# Patient Record
Sex: Female | Born: 1935
Health system: Southern US, Community
[De-identification: ages and names within clinical notes are randomized; demographics above are authoritative.]

## PROBLEM LIST (undated history)

## (undated) DIAGNOSIS — Z5189 Encounter for other specified aftercare: Secondary | ICD-10-CM

## (undated) DIAGNOSIS — F0280 Dementia in other diseases classified elsewhere without behavioral disturbance: Secondary | ICD-10-CM

## (undated) DIAGNOSIS — H409 Unspecified glaucoma: Secondary | ICD-10-CM

## (undated) DIAGNOSIS — I739 Peripheral vascular disease, unspecified: Secondary | ICD-10-CM

## (undated) DIAGNOSIS — I82409 Acute embolism and thrombosis of unspecified deep veins of unspecified lower extremity: Secondary | ICD-10-CM

## (undated) DIAGNOSIS — N189 Chronic kidney disease, unspecified: Secondary | ICD-10-CM

## (undated) DIAGNOSIS — K8689 Other specified diseases of pancreas: Secondary | ICD-10-CM

## (undated) DIAGNOSIS — E785 Hyperlipidemia, unspecified: Secondary | ICD-10-CM

## (undated) DIAGNOSIS — K5790 Diverticulosis of intestine, part unspecified, without perforation or abscess without bleeding: Secondary | ICD-10-CM

## (undated) DIAGNOSIS — K219 Gastro-esophageal reflux disease without esophagitis: Secondary | ICD-10-CM

## (undated) DIAGNOSIS — M1990 Unspecified osteoarthritis, unspecified site: Secondary | ICD-10-CM

## (undated) DIAGNOSIS — D126 Benign neoplasm of colon, unspecified: Secondary | ICD-10-CM

## (undated) DIAGNOSIS — K449 Diaphragmatic hernia without obstruction or gangrene: Secondary | ICD-10-CM

## (undated) DIAGNOSIS — J45909 Unspecified asthma, uncomplicated: Secondary | ICD-10-CM

## (undated) DIAGNOSIS — R918 Other nonspecific abnormal finding of lung field: Secondary | ICD-10-CM

## (undated) DIAGNOSIS — K222 Esophageal obstruction: Secondary | ICD-10-CM

## (undated) DIAGNOSIS — G309 Alzheimer's disease, unspecified: Secondary | ICD-10-CM

## (undated) DIAGNOSIS — D649 Anemia, unspecified: Secondary | ICD-10-CM

## (undated) DIAGNOSIS — I10 Essential (primary) hypertension: Secondary | ICD-10-CM

## (undated) HISTORY — DX: Other specified diseases of pancreas: K86.89

## (undated) HISTORY — DX: Anemia, unspecified: D64.9

## (undated) HISTORY — PX: CATARACT EXTRACTION, BILATERAL: SHX1313

## (undated) HISTORY — DX: Encounter for other specified aftercare: Z51.89

## (undated) HISTORY — DX: Alzheimer's disease, unspecified: G30.9

## (undated) HISTORY — PX: EYE SURGERY: SHX253

## (undated) HISTORY — DX: Gastro-esophageal reflux disease without esophagitis: K21.9

## (undated) HISTORY — DX: Esophageal obstruction: K22.2

## (undated) HISTORY — DX: Diverticulosis of intestine, part unspecified, without perforation or abscess without bleeding: K57.90

## (undated) HISTORY — DX: Chronic kidney disease, unspecified: N18.9

## (undated) HISTORY — DX: Unspecified osteoarthritis, unspecified site: M19.90

## (undated) HISTORY — DX: Other nonspecific abnormal finding of lung field: R91.8

## (undated) HISTORY — DX: Acute embolism and thrombosis of unspecified deep veins of unspecified lower extremity: I82.409

## (undated) HISTORY — DX: Dementia in other diseases classified elsewhere, unspecified severity, without behavioral disturbance, psychotic disturbance, mood disturbance, and anxiety: F02.80

## (undated) HISTORY — DX: Peripheral vascular disease, unspecified: I73.9

## (undated) HISTORY — PX: WRIST FRACTURE SURGERY: SHX121

## (undated) HISTORY — DX: Essential (primary) hypertension: I10

## (undated) HISTORY — DX: Benign neoplasm of colon, unspecified: D12.6

## (undated) HISTORY — DX: Diaphragmatic hernia without obstruction or gangrene: K44.9

## (undated) HISTORY — DX: Hyperlipidemia, unspecified: E78.5

---

## 2003-12-18 ENCOUNTER — Inpatient Hospital Stay (HOSPITAL_COMMUNITY): Admission: AD | Admit: 2003-12-18 | Discharge: 2004-01-04 | Payer: Self-pay | Admitting: Sports Medicine

## 2004-01-04 ENCOUNTER — Inpatient Hospital Stay (HOSPITAL_COMMUNITY)
Admission: RE | Admit: 2004-01-04 | Discharge: 2004-01-20 | Payer: Self-pay | Admitting: Physical Medicine & Rehabilitation

## 2004-01-09 ENCOUNTER — Encounter: Admission: RE | Admit: 2004-01-09 | Discharge: 2004-01-09 | Payer: Self-pay | Admitting: Family Medicine

## 2004-01-24 ENCOUNTER — Encounter
Admission: RE | Admit: 2004-01-24 | Discharge: 2004-04-23 | Payer: Self-pay | Admitting: Physical Medicine & Rehabilitation

## 2004-02-03 ENCOUNTER — Encounter: Admission: RE | Admit: 2004-02-03 | Discharge: 2004-02-03 | Payer: Self-pay | Admitting: Sports Medicine

## 2004-02-10 ENCOUNTER — Encounter: Admission: RE | Admit: 2004-02-10 | Discharge: 2004-02-10 | Payer: Self-pay | Admitting: Sports Medicine

## 2004-02-19 ENCOUNTER — Encounter (INDEPENDENT_AMBULATORY_CARE_PROVIDER_SITE_OTHER): Payer: Self-pay | Admitting: *Deleted

## 2004-02-21 ENCOUNTER — Encounter: Admission: RE | Admit: 2004-02-21 | Discharge: 2004-05-21 | Payer: Self-pay | Admitting: Family Medicine

## 2004-02-21 ENCOUNTER — Encounter
Admission: RE | Admit: 2004-02-21 | Discharge: 2004-05-21 | Payer: Self-pay | Admitting: Physical Medicine & Rehabilitation

## 2004-03-12 ENCOUNTER — Encounter: Admission: RE | Admit: 2004-03-12 | Discharge: 2004-03-12 | Payer: Self-pay | Admitting: Family Medicine

## 2004-04-13 ENCOUNTER — Encounter
Admission: RE | Admit: 2004-04-13 | Discharge: 2004-04-13 | Payer: Self-pay | Admitting: Physical Medicine & Rehabilitation

## 2004-04-24 ENCOUNTER — Encounter
Admission: RE | Admit: 2004-04-24 | Discharge: 2004-07-23 | Payer: Self-pay | Admitting: Physical Medicine & Rehabilitation

## 2004-05-07 ENCOUNTER — Encounter (HOSPITAL_BASED_OUTPATIENT_CLINIC_OR_DEPARTMENT_OTHER): Admission: RE | Admit: 2004-05-07 | Discharge: 2004-08-05 | Payer: Self-pay | Admitting: Internal Medicine

## 2004-05-29 ENCOUNTER — Encounter
Admission: RE | Admit: 2004-05-29 | Discharge: 2004-06-29 | Payer: Self-pay | Admitting: Physical Medicine & Rehabilitation

## 2004-06-29 ENCOUNTER — Encounter
Admission: RE | Admit: 2004-06-29 | Discharge: 2004-08-30 | Payer: Self-pay | Admitting: Physical Medicine & Rehabilitation

## 2004-07-03 ENCOUNTER — Ambulatory Visit: Payer: Self-pay | Admitting: Physical Medicine & Rehabilitation

## 2004-07-05 ENCOUNTER — Encounter (HOSPITAL_BASED_OUTPATIENT_CLINIC_OR_DEPARTMENT_OTHER): Admission: RE | Admit: 2004-07-05 | Discharge: 2004-09-28 | Payer: Self-pay | Admitting: Internal Medicine

## 2004-07-30 ENCOUNTER — Ambulatory Visit: Payer: Self-pay | Admitting: Family Medicine

## 2004-08-09 ENCOUNTER — Encounter (HOSPITAL_BASED_OUTPATIENT_CLINIC_OR_DEPARTMENT_OTHER): Admission: RE | Admit: 2004-08-09 | Discharge: 2004-11-07 | Payer: Self-pay | Admitting: Internal Medicine

## 2004-08-29 ENCOUNTER — Ambulatory Visit: Payer: Self-pay | Admitting: Family Medicine

## 2004-09-17 ENCOUNTER — Ambulatory Visit (HOSPITAL_COMMUNITY): Admission: RE | Admit: 2004-09-17 | Discharge: 2004-09-17 | Payer: Self-pay

## 2004-09-20 HISTORY — PX: VASCULAR SURGERY: SHX849

## 2004-10-04 ENCOUNTER — Inpatient Hospital Stay (HOSPITAL_COMMUNITY): Admission: RE | Admit: 2004-10-04 | Discharge: 2004-10-11 | Payer: Self-pay | Admitting: Vascular Surgery

## 2004-11-27 ENCOUNTER — Ambulatory Visit: Payer: Self-pay | Admitting: Sports Medicine

## 2005-03-21 DIAGNOSIS — I82409 Acute embolism and thrombosis of unspecified deep veins of unspecified lower extremity: Secondary | ICD-10-CM

## 2005-03-21 HISTORY — DX: Acute embolism and thrombosis of unspecified deep veins of unspecified lower extremity: I82.409

## 2005-04-16 ENCOUNTER — Ambulatory Visit: Payer: Self-pay | Admitting: Sports Medicine

## 2005-04-17 ENCOUNTER — Inpatient Hospital Stay (HOSPITAL_COMMUNITY): Admission: RE | Admit: 2005-04-17 | Discharge: 2005-04-20 | Payer: Self-pay | Admitting: Vascular Surgery

## 2005-08-26 ENCOUNTER — Ambulatory Visit: Payer: Self-pay | Admitting: Family Medicine

## 2005-10-01 ENCOUNTER — Encounter: Admission: RE | Admit: 2005-10-01 | Discharge: 2005-10-01 | Payer: Self-pay | Admitting: Sports Medicine

## 2006-01-16 ENCOUNTER — Ambulatory Visit: Payer: Self-pay | Admitting: Family Medicine

## 2006-02-06 ENCOUNTER — Ambulatory Visit: Payer: Self-pay | Admitting: Family Medicine

## 2006-04-08 ENCOUNTER — Ambulatory Visit: Payer: Self-pay | Admitting: Sports Medicine

## 2006-04-11 ENCOUNTER — Ambulatory Visit: Payer: Self-pay | Admitting: Family Medicine

## 2006-04-16 ENCOUNTER — Ambulatory Visit: Payer: Self-pay | Admitting: Family Medicine

## 2006-05-14 ENCOUNTER — Ambulatory Visit: Payer: Self-pay | Admitting: Sports Medicine

## 2006-07-07 ENCOUNTER — Encounter: Admission: RE | Admit: 2006-07-07 | Discharge: 2006-07-07 | Payer: Self-pay | Admitting: Family Medicine

## 2006-07-09 ENCOUNTER — Ambulatory Visit: Payer: Self-pay | Admitting: Family Medicine

## 2006-07-23 ENCOUNTER — Ambulatory Visit: Payer: Self-pay | Admitting: Family Medicine

## 2006-09-22 ENCOUNTER — Ambulatory Visit: Payer: Self-pay | Admitting: Sports Medicine

## 2006-11-26 ENCOUNTER — Encounter: Admission: RE | Admit: 2006-11-26 | Discharge: 2006-11-26 | Payer: Self-pay | Admitting: Sports Medicine

## 2006-12-12 ENCOUNTER — Encounter (INDEPENDENT_AMBULATORY_CARE_PROVIDER_SITE_OTHER): Payer: Self-pay | Admitting: Family Medicine

## 2006-12-18 DIAGNOSIS — M479 Spondylosis, unspecified: Secondary | ICD-10-CM | POA: Insufficient documentation

## 2006-12-18 DIAGNOSIS — E1159 Type 2 diabetes mellitus with other circulatory complications: Secondary | ICD-10-CM

## 2006-12-18 DIAGNOSIS — I10 Essential (primary) hypertension: Secondary | ICD-10-CM

## 2006-12-18 DIAGNOSIS — I70219 Atherosclerosis of native arteries of extremities with intermittent claudication, unspecified extremity: Secondary | ICD-10-CM

## 2006-12-18 DIAGNOSIS — E119 Type 2 diabetes mellitus without complications: Secondary | ICD-10-CM

## 2006-12-18 DIAGNOSIS — N19 Unspecified kidney failure: Secondary | ICD-10-CM | POA: Insufficient documentation

## 2006-12-19 ENCOUNTER — Encounter (INDEPENDENT_AMBULATORY_CARE_PROVIDER_SITE_OTHER): Payer: Self-pay | Admitting: *Deleted

## 2007-01-02 ENCOUNTER — Ambulatory Visit (HOSPITAL_BASED_OUTPATIENT_CLINIC_OR_DEPARTMENT_OTHER): Admission: RE | Admit: 2007-01-02 | Discharge: 2007-01-02 | Payer: Self-pay | Admitting: Urology

## 2007-01-29 ENCOUNTER — Ambulatory Visit: Payer: Self-pay | Admitting: Family Medicine

## 2007-03-10 ENCOUNTER — Telehealth (INDEPENDENT_AMBULATORY_CARE_PROVIDER_SITE_OTHER): Payer: Self-pay | Admitting: Family Medicine

## 2007-04-13 ENCOUNTER — Ambulatory Visit: Payer: Self-pay | Admitting: Sports Medicine

## 2007-04-13 DIAGNOSIS — I872 Venous insufficiency (chronic) (peripheral): Secondary | ICD-10-CM | POA: Insufficient documentation

## 2007-04-14 DIAGNOSIS — R062 Wheezing: Secondary | ICD-10-CM

## 2007-04-15 ENCOUNTER — Ambulatory Visit: Payer: Self-pay | Admitting: Vascular Surgery

## 2007-04-30 ENCOUNTER — Encounter (INDEPENDENT_AMBULATORY_CARE_PROVIDER_SITE_OTHER): Payer: Self-pay | Admitting: Family Medicine

## 2007-07-20 ENCOUNTER — Ambulatory Visit: Payer: Self-pay | Admitting: Sports Medicine

## 2007-07-20 DIAGNOSIS — R1310 Dysphagia, unspecified: Secondary | ICD-10-CM | POA: Insufficient documentation

## 2007-07-20 DIAGNOSIS — M25559 Pain in unspecified hip: Secondary | ICD-10-CM

## 2007-07-20 DIAGNOSIS — R05 Cough: Secondary | ICD-10-CM

## 2007-08-25 ENCOUNTER — Ambulatory Visit: Payer: Self-pay | Admitting: Vascular Surgery

## 2007-09-27 ENCOUNTER — Emergency Department (HOSPITAL_COMMUNITY): Admission: EM | Admit: 2007-09-27 | Discharge: 2007-09-27 | Payer: Self-pay | Admitting: Emergency Medicine

## 2007-09-29 ENCOUNTER — Inpatient Hospital Stay (HOSPITAL_COMMUNITY): Admission: RE | Admit: 2007-09-29 | Discharge: 2007-09-30 | Payer: Self-pay | Admitting: Orthopedic Surgery

## 2007-12-08 ENCOUNTER — Ambulatory Visit: Payer: Self-pay | Admitting: Vascular Surgery

## 2008-03-21 ENCOUNTER — Telehealth: Payer: Self-pay | Admitting: *Deleted

## 2008-05-24 ENCOUNTER — Ambulatory Visit: Payer: Self-pay | Admitting: Vascular Surgery

## 2008-05-25 ENCOUNTER — Encounter (INDEPENDENT_AMBULATORY_CARE_PROVIDER_SITE_OTHER): Payer: Self-pay | Admitting: Family Medicine

## 2008-06-23 ENCOUNTER — Telehealth (INDEPENDENT_AMBULATORY_CARE_PROVIDER_SITE_OTHER): Payer: Self-pay | Admitting: Family Medicine

## 2008-08-18 ENCOUNTER — Encounter: Admission: RE | Admit: 2008-08-18 | Discharge: 2008-08-18 | Payer: Self-pay | Admitting: Family Medicine

## 2008-08-31 ENCOUNTER — Encounter: Admission: RE | Admit: 2008-08-31 | Discharge: 2008-08-31 | Payer: Self-pay | Admitting: Family Medicine

## 2008-11-16 ENCOUNTER — Encounter: Admission: RE | Admit: 2008-11-16 | Discharge: 2008-11-16 | Payer: Self-pay | Admitting: Family Medicine

## 2008-11-16 ENCOUNTER — Other Ambulatory Visit: Admission: RE | Admit: 2008-11-16 | Discharge: 2008-11-16 | Payer: Self-pay | Admitting: Interventional Radiology

## 2008-11-16 ENCOUNTER — Encounter (INDEPENDENT_AMBULATORY_CARE_PROVIDER_SITE_OTHER): Payer: Self-pay | Admitting: Interventional Radiology

## 2008-11-29 ENCOUNTER — Ambulatory Visit: Payer: Self-pay | Admitting: Vascular Surgery

## 2009-03-06 ENCOUNTER — Encounter: Admission: RE | Admit: 2009-03-06 | Discharge: 2009-03-06 | Payer: Self-pay | Admitting: Family Medicine

## 2009-03-10 ENCOUNTER — Encounter: Admission: RE | Admit: 2009-03-10 | Discharge: 2009-03-10 | Payer: Self-pay | Admitting: Family Medicine

## 2009-05-30 ENCOUNTER — Ambulatory Visit: Payer: Self-pay | Admitting: Vascular Surgery

## 2009-07-18 ENCOUNTER — Inpatient Hospital Stay (HOSPITAL_COMMUNITY): Admission: EM | Admit: 2009-07-18 | Discharge: 2009-07-20 | Payer: Self-pay | Admitting: Emergency Medicine

## 2010-08-22 ENCOUNTER — Encounter: Payer: Self-pay | Admitting: Emergency Medicine

## 2010-09-27 ENCOUNTER — Ambulatory Visit: Payer: Self-pay | Admitting: Emergency Medicine

## 2010-09-27 DIAGNOSIS — J45909 Unspecified asthma, uncomplicated: Secondary | ICD-10-CM

## 2010-10-30 ENCOUNTER — Ambulatory Visit
Admission: RE | Admit: 2010-10-30 | Discharge: 2010-10-30 | Payer: Self-pay | Source: Home / Self Care | Attending: Emergency Medicine | Admitting: Emergency Medicine

## 2010-10-30 ENCOUNTER — Encounter: Payer: Self-pay | Admitting: Emergency Medicine

## 2010-10-30 DIAGNOSIS — J309 Allergic rhinitis, unspecified: Secondary | ICD-10-CM | POA: Insufficient documentation

## 2010-11-12 ENCOUNTER — Encounter: Payer: Self-pay | Admitting: Family Medicine

## 2010-11-20 NOTE — Assessment & Plan Note (Signed)
Summary: asthma, cough   Visit Type:  Initial Consult Copy to:  Dr. Nadyne Coombes Primary Provider/Referring Provider:  Dr. Nadyne Coombes  CC:  Asthma.  History of Present Illness: 77 yo woman, never smoker, DM, CKD Stage 3, HTN. Dx with childhood asthma, not on scheduled meds at that time. Improved and then returned as a young adult. She has always been on as needed therapies, has not required scheduled meds. Over the last year or so, she has had more congestion, wheeze, cough, no real assoc dyspnea. Phlegm was light colored. Overall energy level a bit down, usually very active. Tearful today when she talks about being limited. Her only inhaler was Primatine mist. Saw Dr Hal Hope and started pulmicort + ProAir. Not on allergy regimen.    Triggers: feathers, pollen, leaves, smoke.   Current Medications (verified): 1)  Aspirin 325 Mg Tabs (Aspirin) .Marland Kitchen.. 1 Once Daily 2)  Norvasc 5 Mg Tabs (Amlodipine Besylate) .... One Daily 3)  Tricor 48 Mg Tabs (Fenofibrate) .... Take 1 Tablet By Mouth Once A Day 4)  Amaryl 1 Mg Tabs (Glimepiride) .Marland Kitchen.. 1 Two Times A Day 5)  Calcium Plus Vitamin D3 600-500 Mg-Unit Caps (Calcium Carb-Cholecalciferol) .Marland Kitchen.. 1 Once Daily 6)  Multivital   Tabs (Multiple Vitamins-Minerals) .Marland Kitchen.. 1 Once Daily 7)  Benazepril Hcl 40 Mg  Tabs (Benazepril Hcl) .... 1/2 Two Times A Day 8)  Pulmicort Flexhaler 180 Mcg/act Aepb (Budesonide) .Marland Kitchen.. 1 Puff At Bedtime 9)  Famotidine 20 Mg Tabs (Famotidine) .Marland Kitchen.. 1 Two Times A Day As Needed 10)  Benzonatate 100 Mg Caps (Benzonatate) .Marland Kitchen.. 1 Three Times A Day As Needed 11)  Metoprolol Succinate 50 Mg Xr24h-Tab (Metoprolol Succinate) .Marland Kitchen.. 1 Once Daily 12)  Insta-Glucose 40 % Gel (Dextrose (Diabetic Use)) .... As Directed As Needed 13)  Vitamin C 1000 Mg Tabs (Ascorbic Acid) .... 2 Once Daily 14)  Albuterol Sulfate (2.5 Mg/52ml) 0.083% Nebu (Albuterol Sulfate) .Marland Kitchen.. 1 Vial in Nebulizer Once Daily As Needed  Allergies (verified): No Known Drug  Allergies  Past History:  Past Medical History: Last updated: 07/20/2007 hydronephrosis, referred to nephrology 11/2006 & urology - Per urology, no stents needed. Pt followed by Dr Kathrene Bongo for likely diabetic nephropathy and possibly renal vascular disease.  Left DM retinopathy/ Bil posterior vitreous detach Neisseria bacteremia  3/05 Pulmonary nodule  4/05 --> FU CT 2007 showed stable appearance of all nodules (likely scar tissues) SCr = 1.84 (04/2006), 1.46 (07/2006), 1.5 (09/2006) venous insufficiency  Family History: Last updated: 04/13/2007 Pt adopted and unsure of fam hx testicular CA- son  Past Surgical History: Reviewed history from 12/18/2006 and no changes required. Head CT - 12/20/2003, MRI of C, T, & L-spine - 12/20/2003, pelvic U/s - 03/12/2004, R Fem-tib bypass (revision 7/06) - 09/20/2004  Family History: Reviewed history from 04/13/2007 and no changes required. Pt adopted and unsure of fam hx testicular CA- son  Social History: Reviewed history from 07/20/2007 and no changes required. Married. 4 children, 11 grandchildren. Husband is owner of [employer].  Very active with family.  Out of town a lot; goes to her different homes in town and in [place]. Never smoker No ETOH  Vital Signs:  Patient profile:   77 year old female Weight:      148 pounds BMI:     24.91 O2 Sat:      94 % on Room air Temp:     97.9 degrees F oral Pulse rate:   62 / minute BP sitting:  124 / 64  (left arm)  Vitals Entered By: Vernie Murders (September 27, 2010 11:34 AM)  O2 Flow:  Room air  Physical Exam  General:  normal appearance, healthy appearing, and thin.   Head:  normocephalic and atraumatic Eyes:  conjunctiva and sclera clear Nose:  no deformity, discharge, inflammation, or lesions Mouth:  crowded post pharynx, some erythema Neck:  no masses, thyromegaly, or abnormal cervical nodes Lungs:  clear bilaterally to auscultation and percussion Heart:  regular rate  and rhythm, S1, S2 without murmurs, rubs, gallops, or clicks Abdomen:  not examined Extremities:  1+ ankle edema Neurologic:  non-focal Psych:  alert and cooperative; normal mood and affect; normal attention span and concentration, a bit forgetful w history giving   Impression & Recommendations:  Problem # 1:  ASTHMA, UNSPECIFIED, UNSPECIFIED STATUS (ICD-493.90) Longstanding mild intermittant asthma, has been more bothersome for last yr esp last few months. Biggest symptom if cough, mucous. ? whether this is her asthma vs UA irritation. She clinically improved when pulmicort added, has not needed ProAir - continue pulmicort for now, may be able to peel off if we address exacerbating factors espec her allergies.  - spirometry on stable regimen next time - as needed ProAir  Problem # 2:  COUGH (ICD-786.2)  Again, ? her asthma or her upper airway. Possible contributors - allergies and PND, benazepril. She didn';t report GERD.  - rx allergies w loratadine and nasonex, follow signs - ? whether we can peel off the pulmicort if we manage the allergies more agressively  Orders: Consultation Level IV (01027)  Medications Added to Medication List This Visit: 1)  Aspirin 325 Mg Tabs (Aspirin) .Marland Kitchen.. 1 once daily 2)  Amaryl 1 Mg Tabs (Glimepiride) .Marland Kitchen.. 1 two times a day 3)  Calcium Plus Vitamin D3 600-500 Mg-unit Caps (Calcium carb-cholecalciferol) .Marland Kitchen.. 1 once daily 4)  Multivital Tabs (Multiple vitamins-minerals) .Marland Kitchen.. 1 once daily 5)  Benazepril Hcl 40 Mg Tabs (Benazepril hcl) .... 1/2 two times a day 6)  Pulmicort Flexhaler 180 Mcg/act Aepb (Budesonide) .Marland Kitchen.. 1 puff at bedtime 7)  Famotidine 20 Mg Tabs (Famotidine) .Marland Kitchen.. 1 two times a day as needed 8)  Benzonatate 100 Mg Caps (Benzonatate) .Marland Kitchen.. 1 three times a day as needed 9)  Metoprolol Succinate 50 Mg Xr24h-tab (Metoprolol succinate) .Marland Kitchen.. 1 once daily 10)  Insta-glucose 40 % Gel (Dextrose (diabetic use)) .... As directed as needed 11)   Vitamin C 1000 Mg Tabs (Ascorbic acid) .... 2 once daily 12)  Albuterol Sulfate (2.5 Mg/59ml) 0.083% Nebu (Albuterol sulfate) .Marland Kitchen.. 1 vial in nebulizer once daily as needed 13)  Loratadine 10 Mg Tabs (Loratadine) .Marland Kitchen.. 1 by mouth once daily  Patient Instructions: 1)  Continue your Pulmicort once daily. we may decide to stop this at some point in the future 2)  Have ProAir available to use as needed  3)  Start loratadine 10mg  by mouth once daily (Claritin) 4)  Start Nasonex 2 sprays each nostril once daily  5)  Follow up with Dr Delton Coombes in 1 month. We will perform in-office spirometry at that time.  Prescriptions: LORATADINE 10 MG TABS (LORATADINE) 1 by mouth once daily  #30 x 11   Entered and Authorized by:   Leslye Peer MD   Signed by:   Leslye Peer MD on 09/27/2010   Method used:   Electronically to        [facility]* (retail)       [address]  [place], [place]  [id number]       Ph: [phone]       Fax: [phone]   RxID:   [id number]

## 2010-11-22 NOTE — Assessment & Plan Note (Signed)
Summary: asthma, allergies   Visit Type:  Follow-up Copy to:  Dr. Nadyne Coombes Primary Provider/Referring Provider:  Dr. Nadyne Coombes  CC:  Asthma.Marland KitchenMarland KitchenCough...patient says her breathing has improved since on Pulmicort.  History of Present Illness: 77 yo woman, never smoker, DM, CKD Stage 3, HTN. Dx with childhood asthma, not on scheduled meds at that time. Improved and then returned as a young adult. She has always been on as needed therapies, has not required scheduled meds. Over the last year or so, she has had more congestion, wheeze, cough, no real assoc dyspnea. Phlegm was light colored. Overall energy level a bit down, usually very active. Tearful today when she talks about being limited. Her only inhaler was Primatine mist. Saw Dr Hal Hope and started pulmicort + ProAir. Not on allergy regimen.  Still on benezapril  Triggers: feathers, pollen, leaves, smoke.   ROV 10/30/10 -- follow up for asthma. She has been started on Pulmicort prior to last time. We started loratadine and nasonex last visit. Her allergy symptoms are better, less drainage and cough. FEV1 1.0 L today  Preventive Screening-Counseling & Management  Alcohol-Tobacco     Smoking Status: never  Current Medications (verified): 1)  Aspirin 325 Mg Tabs (Aspirin) .Marland Kitchen.. 1 Once Daily 2)  Norvasc 5 Mg Tabs (Amlodipine Besylate) .... One Daily 3)  Tricor 48 Mg Tabs (Fenofibrate) .... Take 1 Tablet By Mouth Once A Day 4)  Amaryl 1 Mg Tabs (Glimepiride) .Marland Kitchen.. 1 Two Times A Day 5)  Calcium Plus Vitamin D3 600-500 Mg-Unit Caps (Calcium Carb-Cholecalciferol) .Marland Kitchen.. 1 Once Daily 6)  Multivital   Tabs (Multiple Vitamins-Minerals) .Marland Kitchen.. 1 Once Daily 7)  Benazepril Hcl 40 Mg  Tabs (Benazepril Hcl) .... 1/2 Two Times A Day 8)  Pulmicort Flexhaler 180 Mcg/act Aepb (Budesonide) .Marland Kitchen.. 1 Puff At Bedtime 9)  Famotidine 20 Mg Tabs (Famotidine) .Marland Kitchen.. 1 Two Times A Day As Needed 10)  Benzonatate 100 Mg Caps (Benzonatate) .Marland Kitchen.. 1 Three Times A Day As  Needed 11)  Metoprolol Succinate 50 Mg Xr24h-Tab (Metoprolol Succinate) .Marland Kitchen.. 1 Once Daily 12)  Insta-Glucose 40 % Gel (Dextrose (Diabetic Use)) .... As Directed As Needed 13)  Vitamin C 1000 Mg Tabs (Ascorbic Acid) .... 2 Once Daily 14)  Albuterol Sulfate (2.5 Mg/33ml) 0.083% Nebu (Albuterol Sulfate) .Marland Kitchen.. 1 Vial in Nebulizer Once Daily As Needed 15)  Loratadine 10 Mg Tabs (Loratadine) .Marland Kitchen.. 1 By Mouth Once Daily  Allergies (verified): No Known Drug Allergies  Vital Signs:  Patient profile:   77 year old female Height:      64.75 inches (164.47 cm) Weight:      149 pounds (67.73 kg) BMI:     25.08 O2 Sat:      96 % on Room air Temp:     97.4 degrees F (36.33 degrees C) oral Pulse rate:   74 / minute BP sitting:   120 / 78  (left arm) Cuff size:   regular  Vitals Entered By: Michel Bickers CMA (October 30, 2010 2:42 PM)  O2 Sat at Rest %:  96 O2 Flow:  Room air CC: Asthma.Marland KitchenMarland KitchenCough...patient says her breathing has improved since on Pulmicort Comments Medications reviewed with patient Michel Bickers CMA  October 30, 2010 2:42 PM   Physical Exam  General:  normal appearance, healthy appearing, and thin.   Head:  normocephalic and atraumatic Eyes:  conjunctiva and sclera clear Nose:  no deformity, discharge, inflammation, or lesions Mouth:  crowded post pharynx, some erythema Neck:  no masses, thyromegaly,  or abnormal cervical nodes Lungs:  clear bilaterally to auscultation, she does have very soft wheeze on forced expiration Heart:  regular rate and rhythm, S1, S2 without murmurs, rubs, gallops, or clicks Abdomen:  not examined Extremities:  1+ ankle edema Neurologic:  non-focal Psych:  alert and cooperative; normal mood and affect; normal attention span and concentration, a bit forgetful w history giving   Impression & Recommendations:  Problem # 1:  ASTHMA, UNSPECIFIED, UNSPECIFIED STATUS (ICD-493.90)  Problem # 2:  ALLERGIC RHINITIS (ICD-477.9)  Her updated medication list  for this problem includes:    Loratadine 10 Mg Tabs (Loratadine) .Marland Kitchen... 1 by mouth once daily  Orders: Est. Patient Level IV (47829)  Patient Instructions: 1)  Continue your Pulmicort 1 puff at bedtime  2)  Have your ProAir available to use as needed  3)  Stop your Nasonex 4)  Continue the loratadine for th enext month, then try stopping it. If your allergy symptoms return then restart the medication. You may need both loratadine and Nasonex during the Spring and Fall months.  5)  Follow up with Dr Delton Coombes in 1 yr or as needed

## 2011-01-25 LAB — URINALYSIS, ROUTINE W REFLEX MICROSCOPIC
Glucose, UA: >1000 mg/dL — AB
Glucose, UA: >1000 mg/dL — AB
Hgb urine dipstick: NEGATIVE
Ketones, ur: NEGATIVE mg/dL
Ketones, ur: NEGATIVE mg/dL
Nitrite: POSITIVE — AB
Protein, ur: 30 mg/dL — AB
Protein, ur: NEGATIVE mg/dL
Specific Gravity, Urine: 1.021 (ref 1.005–1.030)
pH: 5.5 (ref 5.0–8.0)

## 2011-01-25 LAB — LACTIC ACID, PLASMA: Lactic Acid, Venous: 1.7 mmol/L (ref 0.5–2.2)

## 2011-01-25 LAB — CBC
HCT: 26.8 % — ABNORMAL LOW (ref 36.0–46.0)
HCT: 33.8 % — ABNORMAL LOW (ref 36.0–46.0)
Hemoglobin: 11.3 g/dL — ABNORMAL LOW (ref 12.0–15.0)
Hemoglobin: 8.9 g/dL — ABNORMAL LOW (ref 12.0–15.0)
MCHC: 33.4 g/dL (ref 30.0–36.0)
MCHC: 33.5 g/dL (ref 30.0–36.0)
Platelets: 342 10*3/uL (ref 150–400)
RBC: 3.49 MIL/uL — ABNORMAL LOW (ref 3.87–5.11)
RDW: 12.9 % (ref 11.5–15.5)

## 2011-01-25 LAB — COMPREHENSIVE METABOLIC PANEL
ALT: 20 U/L (ref 0–35)
BUN: 31 mg/dL — ABNORMAL HIGH (ref 6–23)
Calcium: 9.3 mg/dL (ref 8.4–10.5)
Chloride: 99 mEq/L (ref 96–112)
GFR calc non Af Amer: 20 mL/min — ABNORMAL LOW (ref >60)
Potassium: 4.7 mEq/L (ref 3.5–5.1)
Sodium: 131 mEq/L — ABNORMAL LOW (ref 135–145)
Total Bilirubin: 0.8 mg/dL (ref 0.3–1.2)
Total Protein: 6.7 g/dL (ref 6.0–8.3)

## 2011-01-25 LAB — GLUCOSE, CAPILLARY
Glucose-Capillary: 127 mg/dL — ABNORMAL HIGH (ref 70–99)
Glucose-Capillary: 155 mg/dL — ABNORMAL HIGH (ref 70–99)
Glucose-Capillary: 220 mg/dL — ABNORMAL HIGH (ref 70–99)
Glucose-Capillary: 271 mg/dL — ABNORMAL HIGH (ref 70–99)
Glucose-Capillary: 303 mg/dL — ABNORMAL HIGH (ref 70–99)
Glucose-Capillary: 79 mg/dL (ref 70–99)
Glucose-Capillary: 82 mg/dL (ref 70–99)
Glucose-Capillary: 92 mg/dL (ref 70–99)

## 2011-01-25 LAB — CULTURE, BLOOD (ROUTINE X 2)

## 2011-01-25 LAB — URINE MICROSCOPIC-ADD ON

## 2011-01-25 LAB — LEGIONELLA ANTIGEN, URINE

## 2011-01-25 LAB — BASIC METABOLIC PANEL
CO2: 27 mEq/L (ref 19–32)
Calcium: 8.7 mg/dL (ref 8.4–10.5)
Glucose, Bld: 112 mg/dL — ABNORMAL HIGH (ref 70–99)
Potassium: 4.3 mEq/L (ref 3.5–5.1)
Sodium: 140 mEq/L (ref 135–145)

## 2011-01-25 LAB — CK TOTAL AND CKMB (NOT AT ARMC): CK, MB: 2.3 ng/mL (ref 0.3–4.0)

## 2011-01-25 LAB — RETICULOCYTES: RBC.: 3.19 MIL/uL — ABNORMAL LOW (ref 3.87–5.11)

## 2011-01-25 LAB — URINE CULTURE
Colony Count: 35000
Special Requests: NEGATIVE

## 2011-01-25 LAB — DIFFERENTIAL
Eosinophils Absolute: 0 10*3/uL (ref 0.0–0.7)
Eosinophils Relative: 0 % (ref 0–5)
Lymphocytes Relative: 5 % — ABNORMAL LOW (ref 12–46)

## 2011-01-25 LAB — D-DIMER, QUANTITATIVE: D-Dimer, Quant: 3.28 ug/mL-FEU — ABNORMAL HIGH (ref 0.00–0.48)

## 2011-01-25 LAB — IRON AND TIBC
Iron: 19 ug/dL — ABNORMAL LOW (ref 42–135)
TIBC: 171 ug/dL — ABNORMAL LOW (ref 250–470)

## 2011-01-25 LAB — LIPID PANEL: VLDL: 13 mg/dL (ref 0–40)

## 2011-03-05 NOTE — Procedures (Signed)
BYPASS GRAFT EVALUATION   INDICATION:  Follow-up evaluation of right fem-pop bypass graft.   HISTORY:  Diabetes:  Yes.  Cardiac:  No.  Hypertension:  Yes.  Smoking:  No.  Previous Surgery:  Right fem-pop bypass graft on 04/17/05.   SINGLE LEVEL ARTERIAL EXAM                               RIGHT              LEFT  Brachial:                    124                125  Anterior tibial:             132                68  Posterior tibial:            130                72  Peroneal:  Ankle/brachial index:        1.06               0.58   PREVIOUS ABI:  Date: 05/24/08  RIGHT:  1.07  LEFT:  0.60   LOWER EXTREMITY BYPASS GRAFT DUPLEX EXAM:   DUPLEX:  Patent right fem-pop bypass graft with no evidence of focal  stenosis with biphasic duplex waveforms.   IMPRESSION:  1. Normal right lower extremity ankle brachial index with biphasic      Doppler waveforms.  2. Left lower extremity ankle brachial index suggests moderate      arterial disease with monophasic Doppler waveforms.       ___________________________________________  Di Kindle. Edilia Bo, M.D.   AC/MEDQ  D:  11/29/2008  T:  11/29/2008  Job:  [id number]

## 2011-03-05 NOTE — Procedures (Signed)
BYPASS GRAFT EVALUATION   INDICATION:  Followup evaluation of right fem-pop bypass graft.   HISTORY:  Diabetes:  Yes.  Cardiac:  No.  Hypertension:  Yes.  Smoking:  No.  Previous Surgery:  Right fem to tibial peroneal trunk bypass graft with  a revision of the distal portion by saphenous vein interposition graft  on April 17, 2005.   SINGLE LEVEL ARTERIAL EXAM                               RIGHT              LEFT  Brachial:                    118                119  Anterior tibial:             129                76  Posterior tibial:            142                79  Peroneal:  Ankle/brachial index:        >1.0               0.66   PREVIOUS ABI:  Date: April 15, 2007  RIGHT:  >1.0  LEFT:  0.63   LOWER EXTREMITY BYPASS GRAFT DUPLEX EXAM:   DUPLEX:  Doppler arterial waveforms are biphasic proximal to, within and  distal to the bypass graft.   IMPRESSION:  1. Patent right fem to tibial peroneal trunk bypass graft.  2. ABIs are stable from previous study bilaterally.   ___________________________________________  Di Kindle. Edilia Bo, M.D.   MC/MEDQ  D:  12/08/2007  T:  12/09/2007  Job:  [id number]

## 2011-03-05 NOTE — Procedures (Signed)
BYPASS GRAFT EVALUATION   INDICATION:  Follow up right lower extremity bypass graft, stable  minimal left claudication.   HISTORY:  Diabetes:  Yes.  Cardiac:  No.  Hypertension:  Yes.  Smoking:  No.  Previous Surgery:  Right femoral-tibial peroneal trunk bypass graft with  revision of distal portion by interposition graft on 04/17/05.   SINGLE LEVEL ARTERIAL EXAM                               RIGHT              LEFT  Brachial:                    104                114  Anterior tibial:             138                68  Posterior tibial:            137                69  Peroneal:  Ankle/brachial index:        1.21               0.61   PREVIOUS ABI:  Date: 11/29/08  RIGHT:  1.06  LEFT:  0.58   LOWER EXTREMITY BYPASS GRAFT DUPLEX EXAM:   DUPLEX:  Doppler arterial waveforms appear triphasic/biphasic proximal  to, within, and distal to right lower extremity bypass graft.   IMPRESSION:  1. Patent right femoral-tibial peroneal trunk bypass graft.  2. Stable ankle brachial indices bilaterally.        ___________________________________________  Di Kindle. Edilia Bo, M.D.   AS/MEDQ  D:  05/30/2009  T:  05/30/2009  Job:  [id number]

## 2011-03-05 NOTE — Consult Note (Signed)
[name]                ACCOUNT NO.:  [id number]   MEDICAL RECORD NO.:  [id number]          PATIENT TYPE:  EMS   LOCATION:  MAJO                         FACILITY:  [facility]   PHYSICIAN:  Dionne Ano. Gramig III, M.D.DATE OF BIRTH:  01/24/1931   DATE OF CONSULTATION:  DATE OF DISCHARGE:  09/27/2007                                 CONSULTATION   I had the pleasure to see [name] today in the [facility] emergency  room.  [name] is a 76-year female who sustained an injury last  evening in [place], [place].  [name] underwent initial stabilization  and a splint was applied.  The patient has since that time arrived back  to the [place] area and desired orthopedic treatment as was  recommended by the emergency staff in [place], [place].  [name] denies  history of inflammatory arthritis, gout, pseudogout or other problems.  This was a same-level fall without loss of consciousness.   PAST MEDICAL HISTORY:  1. Diabetes.  2. Peripheral vascular disease.  3. Stage 3 renal disease.   PAST SURGICAL HISTORY:  1. Vascular reconstruction right lower extremity.  2. Left groin surgery.   ALLERGIES:  None.   MEDICATIONS:  TriCor, Toprol-XL, cyclobenzaprine, vitamin C, Norvasc,  calcium.   SOCIAL HISTORY:  [name] does not smoke or drink.  [name] lives with her  husband of many years.   EXAMINATION:  The patient is alert and oriented.  Vital signs are  stable.  The patient has full range of motion about the shoulders.  Her  neck is nontender.  Her right lower extremity and left lower extremity  are atraumatic without abnormality.   The patient's right upper extremity has positive pulse.  No evidence of  dystrophic reaction or compartment syndrome.  [name] has obvious deformity  and a significant amount of soft tissue swelling dorsally.  I have  reviewed this at length.  X-rays from [place] show a displaced comminuted  intra-articular metaphyseal fracture about the distal radius  with  associated ulnar styloid fracture.   IMPRESSION:  Significantly displaced distal radius fracture, right upper  extremity.   PLAN:  I have discussed with her the findings.  At the present time we  have consented her for closed reduction.   [name] was taken to the procedural suite and underwent a hematoma block  with 10 mL of lidocaine, and then underwent a manipulative closed  reduction without difficulty.  The patient tolerated this quite well and  there were no complicating features.  Postreduction x-rays looked  excellent.   I did discuss with her that in my opinion, given the severity of her  initial presentation and comminution, that I would certainly recommend a  decision towards ORIF with bone grafting, EPL decompression, and PIN  neurectomy as necessary.  I have discussed with the patient the risks  and benefits of surgery including risk of infection, bleeding,  anesthesia, damage to normal structures and failure of surgery to  accomplish the intended goals of relieving symptoms and restoring  function.  Given the significant findings and the problems associated  with malunion, [name] is an excellent surgical candidate.  [name] is right-  hand dominant, lives an active lifestyle and would like to have surgical  intervention to prevent deformity and problems in the future.   Risks and benefits were discussed at length including bleeding,  infection, anesthesia, damage to normal structures and failure of  surgery to accomplish the intended goals of relieving the symptoms and  restoring function.  With this mind, we are going to proceed with  surgical stabilization into the future.  I have discussed with her these  issues at length.   At the present time [name] was asked to elevate, move fingers frequently,  and was discharged home on Vicodin, Robaxin, Peri-Colace, vitamin C and  postreduction protocol.  All questions have been encouraged and answered  and will proceed accordingly  with operative stabilization as necessary.           ______________________________  Dionne Ano. Everlene Other, M.D.     Nash Mantis  D:  09/27/2007  T:  09/28/2007  Job:  [id number]   cc:   Morley Kos, M.D.

## 2011-03-05 NOTE — Op Note (Signed)
[name], [name]                ACCOUNT NO.:  [id number]   MEDICAL RECORD NO.:  [id number]          PATIENT TYPE:  OIB   LOCATION:  5011                         FACILITY:  [facility]   PHYSICIAN:  [name], M.D.DATE OF BIRTH:  1931-03-17   DATE OF PROCEDURE:  09/29/2007  DATE OF DISCHARGE:                               OPERATIVE REPORT   PREOPERATIVE DIAGNOSIS:  Comminuted complex interarticular distal radius  fracture greater than five parts associated ulnar styloid fracture and  metaphyseal comminution.   POSTOPERATIVE DIAGNOSIS:  Comminuted complex interarticular distal  radius fracture greater than five parts associated ulnar styloid  fracture and metaphyseal comminution.   PROCEDURE:  1. Right distal radius open reduction internal fixation with DVR plate      and screw construct as well as allograft bone grafting utilizing a      [employer] distal radius set.  2. Posterior interosseous nerve neurectomy, right wrist.  3. Extensor pollicis longus decompression and transposition, right      wrist.  4. Stress radiography, right wrist.  5. Closed treatment ulnar styloid fracture, right wrist.   SURGEON:  Megan Ano. Amanda Pea, M.D.   ASSISTANT:  Karie Chimera.   COMPLICATIONS:  None.   ANESTHESIA:  General.   TOURNIQUET TIME:  Less than hour.   INDICATIONS FOR PROCEDURE:  This patient presents with the above-  mentioned diagnosis.  She has a distal radius fracture with progressive  collapse greater than five parts.  She had undergone provisional  reduction but given the comminution, etc., we plan to proceed with ORIF  for stabilization purposes.  I have discussed the risks and benefits she  desires to proceed.   OPERATIVE PROCEDURE IN DETAIL:  The patient seen by myself and  anesthesia.  She was taken to operative suite and underwent smooth  induction of general anesthesia, permit was signed.  Arm was marked.  Preoperative antibiotics were given.   Following this she was placed  supine, appropriately padded, prepped and draped in a sterile fashion  Betadine scrub and paint about the right upper extremity.  Following  this the patient then underwent a curvilinear volar radial incision.  Dissection was carried down, FCR tendon sheath was incised and released  dorsally and palmarly.  Carpal canal contents were retracted ulnarly.  I  incised the pronator and retracted this in radial to ulnar direction.  Following this the patient underwent a reduction of the fracture and  application of a DVR plate in standard AO technique.  This looked  excellent AP and lateral plane.  I was able to achieve excellent  fixation.  She had a large dorsal V defect and we planned bone grafting  from a dorsal approach.   Following this, I then approached the wrist dorsally.  Incision was  made.  EPL tendon was incised and had significant blood and debris about  the sheath.  This was thus transferred to the dorsal soft tissues.  Following the EPL tendon transfer, I then performed release of the  fascial regions dorsally as well as volarly as was done previously.  Once this  was accomplished, the posterior interosseous nerve was  identified and underwent a posterior interosseous nerve neurectomy with  crushing cauterization technique.  Following this the bone graft  dorsally was filled with bone graft.  The bone graft was used in the  form of Actifuse bone graft from [employer].  This filled the  defect nicely and was confirmed on x-ray.  X-rays revealed excellent  position stability and there no complicating features.   Following this, I then performed copious irrigation of both wounds.  The  pronator was closed with Vicryl, subcu volarly was closed with Vicryl  followed by skin being closed with Prolene.  The dorsal skin was closed  with Prolene and a TLS drain was placed volarly to allow for the egress  of fluid.  She had soft compartments, excellent  refill, no complicating  features at the conclusion of her operation.  She will be admitted for  23-hour observation for IV antibiotics, pain management and general  postop observation.  I have discussed the do's and don't's etc. her  family is aware of the plans and postoperative protocol.           ______________________________  Megan Ano. Everlene Other, M.D.     Nash Mantis  D:  09/29/2007  T:  09/30/2007  Job:  [id number]

## 2011-03-05 NOTE — Procedures (Signed)
BYPASS GRAFT EVALUATION   INDICATION:  Follow up right fem-pop bypass graft.   HISTORY:  Diabetes:  Yes.  Cardiac:  No.  Hypertension:  Yes.  Smoking:  None.  Previous Surgery:  Please see above.   SINGLE LEVEL ARTERIAL EXAM                               RIGHT              LEFT  Brachial:                    133                134  Anterior tibial:             144                65  Posterior tibial:            139                81  Peroneal:  Ankle/brachial index:        1.07               0.60   PREVIOUS ABI:  Date: 12/08/07  RIGHT:  >1.0  LEFT:  0.66   LOWER EXTREMITY BYPASS GRAFT DUPLEX EXAM:   DUPLEX:  Patent right fem-pop bypass graft with no evidence of focal  stenosis.   IMPRESSION:  1. Patent right femoropopliteal bypass graft with no evidence of focal      stenosis.  2. Normal ankle brachial index with biphasic Doppler waveform noted in      the right leg.  3. Moderately abnormal ankle brachial index with monophasic Doppler      waveform noted in the left leg.  4. Status post right femoropopliteal bypass graft.   ___________________________________________  Di Kindle. Edilia Bo, M.D.   MG/MEDQ  D:  05/24/2008  T:  05/24/2008  Job:  [id number]

## 2011-03-05 NOTE — Assessment & Plan Note (Signed)
OFFICE VISIT   AZA, DANTES  DOB:  1931/05/20                                       08/25/2007  CHART#:[id number]   I saw the patient in the office today for continued followup of her  peripheral vascular disease.  She had presented with a limb-threatening  ischemia to the right lower extremity and underwent a right femoral to  tibial peroneal trunk bypass with a composite saphenous vein graft.  This later failed from a vein graft stenosis, and she had a right above  the knee to below knee bypass on the right.  She comes in for routine  followup visit.  Since I saw her last, she has had no claudication, rest  pain, or non-healing ulcers.  She has been ambulating without  difficulty.   REVIEW OF SYSTEMS:  CARDIAC:  She has had no chest pain, chest pressure,  palpitations, or arrhythmias.  PULMONARY:  She has had no bronchitis, asthma, or wheezing.   PHYSICAL EXAMINATION:  Blood pressure 150/76, heart rate is 73.  HEENT,  I do not detect any carotid bruits.  There is no cervical  lymphadenopathy.  Lungs are clear bilaterally to auscultation.  On  cardiac exam, she has a regular rate and rhythm.  She has a palpable  femoral and popliteal pulse bilaterally.  Both feet are warm and well-  perfused.  She does not have any ischemic ulcers.   Overall, I am pleased with her progress.  I have encouraged her to stay  as active as possible.  I plan on seeing her back in 1 year.  She knows  to call sooner if she has problems.   Di Kindle. Edilia Bo, M.D.  Electronically Signed   CSD/MEDQ  D:  08/25/2007  T:  08/26/2007  Job:  [id number]

## 2011-03-08 NOTE — Op Note (Signed)
[name], [name]                ACCOUNT NO.:  [id number]   MEDICAL RECORD NO.:  [id number]          PATIENT TYPE:  OIB   LOCATION:  2550                         FACILITY:  [facility]   PHYSICIAN:  Quita Skye. Hart Rochester, M.D.  DATE OF BIRTH:  November 16, 1930   DATE OF PROCEDURE:  04/17/2005  DATE OF DISCHARGE:                                 OPERATIVE REPORT   PREOPERATIVE DIAGNOSIS:  Failing right femoral to tibial peroneal trunk  saphenous vein graft secondary to severe vein graft stenosis secondary to  intimal hyperplasia.   POSTOPERATIVE DIAGNOSIS:  Failing right femoral to tibial peroneal trunk  saphenous vein graft secondary to severe vein graft stenosis secondary to  intimal hyperplasia.   OPERATION:  Right above-the-knee to below the interposition reverse  saphenous vein graft from the left leg replacing a portion of right femoral  to tibioperoneal trunk bypass graft with intraoperative arteriogram.   SURGEON:  Quita Skye. Hart Rochester, M.D.   FIRST ASSISTANT:  Jerold Coombe, P.A.   ANESTHESIA:  General endotracheal.   DESCRIPTION OF PROCEDURE:  The patient was taken to the operating room,  placed in supine position at which time satisfactory general endotracheal  anesthesia was administered.  The right leg and left groin were prepped with  Betadine scrub solution and draped in a routine sterile manner. A medial  incision was made in the distal thigh to the right leg and the functioning  saphenous vein graft was exposed in the above-knee position just distal to  its exit from the adductor canal.  The vein graft was relatively lateral in  position.  It was traced distally as far as possible. The vein graft  stenosis extended over about a 4-5 cm segment directly behind the knee  joint.  A second incision was made through the previous scar below the knee,  and the popliteal fossa was entered.  The popliteal artery and vein were  dissected free.  The saphenous vein graft was exposed between  these two  structures but had no palpable pulse.  It was anastomosed to the  tibioperoneal trunk more distally.  After mobilizing it proximally and  distally, incision was made medially anterior to the medial malleolus, and  the remaining greater saphenous vein in the ankle area was removed.  It was  gently dilated with heparinized saline.  It was not felt to be adequate for  the size of the graft.  Therefore a segment of saphenous vein was removed  from the left groin beginning at the saphenofemoral junction and extending  distally about 8-10 cm.  Branches were ligated for 4-0 and 5-0 silk ties,  divided.  It was removed gently, dilated with heparinized saline and marked  for orientation purposes.  It was an excellent vein.  The patient was then  heparinized.  The saphenous vein graft was then transected as far distally  as possible in the above-knee position just proximal to the area of minimal  hyperplasia.  It was then spatulated, and the new vein was used in a reverse  fashion and  spatulated end-to-end.  The anastomosis was done  with 6-0  Prolene.  A new piece of vein was then tunneled behind the knee and the  similar procedure was done in the below-knee position where the saphenous  vein graft was transected completely just distal to the area of minimal  hyperplasia where the vein was almost completely occluded.  Distal to this,  the vein was 3.4 to 4 mm in size.  The vein was spatulated on both ends and  anastomosed end-to-end with 6-0 Prolene. Clamps were then released.  There  was an excellent pulse and Doppler flow in the vein graft.  Intraoperative  arteriogram revealed a widely patent interposition vein graft, replacing the  segment with tibioperoneal trunk anastomosis distally and two-vessel runoff  via posterior tibial and peroneal  arteries.  Protamine was given to reverse the heparin.  Following that  hemostasis, the wounds were all irrigated with saline and closed in  layers  with Vicryl in a subcuticular fashion with Steri-Strips. Sterile dressing  applied.  The patient was taken to recovery in satisfactory condition.       JDL/MEDQ  D:  04/17/2005  T:  04/17/2005  Job:  [id number]

## 2011-03-08 NOTE — Discharge Summary (Signed)
NAMEDARSHANA, CURNUTT                ACCOUNT NO.:  [id number]   MEDICAL RECORD NO.:  [id number]          PATIENT TYPE:  INP   LOCATION:  2004                         FACILITY:  [facility]   PHYSICIAN:  Di Kindle. Edilia Bo, M.D.DATE OF BIRTH:  [birth year]   DATE OF ADMISSION:  10/04/2004  DATE OF DISCHARGE:                                 DISCHARGE SUMMARY   REFERRING PHYSICIAN:  Sherrine Maples T. Fredia Sorrow, M.D.   ADMISSION DIAGNOSIS:  Nonhealing wound of the right heel.   PAST MEDICAL HISTORY AND DISCHARGE DIAGNOSES:  1.  Non-insulin-dependent diabetes mellitus.  2.  Hypertension.  3.  Nonhealing wound of the right heel, status post aggressive outpatient      therapy, status post right femoral to tibial peroneal trunk bypass with      composite saphenous vein.   ALLERGIES:  No known drug allergies.   HISTORY OF PRESENT ILLNESS:  This is a 77 year old female who had been  hospitalized from December 18, 2003, until January 20, 2004, with pneumonia.  During that hospitalization, the patient developed a heel decubitus on the  right.  She has undergone aggressive outpatient therapy including  hydrotherapy and dressing changes, however, the wound has not shown  significant improvement.  Prior to developing heel decubitus, the patient  admitted to some right-sided calf claudication with no symptoms in the left  leg.  She denied any history of rest pain, however.  The patient's work-up  for the nonhealing ulcer included ABIs which were performed on August 30, 2004, and showed an ABI of 38% on the right and 75% on the left.  There were  monophasic Doppler signals present in the right foot.  This prompted an  arteriogram which was performed by Dr. Fredia Sorrow and revealed a superficial  femoral artery occlusion on the right with reconstitution of the popliteal  artery and two vessel runoff on the right.  Patient was subsequently  referred to Dr. Edilia Bo of [facility] regarding revascularization options.   After  evaluating the patient it was Dr. Adele Dan opinion that the patient should  undergo revascularization procedure and that a wound VAC should be placed on  the heel postoperatively to facilitate heeling.   HOSPITAL COURSE:  Patient was admitted and taken to the OR on October 04, 2004, for a right femoral to tibial peroneal trunk bypass with composite  saphenous vein graft using two segments of right greater saphenous vein.  An  intraoperative arteriogram was also performed at that time.  The patient  tolerated the procedure well and was hemodynamically stable immediately  postoperatively.  The patient was transferred from the OR to the post  anesthesia care unit in stable condition.  The patient was extubated without  complications and woke up neurologically intact.   The patient's postoperative course has progressed as expected.  On  postoperative day #1, her urine culture that was performed preoperatively  was noted to be VRE positive. The patient had been placed on Ancef  postoperatively and this was continued until postoperative day #4 at which  time it was switched to Cipro.  The patient's urine  culture is pending at  this time.  The patient was maintained on heparin which was then changed to  Lovenox also on postoperative day #4 for postoperative DVT prophylaxis.  The  patient's wound VAC was placed on postoperative day #1 without  complications. This will be continued after discharge and a home health  nurse will be established to change the [facility] on Monday, Wednesday, Friday.   On postoperative day #4, the patient is comfortable, afebrile with stable  vital signs.  The incisions are healing well and there is a palpable  popliteal pulse.  There is brisk flow present in the PT and peroneal on the  right foot and the heel wound is stable.  The patient was also evaluated by  physical therapy on postoperative day #4, and it was their opinion that she  was doing well with the  boot and VAC placement.  She will need continued  physical therapy after discharge at home.  This will be established prior to  discharge.  The patient will also be discharged on p.o. Cipro for an  additional three days after discharge.  The patient is in stable condition  at this time and will be ready for discharge when she is ambulating well  with physical therapy which is anticipated within the next one to two days.   LABORATORY DATA:  Hemoglobin and hematocrit on October 07, 2004, 9.4 and  27; white count 12.5, platelets 224.   CONDITION ON DISCHARGE:  Improved.   DISCHARGE INSTRUCTIONS:  1.  Medications:      1.  Gabapentin 400 mg b.i.d.      2.  Amaryl 4 mg daily.      3.  Benazepril 40 mg daily.      4.  Elavil 10 mg q.h.s.      5.  Cipro 500 mg p.o. b.i.d. x3 additional days.      6.  Tylox one to two p.o. q.4-6h. p.r.n. pain.  2.  Activity:  The patient is to ambulate daily as outlined by physical      therapy.  Home health physical therapy will be established prior to      discharge.  3.  Diet:  Diabetic restrictions.  4.  Wound care:  The patient will be instructed to clean her incisions daily      with soap and water and a home health RN has been established to change      her VAC on the right foot Monday, Wednesday, Friday.  5.  Follow-up:  Dr. Edilia Bo on October 31, 2004, at 12:20.      Aman   AY/MEDQ  D:  10/08/2004  T:  10/09/2004  Job:  [id number]   cc:   Sherrine Maples T. Fredia Sorrow, M.D.  [address], [address]  [place]  [place] [address]  Fax: [phone]

## 2011-03-08 NOTE — Discharge Summary (Signed)
[name], [name]                            ACCOUNT NO.:  [id number]   MEDICAL RECORD NO.:  [id number]                   PATIENT TYPE:  INP   LOCATION:  3001                                 FACILITY:  [facility]   PHYSICIAN:  [name], M.D.            DATE OF BIRTH:  [birth year]   DATE OF ADMISSION:  12/18/2003  DATE OF DISCHARGE:  01/03/2004                                 DISCHARGE SUMMARY   DISCHARGE DIAGNOSES:  1. Meningococcus bacteremia.  2. Community-acquired pneumonia.  3. New onset type 2 diabetes.  4. Hypertension.  5. Dysphagia.  6. Bilateral lower extremity arthralgia and myalgias.  7. Peripheral neuropathy.  8. Anxiety.   DISCHARGE MEDICATIONS:  1. Benazepril 10 mg p.o. q.d.  2. Prevacid 30 mg p.o. q.d.  3. Colace 100 mg p.o. b.i.d.  4. Amaryl 4 mg q.d.  5. Ibuprofen 800 mg p.o. t.i.d. p.r.n.  6. Vicodin 1-2 tablets p.o. q.4h. p.r.n.   FOLLOW UP:  The patient has an appointment February 03, 2004 at 3:30 with Dr.  Anastasio Auerbach   CONSULTATIONS:  1. Infectious disease.  2. Neurology.  3. Neurosurgery.  4. Physical medicine and rehabilitation.   PROCEDURE:  1. Abdominal ultrasound.  2. CT of the chest.  3. MRI of the C-spine, L-spine, and T-spine.  4. CT of the head.  5. Lumbar puncture.   HISTORY OF PRESENT ILLNESS:  This is a 77 year old white female who  initially presented to Urgent Care after having one day of nausea, vomiting,  and fever.  She was found to have an elevated white count there and was sent  to [facility] for evaluation.  The patient  had not seen a doctor in 34 years.   REVIEW OF SYMPTOMS:  Her review of systems was positive for fevers and  chills.  Positive cough, nausea, and vomiting, as well as fatigue.  Her  initial labs showed a white blood cell count of 31.2, hemoglobin of 14.2,  hematocrit of 42, platelets of 277,000.  Sodium of 130, potassium of 3.5,  chloride of 96, CO2 of 21, glucose of  302, BUN of 12, creatinine of 1.1,  calcium of 8.9.   HOSPITAL COURSE:  Problem 1:  INFECTIOUS DISEASE:  The patient subjective  symptoms were worked up for the source of her fever.  The patient had blood  cultures done on  admission which grew back on December 23, 2003 Meningococcus  Neisseria meningitis.  The patient had a chest x-ray which showed chronic  bronchitis changes but no infiltrate.  A urinalysis that showed greater than  1000 glucose, 15 ketones, 30 protein, negative nitrite, and negative  leukocyte esterase with few bacteria.  A urine culture with no growth.  The  patient was not initially placed on antibiotics considering there was not a  source for fever and the patient came in with a temperature of 100.7 but  was  afebrile until December 21, 2003 when she spiked a low grade fever of 100.6.   Dictation ended at this point.      Anastasio Auerbach, MD                          [name], M.D.    AD/MEDQ  D:  01/02/2004  T:  01/03/2004  Job:   cc:   Anastasio Auerbach, MD  Fax: [phone]

## 2011-03-08 NOTE — Consult Note (Signed)
[name], [name]                            ACCOUNT NO.:  [id number]   MEDICAL RECORD NO.:  [id number]                   PATIENT TYPE:  REC   LOCATION:  FOOT                                 FACILITY:  [facility]   PHYSICIAN:  Jonelle Sports. Sevier, M.D.              DATE OF BIRTH:  February 08, 1931   DATE OF CONSULTATION:  05/10/2004  DATE OF DISCHARGE:                                   CONSULTATION   HISTORY:  This 77 year old white female comes referred by Dr. Wynn Banker for  assistance with management of a persistent decubitus ulcer of the right  heel.   The patient had generally enjoyed excellent health and had not seen a  physician for years, until she apparently developed Neisseria meningitis in  February of this year. She was hospitalized and treated with IV Rocephin and  apparently during her acute illness developed lower extremity decubiti on  both heels. Subsequent to that, she spent time in the rehabilitation unit  and the area on the left heel healed fairly readily. She persisted to have a  large ulcer on the posterior aspect of the right heel which has been treated  since then only with Vitamin E oil to the peri-wound area. With that, she  has had gradual progression of the wound to the point of healing, that it  now measures only 20 x 15 mm, but is covered with still-adherent eschar, and  she is anxious to move on to complete healing. She is accordingly referred  here for our evaluation and advice.   PAST MEDICAL HISTORY:  1. Type 2 diabetes, the degree of control which is less than certain.  2. Hypertension.  Her diabetes, incidentally, was first discovered when she was hospitalized  with the meningitis.  1. L5 radiculopathy secondary to spinal stenosis.   ALLERGIES:  She has no known medicinal allergies.   REGULAR MEDICATIONS:  1. Neurontin in large doses.  2. Elavil.  3. Amaryl.  4. Benazepril.  5.     Centrum Silver.  6. Calcium.   EXAMINATION:  Examination today is  limited to the distal lower extremities.  The patient's feet are without edema and without gross deformity. Her heel  cords are still somewhat tight bilaterally. Skin temperatures are in the low-  normal range and are reasonably symmetrical. Pulses are not palpable and are  difficult to detect by Doppler, but appear to be predominantly biphasic. She  does have hair growth on her toes and there is no evidence of imminent  ischemia in her feet.   She has a small callous on the dorsal lateral aspect of the left fifth toe.   Monofilament testing shows that she lacks protective sensation throughout  both feet.   On the posterior aspect of the right heel is a pear-shaped ulcer measuring  22 x 15 mm in maximum dimension and covered by an adherent blackened eschar.  There is no  evidence of surrounding infection.   DISPOSITION:  1. The patient is given instructions regarding foot care and diabetes by     video with nurse and physician reinforcement.  2. After evaluation as above, it is felt that the total healing of this     wound, with now having reached this stage, will be facilitated by removal     of the eschar, and this is accordingly sharply debrided away without     incident. There remains some fibrinous, adherent debris in the wound base     as would be expected.  3. The wound will be treated with Panafil, changed on a daily basis, and the     wound protected by a felt pad doughnut. She will be placed in an open-     heel shoe. She will be instructed still to use her PRAFO-type boot     overnight, each night until the wound is healed.  4. The patient asked about being able to drive again and as far as I am     concerned from the perspective of this wound, as soon as she can     comfortably do so, she may. I think that she needs to consult with Dr.     Wynn Banker as being more familiar with her overall situation, both mental     status and neuromuscular function for ultimate decision in  this regard     and she assures me that she will do that.  5. The patient will change the wound dressing daily at home, cleansing the     wound with warm, soapy water, drying it well and then reapplying Panafil     in the protective felt pad doughnut.  6. Follow-up visit will be to do this clinic in 13 days.                                               Jonelle Sports. Cheryll Cockayne, M.D.    RES/MEDQ  D:  05/10/2004  T:  05/10/2004  Job:  [id number]   cc:   Anastasio Auerbach, MD  Fax: [phone]   Veverly Fells. Altheimer, M.D.  [address]  [place]  [place] [address]  Fax: [phone]   Erick Colace, M.D.  [address] [place]  [place] [address]  Fax: [phone]

## 2011-03-08 NOTE — Assessment & Plan Note (Signed)
MEDICAL RECORD NUMBER:  [id number]   DATE OF BIRTH:  1930-12-12   Ms. Sinning returns today after I last saw her on Feb 23, 2004.  This is a 77-  year-old female with history of Neisseria meningitis treated with IV  Rocephin, hospitalized December 18, 2003.  Had lower extremity weakness felt  to be due to bacteremia with neuropathy as well as L5 radiculopathy.  She  had a right heel ulcer as well.  She continues to use a heel boot but this  wakes her up at night because of burning discomfort at the ball of her foot.  She is finishing up with physical therapy, thinks she has maybe 4 or 5 more  sessions.  She takes Neurontin 400 mg b.i.d. for pain.   Her interval medical history is unremarkable.   SOCIAL HISTORY:  She is married, independent level for ADLs, mobility, walks  with a single point cane compared to a walker/quad cane last visit.   EXAMINATION:  She has a left heel decubitus ulcer with dark eschar, some  flaking skin around the edges but no erythema or fluctuance at the heel.  She has normal pulses bilateral lower extremities.  She has decreased  sensation in the right greater than left lower extremity and this is both to  light touch and proprioception.  She has normal strength in the left lower  extremity.  In the right lower extremity she has 4/5 ankle dorsiflexion and  toe extensors, otherwise 5 in the proximal lower extremity.  She has full  range of motion at the knee, ankle and foot.   IMPRESSION:  1. History of Neisseria meningitis with associated neuropathy.  2. L4-5 stenosis lumbar radiculopathy.  3. Diabetes, may have some neuropathy associated with this as well.  4. Right heel ulcer doing well overall with this.  I discussed with her at     some length that debridement might hasten the healing of this ulcer,     however might set her back some with pain in the heel and this may reduce     her ambulation which we are trying to upgrade at this time.  I would be  happy to [facility] to [facility] should she desire to do so.     However she would like to hold off on this.  5. Increase her Neurontin to 400 mg q.a.m. and 800 q.h.s.   I will see her back in approximately 1 month.      Erick Colace, M.D.   AEK/MedQ  D:  03/26/2004 17:09:28  T:  03/27/2004 07:10:09  Job #:  [id number]

## 2011-03-08 NOTE — Op Note (Signed)
NAMEGRACELAND, WACHTER                ACCOUNT NO.:  [id number]   MEDICAL RECORD NO.:  [id number]          PATIENT TYPE:  OIB   LOCATION:  2899                         FACILITY:  [facility]   PHYSICIAN:  Quita Skye. Hart Rochester, M.D.  DATE OF BIRTH:  May 06, 1931   DATE OF PROCEDURE:  04/17/2005  DATE OF DISCHARGE:                                 OPERATIVE REPORT   PREOPERATIVE DIAGNOSIS:  Severe stenosis involving distal aspect of right  femoral and tibioperoneal saphenous vein graft.   PROCEDURE:  1.  Abdominal aortogram with bilateral lower extremity runoff via left      common femoral approach.  2.  Selective catheterization of right external iliac artery with right      lower extremity angiogram.   SURGEON:  Quita Skye. Hart Rochester, M.D.   ANESTHESIA:  Local Xylocaine.   CONTRAST:  170 cc.   COMPLICATIONS:  None.   DESCRIPTION OF PROCEDURE:  The patient was taken to the [facility]  Peripheral Endovascular Lab, placed in supine position; at which time both  groins were prepped with Betadine scrubbing solution, draped in a routine  sterile manner. After infiltration of 1% Xylocaine, the left common femoral  artery was entered percutaneously. A guidewire was passed into the  suprarenal aorta under fluoroscopic guidance. A 5-French sheath and dilator  were passed over the guidewire and dilator removed. Standard pigtail  catheter positioned in the suprarenal aorta. A flush abdominal aortogram was  performed, injecting 20 cc of contrast at 20 cc/sec. This revealed the aorta  to be widely patent with single renal arteries bilaterally, which appeared  normal with no stenoses. The aorta was without any areas of significant  atherosclerosis; both common internal and external iliac arteries also  appeared normal and were widely patent. Catheter was withdrawn into the  terminal aorta and bilateral lower extremity runoff performed, injecting 88  cc of contrast at 8 cc/sec. This revealed the left iliac system  to be  normal; the left common femoral artery was widely patent. Left profunda  femoris artery was patent, and the left superficial femoral artery had mild  disease throughout, but no significant stenoses. There was total occlusion  of the distal left popliteal artery below the knee, with reconstitution of  the tibioperoneal trunk and two-vessel runoff on the left via the peroneal  and posterior tibial arteries. On the right side, the iliac system was also  widely patent and normal. There was a slowly filling right common femoral to  tibioperoneal trunk vein graft, which was poorly visualized distally because  of the sluggish flow. The profunda was widely patent with some distal  disease. The superficial femoral artery on the right was chronically  occluded. The pigtail catheter was then exchanged for an IMA catheter, and  the right common iliac artery was cannulated.  A guidewire was advanced into  the right external iliac artery and IMA catheter exchanged for an end-hole  catheter. Right lower extremity angiogram was then performed through the end-  hole catheter, to better visualize the vein graft. Proximal anastomosis was  widely patent, and the vein graft  was of good caliber down to just above the  knee joint; where there was area of 95-99% stenosis extending about 3-4 cm  directly behind the knee joint. The vein graft below this was widely patent  down to its anastomosis to the tibioperoneal trunk, and there was two-vessel  runoff to the posterior tibial and peroneal arteries on the right.  Additional views of the distal aspect of the vein graft were obtained, using  the RAO projection and the magnification views. Following this, the end-hole  catheter was removed; adequate compression applied after removal sheath.  No  complications ensued.   FINDINGS:  1.  Widely patent aortoiliac system.  2.  Patent right femoral to tibioperoneal trunk vein graft, with area of      severe  stenosis over 3-4 cm segment behind the right knee joint.  3.  Two-vessel runoff on the right, via peroneal and posterior tibial      arteries.  4.  Patent, but mildly diseased left superficial femoral artery, with total      occlusion of below the left popliteal artery and two-vessel runoff on      the left.       JDL/MEDQ  D:  04/17/2005  T:  04/17/2005  Job:  [id number]

## 2011-03-08 NOTE — Assessment & Plan Note (Signed)
[name] returns today after I last saw her on April 26, 2004.  At that time,  we aspirated her right knee and injected with Kenalog.  Her blood sugars did  go up for a day, however, they normalized and her knee pain has been much  better since that time..  She has followed up at the foot center.  Her first  debridement did not bother her.  The second debridement has resulted in some  pain in her heel.  She is getting an enzymatic debridement with dressing  changes that the husband does for her.   She continues to go to outpatient therapy at [facility] and she is making  some improvements now that her knee pain has improved.  She has therapy  scheduled through the beginning of September.   She has some nerve-type pain in the right heel at night that she grades  about 4-5/10 on average.   REVIEW OF SYSTEMS:  Positive for high blood sugars.   PHYSICAL EXAMINATION:  Blood pressure 130/58, pulse 87, respirations 20, O2  saturation 97% on room air.  She has reduced sensation in bilateral toes.  Her right heel was undressed and shows an eschar with nice granulation  tissue around the edges, which is pink and then some fibrinous granulation  tissue medial to this and then at the middle a generally black eschar, but  only 1.5 cm in diameter.   She has reduced range of motion of the right knee.  She gets to about 100  degrees of flexion and -10 of extension.  The left knee has full range of  motion.  She ambulates with some mild antalgia on the right side.  Decreased  swing through gait.   IMPRESSION:  1. History of Neisseria meningitides sepsis with prolonged hospital stay     resulting in severe deconditioning complicated by diabetic neuropathy     versus vasculitic neuropathy.  2. Right heel decubitus healing slowly.  3. Diabetes.  4. Right knee osteoarthritis, improved after arthrocentesis and injections.   PLAN:  1. Continue outpatient therapy.  2. I will see her back in one  month.  3. Reinject the knee if needed at next visit.  4. The patient is to continue followup with foot center.  5. Instruct the patient to call the endocrinology office.  She apparently     has been missing their calls.      Erick Colace, M.D.   AEK/MedQ  D:  05/31/2004 16:57:46  T:  06/01/2004 11:52:02  Job #:  [id number]   cc:   Veverly Fells. Altheimer, M.D.  [address]  [place]  [place] [address]  Fax: [phone]   Jonelle Sports. Cheryll Cockayne, M.D.

## 2011-03-08 NOTE — Op Note (Signed)
[name], [name]                ACCOUNT NO.:  [id number]   MEDICAL RECORD NO.:  [id number]          PATIENT TYPE:  AMB   LOCATION:  NESC                         FACILITY:  [facility]   PHYSICIAN:  Ronald L. Earlene Plater, M.D.  DATE OF BIRTH:  [birth year]   DATE OF PROCEDURE:  01/02/2007  DATE OF DISCHARGE:                               OPERATIVE REPORT   DIAGNOSIS:  Renal insufficiency, questionable bilateral hydronephrosis.   OPERATIVE PROCEDURE:  1. Cystourethroscopy.  2. Bilateral retrograde ureteral pyelogram.   SURGEON:  Gaynelle Arabian, MD   ANESTHESIA:  LMA.   ESTIMATED BLOOD LOSS:  Negligible.   TUBES:  None.   COMPLICATIONS:  None.   INDICATION FOR PROCEDURE:  [name] is a lovely 77 year old white  female, who presented from Dr. Casey Burkitt office and Dr.  [name] office for renal insufficiency.  She had a CT scan and  ultrasounds that had questionable bilateral hydronephrosis and did have  some progressive renal insufficiency.  She has multiple medical problems  including peripheral vascular disease and diabetes, but it was felt that  an obstructive component needed to be ruled out.  After understanding  risks, benefits, and alternatives, she elects to proceed with the above  procedure.   PROCEDURE IN DETAIL:  The patient was placed in a supine position.  After proper LMA anesthesia, was placed in the dorsal lithotomy position  and prepped and draped with Betadine in a sterile fashion.  Cystourethroscopy was performed with a 22.5 [place] Olympus panendoscope.  The bladder was carefully inspected with 12 and 70 degree lenses and  noted to be without lesions.  Under fluoroscopic guidance, the ureteral  orifices were very difficult to visualize.  Therefore, 1 ampule of  indigo carmine was given IV, and they were visualized and were in normal  location.  Under fluoroscopic guidance, a bilateral retrograde ureteral  pyelogram was performed with a 6 French  open-end catheter.  Both systems  appeared to be widely patent, and no filling defects were noted.  There  was no hydronephrosis, and they promptly emptied.   IMPRESSION:  No evidence of bilateral hydronephrosis, probably simply  extrarenal pelves.  I think no further workup is indicated.  The patient  tolerated the procedure well and was taken to the recovery room stable.      Ronald L. Earlene Plater, M.D.  Electronically Signed     RLD/MEDQ  D:  01/02/2007  T:  01/03/2007  Job:  [id number]   cc:   Morley Kos, M.D.   Cecille Aver, M.D.  Fax: [phone]

## 2011-03-08 NOTE — Discharge Summary (Signed)
[name], [name]                            ACCOUNT NO.:  [id number]   MEDICAL RECORD NO.:  [id number]                   PATIENT TYPE:  IPS   LOCATION:                                       FACILITY:  [facility]   PHYSICIAN:  Erick Colace, M.D.           DATE OF BIRTH:  01/08/1931   DATE OF ADMISSION:  01/04/2004  DATE OF DISCHARGE:  01/20/2004                                 DISCHARGE SUMMARY   DISCHARGE DIAGNOSES:  1. Neisseria meningitides bacteremia.  2. New diagnosis of diabetes mellitus.  3. New hypertension.  4. Mild L4-5 stenosis.  5. Lumbar radiculopathy secondary to l5 nerve root encroachment/neuropathy.  6. Vancomycin-resistant Enterococcus urinary tract infection.  7. Hypokalemia, resolved.  8. Pulmonary nodule.   HISTORY OF PRESENT ILLNESS:  The patient is a 77 year old woman with history  of questionable asthma, otherwise in good reasonable health, with no primary  care practitioner prior to admission.  She presented on December 18, 2003  with nausea and vomiting, fever, chills, elevated blood sugars and positive  urinalysis.  After I.D. work-up, blood cultures came back positive for  Neisseria meningitides and patient was started on IV Rocephin for 14 days,  per I.D.  Questionable source of infection. Head CT revealed no acute  abnormalities. LP was performed by neurology which was negative. Chest CT  demonstrated a pulmonary nodule, repeat in 6 weeks.   HOSPITAL COURSE:  Lower extremities weakness, mild hyponatremia, diabetes  mellitus, anxiety, neuropathy and L5 radiculopathy and lethargy.  MRI of the  spine did reveal mild L4-5 spinal stenosis but L5 nerve encroachment, but no  evidence of disk space infection or osteomyelitis at the thoracic or  cervical levels. Consulted by neurosurgery, who felt patient had mild  degenerative changes of the spine, which was not contributing to the lower  extremities weakness but possibly, may be contributing to  neuropathy.   Physical therapy report:  Patient is ambulating 3 steps with walker, min-  assist. Right lower extremity weakness in transfers, maximum assist.  Patient also consulted by psychiatry for anxiety, recommendation behavior  help in the future as necessary.   The patient was transferred to [facility] Rehab Department on January 04, 2004.   PAST MEDICAL HISTORY:  Significant for above.   PAST SURGICAL HISTORY:  None.   ALLERGIES:  NONE.   PRIMARY CARE PHYSICIAN:  None at this time.   SOCIAL HISTORY:  Patient lives with husband, 2-level [place] home. Can  stay on the first level, 3-4 steps to entry. She was independent prior to  admission.  Husband works full-time. Three local children. Denies any  smoking, occasional alcohol.  Husband is part owner of the [employer].   Primary care providers while in-hospital Dr. Anastasio Auerbach, neurosurgery and  Dr. Trey Sailors;  neurology; Dr. Orlin Hilding; psychology, Dr. Antonietta Breach.   FAMILY HISTORY:  Noncontributory.   MEDICATIONS  PRIOR TO ADMISSION:  None.   REHABILITATION HOSPITAL COURSE:  [name] was admitted to [facility] Rehab Department on January 04, 2004 for comprehensive in-patient  rehabilitation.  She received more than 3 hours of therapy daily.  Hospital course significant for the following:   Problem No. 1.  NEISSERIA MENINGITIDES BACTEREMIA:  Overall, patient had no  other symptoms of bacteremia while in rehab. She had no significantly  elevated white blood cell count or fever throughout her stay in rehab. She  had already completed a course of antibiotics, prior to transfer to rehab.  Cognition remained intact, no new neurological symptoms occurred while in  rehab. Patient was able to tolerate therapies very well.  Overall, the  patient was discharged to a modified independent level.  The patient was  placed on Lovenox 20 mg daily for DVT prophylaxis without any bleeding or   complications noted.   Problem No. 2.  L4 NERVE ENCROACHMENT/LUMBAR RADICULOPATHY:  Patient  complained of significant neuropathy lower extremities with burning and  redness. She received cool packs as needed and was started on Neurontin 300  mg 3 times daily and increased to 400 mg 3 times daily. Eventually, burning  sensation did improve.  The patient received PR LFO for redness to be worn  in bedtime. Redness did improve over her ankles.  Also, the patient will use  ankle splints bilaterally for 30 minutes while in bed.   Problem No. 3.  HYPERTENSION:  Patient was newly diagnosed with hypertension  but on day #2 of rehab, the patient's blood pressure did drop and she became  orthostatic.  Stat EKG, CMET and CBC were performed, all within normal  limits. The patient did receive 1000 L bolus of normal saline, 100 cc/hr.  After receiving this bolus, blood pressure did improve significantly.  The  patient remained on Lotensin 10 mg p.o. daily throughout her stay in rehab.  Patient was consulted briefly by Dr. Anastasio Auerbach regarding this orthostatic  episode. There were no other orthostatic episodes that occurred after day #2  of rehab.   Problem No. 4.  NEWLY DIAGNOSED DIABETES MELLITUS:  The patient received  diabetic education, placed on a carbohydrate modified diet. She remained on  Amaryl 40 mg p.o. daily.  CBGs remained in reasonable control while on  rehab, no adjustments of the medications were necessary.   Problem No. 5.  VRE URINARY TRACT INFECTION:  Initially, patient was treated  with linezolid due to rest of the medications being resistant.  I.D. was  consulted regarding this medication. They recommended discontinuing that  medications after 2 days, and recommended not treating at this time,  hopefully, it will resolve with time.    Problem No. 6.  PULMONARY NODULE:  It was noted on patient's I.D. work-up that she had a pulmonary nodule. Recommended repeat chest CT in 6  weeks.  Patient needs to follow up with primary care [name] concerning this.   Problem No. 7.  MILD HYPONATREMIA:  Admission labs on March 17 revealed  patient had sodium level of 130, repeat sodium levels on January 06, 2004 was  136. No further medical management was necessary.   As no other major issues recurred while patient was in [facility]  Overall, she did progress very well, and was discharged at a modified  independent level.   DISCHARGE LABORATORY DATA:  Hemoglobin 12.2, hemoglobin 37.1, white blood  cell count 9.5, platelet count 431,000.  Sodium 136, potassium 4.3,  chloride  104, CO2 28, glucose 93, BUN 12, creatinine 0.7, AST 42, ALT 45, alkaline  phosphatase 133.   PHYSICAL THERAPY REPORT ON DISCHARGE:  Patient able to ambulate greater than  200 feet with rolling walker.  Able to transfer sit to stand modified  independently.  Move in bed modified independently.  Able to perform most  activities of daily living modified independently. The patient was  discharged to home with her family.  Overall, made progress very well and  maintained all goals.  At time of discharge, all vitals were stable. She  still demonstrated some mild to moderate redness extremities.  No  significant skin breakdown.   DISCHARGE MEDICATIONS:  1. Lotensin 10 mg 1 tablet daily.  2. Amaryl 4 mg daily.  3. Neurontin 400 mg 3 times daily.  4. Os-Cal Plus D 500 mg daily.  5. Multivitamins 1 tablet daily.  6. Pain management Tylenol.   DISCHARGE INSTRUCTIONS:  Patient is to wear bilateral ankle splints each leg  30 minutes, with PR LFO's while in bed.  Use walker. No smoking.  No  concentrated sweets in diet. Check CBGs at least twice daily and record  results with times. The patient did receive a Glucometer at time of  discharge.  [facility] outpatient rehab for physical therapy,  occupational therapy, first session being April 5, Tuesday at 8:30-12:00,  follow Dr. Para March,  primary care doctor.  Dr. Para March will call patient  regarding appointment. Follow up with Dr. Erick Colace, on January 31, 2004 at 2:20 p.m. to monitor radiculopathy.      Drucilla Schmidt, P.A.                         Erick Colace, M.D.    LB/MEDQ  D:  01/20/2004  T:  01/21/2004  Job:  [id number]   cc:   Anastasio Auerbach, MD  Fax: [phone]   Erick Colace, M.D.  [address] [place]  [place] [address]  Fax: [phone]   Gustavus Messing. Orlin Hilding, M.D.  [address]  [address]  [place]  [place] [address]  Fax: [phone]

## 2011-03-08 NOTE — Assessment & Plan Note (Signed)
MEDICAL RECORD NUMBER:  [id number].   DATE OF BIRTH:  February 13, 1931.   [name] returns today after I last saw her on March 26, 2004.  She is  accompanied by her husband, who has a typed list of questions for me.  Ms.  [name] is a 77 year old female who is in relatively good health, but had not  seen a physician for about 35 years.  She was admitted to [facility] on December 18, 2003, with Neisseria meningitides sepsis treated  with IV Rocephin.  Noted to have lower extremity weakness, mild  hyponatremia, hyperglycemia, and neuropathy, as well as L5 radiculopathy.  She underwent inpatient rehabilitation at the [facility] and was discharged to home on January 20, 2004.  She has had ongoing  problems with lower extremity numbness and some burning sensation,  particularly at night.  In addition, she has had a right heel ulcer with  dark eschar, but no evidence of erythema or fluctuance at the heel and with  normal pulses in bilateral lower extremities.  She and her husband have  concerns that it is healing very slowly.  I did indicate to her upon her  discharge that this would be a very slow healing ulcer and they do recall  this.   Another concern is the patient's right knee pain.  She has undergone right  knee x-rays showing a medial compartment narrowing, as well as  patellofemoral compartment narrowing, but relatively well-preserved lateral  compartment.  She has had some swelling in the knee, but this has improved  somewhat.  She has continued with physical therapy as an outpatient over at  [facility].  Insurance is running out,  however, the patient's therapist thinks that she is making continual gains  and the patient plans to continue.   She has had no other medical complications since I last saw her.  Her  Neurontin was increased to 400 mg q.a.m. and 800 mg q.h.s., but this has not  resulted in any further  improvements in her sleep or neuropathic pain at  night.   SOCIAL HISTORY:  Married.  Independent level of ADLs.  Walks with a single-  point cane.   PAIN SCORE:  6-7/10 on average mainly in the knee, as well as right foot.  Pain improvement scores with rest, as well as medication.   REVIEW OF SYSTEMS:  Problems with depression, poor sleep, and some weight  loss, which she does not mind actually.  She has had some problems with  vomiting at meals.   PHYSICAL EXAMINATION:  Her blood pressure is 143/81, pulse 86, respirations  14, and O2 saturation 100% on room air.  Gait:  Uses cane.  Otherwise no  evidence of toe drag or knee instability.  Her affect is alert.  Appearance  is normal.  She has decreased ankle range of motion, right greater than left  side.  She has an eschar approximately 3 cm across.  No fluctuance around  the edges.  No lymphangitic streaking.  She has good pulses.  She has  decreased sensation in bilateral feet up to the ankle to light touch.  She  has decreased ankle jerks bilaterally, but intact knee jerks.  She has  minimal swelling appreciated in the right knee.  There is no evidence of  erythema or warmth.  She has good range of motion at her knee.   IMPRESSION:  1. History of Neisseria meningitides  sepsis with a prolonged hospital stay     resulting in severe deconditioning complicated by diabetic nephropathy     versus vasculitis neuropathy.  She also has a right heel decubitus, which     is healing very slowly secondary to her diabetes history.  2. Diabetes.  She has good control.  However, given her other complications     related to it, the family is requesting an endocrinology consult or her     to be followed routinely by endocrinologist.  3. Right knee osteoarthritis.   PLAN:  1. Will do right knee aspiration injection today.  2. Referral to Veverly Fells. Altheimer, M.D., for endocrinology care.  3. [facility]erral to [facility] for possible debridement to  right heel ulcer.  4. Elavil 10 mg q.h.s.  5. Continue Neurontin 400 mg q.a.m. and 800 mg q.h.s.   ADDENDUM:  This is a right knee aspiration injection.  Informed consent was  obtained after explaining the risks and benefits of the procedure to the  patient.  In particular, I explained that given that she is diabetic, her  blood sugars may elevate.   The patient was placed supine on the exam table.  Betadine prep.  Sterile  technique.  A few cubic centimeters of 1% lidocaine were infiltrated using a  25 gauge needle to the skin and subcutaneous tissues.  Then a 21 gauge, 2  inch needle was directed by medial approach and clear yellowish joint fluid  was obtained and 10 mL were drawn off.  Then a solution containing 1 mL of  40 mg/mL Kenalog plus 4 mL of 1% lidocaine was injected.  The patient  tolerated the procedure well.  Post injection instructions given.  She was  instructed to call her primary care physician if her blood sugars raised  above 200.  She usually runs around 100.      Erick Colace, M.D.   AEK/MedQ  D:  04/26/2004 15:06:34  T:  04/26/2004 17:03:43  Job #:  [id number]   cc:   Veverly Fells. Altheimer, M.D.  [address]  [place]  [place] [address]  Fax: [phone]   [facility]  [facility]   Anastasio Auerbach, MD  Fax: [phone]

## 2011-03-08 NOTE — Discharge Summary (Signed)
[name], [name]                ACCOUNT NO.:  [id number]   MEDICAL RECORD NO.:  [id number]          PATIENT TYPE:  OIB   LOCATION:  2027                         FACILITY:  [facility]   PHYSICIAN:  Coral Ceo, P.A.     DATE OF BIRTH:  June 13, 1931   DATE OF ADMISSION:  04/17/2005  DATE OF DISCHARGE:                                 DISCHARGE SUMMARY   DATE OF ANTICIPATED DISCHARGE:  04/20/2005.   PRIMARY ADMITTING DIAGNOSIS:  Right leg pain.   ADDITIONAL/DISCHARGE DIAGNOSES:  1.  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  2.  Right lower extremity claudication symptoms.  3.  Type 2 non-insulin-dependent diabetes mellitus.  4.  Hypertension.  5.  History of previous right heel ulcer, now completely healed.  6.  Peripheral vascular occlusive disease status post right femoral-to-      tibioperoneal trunk bypass in December of 2005.   PROCEDURES PERFORMED:  1.  Abdominal aortogram with bilateral lower extremity runoff via left      common femoral approach.  2.  Selective catheterization of right external iliac artery with right      lower extremity angiogram.  3.  Right above-knee to below-knee popliteal bypass graft using      interposition reverse saphenous vein graft from the left leg and      replacing a portion of the right femoral-to-tibioperoneal trunk bypass.  4.  Intraoperative arteriogram.   HISTORY:  The patient is a 77 year old white female with a history of a  nonhealing right heel ulcer who underwent a right femoral-to-tibioperoneal  trunk bypass in December 2005 by Dr. Edilia Bo. She recently returned for  followup with a surveillance duplex scan, in our office, which showed a  probable vein graft stenosis of her bypass. Of note, the patient has had  right calf cramping with ambulation, even walking short distances lately.  She had attributed this to a change in her gait secondary to her previous  heel ulcer. She was brought in on the date of  this admission for outpatient  angiography by Dr. Hart Rochester. Her arteriogram showed a patent aortoiliac system  with an area of focal vein graft stenosis of her right fem-pop bypass around  3-4 cm behind the knee joint with 2-vessel runoff on the right and a patent  left superficial femoral artery with total occlusion of the below-knee  popliteal and 2-vessel runoff on the left. Because of her symptoms and her  significant vein graft stenosis, Dr. Hart Rochester recommended proceeding to the OR  for revision of her graft; and repair of the vein graft stenosis at this  time. After explanation of the risks, benefits and alternatives of the  procedure, the patient agreed to proceed.   HOSPITAL COURSE:  She was taken from the catheterization lab holding area to  the operating room and underwent a revision of her right femoral-to-  tibioperoneal trunk bypass with an above-knee to below-knee popliteal  bypass. She tolerated the procedure well and was transferred to the recovery  room in stable condition. Postoperatively she has done very well. She has  maintained a  strong palpable 2 plus-to-3 plus posterior tibial pulse on the  right. Her foot has been warm and well perfused; and her surgical incision  sites were all healing well. She has slowly been mobilized and is walking  with physical therapy and with the use of a walker without problem at this  time. She has otherwise been stable, has remained afebrile, and her vital  signs been stable.   Her labs, following surgery, showed a hemoglobin of 9.7, hematocrit 28.4,  platelets 219, white count 10.4.  Sodium 138, potassium 4.3, BUN 16,  creatinine 1.0. She is scheduled to have ankle-brachial indices by the  vascular lab this afternoon. It is felt that if she continues to remain  stable and her mobility is improved; she will be ready for discharge home on  04/20/2005.   DISCHARGE MEDICATIONS:  1.  Enteric-coated aspirin 325 mg q.d.  2.  Benazepril/HCT  40 mg daily.  3.  Amaryl 2 mg daily.  4.  Calcium daily.  5.  Multivitamin daily.  6.  Tylox 1-2 q.4h. p.r.n. for pain.   DISCHARGE INSTRUCTIONS:  She is asked to refrain from driving, heavy  lifting, or strenuous activity. She is to continue ambulating daily; and  home health physical therapy has been arranged to assist with mobility.  Also, a home health nurse has been arranged for wound care and will remove  her staples in 10-14 days. She will continue her same preoperative diet. She  may shower daily and clean her incisions with soap and water.   DISCHARGE FOLLOWUP:  She will see Dr. Hart Rochester back in the office on July 25  at 2 p.m. She will have repeat ankle-brachial indices at that time. If she  experiences any problems or has questions, following discharge; she is asked  to contact our office immediately.       GC/MEDQ  D:  04/19/2005  T:  04/19/2005  Job:  [id number]   cc:   Anastasio Auerbach, MD  Fax: [phone]

## 2011-03-08 NOTE — H&P (Signed)
[name], [name]                ACCOUNT NO.:  [id number]   MEDICAL RECORD NO.:  [id number]          PATIENT TYPE:  INP   LOCATION:                               FACILITY:  [facility]   PHYSICIAN:  Di Kindle. Edilia Bo, M.D.DATE OF BIRTH:  02-03-1931   DATE OF ADMISSION:  10/04/2004  DATE OF DISCHARGE:                                HISTORY & PHYSICAL   CHIEF COMPLAINT:  Non healing wound of the right heel.   HISTORY OF PRESENT ILLNESS:  This is a pleasant 77 year old woman who had  been hospitalized from February 27 until January 20, 2004 with pneumonia,  according to the family.  During this hospitalization the patient developed  a heel decubitus on the right. She has undergone aggressive outpatient  therapy for this, including hydrotherapy and dressing changes. According to  the husband the wound gradually improved over the course of several months,  but over the last 3 months has not shown any more significant improvement  despite continued aggressive treatment.   Prior to developing this heel decubitus the patient admitted to some right  calf claudication. She had no symptoms in the left leg. She denied any  history of rest pain.   Her work up for this non healing ulcer has included ABIs which were done on  August 30, 2004 which showed an ABI of 38% on the right, and 75% on the  left. She had monophasic Doppler signals in the right foot. This prompted an  arteriogram which was performed at [facility] by Dr .Fredia Sorrow and  this showed a superficial femoral artery occlusion on the right with  reconstitution of the popliteal artery and 2-vessel runoff on the right. She  was sent here for vascular evaluation given this non healing wound on the  right heel.   PAST MEDICAL HISTORY:  1.  Significant for adult diabetes. She is non-insulin-dependent.  2.  Hypertension.  3.  She denies any history of hypercholesterolemia, history of previous      myocardial infarction, or  history of congestive heart failure.   FAMILY HISTORY:  She is adopted and is unaware of her family history.   SOCIAL HISTORY:  She is married and has 4 children. She has never used  tobacco.   ALLERGIES:  No known drug allergies.   MEDICATIONS:  1.  Gabapentin 400 mg 1 p.o. b.i.d.  2.  Glimepiride 4 mg 1 p.o. q.a.m.  3.  Benazepril 40 mg p.o. q.a.m.  4.  Amitriptyline 10 mg p.o. q.p.m.  5.  Calcium 600 mg b.i.d.  6.  Vitamin C 500 mg p.o. b.i.d.  7.  Centrum Silver 1 p.o. q.a.m.   REVIEW OF SYSTEMS:  GENERAL:  She has had no weight loss, weight gain or  problems with her appetite. She weighs 165 pounds.  CARDIAC:  She denies  chest pain, chest pressure, palpitations, arrhythmia, or dyspnea on  exertion.  PULMONARY:  She denies any history of bronchitis, asthma, or  wheezing. GI: She has had no recent change in her bowel habits and has no  history of peptic  ulcer disease.  GU: She has had no dysuria or frequency.  VASCULAR: She denies any history of stroke, TIA, or amaurosis fugax. She has  had no history of DVT or phlebitis.  NEURO:  She has had no dizziness, black  outs, headaches, or seizures. ORTHO/SKIN:  She has had some arthritis, she  has had no joint pain, muscle pain or rash.  PSYCHIATRIC:  She has had no  depression or nervousness. HEMATOLOGIC:  She has had no bleeding problems or  clotting disorders.   PHYSICAL EXAMINATION:  VITAL SIGNS:  Blood pressure is 130/80, heart rate is  88. I did not detect any carotid bruits.  LUNGS:  Clear bilaterally to auscultation.  CARDIAC EXAM:  She has a regular rate and rhythm.  ABDOMEN:  Soft and nontender.  EXTREMITIES: She has palpable femoral pulses and a palpable left popliteal  pulse. I cannot palpate pedal pulses on either side. She has monophasic  Doppler signals in both feet. On the right side which is the symptomatic  side, I cannot palpate a popliteal pulse.  She has a wound on her right heel which measures 12 mm x 20 mm  and is fairly  deep but does not appear to involve the bone. There is no significant  granulation tissue present.   I reviewed her arteriogram which shows that she has no significant  aortoiliac occlusive disease. She has a widely patent right external and  right common femoral artery. The superficial femoral artery is occluded in  the proximal thigh and there is reconstitution of the popliteal artery at  the level of the knee.  However there is severe diffuse disease throughout  the popliteal artery.  The tibial peroneal trunk is patent, the anterior  tibial artery is occluded.  There is 2-vessel runoff view of the peroneal  and posterior tibial arteries on the right with good runoff  through the  posterior tibial artery onto the foot on the right.  On the left side the  superficial femoral artery is patent as is the popliteal artery. The  proximal tibial vessels are occluded, with poor visualization distally on  the left side, however it appears that there is 2-vessel runoff via the  anterior tibial and peroneal arteries which are diseased on the left.   Given this non healing wound on the right heel, I think without  revascularization there is little chance that this will heal and this is a  limb threatening situation. I have recommended attempt at revascularization  in order to increase her chances of wound healing.  It appears that she  would require a femoral to tibial peroneal trunk bypass. We have discussed  the indications for the procedure and the potential complications including  but not limited to bleeding, wound healing problems, graft thrombosis, limb  loss, or cardiac complications.  All of her questions were answered and she  is agreeable to proceed.  Her surgery has been scheduled for October 04, 2004.  I thought that most likely we would place a vac postoperatively on  this heel wound to help facilitate healing.      Chri   CSD/MEDQ  D:  09/26/2004  T:   09/26/2004  Job:  [id number]   cc:   Anastasio Auerbach, MD  Fax: [phone]

## 2011-03-08 NOTE — Discharge Summary (Signed)
[name], [name]                            ACCOUNT NO.:  [id number]   MEDICAL RECORD NO.:  [id number]                   PATIENT TYPE:  INP   LOCATION:  3001                                 FACILITY:  [facility]   PHYSICIAN:  Pearlean Brownie, M.D.            DATE OF BIRTH:  02-16-1931   DATE OF ADMISSION:  12/18/2003  DATE OF DISCHARGE:  01/03/2004                                 DISCHARGE SUMMARY   CONTINUATION OF TRANSFER SUMMARY DICTATION NUMBER [id number]:   HOSPITAL COURSE:  Problem 1. INFECTIOUS DISEASE.  Patient had not been  started on antibiotics until December 21, 2003, blood cultures for meningococcus  came back on December 23, 2003, she had been placed on Rocephin 1 g once daily,  she was changed to 2 g b.i.d. after meningococcus cultures came back.  Patient did have an LP considering she did have the lower extremity with  weakness and she did have an episode of altered mental status earlier in the  admission that we later felt was secondary to the Phenergan.  Lumbar  puncture was negative, cultures were no growth, there were 3 white blood  cells, 499 red blood cells, it was clear fluid.  Patient had an infectious  disease consult in which he recommended 14 days of treatment for the  bacteremia.  Infectious disease also wrote prescriptions for prophylaxis for  the family and hospital pharmacy provided prophylaxis to any employee who  had been in contact with the patient 24 hours prior to antibiotics being  started.  Her white count progressively decreased and at the time of  transfer was 11.4 with a hemoglobin of 12.1, hematocrit of 36.2, and  platelet count 542.  Patient did have a set of repeat blood cultures that  were no growth final.   Problem 2. HYPERTENSION.  Patient's blood pressure on admission was 159/67  and during the hospitalization she continued to have systolic hypertension.  She was placed on benazepril initially 10 mg and hydrochlorothiazide was  added however,  patient was having hyponatremia, it was decided to stop the  hydrochlorothiazide and continue benazepril and increase that dose if needed  however, patient is at 10 mg at the time of discharge and is stable  currently.   Problem 3. NEW ONSET TYPE 2 DIABETES.  Patient again had not had any medical  care prior to admission and her admission glucose was over 300.  Patient had  a fasting lipid panel and hemoglobin A1C.  She was initially begun on  Glucotrol however, patient has intolerance to swallowing pills and the  Glucotrol XL cannot be crushed and the patient was switched to Amaryl  initially 2 mg and titrated to 4 mg to get blood glucose levels between 1  and 200.  Her lipid profile with total cholesterol 150, triglycerides 108,  HDL of 34, LDL of 94, and hemoglobin N5A of 9.6.  Patient also had a TSH  which was 2.252.  Patient received diabetes education prior to transfer and  has decided to elect me as her primary care physician to follow this on a  permanent basis.   Problem 4. DYSPHAGIA.  Upon admission patient had vomiting as one of her  chief complaints but in doing history patient and family reports that  patient has had a problem with this over the past 5 years after almost every  meal.  In further discussion patient admitted that she actually has this  problem and it is secondary to anxiety.  She states that she gets very  anxious when the family talks about some of the things that are going on in  their family and that is what causes her to vomit.  She says that sometimes  she can suppress this by relaxing but she does not feel that it is a  problem.  Patient has only had one episode of emesis during her entire  hospitalization and that was within the first 4 days.  She has not had  anything for nausea.  She did have a modified barium swallow which showed  that she did not have any strictures or problems swallowing.  The only thing  noted is that patient did have difficulty  moving pills from the front of her  mouth to the back of her mouth.  Patient did have a psychiatry consult  during hospitalization for her anxiety and was agreed that patient should  have outpatient counseling for her anxiety to help with coping skills and  possibly an antianxiolytic when the patient is ready for that.   Problem 5. ARTHRALGIAS AND MYALGIAS.  Approximately on the hospital day #5-6  patient began reporting lower extremity weakness and difficulty with  standing and inability to bear weight on her leg, it was bilateral and  equal.  Patient had MRI of her L-spine initially which showed multifactorial  spinal stenosis at L4-L5 secondary to marked posterior element hypertrophy  as well as a broad-based septal disk protrusion and bilateral L5 nerve  encroachment identified, also it showed moderate facet arthropathy at L2-3  and 3-4 which does not result in stenosis, no evidence for disk space  infection or osteomyelitis.  After that report neurosurgery was called to  see the patient as this was an acute issue and if the patient needed to have  surgery neurosurgery felt that her findings were not consistent with her MRI  findings and suggested that we get neurology involved.  Neurology was  consulted and the patient had a __________ C-spine done which showed air  fluid in the sphenoid sinus, no evidence of epidural abscess or diskitis or  osteomyelitis, there is cervical degenerative changes present mildly in the  C-spine, T-spine showed no evidence of thoracic discitis, osteomyelitis, or  abscess with thoracic disk degeneration mildly noted as well.  After having  scans neurology did case review assessment that patient possibly could have  subclinical meningitis and suggested to have a CT before doing a lumbar  puncture however, as far as lower extremity weakness they did not feel it  was Guillain-Barre, they did not feel that it was actual weakness but more so pain preventing the  patient from moving her lower extremities.  Patient  had erythema of bilateral lower extremities from mid-shin down to the ankles  and appears to have chronic venostasis changes with some mild peripheral  neuropathy.  Patient spontaneously started to move the left leg without  intervention and has been slowly  working with PT/OT moving her right lower  extremity.  It has been discussed with the patient that she has to move  throughout the pain or else she will become immobile which she does not want  to do however, I do feel like there is a psychiatric component in that the  patient is benefitting from the sick role and having secondary gain but  the patient is stable to be transferred to rehab where she will work to  regain full functioning of her lower extremities considering that there was  no physiological reasoning for her immobility.   Problem 6. HYPONATREMIA.  On admission patient had a sodium of 130, patient  was dehydrated at that time for which she received IV fluids.  Sodium  returned to normal but throughout hospitalization had slowly trended down  towards 131 being the lowest.  Patient one episode was given 1 L of normal  saline, sodium returned to normal limits, second time it trended downward  patient was not given IV fluids, taken off of hydrochlorothiazide and told  to increase p.o. intake.  Patient was not eating very much during the  hospitalization.  Patient reported not liking the food.  Patient's sodium  the morning of dictation was 132 and should be rechecked to ensure that it  is stable.      Anastasio Auerbach, MD                          Pearlean Brownie, M.D.    AD/MEDQ  D:  01/03/2004  T:  01/03/2004  Job:  [id number]

## 2011-03-08 NOTE — Op Note (Signed)
NAMEALECEA, TREGO                ACCOUNT NO.:  [id number]   MEDICAL RECORD NO.:  [id number]          PATIENT TYPE:  INP   LOCATION:  2899                         FACILITY:  [facility]   PHYSICIAN:  Di Kindle. Edilia Bo, M.D.DATE OF BIRTH:  [birth year]   DATE OF PROCEDURE:  10/04/2004  DATE OF DISCHARGE:                                 OPERATIVE REPORT   PREOPERATIVE DIAGNOSIS:  Nonhealing right heel wound.   POSTOPERATIVE DIAGNOSIS:  Nonhealing right heel wound.   OPERATION PERFORMED:  1.  Right femoral to tibial peroneal trunk bypass with a composite saphenous      vein graft using two segments of right greater saphenous vein.  2.  Intraoperative arteriogram.   SURGEON:  Di Kindle. Edilia Bo, M.D.   ASSISTANT:  1.  Quita Skye. Hart Rochester, M.D.  2.  Pecola Leisure, [place]   ANESTHESIA:  General.   INDICATIONS FOR PROCEDURE:  The patient is a 77 year old woman who presented  with a nonhealing wound of the right heel.  This has failed to progress  despite aggressive outpatient therapy.  Previous arteriogram had  demonstrated superficial femoral artery occlusion with moderate  infrapopliteal artery arterial occlusive disease.  Right fem-tibial bypass  was recommended for limb salvage.  The procedure and potential complications  including but not limited to bleeding, wound healing problems, graft  thrombosis, and limb loss were discussed with the patient.  All of her  questions were answered preoperatively.   DESCRIPTION OF PROCEDURE:  The patient was taken to the operating room and  received a general anesthetic.  The right lower extremity was prepped and  draped in the usual sterile fashion.  An oblique incision was made in the  right groin where the common femoral, superficial femoral and deep femoral  arteries were dissected free and controlled with vessel loops.  The common  femoral artery had a nice pulse and was soft.  Through the same incision,  the saphenofemoral junction was  dissected free.  Saphenous vein in this area  was quite large.  However, it gave off several perforators further down  stream and became smaller.  Using four additional incisions along the medial  aspect of the right leg, the greater saphenous vein was harvested down to  the midcalf.  Of note, this patient had a bifid system.  I traced the larger  of the two branches.  Through the distal incision, the tibial peroneal trunk  was exposed by dividing the soleus muscle.  This was soft and appeared to be  a good target vessel.  The patient was heparinized after tunnel was created  from the below-knee incision to the groin incision.  The saphenofemoral  junction was clamped and the vein excised from the femoral vein and the  femoral vein was then oversewn with a 5-0 Prolene suture.  Of note, branches  had been divided between clips and 3-0 silk ties.  The vein was then  distended up with heparinized saline and there was an area where the vein  narrowed down.  I therefore had to excise this segment and anastomosis the  two segments  of vein end-to-end.  Both segments were reused in a nonreversed  fashion.  The distal segment had the valves cut prior to the vein to vein  anastomosis.  Both ends were then spatulated, then using two continuous 6-0  Prolene sutures, the veins were sewn end-to-end in a hand clasp fashion with  two 6-0 Prolene sutures.  The proximal valve at the saphenofemoral junction  was excised.  Next, the common femoral artery was clamped and the  superficial and deep femoral arteries were controlled with vessel loops.  A  longitudinal arteriotomy was made in the common femoral artery.  The  proximal vein was sewn end-to-side to the common femoral artery in a  nonreversed fashion using continuous 5-0 Prolene suture.  A radiopaque  marker was placed around the proximal anastomosis.  Next, a retrograde [place]  valvulotome was used to lyse the valves in the proximal segment of vein and   excellent flow was established through the graft.  There were multiple small  vein branches which were repaired with 7-0 Prolene sutures.  The vein to  vein anastomosis was hemostatic after two repair sutures.  The vein was then  marked to prevent twisting and then brought through the previously created  tunnel for anastomosis down to the tibial peroneal trunk.  A tourniquet  placed on the thigh and the leg exsanguinated with an Esmarch bandage.  The  tourniquet was inflated to 250 mmHg.  Under tourniquet control, a  longitudinal arteriotomy was made in the tibial peroneal trunk.  The vein  was cut to the appropriate length, spatulated and sewn end-to-side to the  artery using two continuous 6-0 Prolene sutures, parachuting with the heel  and toe of the anastomosis.  Prior to completing the anastomosis, the  tourniquet was released, the artery back-bled and flushed appropriately and  the anastomosis completed.  Flow was re-established to the foot which the  patient tolerated well.  There was good Doppler flow in the peroneal and  posterior tibial positions which was graft dependent.  Intraoperative  arteriogram was obtained which showed no technical problems.  Hemostasis was  obtained in the wounds.  A 15 Blake drain was placed in the proximal  incision and also in the distal incision.  The wounds were closed with a  deep layer of 3-0 Vicryl and the skin closed with staples.  A sterile  dressing was applied, the patient tolerated the procedure well and was  transferred to the recovery room in satisfactory condition.  All needle and  sponge counts were correct.      Chri   CSD/MEDQ  D:  10/04/2004  T:  10/04/2004  Job:  [id number]   cc:   Anastasio Auerbach, MD  Fax: [phone]

## 2011-03-08 NOTE — Assessment & Plan Note (Signed)
DATE OF BIRTH:  September 24, 1931.   MEDICAL RECORD NUMBER:  [id number].   [name] returns today after I last saw her on May 31, 2004.  Her right  knee is nonpainful.  She has been followed up at the foot center.  She has  daily dressing changes to her right heel decubitus ulcer.  She is tapering  off her physical therapy.   She reports that her blood sugars are between 90-100.  Her blood pressures  are about 130s.   No new interval history other than above.   SOCIAL HISTORY:  Lives with her husband, who continues to work.  She does  drive.   She is independent with her ADLs.   PHYSICAL EXAMINATION:  Blood pressure 133/55, pulse 85, respirations 20, O2  saturation 98% on room air.   The heel shows a granulating wound on the posterior heel not on the  weightbearing surface.  Her ankle is to 90 degrees with some passive  dorsiflexion.  Her knee gets out to -7 degrees compared to -10 on last  visit.   She ambulates with some reduced right heel strike.  No evidence of knee  instability.  She has good ankle dorsiflexion strength within range  bilaterally and good quadriceps strength.  Her right knee shows no evidence  of effusion.  There is no pain to palpation along the joint line.   IMPRESSION:  1.  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  2.  Right heel decubitus healing slowly.  3.  Right knee osteoarthritis.   PLAN:  1.  Will be tapering off outpatient therapy over the next month.  2.  I will see her back in two months.  3.  Reinject knee as needed.  4.  Continue followup at the foot center.      [name], M.D.   AEK/MedQ  D:  07/03/2004 10:07:47  T:  07/03/2004 10:48:48  Job #:  [id number]   cc:   [facility]   Jonelle Sports. Cheryll Cockayne, M.D.   Anastasio Auerbach, MD  Fax: [phone]

## 2011-03-08 NOTE — H&P (Signed)
[name], [name]                ACCOUNT NO.:  [id number]   MEDICAL RECORD NO.:  [id number]          PATIENT TYPE:  OIB   LOCATION:  2899                         FACILITY:  [facility]   PHYSICIAN:  Quita Skye. Hart Rochester, M.D.  DATE OF BIRTH:  [birth year]   DATE OF ADMISSION:  04/17/2005  DATE OF DISCHARGE:                                HISTORY & PHYSICAL   CHIEF COMPLAINT:  Right leg cramping.   HISTORY OF PRESENT ILLNESS:  The patient is a 77 year old white female who  is status post a right femoral to tibioperoneal trunk bypass graft performed  by Dr. Hart Rochester in December 2005 for a nonhealing right heel ulcer. The  patient has done well since discharge and her right heel ulcer has  completely healed with VAC therapy. She has been ambulating without problems  up until the past several weeks. At that time, she started to develop right  calf cramping with ambulating only short distances. She initially attributed  this to a change in her gait since the resolution of her heel ulcer. She  felt that she was walking more on her heel and felt that this was causing a  strain in her calf muscles. She denies any rest pain, pain at night, new  nonhealing ulcers, change in the color or temperature of her lower extremity  and has had only mild peripheral edema which has been present since her  surgery. She was seen by Dr. Hart Rochester in the office for follow-up and had a  surveillance duplex scan which showed a probable vein graft stenosis of her  fem-pop bypass graft. Dr. Hart Rochester recommended proceeding with angiography to  further delineate her anatomy and this was performed on April 17, 2005.  She  was found to have a widely patent aortoiliac system with an area of vein  graft stenosis of around 2-3 cm of the right femoral to popliteal bypass  graft just behind the knee joint. There was a two-vessel runoff on the right  and the left system showed a patent left superficial femoral artery with  total occlusion of  the below-knee popliteal and two-vessel runoff on the  left. It was Dr. Candie Chroman recommendation that she proceed to the operating  room today for repair of her vein graft stenosis for relief of her symptoms.   PAST MEDICAL HISTORY:  1.  Peripheral vascular disease.  2.  History of nonhealing right heel ulcer, now completely healed.  3.  Type 2 non-insulin-dependent diabetes mellitus.  4.  Hypertension.   PAST SURGICAL HISTORY:  Right femoral to tibioperoneal trunk bypass graft  using saphenous vein from the right lower extremity in December 2005 by Dr.  Hart Rochester   CURRENT MEDICATIONS:  1.  Benazepril/HCT 40 mg daily.  2.  Aspirin 325 mg daily.  3.  Multivitamin 1 daily  4.  Calcium daily.  5.  Amaryl 2 mg daily.   ALLERGIES:  No known drug allergies.   SOCIAL HISTORY:  She is married and has four children. She resides in  [place] with her husband. She is retired. She has never used tobacco and  does not consume alcohol.   FAMILY HISTORY:  Unknown because the patient was adopted as a child.   REVIEW OF SYSTEMS:  See history of present illness for pertinent positives  and negatives. She has otherwise been in good health and denies fevers,  chills, recent infections, weight loss, TIA symptoms, visual changes,  amaurosis fugax, dysphagia, syncope, weakness, chest pain, palpitations,  shortness of breath, dyspnea on exertion, orthopnea, paroxysmal nocturnal  dyspnea, cough, hemoptysis, abdominal pain, nausea, vomiting, diarrhea,  constipation, reflux symptoms, hematochezia, melena, hematuria, dysuria,  nocturia, gynecologic problems, anxiety, depression, intolerance to heat or  cold, joint pain or muscle problems.   PHYSICAL EXAM:  VITAL SIGNS:  Blood pressure is 106/48, heart rate 68,  respirations 16  and unlabored, temperature 97.1, O2 sat 98% on room air.  GENERAL:  This is a well-developed, well-nourished white female in no acute  distress.  HEENT:  Normocephalic,  atraumatic. Pupils equal, round and react to light  and accommodation. Extraocular movements intact. Exam of the external ears  and nose reveal no abnormalities. Oropharynx is clear with moist mucous  membranes.  NECK:  Supple without lymphadenopathy, thyromegaly or carotid bruits.  HEART:  Regular rate and rhythm without murmurs, rubs or gallops.  LUNGS:  Clear to auscultation.  ABDOMEN:  Soft, nontender, nondistended with active bowel sounds in all  quadrants. No masses or hepatosplenomegaly.  EXTREMITIES:  She has well healed bridged incisions of the right lower  extremity from the groin to just below the knee. She has a small bandage  over the left groin from her calf puncture site and there is no evidence of  hematoma. Her right heel shows no evidence of ulceration and there are no  other noted lower extremity ulcers. Her feet are warm although the right is  slightly cooler than the left. Her pulses are 3+ and symmetrical, the  femoral with Doppler dorsalis pedis and posterior tibial signals  bilaterally.  NEURO:  Cranial nerves II-XII grossly intact. She is alert and oriented x3.  Her gait is unable to be tested as she just completed an angiogram and is  lying flat at the present. Muscle strength is equal in the upper and lower  extremities and symmetrical bilaterally.   ASSESSMENT/PLAN:  This is a 77 year old female with a history of right  femoral to tibial peroneal trunk bypass graft in December 2005 who presents  now with a vein graft stenosis of her graft. She is having claudication  symptoms and in order to decrease her symptoms and lower her risk of limb  threatening ischemia, Dr. Hart Rochester has planned to proceed with operative  repair of her vein graft stenosis today, April 17, 2005.      Gi   GC/MEDQ  D:  04/17/2005  T:  04/17/2005  Job:  [id number]   cc:   Anastasio Auerbach, MD  Fax: [phone]

## 2011-03-08 NOTE — Consult Note (Signed)
NAMEAUDRI, Clements                            ACCOUNT NO.:  [id number]   MEDICAL RECORD NO.:  [id number]                   PATIENT TYPE:  INP   LOCATION:  3001                                 FACILITY:  [facility]   PHYSICIAN:  Gustavus Messing. Orlin Hilding, M.D.          DATE OF BIRTH:  1931-08-07   DATE OF CONSULTATION:  12/24/2003  DATE OF DISCHARGE:                                   CONSULTATION   CHIEF COMPLAINT:  Leg and joint pain, weakness with meningococcemia.   HISTORY OF PRESENT ILLNESS:  [name] is a generally healthy 77 year old  right-handed white woman who was admitted 5 days ago with the onset of  nausea with projectile vomiting and fever. Vomiting lasted just one day, she  has not had any since then. Work-up revealed a positive meningococcal blood  culture and she had pneumonia by chest CT. The day after admission, she  began to complain in her legs and weakness, trouble walking. She had an MRI  scan of the lumbar spine, T-spine and cervical spine which has revealed mild  to moderate disk disease but no abscesses or anything to account for her  complaints.  She had been on Rocephin for 4 days.   REVIEW OF SYSTEMS:  She complains of pain in her right index finger at the  MCP joint, pain in her bilateral shoulders, hips, knees and ankles.   PAST MEDICAL HISTORY:  Significant for glaucoma and asthma.   MEDICATIONS:  None.   ALLERGIES:  NO KNOWN DRUG ALLERGIES.   SOCIAL HISTORY:  She is very active, married. No cigarettes or alcohol use.   FAMILY HISTORY:  She is adopted.   PHYSICAL EXAMINATION:  VITAL SIGNS:  Temperature 98.1, pulse 88,  respirations 20, blood pressure 155/72, 94% saturation on room air, CBG 109.  GENERAL:  HEENT:  Normocephalic, atraumatic.  NECK:  Supple without bruits. There is no nuchal rigidity.  CARDIOVASCULAR:  Regular rate and rhythm.  EXTREMITIES:  She has erythema and warmth over the right metacarpophalangeal  joint, erythema and warmth around  her ankles suspicious for cellulitis,  certainly involving the joints to the lower shin. She clearly has pain-  limited decreased range of motion of her hips, knees, ankles and shoulders.   NEUROLOGIC:  MENTAL STATUS:  She is awake, alert, lucid with normal  cognition and language.  Nothing to suggest a toxic meningitic process.  CRANIAL NERVES:  Pupils are equal and reactive.  Visual fields are full,  extraocular movements intact, facial sensation is normal, facial motor  activity intact, hearing intact, palate symmetric.  Tongue midline. MOTOR:  Despite the pain limited decreased movement, when she is coaxed to give full  effort, she has normal psoas, quadriceps and hamstrings with 5/5 strength.  She has some decreased range of motion of the ankle and may have weakness of  foot dorsiflexion, plantar flexion, extensor hallucis longus but it is  really hard to  say. She is able to continue fairly normal strength in the  upper extremities although again complains of pain with movement at her  shoulders.  REFLEXES:  Deep tendon reflexes are 1+ and symmetric biceps,  triceps, brachioradialis, patellar. I cannot elicit ankle jerks.  She has  downgoing toes to plantar stimulation and coordination.  She does finger-to-  nose well, heel-to-shin is limited somewhat by pain but she does it fairly  well overall. SENSORY:  Intact without level or significant sensory loss.   STUDIES:  MRI scans as noted of her C-spine, T-spine and L-spine were  reviewed and showed some degenerative disease. There is a substantial disk  bulge at L4-5 but this does not account for her clinical exam.   IMPRESSION:  Diffuse arthralgias and myalgias, plus/minus some weakness in  her legs. I believe she is not so much weak as in pain and therefore has  decreased effort. I doubt she has meningitis without any nuchal rigidity,  fever, altered mental status or headache.  She may have seeded her joints  and skin with a  meningococcal bacteremia.   Guillain-Barre syndrome is an outside consideration, if there is truly  weakness, though if so, it is an extremely atypical presentation.   RECOMMENDATIONS:  For completeness sake, would recommend you do LP, get a CT  scan of the brain for extra safety. Looking for a white blood cell count for  meningitis and protein cellular dissociation with an elevated protein and  normal white blood cell count for Guillain-Barre syndrome.   Discussed this with Drs. Liam Graham and Johns.    NEUROLOGIC:                                               Gustavus Messing. Orlin Hilding, M.D.    CAW/MEDQ  D:  12/24/2003  T:  12/25/2003  Job:  [id number]

## 2011-07-29 LAB — COMPREHENSIVE METABOLIC PANEL
ALT: 18
BUN: 31 — ABNORMAL HIGH
Calcium: 9.9
Creatinine, Ser: 1.52 — ABNORMAL HIGH
Glucose, Bld: 98
Sodium: 138
Total Protein: 6.8

## 2011-07-29 LAB — CBC
Hemoglobin: 11.8 — ABNORMAL LOW
MCHC: 33.8
MCV: 93.4
RDW: 12.4

## 2011-07-29 LAB — PROTIME-INR
INR: 1
Prothrombin Time: 13.4

## 2011-11-06 ENCOUNTER — Ambulatory Visit (INDEPENDENT_AMBULATORY_CARE_PROVIDER_SITE_OTHER): Payer: Medicare Other

## 2011-11-06 DIAGNOSIS — M25569 Pain in unspecified knee: Secondary | ICD-10-CM

## 2011-11-06 DIAGNOSIS — E119 Type 2 diabetes mellitus without complications: Secondary | ICD-10-CM

## 2011-11-14 DIAGNOSIS — S82009A Unspecified fracture of unspecified patella, initial encounter for closed fracture: Secondary | ICD-10-CM | POA: Diagnosis not present

## 2011-11-19 ENCOUNTER — Ambulatory Visit: Payer: Self-pay | Admitting: Emergency Medicine

## 2011-11-21 DIAGNOSIS — S82009A Unspecified fracture of unspecified patella, initial encounter for closed fracture: Secondary | ICD-10-CM | POA: Diagnosis not present

## 2011-11-30 ENCOUNTER — Telehealth: Payer: Self-pay

## 2011-11-30 NOTE — Telephone Encounter (Signed)
Spoke with Selena Batten at [facility]. Reviewed patients chart, Tricor is 48mg .  Luanna Cole at [facility].

## 2011-12-07 ENCOUNTER — Ambulatory Visit: Payer: Medicare Other

## 2012-02-28 DIAGNOSIS — H409 Unspecified glaucoma: Secondary | ICD-10-CM | POA: Diagnosis not present

## 2012-02-28 DIAGNOSIS — H4011X0 Primary open-angle glaucoma, stage unspecified: Secondary | ICD-10-CM | POA: Diagnosis not present

## 2012-02-28 DIAGNOSIS — H2510 Age-related nuclear cataract, unspecified eye: Secondary | ICD-10-CM | POA: Diagnosis not present

## 2012-03-23 ENCOUNTER — Ambulatory Visit: Payer: Medicare Other | Admitting: Family Medicine

## 2012-03-23 ENCOUNTER — Encounter: Payer: Self-pay | Admitting: Family Medicine

## 2012-03-23 VITALS — BP 150/70 | HR 64 | Temp 97.0°F | Resp 16 | Ht 65.5 in | Wt 142.4 lb

## 2012-03-23 DIAGNOSIS — E119 Type 2 diabetes mellitus without complications: Secondary | ICD-10-CM

## 2012-03-23 DIAGNOSIS — I70219 Atherosclerosis of native arteries of extremities with intermittent claudication, unspecified extremity: Secondary | ICD-10-CM

## 2012-03-23 DIAGNOSIS — J309 Allergic rhinitis, unspecified: Secondary | ICD-10-CM

## 2012-03-23 DIAGNOSIS — J45909 Unspecified asthma, uncomplicated: Secondary | ICD-10-CM | POA: Diagnosis not present

## 2012-03-23 DIAGNOSIS — E1165 Type 2 diabetes mellitus with hyperglycemia: Secondary | ICD-10-CM

## 2012-03-23 DIAGNOSIS — N19 Unspecified kidney failure: Secondary | ICD-10-CM

## 2012-03-23 DIAGNOSIS — I10 Essential (primary) hypertension: Secondary | ICD-10-CM

## 2012-03-23 DIAGNOSIS — E785 Hyperlipidemia, unspecified: Secondary | ICD-10-CM

## 2012-03-23 NOTE — Progress Notes (Signed)
  Subjective:    Patient ID: [name], female    DOB: [birth year], 78 y.o.   MRN: [id number]  HPI Patient presents for CPE  Type 2 DM- (3) low blood sugar episodes; denies skipped meals however patient's husband not sure.                       Much more physical activity as patient is gardening daily.  Asthma- occasionally wheezing; using Pulmicort prn; continues to cough  Dyslipidemia- tolerating Tricor  ASCVD/ASPVD- Dr. Durwin Nora; S/P fem pop bypas  CKD- Here to recheck GFR; [facility](Dr. Kathrene Bongo) annual visits  Glaucoma/cataracts- Dr. Larene Beach)  Health Maintenance;  Colonoscopy- 2010; no polyps   Mammogram- due Tdap- 2010 Flu-2012 Pneumovax-2012 Zostovax- lost Rx; requests new Rx  Review of Systems See scanned intake form    Objective:   Physical Exam  Constitutional: She appears well-developed and well-nourished.  HENT:  Head: Normocephalic and atraumatic.  Right Ear: External ear normal.  Left Ear: External ear normal.  Nose: Nose normal.       peridontal disease  Neck: Neck supple. No thyromegaly present.  Cardiovascular: Normal rate, regular rhythm and normal heart sounds.   Pulmonary/Chest: Effort normal and breath sounds normal. She has no wheezes.  Abdominal: Soft. Bowel sounds are normal.  Neurological: She is alert.  Skin: Skin is warm.    Results for orders placed in visit on 03/23/12  POCT GLYCOSYLATED HEMOGLOBIN (HGB A1C)      Component Value Range   Hemoglobin A1C 6.2          Assessment & Plan:   1. DIABETES MELLITUS, II, COMPLICATIONS  POCT glycosylated hemoglobin (Hb A1C)  2. PERIPHERAL VASCULAR DISEASE, UNSPEC.    3. HYPERTENSION, Systolic Comprehensive metabolic panel, TSH; patient to check BP twice weekly and call.  If BP continues to run high will start ACE and follow GFR/ K+ closely given renal disease  4. ASTHMA, UNSPECIFIED, UNSPECIFIED STATUS  CBC with Differential  5. RENAL INSUFFICIENCY, CHRONIC  Comprehensive  metabolic panel  6. ALLERGIC RHINITIS    7. Dyslipidemia  Lipid panel    Anticipatory guidance Patient to call with BP readings in 2 weeks; will adjust meds at that time Must use Pulmicort daily Concerned about hypoglycemic episodes; stressed not skipping meals and will hold Amaryl at this time Track fasting and 2 hour pp BS.

## 2012-03-24 LAB — CBC WITH DIFFERENTIAL/PLATELET
Basophils Relative: 1 % (ref 0–1)
Eosinophils Absolute: 0.3 10*3/uL (ref 0.0–0.7)
Eosinophils Relative: 3 % (ref 0–5)
HCT: 36.4 % (ref 36.0–46.0)
Hemoglobin: 12.4 g/dL (ref 12.0–15.0)
Lymphs Abs: 2.5 10*3/uL (ref 0.7–4.0)
MCH: 32 pg (ref 26.0–34.0)
MCHC: 34.1 g/dL (ref 30.0–36.0)
MCV: 93.8 fL (ref 78.0–100.0)
Monocytes Absolute: 0.6 10*3/uL (ref 0.1–1.0)
Monocytes Relative: 7 % (ref 3–12)
RBC: 3.88 MIL/uL (ref 3.87–5.11)

## 2012-03-24 LAB — COMPREHENSIVE METABOLIC PANEL
BUN: 28 mg/dL — ABNORMAL HIGH (ref 6–23)
CO2: 25 mEq/L (ref 19–32)
Creat: 1.11 mg/dL — ABNORMAL HIGH (ref 0.50–1.10)
Glucose, Bld: 105 mg/dL — ABNORMAL HIGH (ref 70–99)
Sodium: 140 mEq/L (ref 135–145)
Total Bilirubin: 0.4 mg/dL (ref 0.3–1.2)
Total Protein: 6.7 g/dL (ref 6.0–8.3)

## 2012-03-24 LAB — LIPID PANEL
Cholesterol: 168 mg/dL (ref 0–200)
HDL: 49 mg/dL (ref >39)
Triglycerides: 119 mg/dL (ref <150)
VLDL: 24 mg/dL (ref 0–40)

## 2012-04-29 DIAGNOSIS — H4011X0 Primary open-angle glaucoma, stage unspecified: Secondary | ICD-10-CM | POA: Diagnosis not present

## 2012-04-29 DIAGNOSIS — H52209 Unspecified astigmatism, unspecified eye: Secondary | ICD-10-CM | POA: Insufficient documentation

## 2012-04-29 DIAGNOSIS — H409 Unspecified glaucoma: Secondary | ICD-10-CM | POA: Diagnosis not present

## 2012-04-29 DIAGNOSIS — H2510 Age-related nuclear cataract, unspecified eye: Secondary | ICD-10-CM | POA: Diagnosis not present

## 2012-05-24 DIAGNOSIS — IMO0001 Reserved for inherently not codable concepts without codable children: Secondary | ICD-10-CM

## 2012-05-24 DIAGNOSIS — Z5189 Encounter for other specified aftercare: Secondary | ICD-10-CM

## 2012-05-24 HISTORY — DX: Encounter for other specified aftercare: Z51.89

## 2012-05-24 HISTORY — DX: Reserved for inherently not codable concepts without codable children: IMO0001

## 2012-05-25 ENCOUNTER — Ambulatory Visit: Payer: Medicare Other

## 2012-05-25 ENCOUNTER — Encounter (HOSPITAL_COMMUNITY): Payer: Self-pay | Admitting: *Deleted

## 2012-05-25 ENCOUNTER — Emergency Department (HOSPITAL_COMMUNITY): Payer: Medicare Other

## 2012-05-25 ENCOUNTER — Inpatient Hospital Stay (HOSPITAL_COMMUNITY)
Admission: EM | Admit: 2012-05-25 | Discharge: 2012-05-29 | DRG: 378 | Disposition: A | Discharge status: Home / Self Care | Payer: Medicare Other | Attending: Internal Medicine | Admitting: Internal Medicine

## 2012-05-25 ENCOUNTER — Ambulatory Visit (INDEPENDENT_AMBULATORY_CARE_PROVIDER_SITE_OTHER): Payer: Medicare Other | Admitting: Family Medicine

## 2012-05-25 VITALS — BP 132/64 | HR 78 | Temp 97.9°F | Resp 16 | Ht 65.5 in | Wt 138.0 lb

## 2012-05-25 DIAGNOSIS — M25559 Pain in unspecified hip: Secondary | ICD-10-CM

## 2012-05-25 DIAGNOSIS — M25561 Pain in right knee: Secondary | ICD-10-CM

## 2012-05-25 DIAGNOSIS — E119 Type 2 diabetes mellitus without complications: Secondary | ICD-10-CM | POA: Diagnosis not present

## 2012-05-25 DIAGNOSIS — R062 Wheezing: Secondary | ICD-10-CM

## 2012-05-25 DIAGNOSIS — R531 Weakness: Secondary | ICD-10-CM

## 2012-05-25 DIAGNOSIS — J45909 Unspecified asthma, uncomplicated: Secondary | ICD-10-CM | POA: Diagnosis present

## 2012-05-25 DIAGNOSIS — E118 Type 2 diabetes mellitus with unspecified complications: Secondary | ICD-10-CM

## 2012-05-25 DIAGNOSIS — M479 Spondylosis, unspecified: Secondary | ICD-10-CM

## 2012-05-25 DIAGNOSIS — K921 Melena: Principal | ICD-10-CM | POA: Diagnosis present

## 2012-05-25 DIAGNOSIS — R1310 Dysphagia, unspecified: Secondary | ICD-10-CM | POA: Diagnosis not present

## 2012-05-25 DIAGNOSIS — D1330 Benign neoplasm of unspecified part of small intestine: Secondary | ICD-10-CM | POA: Diagnosis not present

## 2012-05-25 DIAGNOSIS — K922 Gastrointestinal hemorrhage, unspecified: Secondary | ICD-10-CM

## 2012-05-25 DIAGNOSIS — I872 Venous insufficiency (chronic) (peripheral): Secondary | ICD-10-CM | POA: Diagnosis not present

## 2012-05-25 DIAGNOSIS — R5381 Other malaise: Secondary | ICD-10-CM | POA: Diagnosis present

## 2012-05-25 DIAGNOSIS — I70219 Atherosclerosis of native arteries of extremities with intermittent claudication, unspecified extremity: Secondary | ICD-10-CM

## 2012-05-25 DIAGNOSIS — K222 Esophageal obstruction: Secondary | ICD-10-CM | POA: Diagnosis present

## 2012-05-25 DIAGNOSIS — J309 Allergic rhinitis, unspecified: Secondary | ICD-10-CM

## 2012-05-25 DIAGNOSIS — D126 Benign neoplasm of colon, unspecified: Secondary | ICD-10-CM

## 2012-05-25 DIAGNOSIS — R5383 Other fatigue: Secondary | ICD-10-CM | POA: Diagnosis not present

## 2012-05-25 DIAGNOSIS — N179 Acute kidney failure, unspecified: Secondary | ICD-10-CM | POA: Diagnosis not present

## 2012-05-25 DIAGNOSIS — I951 Orthostatic hypotension: Secondary | ICD-10-CM

## 2012-05-25 DIAGNOSIS — D62 Acute posthemorrhagic anemia: Secondary | ICD-10-CM | POA: Diagnosis not present

## 2012-05-25 DIAGNOSIS — D649 Anemia, unspecified: Secondary | ICD-10-CM

## 2012-05-25 DIAGNOSIS — I10 Essential (primary) hypertension: Secondary | ICD-10-CM | POA: Diagnosis not present

## 2012-05-25 DIAGNOSIS — J984 Other disorders of lung: Secondary | ICD-10-CM | POA: Diagnosis not present

## 2012-05-25 DIAGNOSIS — M25569 Pain in unspecified knee: Secondary | ICD-10-CM | POA: Diagnosis not present

## 2012-05-25 DIAGNOSIS — K219 Gastro-esophageal reflux disease without esophagitis: Secondary | ICD-10-CM | POA: Diagnosis not present

## 2012-05-25 DIAGNOSIS — D500 Iron deficiency anemia secondary to blood loss (chronic): Secondary | ICD-10-CM

## 2012-05-25 DIAGNOSIS — E1159 Type 2 diabetes mellitus with other circulatory complications: Secondary | ICD-10-CM | POA: Diagnosis present

## 2012-05-25 DIAGNOSIS — N19 Unspecified kidney failure: Secondary | ICD-10-CM

## 2012-05-25 DIAGNOSIS — E1165 Type 2 diabetes mellitus with hyperglycemia: Secondary | ICD-10-CM | POA: Diagnosis not present

## 2012-05-25 DIAGNOSIS — N390 Urinary tract infection, site not specified: Secondary | ICD-10-CM

## 2012-05-25 DIAGNOSIS — D72829 Elevated white blood cell count, unspecified: Secondary | ICD-10-CM | POA: Diagnosis present

## 2012-05-25 DIAGNOSIS — R739 Hyperglycemia, unspecified: Secondary | ICD-10-CM

## 2012-05-25 DIAGNOSIS — R7309 Other abnormal glucose: Secondary | ICD-10-CM

## 2012-05-25 DIAGNOSIS — R05 Cough: Secondary | ICD-10-CM

## 2012-05-25 HISTORY — DX: Unspecified glaucoma: H40.9

## 2012-05-25 HISTORY — DX: Unspecified asthma, uncomplicated: J45.909

## 2012-05-25 LAB — POCT URINALYSIS DIPSTICK
Bilirubin, UA: NEGATIVE
Blood, UA: NEGATIVE
Glucose, UA: >1000
Ketones, UA: NEGATIVE
Leukocytes, UA: NEGATIVE
Nitrite, UA: POSITIVE
Protein, UA: NEGATIVE
Spec Grav, UA: <=1.005
Urobilinogen, UA: 0.2
pH, UA: 5

## 2012-05-25 LAB — CBC WITH DIFFERENTIAL/PLATELET
Eosinophils Relative: 1 % (ref 0–5)
HCT: 19 % — ABNORMAL LOW (ref 36.0–46.0)
Lymphocytes Relative: 22 % (ref 12–46)
Lymphs Abs: 3.3 10*3/uL (ref 0.7–4.0)
MCV: 95.5 fL (ref 78.0–100.0)
Platelets: 269 10*3/uL (ref 150–400)
RBC: 1.99 MIL/uL — ABNORMAL LOW (ref 3.87–5.11)
WBC: 14.6 10*3/uL — ABNORMAL HIGH (ref 4.0–10.5)

## 2012-05-25 LAB — COMPREHENSIVE METABOLIC PANEL
ALT: 11 U/L (ref 0–35)
Alkaline Phosphatase: 40 U/L (ref 39–117)
BUN: 52 mg/dL — ABNORMAL HIGH (ref 6–23)
CO2: 27 mEq/L (ref 19–32)
Calcium: 9.6 mg/dL (ref 8.4–10.5)
GFR calc Af Amer: 47 mL/min — ABNORMAL LOW (ref >90)
GFR calc non Af Amer: 41 mL/min — ABNORMAL LOW (ref >90)
Glucose, Bld: 286 mg/dL — ABNORMAL HIGH (ref 70–99)
Sodium: 135 mEq/L (ref 135–145)
Total Protein: 6.2 g/dL (ref 6.0–8.3)

## 2012-05-25 LAB — POCT CBC
Granulocyte percent: 76.6 % (ref 37–80)
HCT, POC: 23.1 % — AB (ref 37.7–47.9)
Hemoglobin: 6.9 g/dL — AB (ref 12.2–16.2)
Lymph, poc: 3.4 (ref 0.6–3.4)
MCH, POC: 30 pg (ref 27–31.2)
MCHC: 29.9 g/dL — AB (ref 31.8–35.4)
MCV: 100.3 fL — AB (ref 80–97)
MID (cbc): 0.8 (ref 0–0.9)
MPV: 8.5 fL (ref 0–99.8)
POC Granulocyte: 13.6 — AB (ref 2–6.9)
POC LYMPH PERCENT: 19 % (ref 10–50)
POC MID %: 4.4 %M (ref 0–12)
Platelet Count, POC: 283 10*3/uL (ref 142–424)
RBC: 2.3 M/uL — AB (ref 4.04–5.48)
RDW, POC: 13.2 %
WBC: 17.7 10*3/uL — AB (ref 4.6–10.2)

## 2012-05-25 LAB — POCT UA - MICROSCOPIC ONLY
Casts, Ur, LPF, POC: NEGATIVE
Crystals, Ur, HPF, POC: NEGATIVE
Mucus, UA: NEGATIVE
RBC, urine, microscopic: NEGATIVE
Yeast, UA: NEGATIVE

## 2012-05-25 LAB — PREPARE RBC (CROSSMATCH)

## 2012-05-25 LAB — GLUCOSE, POCT (MANUAL RESULT ENTRY): POC Glucose: >444 mg/dL (ref 70–99)

## 2012-05-25 LAB — PROTIME-INR: Prothrombin Time: 13.7 seconds (ref 11.6–15.2)

## 2012-05-25 LAB — RETICULOCYTES
Retic Count, Absolute: 97.5 10*3/uL (ref 19.0–186.0)
Retic Ct Pct: 4.9 % — ABNORMAL HIGH (ref 0.4–3.1)

## 2012-05-25 MED ORDER — SODIUM CHLORIDE 0.9 % IJ SOLN
3.0000 mL | Freq: Two times a day (BID) | INTRAMUSCULAR | Status: DC
  Administered 2012-05-27 – 2012-05-28 (×3): 3 mL via INTRAVENOUS

## 2012-05-25 MED ORDER — SODIUM CHLORIDE 0.9 % IV SOLN
INTRAVENOUS | Status: AC
  Administered 2012-05-26 (×2): via INTRAVENOUS
  Administered 2012-05-26: 500 mL via INTRAVENOUS

## 2012-05-25 MED ORDER — FENOFIBRATE 54 MG PO TABS
54.0000 mg | ORAL_TABLET | Freq: Every day | ORAL | Status: DC
  Administered 2012-05-26 – 2012-05-29 (×4): 54 mg via ORAL
  Filled 2012-05-25 (×4): qty 1

## 2012-05-25 MED ORDER — VITAMIN C 500 MG PO TABS
1000.0000 mg | ORAL_TABLET | Freq: Every day | ORAL | Status: DC
  Administered 2012-05-26 – 2012-05-29 (×4): 1000 mg via ORAL
  Filled 2012-05-25 (×4): qty 2

## 2012-05-25 MED ORDER — SODIUM CHLORIDE 0.9 % IV SOLN
Freq: Once | INTRAVENOUS | Status: AC
  Administered 2012-05-25: 1000 mL via INTRAVENOUS

## 2012-05-25 MED ORDER — PANTOPRAZOLE SODIUM 40 MG IV SOLR
40.0000 mg | Freq: Once | INTRAVENOUS | Status: AC
  Administered 2012-05-25: 40 mg via INTRAVENOUS
  Filled 2012-05-25: qty 40

## 2012-05-25 MED ORDER — CALCIUM CARBONATE 1250 (500 CA) MG PO TABS
1.0000 | ORAL_TABLET | Freq: Two times a day (BID) | ORAL | Status: DC
  Administered 2012-05-26 – 2012-05-29 (×6): 500 mg via ORAL
  Filled 2012-05-25 (×10): qty 1

## 2012-05-25 MED ORDER — CALCIUM CARBONATE 600 MG PO TABS: 600.0000 mg | ORAL_TABLET | Freq: Two times a day (BID) | ORAL | Status: DC

## 2012-05-25 MED ORDER — PANTOPRAZOLE SODIUM 40 MG IV SOLR
40.0000 mg | Freq: Two times a day (BID) | INTRAVENOUS | Status: DC
  Administered 2012-05-26 (×2): 40 mg via INTRAVENOUS
  Filled 2012-05-25 (×3): qty 40

## 2012-05-25 MED ORDER — ACETAMINOPHEN 325 MG PO TABS
650.0000 mg | ORAL_TABLET | Freq: Once | ORAL | Status: AC
  Administered 2012-05-26: 650 mg via ORAL
  Filled 2012-05-25: qty 2

## 2012-05-25 MED ORDER — CEFTRIAXONE SODIUM 1 G IJ SOLR
1.0000 g | Freq: Once | INTRAMUSCULAR | Status: AC
  Administered 2012-05-25: 1 g via INTRAMUSCULAR

## 2012-05-25 MED ORDER — ASPIRIN 325 MG PO TABS
325.0000 mg | ORAL_TABLET | Freq: Every day | ORAL | Status: DC
  Filled 2012-05-25: qty 1

## 2012-05-25 MED ORDER — DIPHENHYDRAMINE HCL 25 MG PO CAPS
25.0000 mg | ORAL_CAPSULE | Freq: Once | ORAL | Status: AC
  Administered 2012-05-26: 25 mg via ORAL
  Filled 2012-05-25: qty 1

## 2012-05-25 MED ORDER — METOPROLOL SUCCINATE ER 50 MG PO TB24
50.0000 mg | ORAL_TABLET | Freq: Every day | ORAL | Status: DC
  Administered 2012-05-26 – 2012-05-29 (×4): 50 mg via ORAL
  Filled 2012-05-25 (×4): qty 1

## 2012-05-25 MED ORDER — FUROSEMIDE 10 MG/ML IJ SOLN
20.0000 mg | Freq: Once | INTRAMUSCULAR | Status: AC
  Administered 2012-05-26: 20 mg via INTRAVENOUS
  Filled 2012-05-25: qty 2

## 2012-05-25 MED ORDER — INSULIN ASPART 100 UNIT/ML ~~LOC~~ SOLN
0.0000 [IU] | Freq: Three times a day (TID) | SUBCUTANEOUS | Status: DC
  Administered 2012-05-26: 1 [IU] via SUBCUTANEOUS
  Administered 2012-05-26: 3 [IU] via SUBCUTANEOUS
  Administered 2012-05-27 – 2012-05-28 (×3): 1 [IU] via SUBCUTANEOUS
  Administered 2012-05-28: 3 [IU] via SUBCUTANEOUS
  Administered 2012-05-28: 1 [IU] via SUBCUTANEOUS

## 2012-05-25 NOTE — Progress Notes (Signed)
[facility]:  Office Visit  Chief Complaint:  Chief Complaint  Patient presents with  . Fall    between saturday and this morning  she fell 4 times    HPI: [name] is a 78 y.o. female who complains of : multiple falls. Sunday morning had a weak spell x  Then subsequently had 4 episodes of fall. Denies hitting head, seqizue activities. NO AMS. NO confusion s/p fall. . First episode with husband, per her husband she slid down to the ground each time.  Has a h/o diabetes. Urine smells. She has been out and about and left hip was giving her pain. Right knee pain as well. Deneis fevers,chills, CP, SOB.   Past Medical History  Diagnosis Date  . Anemia   . Hypertension   . GERD (gastroesophageal reflux disease)   . Diabetes mellitus   . Hyperlipidemia   . DVT (deep venous thrombosis)    History reviewed. No pertinent past surgical history. History   Social History  . Marital Status: Married    Spouse Name: N/A    Number of Children: N/A  . Years of Education: N/A   Social History Main Topics  . Smoking status: Never Smoker   . Smokeless tobacco: None  . Alcohol Use: None  . Drug Use: None  . Sexually Active: None   Other Topics Concern  . None   Social History Narrative  . None   No family history on file. No Known Allergies Prior to Admission medications   Medication Sig Start Date End Date Taking? Authorizing Provider  Ascorbic Acid (VITAMIN C) 1000 MG tablet Take 1,000 mg by mouth daily.   Yes Historical Provider, MD  aspirin 325 MG tablet Take 325 mg by mouth daily.   Yes Historical Provider, MD  benzonatate (TESSALON) 100 MG capsule Take 100 mg by mouth 3 (three) times daily as needed.   Yes Historical Provider, MD  calcium carbonate (OS-CAL) 600 MG TABS Take 600 mg by mouth 2 (two) times daily with a meal.   Yes Historical Provider, MD  famotidine (PEPCID) 20 MG tablet Take 20 mg by mouth 2 (two) times daily as needed.   Yes Historical  Provider, MD  fenofibrate (TRICOR) 48 MG tablet Take 48 mg by mouth daily.   Yes Historical Provider, MD  furosemide (LASIX) 40 MG tablet Take 40 mg by mouth daily as needed.   Yes Historical Provider, MD  metoprolol succinate (TOPROL-XL) 50 MG 24 hr tablet Take 50 mg by mouth daily. Take with or immediately following a meal.   Yes Historical Provider, MD  Multiple Vitamin (MULTIVITAMIN) capsule Take 1 capsule by mouth daily.   Yes Historical Provider, MD  glimepiride (AMARYL) 1 MG tablet Take 1 mg by mouth daily before breakfast. Takes 1/2 tablet daily    Historical Provider, MD     ROS: The patient denies fevers, chills, night sweats, unintentional weight loss, chest pain, palpitations, wheezing, dyspnea on exertion, nausea, vomiting, abdominal pain, dysuria, hematuria, melena, numbness, weakness, or tingling.  All other systems have been reviewed and were otherwise negative with the exception of those mentioned in the HPI and as above.    PHYSICAL EXAM: Filed Vitals:   05/25/12 1340  BP: 132/64  Pulse: 78  Temp: 97.9 F (36.6 C)  Resp: 16   Filed Vitals:   05/25/12 1340  Height: 5' 5.5" (1.664 m)  Weight: 138 lb (62.596 kg)   Body mass index is 22.61 kg/(m^2). BP  laying 110/66, 91, sitting 100/60, 85; standing 80/58, 86  General: Alert, no acute distress HEENT:  Normocephalic, atraumatic, oropharynx patent. EOMI, PERRLA Cardiovascular:  Regular rate and rhythm, no rubs murmurs or gallops.  No Carotid bruits, radial pulse intact. No pedal edema.  Respiratory: Clear to auscultation bilaterally.  No wheezes, rales, or rhonchi.  No cyanosis, no use of accessory musculature GI: No organomegaly, abdomen is soft and non-tender, positive bowel sounds.  No masses. Skin: No rashes. Neurologic: Facial musculature symmetric. CN 2-12 grossly intact.  Psychiatric: Patient is appropriate throughout our interaction. Lymphatic: No cervical lymphadenopathy Musculoskeletal: Gait intact but  slowed and cautious. No cogwheeling, no shuffling.    LABS: Results for orders placed in visit on 05/25/12  POCT CBC      Component Value Range   WBC 17.7 (*) 4.6 - 10.2 K/uL   Lymph, poc 3.4  0.6 - 3.4   POC LYMPH PERCENT 19.0  10 - 50 %L   MID (cbc) 0.8  0 - 0.9   POC MID % 4.4  0 - 12 %M   POC Granulocyte 13.6 (*) 2 - 6.9   Granulocyte percent 76.6  37 - 80 %G   RBC 2.30 (*) 4.04 - 5.48 M/uL   Hemoglobin 6.9 (*) 12.2 - 16.2 g/dL   HCT, POC 16.1 (*) 09.6 - 47.9 %   MCV 100.3 (*) 80 - 97 fL   MCH, POC 30.0  27 - 31.2 pg   MCHC 29.9 (*) 31.8 - 35.4 g/dL   RDW, POC 04.5     Platelet Count, POC 283  142 - 424 K/uL   MPV 8.5  0 - 99.8 fL  POCT UA - MICROSCOPIC ONLY      Component Value Range   WBC, Ur, HPF, POC 3-4     RBC, urine, microscopic neg     Bacteria, U Microscopic 3+bacilli     Mucus, UA neg     Epithelial cells, urine per micros 0-1     Crystals, Ur, HPF, POC neg     Casts, Ur, LPF, POC neg     Yeast, UA neg    POCT URINALYSIS DIPSTICK      Component Value Range   Color, UA pale yellow     Clarity, UA cloudy     Glucose, UA >1000     Bilirubin, UA neg     Ketones, UA neg     Spec Grav, UA <=1.005     Blood, UA neg     pH, UA 5.0     Protein, UA neg     Urobilinogen, UA 0.2     Nitrite, UA positive     Leukocytes, UA Negative    GLUCOSE, POCT (MANUAL RESULT ENTRY)      Component Value Range   POC Glucose >444  70 - 99 mg/dl     EKG/XRAY:   Primary read interpreted by Dr. Conley Rolls at [facility]. Knee and hip xray -DJD with calcifications   ASSESSMENT/PLAN: Encounter Diagnoses  Name Primary?  . Weakness Yes  . Hip pain   . Right knee pain   . Hyperglycemia   . UTI (lower urinary tract infection)   . Orthostasis    78 y/o [place] female with with multiple medical problems primarily falls and weakness.   1. Severely anemic-? Etiology ( last colonoscopy within last 5 years--nl per patient family)  2. Hyperglycemia due to poorly controlled DM ( known DM not on  medicine due to prior  weakness on Amaryl). 3. UTI-urine cx pending, Gave Rocephin 1 gram in office  Send to [facility] ER for further evaluation     Kateryn Marasigan PHUONG, DO 05/25/2012 3:26 PM

## 2012-05-25 NOTE — ED Notes (Signed)
Red socks already on pt. Placed fall risk bracelet on pt.  

## 2012-05-25 NOTE — H&P (Signed)
[facility] History and Physical  [name] VQQ:[id number] DOB: 1931-07-21 DOA: 05/25/2012  Referring physician: Dione Booze PCP: Elvina Sidle, MD  GI:  Jeani Hawking  Chief Complaint: weakness  HPI:  78 yo female with dm2, c/o weakness, generalized,  Starting on Sunday am.   Pt admits to sob, thought that this was due to her asthma.  +doe.   Denies fever, chills, cp, palp, n/v, abd pain, diarrhea, constipation, brbpr, "stool was deep brown"  + ? Black stool, but takes iron.   This morning weakness contniued, and pt was seen at urgent care  And found to have anemia, and uti. Pt was sent to ER for evaluation. Hgb 6.5,  Pt has prior EGD/colonoscopy=> had esophageal dilation and ? No polyps per pt.      Review of Systems:  Negative for all organ systems except for + above hpi  Past Medical History  Diagnosis Date  . Anemia   . Hypertension   . GERD (gastroesophageal reflux disease)   . Diabetes mellitus   . Hyperlipidemia   . DVT (deep venous thrombosis) 03/2005  . Glaucoma   . Cataract   . Asthma    Past Surgical History  Procedure Date  . Vascular surgery 09/2004  . Wrist fracture surgery     R wrist fracture, plate   Social History:  reports that she has never smoked. She has never used smokeless tobacco. She reports that she does not drink alcohol. Her drug history not on file. Home   Can patient participate in ADLs?  yees  No Known Allergies  Family History  Problem Relation Age of Onset  . Adopted: Yes  . Family history unknown: Yes   (be sure to complete)  Prior to Admission medications   Medication Sig Start Date End Date Taking? Authorizing Provider  Ascorbic Acid (VITAMIN C) 1000 MG tablet Take 1,000 mg by mouth daily.   Yes Historical Provider, MD  aspirin 325 MG tablet Take 325 mg by mouth daily.   Yes Historical Provider, MD  calcium carbonate (OS-CAL) 600 MG TABS Take 600 mg by mouth 2 (two) times daily with a meal.   Yes Historical Provider, MD    cefTRIAXone (ROCEPHIN) 1 G injection Inject 1 g into the muscle once.   Yes Historical Provider, MD  fenofibrate (TRICOR) 48 MG tablet Take 48 mg by mouth daily.   Yes Historical Provider, MD  metoprolol succinate (TOPROL-XL) 50 MG 24 hr tablet Take 50 mg by mouth daily. Take with or immediately following a meal.   Yes Historical Provider, MD  Multiple Vitamin (MULTIVITAMIN) capsule Take 1 capsule by mouth daily.   Yes Historical Provider, MD   Physical Exam: Filed Vitals:   05/25/12 2040 05/25/12 2100 05/25/12 2130 05/25/12 2200  BP: 116/55 120/47 110/49 134/54  Pulse: 79 78 80 85  Temp:      TempSrc:      Resp: 21 21 22 18   SpO2: 96% 96% 95% 98%     General:  Elderly white female  ENT: anicteric, eomi, no nystagmus, pupils 1.30mm symmetric, direct, consensual , near intact  Neck: no jvd, no bruit, no tm, no adenopathy  Cardiovascular: rrr s1, s2,  Respiratory: ctab  Abdomen: soft, nt, n,d +bs  Skin: no rash  Musculoskeletal: nl tone  Psychiatric: no depression,   Neurologic: nonfocal, cn2-12 intact, reflexes 2+ symmetric, diffuse   Labs on Admission:  Basic Metabolic Panel:  Lab 05/25/12 4332  NA 135  K 4.4  CL  99  CO2 27  GLUCOSE 286*  BUN 52*  CREATININE 1.23*  CALCIUM 9.6  MG --  PHOS --   Liver Function Tests:  Lab 05/25/12 1907  AST 17  ALT 11  ALKPHOS 40  BILITOT 0.1*  PROT 6.2  ALBUMIN 3.5   No results found for this basename: LIPASE:5,AMYLASE:5 in the last 168 hours No results found for this basename: AMMONIA:5 in the last 168 hours CBC:  Lab 05/25/12 1907 05/25/12 1441  WBC 14.6* 17.7*  NEUTROABS 10.1* --  HGB 6.5* 6.9*  HCT 19.0* 23.1*  MCV 95.5 100.3*  PLT 269 --   Cardiac Enzymes: No results found for this basename: CKTOTAL:5,CKMB:5,CKMBINDEX:5,TROPONINI:5 in the last 168 hours  BNP (last 3 results) No results found for this basename: PROBNP:3 in the last 8760 hours CBG:  Lab 05/25/12 1707  GLUCAP 376*    Radiological  Exams on Admission: Dg Hip Complete Left  05/25/2012  *RADIOLOGY REPORT*  Clinical Data: Chronic hip pain.  LEFT HIP - COMPLETE 2+ VIEW  Comparison: None.  Findings: SI joints appear intact.  There is minimal spurring on the left at the pubic symphysis.  Pelvic phleboliths are present. Nonaneurysmal arterial calcifications are present.  There is transitional vertebra at the lumbosacral junction with formation of assimilation joint on the left with the sacrum.  There is minimal osteophyte formation associated with degenerative spondylosis.  Hip joint spaces are relatively preserved.  Osteophyte formation is seen involving the acetabulum and the femoral head consistent with degenerative joint spurring. No fracture or bony destruction.  No calcific bursitis is seen.  Surgical staples are seen in the left inguinal region.  IMPRESSION: Nonaneurysmal arterial calcifications.  Degenerative spondylosis. Transitional vertebra with left assimilation joint.  Left hip osteophyte formation consistent with degenerative joint spurring.  Clinically significant discrepancy from primary report, if provided: None  Original Report Authenticated By: Crawford Givens, M.D.   Dg Knee 1-2 Views Left  05/25/2012  *RADIOLOGY REPORT*  Clinical Data: Knee pain of unknown origin.  LEFT KNEE - 1-2 VIEW  Comparison: None.  Findings: Nonaneurysmal arterial calcifications are seen.  Venous stasis calcifications are seen in the calf.  There is narrowing of the patellofemoral joint space.  There is slight narrowing of the medial joint space. There is patellofemoral osteophyte formation. There is osteopenic appearance of the bones.  No fracture or bony destruction is seen.  No chondrocalcinosis or opaque loose body is definitely seen. No definite joint effusion is evident.  IMPRESSION: Nonaneurysmal arterial calcifications and phleboliths. Patellofemoral osteoarthritic changes. Slight narrowing of medial joint space.  Osteopenic appearance of bones.   Clinically significant discrepancy from primary report, if provided: None  Original Report Authenticated By: Crawford Givens, M.D.   Dg Chest Portable 1 View  05/25/2012  *RADIOLOGY REPORT*  Clinical Data: Weakness and anemia.  PORTABLE CHEST - 1 VIEW  Comparison: 07/18/2009.  Findings: The cardiac silhouette, mediastinal and hilar contours are within normal limits and stable.  Stable emphysematous changes and pulmonary scarring.  No acute pulmonary process.  The bony thorax is intact.  IMPRESSION: Chronic lung changes but no acute pulmonary findings.  Original Report Authenticated By: P. Loralie Champagne, M.D.    EKG: Independently reviewed.  Assessment/Plan Principal Problem:  *GI bleeding Active Problems:  HYPERTENSION, BENIGN SYSTEMIC  ASTHMA, UNSPECIFIED, UNSPECIFIED STATUS  Anemia   1. Anemia, check iron studies, b12, folate, spep, upep,   2. Leukocytosis, ? uti (per outpatient?),  Cont rocephin  3. Hemeoccult: +:  Npo, GI consult  in am (apparently called by ED, they spoke with Lina Sar), appreciate input 4. Hypertension: cont present bp medications 5. Hyperlipidemia: cont statin 6. Asthma: cont inhalers  Code Status: Full code Family Communication: [name]   [phone] Disposition Plan: home when condition stable  Time spent: 45  Pearson Grippe [facility] [phone] tonight only  Otherwise call [facility] [email or web address] Password [email or web address] 05/25/2012, 10:41 PM

## 2012-05-25 NOTE — ED Notes (Signed)
CBG registered 376 on ED Glucometer

## 2012-05-25 NOTE — ED Provider Notes (Signed)
History     CSN: [id number]  Arrival date & time 05/25/12  1645   First MD Initiated Contact with Patient 05/25/12 1804      Chief Complaint  Patient presents with  . Hyperglycemia  . Urinary Tract Infection  . Weakness  . Anemia    (Consider location/radiation/quality/duration/timing/severity/associated sxs/prior treatment) Patient is a 78 y.o. female presenting with urinary tract infection, weakness, and anemia. The history is provided by the patient.  Urinary Tract Infection  Weakness  Additional symptoms include weakness.  Anemia  For the last 2 days, she has had weak spells and communication was found on the floor. She states she did not fall, but her legs just gave out on her. Her husband states that on one occasion, he had to catch her to keep her from falling. She denies any chest pain, heaviness, tightness, pressure. She denies being dizzy or lightheaded. There's been no nausea, vomiting, diarrhea. She has noted stools have been dark on a couple of occasions. She went to her PCP today and was noted to be anemic with hemoglobin of 6.9 and also was noted to have a urinary tract infection was given an injection of Rocephin. She has no history of prior GI bleeds. She takes aspirin, but no other antiplatelet agents and no other NSAIDs or anticoagulants.  Past Medical History  Diagnosis Date  . Anemia   . Hypertension   . GERD (gastroesophageal reflux disease)   . Diabetes mellitus   . Hyperlipidemia   . DVT (deep venous thrombosis)     Past Surgical History  Procedure Date  . Vascular surgery     No family history on file.  History  Substance Use Topics  . Smoking status: Never Smoker   . Smokeless tobacco: Not on file  . Alcohol Use: No    OB History    Grav Para Term Preterm Abortions TAB SAB Ect Mult Living                  Review of Systems  Neurological: Positive for weakness.  All other systems reviewed and are negative.    Allergies  Review  of patient's allergies indicates no known allergies.  Home Medications   Current Outpatient Rx  Name Route Sig Dispense Refill  . VITAMIN C 1000 MG PO TABS Oral Take 1,000 mg by mouth daily.    . ASPIRIN 325 MG PO TABS Oral Take 325 mg by mouth daily.    Marland Kitchen CALCIUM CARBONATE 600 MG PO TABS Oral Take 600 mg by mouth 2 (two) times daily with a meal.    . CEFTRIAXONE SODIUM 1 G IJ SOLR Intramuscular Inject 1 g into the muscle once.    . FENOFIBRATE 48 MG PO TABS Oral Take 48 mg by mouth daily.    Marland Kitchen METOPROLOL SUCCINATE ER 50 MG PO TB24 Oral Take 50 mg by mouth daily. Take with or immediately following a meal.    . MULTIVITAMINS PO CAPS Oral Take 1 capsule by mouth daily.      BP 120/46  Pulse 82  Temp 99.3 F (37.4 C) (Oral)  Resp 18  SpO2 98%  Physical Exam  Nursing note and vitals reviewed. 78 year old female, resting comfortably and in no acute distress. Vital signs are normal. Oxygen saturation is 98%, which is normal. Head is normocephalic and atraumatic. PERRLA, EOMI. conjunctivae are pink. Oropharynx is clear. Neck is nontender and supple without adenopathy or JVD. Back is nontender and there is no  CVA tenderness. Lungs are clear without rales, wheezes, or rhonchi. Chest is nontender. Heart has regular rate and rhythm without murmur. Abdomen is soft, flat, nontender without masses or hepatosplenomegaly and peristalsis is normoactive. Rectal exam shows normal sphincter tone, no masses or tenderness, greenish black stool is present which tests Hemoccult positive Extremities have no cyanosis or edema, full range of motion is present. Skin is warm and dry without rash. Neurologic: Mental status is normal, cranial nerves are intact, there are no motor or sensory deficits.   ED Course  Procedures (including critical care time)  Results for orders placed during the hospital encounter of 05/25/12  GLUCOSE, CAPILLARY      Component Value Range   Glucose-Capillary 376 (*) 70 - 99  mg/dL  CBC WITH DIFFERENTIAL      Component Value Range   WBC 14.6 (*) 4.0 - 10.5 K/uL   RBC 1.99 (*) 3.87 - 5.11 MIL/uL   Hemoglobin 6.5 (*) 12.0 - 15.0 g/dL   HCT 40.9 (*) 81.1 - 91.4 %   MCV 95.5  78.0 - 100.0 fL   MCH 32.7  26.0 - 34.0 pg   MCHC 34.2  30.0 - 36.0 g/dL   RDW 78.2  95.6 - 21.3 %   Platelets 269  150 - 400 K/uL   Neutrophils Relative 69  43 - 77 %   Neutro Abs 10.1 (*) 1.7 - 7.7 K/uL   Lymphocytes Relative 22  12 - 46 %   Lymphs Abs 3.3  0.7 - 4.0 K/uL   Monocytes Relative 7  3 - 12 %   Monocytes Absolute 1.1 (*) 0.1 - 1.0 K/uL   Eosinophils Relative 1  0 - 5 %   Eosinophils Absolute 0.1  0.0 - 0.7 K/uL   Basophils Relative 0  0 - 1 %   Basophils Absolute 0.0  0.0 - 0.1 K/uL  PROTIME-INR      Component Value Range   Prothrombin Time 13.7  11.6 - 15.2 seconds   INR 1.03  0.00 - 1.49  APTT      Component Value Range   aPTT <20 (*) 24 - 37 seconds  COMPREHENSIVE METABOLIC PANEL      Component Value Range   Sodium 135  135 - 145 mEq/L   Potassium 4.4  3.5 - 5.1 mEq/L   Chloride 99  96 - 112 mEq/L   CO2 27  19 - 32 mEq/L   Glucose, Bld 286 (*) 70 - 99 mg/dL   BUN 52 (*) 6 - 23 mg/dL   Creatinine, Ser 0.86 (*) 0.50 - 1.10 mg/dL   Calcium 9.6  8.4 - 57.8 mg/dL   Total Protein 6.2  6.0 - 8.3 g/dL   Albumin 3.5  3.5 - 5.2 g/dL   AST 17  0 - 37 U/L   ALT 11  0 - 35 U/L   Alkaline Phosphatase 40  39 - 117 U/L   Total Bilirubin 0.1 (*) 0.3 - 1.2 mg/dL   GFR calc non Af Amer 41 (*) >90 mL/min   GFR calc Af Amer 47 (*) >90 mL/min  RETICULOCYTES      Component Value Range   Retic Ct Pct 4.9 (*) 0.4 - 3.1 %   RBC. 1.99 (*) 3.87 - 5.11 MIL/uL   Retic Count, Manual 97.5  19.0 - 186.0 K/uL  TYPE AND SCREEN      Component Value Range   ABO/RH(D) O POS     Antibody Screen NEG  Sample Expiration 05/28/2012    OCCULT BLOOD, POC DEVICE      Component Value Range   Fecal Occult Bld POSITIVE    ABO/RH      Component Value Range   ABO/RH(D) O POS     Dg Hip  Complete Left  05/25/2012  *RADIOLOGY REPORT*  Clinical Data: Chronic hip pain.  LEFT HIP - COMPLETE 2+ VIEW  Comparison: None.  Findings: SI joints appear intact.  There is minimal spurring on the left at the pubic symphysis.  Pelvic phleboliths are present. Nonaneurysmal arterial calcifications are present.  There is transitional vertebra at the lumbosacral junction with formation of assimilation joint on the left with the sacrum.  There is minimal osteophyte formation associated with degenerative spondylosis.  Hip joint spaces are relatively preserved.  Osteophyte formation is seen involving the acetabulum and the femoral head consistent with degenerative joint spurring. No fracture or bony destruction.  No calcific bursitis is seen.  Surgical staples are seen in the left inguinal region.  IMPRESSION: Nonaneurysmal arterial calcifications.  Degenerative spondylosis. Transitional vertebra with left assimilation joint.  Left hip osteophyte formation consistent with degenerative joint spurring.  Clinically significant discrepancy from primary report, if provided: None  Original Report Authenticated By: Crawford Givens, M.D.   Dg Knee 1-2 Views Left  05/25/2012  *RADIOLOGY REPORT*  Clinical Data: Knee pain of unknown origin.  LEFT KNEE - 1-2 VIEW  Comparison: None.  Findings: Nonaneurysmal arterial calcifications are seen.  Venous stasis calcifications are seen in the calf.  There is narrowing of the patellofemoral joint space.  There is slight narrowing of the medial joint space. There is patellofemoral osteophyte formation. There is osteopenic appearance of the bones.  No fracture or bony destruction is seen.  No chondrocalcinosis or opaque loose body is definitely seen. No definite joint effusion is evident.  IMPRESSION: Nonaneurysmal arterial calcifications and phleboliths. Patellofemoral osteoarthritic changes. Slight narrowing of medial joint space.  Osteopenic appearance of bones.  Clinically significant  discrepancy from primary report, if provided: None  Original Report Authenticated By: Crawford Givens, M.D.   Dg Chest Portable 1 View  05/25/2012  *RADIOLOGY REPORT*  Clinical Data: Weakness and anemia.  PORTABLE CHEST - 1 VIEW  Comparison: 07/18/2009.  Findings: The cardiac silhouette, mediastinal and hilar contours are within normal limits and stable.  Stable emphysematous changes and pulmonary scarring.  No acute pulmonary process.  The bony thorax is intact.  IMPRESSION: Chronic lung changes but no acute pulmonary findings.  Original Report Authenticated By: P. Loralie Champagne, M.D.     Date: 05/25/2012  Rate: 81  Rhythm: normal sinus rhythm  QRS Axis: normal  Intervals: normal  ST/T Wave abnormalities: normal  Conduction Disutrbances:left bundle branch block  Narrative Interpretation: Left bundle-branch block. When compared with ECG of 07/18/2009, no significant changes are seen.  Old EKG Reviewed: unchanged    1. Upper GI bleed   2. Anemia, blood loss       MDM  Anemia and weakness. Stool is Hemoccult positive, so she clearly has a GI bleed as the major cause of her anemia. Normal RBC indices suggest that this is fairly recent, and review of prior records shows she had a hemoglobin over 12 in September of 2012. She is not on any medications other than aspirin which would predispose her to GI bleed. She will need to be admitted for evaluation and possible blood transfusion. She has been symptomatic at home with weakness and near falls and is not safe to  go home until her hemoglobin has risen.  Case is discussed with Dr. Dickie La of [facility] who will see her in consultation tomorrow. Case discussed with Dr. Selena Batten of [facility] who agrees to admit the patient.     Dione Booze, MD 05/25/12 2154

## 2012-05-25 NOTE — ED Notes (Signed)
Pt's husband reports pt has had multiple weak spells over weekend. Was seen at [facility] today. Found to be hyperglycemic, anemic, Hgb 6.9, and had a UTI. 1gm Rocephin IM given in office.

## 2012-05-26 ENCOUNTER — Encounter (HOSPITAL_COMMUNITY): Payer: Self-pay

## 2012-05-26 ENCOUNTER — Encounter (HOSPITAL_COMMUNITY)
Admission: EM | Disposition: A | Discharge status: Home / Self Care | Payer: Self-pay | Source: Home / Self Care | Attending: Internal Medicine

## 2012-05-26 DIAGNOSIS — E1165 Type 2 diabetes mellitus with hyperglycemia: Secondary | ICD-10-CM

## 2012-05-26 DIAGNOSIS — D649 Anemia, unspecified: Secondary | ICD-10-CM

## 2012-05-26 DIAGNOSIS — D62 Acute posthemorrhagic anemia: Secondary | ICD-10-CM | POA: Diagnosis present

## 2012-05-26 DIAGNOSIS — K922 Gastrointestinal hemorrhage, unspecified: Secondary | ICD-10-CM

## 2012-05-26 DIAGNOSIS — I10 Essential (primary) hypertension: Secondary | ICD-10-CM

## 2012-05-26 DIAGNOSIS — R1310 Dysphagia, unspecified: Secondary | ICD-10-CM

## 2012-05-26 DIAGNOSIS — D126 Benign neoplasm of colon, unspecified: Secondary | ICD-10-CM

## 2012-05-26 DIAGNOSIS — D500 Iron deficiency anemia secondary to blood loss (chronic): Secondary | ICD-10-CM

## 2012-05-26 HISTORY — PX: ESOPHAGOGASTRODUODENOSCOPY: SHX5428

## 2012-05-26 LAB — COMPREHENSIVE METABOLIC PANEL WITH GFR
BUN: 58 mg/dL — ABNORMAL HIGH (ref 6–23)
CO2: 26 meq/L (ref 19–32)
Calcium: 9.5 mg/dL (ref 8.4–10.5)
Chloride: 103 meq/L (ref 96–112)
Creat: 1.32 mg/dL — ABNORMAL HIGH (ref 0.50–1.10)
Glucose, Bld: 401 mg/dL — ABNORMAL HIGH (ref 70–99)
Total Bilirubin: 0.2 mg/dL — ABNORMAL LOW (ref 0.3–1.2)

## 2012-05-26 LAB — CBC
HCT: 24.1 % — ABNORMAL LOW (ref 36.0–46.0)
HCT: 25 % — ABNORMAL LOW (ref 36.0–46.0)
Hemoglobin: 8.5 g/dL — ABNORMAL LOW (ref 12.0–15.0)
Hemoglobin: 8.4 g/dL — ABNORMAL LOW (ref 12.0–15.0)
MCH: 30.7 pg (ref 26.0–34.0)
MCHC: 33.6 g/dL (ref 30.0–36.0)
RBC: 2.74 MIL/uL — ABNORMAL LOW (ref 3.87–5.11)
WBC: 12.6 10*3/uL — ABNORMAL HIGH (ref 4.0–10.5)

## 2012-05-26 LAB — IRON AND TIBC
Iron: 99 ug/dL (ref 42–135)
TIBC: 291 ug/dL (ref 250–470)

## 2012-05-26 LAB — COMPREHENSIVE METABOLIC PANEL
ALT: 11 U/L (ref 0–35)
ALT: 9 U/L (ref 0–35)
AST: 16 U/L (ref 0–37)
Albumin: 3.4 g/dL — ABNORMAL LOW (ref 3.5–5.2)
Albumin: 2.9 g/dL — ABNORMAL LOW (ref 3.5–5.2)
Alkaline Phosphatase: 34 U/L — ABNORMAL LOW (ref 39–117)
BUN: 43 mg/dL — ABNORMAL HIGH (ref 6–23)
Calcium: 8.7 mg/dL (ref 8.4–10.5)
GFR calc Af Amer: 46 mL/min — ABNORMAL LOW (ref >90)
Glucose, Bld: 138 mg/dL — ABNORMAL HIGH (ref 70–99)
Potassium: 4.6 mEq/L (ref 3.5–5.3)
Sodium: 136 mEq/L (ref 135–145)
Sodium: 140 mEq/L (ref 135–145)
Total Protein: 5.9 g/dL — ABNORMAL LOW (ref 6.0–8.3)
Total Protein: 5.3 g/dL — ABNORMAL LOW (ref 6.0–8.3)

## 2012-05-26 LAB — URINE MICROSCOPIC-ADD ON

## 2012-05-26 LAB — DIFFERENTIAL
Lymphocytes Relative: 28 % (ref 12–46)
Monocytes Absolute: 1.2 10*3/uL — ABNORMAL HIGH (ref 0.1–1.0)
Monocytes Relative: 9 % (ref 3–12)
Neutro Abs: 7.2 10*3/uL (ref 1.7–7.7)

## 2012-05-26 LAB — URINALYSIS, ROUTINE W REFLEX MICROSCOPIC
Glucose, UA: 250 mg/dL — AB
Hgb urine dipstick: NEGATIVE
Protein, ur: NEGATIVE mg/dL
pH: 6 (ref 5.0–8.0)

## 2012-05-26 LAB — VITAMIN B12: Vitamin B-12: 903 pg/mL (ref 211–911)

## 2012-05-26 LAB — GLUCOSE, CAPILLARY: Glucose-Capillary: 136 mg/dL — ABNORMAL HIGH (ref 70–99)

## 2012-05-26 LAB — FERRITIN: Ferritin: 501 ng/mL — ABNORMAL HIGH (ref 10–291)

## 2012-05-26 SURGERY — EGD (ESOPHAGOGASTRODUODENOSCOPY)
Anesthesia: Moderate Sedation

## 2012-05-26 MED ORDER — PANTOPRAZOLE SODIUM 40 MG IV SOLR
40.0000 mg | INTRAVENOUS | Status: DC
  Administered 2012-05-27 – 2012-05-28 (×2): 40 mg via INTRAVENOUS
  Filled 2012-05-26 (×3): qty 40

## 2012-05-26 MED ORDER — SODIUM CHLORIDE 0.9 % IV SOLN: INTRAVENOUS | Status: DC

## 2012-05-26 MED ORDER — PEG-KCL-NACL-NASULF-NA ASC-C 100 G PO SOLR
1.0000 | Freq: Once | ORAL | Status: AC
  Administered 2012-05-26: 100 g via ORAL
  Filled 2012-05-26: qty 1

## 2012-05-26 MED ORDER — BUTAMBEN-TETRACAINE-BENZOCAINE 2-2-14 % EX AERO
INHALATION_SPRAY | CUTANEOUS | Status: DC | PRN
  Administered 2012-05-26: 2 via TOPICAL

## 2012-05-26 MED ORDER — MIDAZOLAM HCL 10 MG/2ML IJ SOLN
INTRAMUSCULAR | Status: AC
  Filled 2012-05-26: qty 2

## 2012-05-26 MED ORDER — FENTANYL CITRATE 0.05 MG/ML IJ SOLN
INTRAMUSCULAR | Status: DC | PRN
  Administered 2012-05-26: 25 ug via INTRAVENOUS

## 2012-05-26 MED ORDER — FENTANYL CITRATE 0.05 MG/ML IJ SOLN
INTRAMUSCULAR | Status: AC
  Filled 2012-05-26: qty 2

## 2012-05-26 MED ORDER — DIPHENHYDRAMINE HCL 50 MG/ML IJ SOLN
INTRAMUSCULAR | Status: AC
  Filled 2012-05-26: qty 1

## 2012-05-26 MED ORDER — MIDAZOLAM HCL 10 MG/2ML IJ SOLN
INTRAMUSCULAR | Status: DC | PRN
  Administered 2012-05-26: 2 mg via INTRAVENOUS

## 2012-05-26 NOTE — Progress Notes (Signed)
Patient ID: [name], female   DOB: [birth year], 78 y.o.   MRN: [id number]   Records received from Dr. Haywood Pao office- pt had EGD 04/2008 showing a distal esophageal stricture which was Savary dilated to 14 mm,and duodenitis  Colonoscopy 04/2008 showed  2 5mm polyps in descending colon which were removed and internal hemorrhoids. Path; Adenomatous polyps,  Peptic duodenitis.  EGD  today- no definite source for bleeding  Found- pt scheduled for colonoscopy tomorrow pm

## 2012-05-26 NOTE — Progress Notes (Signed)
Assessment/Plan: 1. GI bleeding/Melena For EGD this am per Dr.Brodie Protonix 40mg  IV Q12 ASA on hold  2. Anemia: acute blood loss from 1, s/p 2units overnight, CBC pending this am, change CBC to Q12 Anemia panel ok  3. Leukocytosis: suspect reactive, check UA, afebrile, continue to monitor  4. HTN: continue Toprol  5. DM: SSI, not on any meds at home, check AiC  6.DVT prophylaxis: SCDs  Dispo: home when stable   LOS: 1 day   Calisha Tindel [facility] Pager: [phone] 05/26/2012, 8:36 AM      Subjective: Admitted last night w/ weakness, dark stools for 2-3days and anemia, currently doing ok, transfused 2 Units PRBC overnight, Takes ASA 325mg  daily, denies NSAIDs  Objective: Vital signs in last 24 hours: Temp:  [97.7 F (36.5 C)-99.4 F (37.4 C)] 97.9 F (36.6 C) (08/06 0645) Pulse Rate:  [65-91] 65  (08/06 0645) Resp:  [12-22] 16  (08/06 0645) BP: (92-149)/(39-77) 118/60 mmHg (08/06 0645) SpO2:  [95 %-100 %] 98 % (08/05 2335) Weight:  [61.326 kg (135 lb 3.2 oz)-62.596 kg (138 lb)] 61.326 kg (135 lb 3.2 oz) (08/06 0630) Weight change:  Last BM Date: 05/25/12  Intake/Output from previous day: 08/05 0701 - 08/06 0700 In: 652.1 [I.V.:18.8; Blood:633.3] Out: -      Physical Exam: General: Alert, awake, oriented x3, in no acute distress. HEENT: No bruits, no goiter. Heart: Regular rate and rhythm, without murmurs, rubs, gallops. Lungs: Clear to auscultation bilaterally. Abdomen: Soft, nontender, nondistended, positive bowel sounds. Extremities: No clubbing cyanosis or edema with positive pedal pulses. Neuro: Grossly intact, nonfocal.    Lab Results: Basic Metabolic Panel:  Basename 05/25/12 1907 05/25/12 1440  NA 135 136  K 4.4 4.6  CL 99 103  CO2 27 26  GLUCOSE 286* 401*  BUN 52* 58*  CREATININE 1.23* 1.32*  CALCIUM 9.6 9.5  MG -- --  PHOS -- --   Liver Function Tests:  Basename 05/25/12 1907 05/25/12 1440  AST 17 16  ALT 11 11    ALKPHOS 40 34*  BILITOT 0.1* 0.2*  PROT 6.2 5.9*  ALBUMIN 3.5 3.4*   No results found for this basename: LIPASE:2,AMYLASE:2 in the last 72 hours No results found for this basename: AMMONIA:2 in the last 72 hours CBC:  Basename 05/26/12 0815 05/25/12 1907  WBC 12.6* 14.6*  NEUTROABS 7.2 10.1*  HGB 8.5* 6.5*  HCT 24.1* 19.0*  MCV 89.6 95.5  PLT 196 269   Cardiac Enzymes: No results found for this basename: CKTOTAL:3,CKMB:3,CKMBINDEX:3,TROPONINI:3 in the last 72 hours BNP: No results found for this basename: PROBNP:3 in the last 72 hours D-Dimer: No results found for this basename: DDIMER:2 in the last 72 hours CBG:  Basename 05/26/12 0803 05/25/12 1707  GLUCAP 140* 376*   Hemoglobin A1C:  Basename 05/25/12 1855  HGBA1C 6.9*   Fasting Lipid Panel: No results found for this basename: CHOL,HDL,LDLCALC,TRIG,CHOLHDL,LDLDIRECT in the last 72 hours Thyroid Function Tests: No results found for this basename: TSH,T4TOTAL,FREET4,T3FREE,THYROIDAB in the last 72 hours Anemia Panel:  [facility] 05/25/12 1907  VITAMINB12 903  FOLATE >20.0  FERRITIN 501*  TIBC 291  IRON 99  RETICCTPCT 4.9*   Coagulation:  Basename 05/25/12 1907  LABPROT 13.7  INR 1.03   Urine Drug Screen: Drugs of Abuse  No results found for this basename: labopia, cocainscrnur, labbenz, amphetmu, thcu, labbarb    Alcohol Level: No results found for this basename: ETH:2 in the last 72 hours Urinalysis:  Basename 05/25/12 1444  COLORURINE --  LABSPEC --  PHURINE --  GLUCOSEU --  HGBUR --  BILIRUBINUR neg  KETONESUR --  PROTEINUR --  UROBILINOGEN 0.2  NITRITE positive  LEUKOCYTESUR Negative    No results found for this or any previous visit (from the past 240 hour(s)).  Studies/Results: Dg Hip Complete Left  05/25/2012  *RADIOLOGY REPORT*  Clinical Data: Chronic hip pain.  LEFT HIP - COMPLETE 2+ VIEW  Comparison: None.  Findings: SI joints appear intact.  There is minimal spurring on the  left at the pubic symphysis.  Pelvic phleboliths are present. Nonaneurysmal arterial calcifications are present.  There is transitional vertebra at the lumbosacral junction with formation of assimilation joint on the left with the sacrum.  There is minimal osteophyte formation associated with degenerative spondylosis.  Hip joint spaces are relatively preserved.  Osteophyte formation is seen involving the acetabulum and the femoral head consistent with degenerative joint spurring. No fracture or bony destruction.  No calcific bursitis is seen.  Surgical staples are seen in the left inguinal region.  IMPRESSION: Nonaneurysmal arterial calcifications.  Degenerative spondylosis. Transitional vertebra with left assimilation joint.  Left hip osteophyte formation consistent with degenerative joint spurring.  Clinically significant discrepancy from primary report, if provided: None  Original Report Authenticated By: Crawford Givens, M.D.   Dg Knee 1-2 Views Left  05/25/2012  *RADIOLOGY REPORT*  Clinical Data: Knee pain of unknown origin.  LEFT KNEE - 1-2 VIEW  Comparison: None.  Findings: Nonaneurysmal arterial calcifications are seen.  Venous stasis calcifications are seen in the calf.  There is narrowing of the patellofemoral joint space.  There is slight narrowing of the medial joint space. There is patellofemoral osteophyte formation. There is osteopenic appearance of the bones.  No fracture or bony destruction is seen.  No chondrocalcinosis or opaque loose body is definitely seen. No definite joint effusion is evident.  IMPRESSION: Nonaneurysmal arterial calcifications and phleboliths. Patellofemoral osteoarthritic changes. Slight narrowing of medial joint space.  Osteopenic appearance of bones.  Clinically significant discrepancy from primary report, if provided: None  Original Report Authenticated By: Crawford Givens, M.D.   Dg Chest Portable 1 View  05/25/2012  *RADIOLOGY REPORT*  Clinical Data: Weakness and anemia.   PORTABLE CHEST - 1 VIEW  Comparison: 07/18/2009.  Findings: The cardiac silhouette, mediastinal and hilar contours are within normal limits and stable.  Stable emphysematous changes and pulmonary scarring.  No acute pulmonary process.  The bony thorax is intact.  IMPRESSION: Chronic lung changes but no acute pulmonary findings.  Original Report Authenticated By: P. Loralie Champagne, M.D.    Medications: Scheduled Meds:   . sodium chloride   Intravenous Once  . acetaminophen  650 mg Oral Once  . calcium carbonate  1 tablet Oral BID WC  . diphenhydrAMINE  25 mg Oral Once  . fenofibrate  54 mg Oral Daily  . furosemide  20 mg Intravenous Once  . insulin aspart  0-9 Units Subcutaneous TID WC  . metoprolol succinate  50 mg Oral Daily  . pantoprazole (PROTONIX) IV  40 mg Intravenous Once  . pantoprazole (PROTONIX) IV  40 mg Intravenous Q12H  . sodium chloride  3 mL Intravenous Q12H  . vitamin C  1,000 mg Oral Daily  . DISCONTD: aspirin  325 mg Oral Daily  . DISCONTD: calcium carbonate  600 mg Oral BID WC   Continuous Infusions:   . sodium chloride 75 mL/hr at 05/26/12 0645   PRN Meds:.

## 2012-05-26 NOTE — Op Note (Signed)
[facility] [address] [place], [place]  [address]  ENDOSCOPY PROCEDURE REPORT  PATIENT:  [name], [name]  MR#:  [id number] BIRTHDATE:  1931-04-08, 81 yrs. old  GENDER:  female  ENDOSCOPIST:  Hedwig Morton. Juanda Chance, MD Referred by:  Pearson Grippe, M.D.  PROCEDURE DATE:  05/26/2012 PROCEDURE:  EGD with biopsy, 43239 ASA CLASS:  Class II INDICATIONS:  anemia, hemoccult positive stool, dysphagia Hgb 6.5, has been on ASA, gives hx of prir EGD/colon, records requested  MEDICATIONS:   These medications were titrated to patient response per physician's verbal order, Versed 2 mg, Fentanyl 25 mcg TOPICAL ANESTHETIC:  Cetacaine Spray  DESCRIPTION OF PROCEDURE:   After the risks benefits and alternatives of the procedure were thoroughly explained, informed consent was obtained.  The Pentax Gastroscope [id number] endoscope was introduced through the mouth and advanced to the second portion of the duodenum, without limitations.  The instrument was slowly withdrawn as the mucosa was fully examined. <<PROCEDUREIMAGES>>  A stricture was found in the distal esophagus. benign appearing distal esophageal stricture at 35 cm, admitted scope with pressure/resistance,small amount of bleeding from the stricture after the scope passed With standard forceps, a biopsy was obtained and sent to pathology (see image1, image3, image7, and image5). r/o Barrett's  Otherwise the examination was normal. With standard forceps, a biopsy was obtained and sent to pathology (see image2 and image4). small bowl biopsies to r/o villous atrophy Retroflexed views revealed no abnormalities.    The scope was then withdrawn from the patient and the procedure completed.  COMPLICATIONS:  None  ENDOSCOPIC IMPRESSION: 1) Stricture in the distal esophagus 2) Otherwise normal examination stricture not dilated except with the endoscope nothing to account for the heme positive anemia RECOMMENDATIONS: 1) Await biopsy  results will rewiew past colonoscopy report to see if colon needs to be repeated she will need an esoph dilation in near future PPI  REPEAT EXAM:  In 4 week(s) for.  es.dilation in 4 weeks  ______________________________ Hedwig Morton. Juanda Chance, MD  CC:  n. eSIGNED:   Hedwig Morton. Ethaniel Garfield at 05/26/2012 12:26 PM  [name], [phone]

## 2012-05-26 NOTE — Consult Note (Signed)
Referring Provider: [facility] Primary Care Physician:  Elvina Sidle, MD Primary Gastroenterologist:  none   Reason for Consultation:  [name] Bleed  HPI: [name] is a 78 y.o. female admitted through the emergency room last night. She presented with complaints of onset of weakness on Saturday which has been progressive. She noted exertional dyspnea but denies any chest pain. She has had lightheadedness and some dizziness but no syncope. She had noted that her stools were dark over the past several days but was unaware that this was blood. She does take one whole aspirin daily, denies any other NSAID or blood thinner use. She was seen and evaluated at urgent care last evening and then sent to the emergency room. Her hemoglobin was found to be 6.5 hematocrit of 19 WBC of 14.6, BUN 43 creatinine 1.25. Iron studies have been done yesterday were normal B12 and folate also normal. Patient had had outpatient labs done in June of 2013 at that time had hemoglobin of 12.4 hematocrit of 36.4  Patient does have a history of acid reflux and uses a when necessary acid blocker She denies any current problems with heartburn indigestion nausea vomiting or abdominal pain. She had not noted any changes in her bowel habits but had noted very dark stools over the past several days.  Patient had seen Dr. Elnoria Howard in the past, she and her husband think this was between 3 and 5 years ago. She did have an EGD and colonoscopy done and was told that she had GERD. She has not seen Dr. Elnoria Howard since that time.   Past Medical History  Diagnosis Date  . Anemia   . Hypertension   . GERD (gastroesophageal reflux disease)   . Diabetes mellitus   . Hyperlipidemia   . DVT (deep venous thrombosis) 03/2005  . Glaucoma   . Cataract   . Asthma     Past Surgical History  Procedure Date  . Vascular surgery 09/2004  . Wrist fracture surgery     R wrist fracture, plate    Prior to Admission medications   Medication Sig  Start Date End Date Taking? Authorizing Provider  Ascorbic Acid (VITAMIN C) 1000 MG tablet Take 1,000 mg by mouth daily.   Yes Historical Provider, MD  aspirin 325 MG tablet Take 325 mg by mouth daily.   Yes Historical Provider, MD  calcium carbonate (OS-CAL) 600 MG TABS Take 600 mg by mouth 2 (two) times daily with a meal.   Yes Historical Provider, MD  cefTRIAXone (ROCEPHIN) 1 G injection Inject 1 g into the muscle once.   Yes Historical Provider, MD  fenofibrate (TRICOR) 48 MG tablet Take 48 mg by mouth daily.   Yes Historical Provider, MD  metoprolol succinate (TOPROL-XL) 50 MG 24 hr tablet Take 50 mg by mouth daily. Take with or immediately following a meal.   Yes Historical Provider, MD  Multiple Vitamin (MULTIVITAMIN) capsule Take 1 capsule by mouth daily.   Yes Historical Provider, MD    Current Facility-Administered Medications  Medication Dose Route Frequency Provider Last Rate Last Dose  . 0.9 %  sodium chloride infusion   Intravenous Once Dione Booze, MD 150 mL/hr at 05/25/12 1941 1,000 mL at 05/25/12 1941  . 0.9 %  sodium chloride infusion   Intravenous Continuous Massie Maroon, MD 75 mL/hr at 05/26/12 [phone]    . acetaminophen (TYLENOL) tablet 650 mg  650 mg Oral Once Massie Maroon, MD   650 mg at 05/26/12 0148  . calcium  carbonate (OS-CAL - dosed in mg of elemental calcium) tablet 500 mg of elemental calcium  1 tablet Oral BID WC Massie Maroon, MD      . diphenhydrAMINE (BENADRYL) capsule 25 mg  25 mg Oral Once Massie Maroon, MD   25 mg at 05/26/12 0149  . fenofibrate tablet 54 mg  54 mg Oral Daily Massie Maroon, MD      . furosemide (LASIX) injection 20 mg  20 mg Intravenous Once Massie Maroon, MD   20 mg at 05/26/12 0403  . insulin aspart (novoLOG) injection 0-9 Units  0-9 Units Subcutaneous TID WC Massie Maroon, MD   1 Units at 05/26/12 [phone]  . metoprolol succinate (TOPROL-XL) 24 hr tablet 50 mg  50 mg Oral Daily Massie Maroon, MD      . pantoprazole (PROTONIX) injection 40 mg  40 mg  Intravenous Once Dione Booze, MD   40 mg at 05/25/12 2041  . pantoprazole (PROTONIX) injection 40 mg  40 mg Intravenous Q12H Massie Maroon, MD   40 mg at 05/26/12 0403  . sodium chloride 0.9 % injection 3 mL  3 mL Intravenous Q12H Massie Maroon, MD      . vitamin C (ASCORBIC ACID) tablet 1,000 mg  1,000 mg Oral Daily Massie Maroon, MD      . DISCONTD: aspirin tablet 325 mg  325 mg Oral Daily Massie Maroon, MD      . DISCONTD: calcium carbonate (OS-CAL) tablet 600 mg  600 mg Oral BID WC Massie Maroon, MD        Allergies as of 05/25/2012  . (No Known Allergies)    Family History  Problem Relation Age of Onset  . Adopted: Yes  . Family history unknown: Yes    History   Social History  . Marital Status: Married    Spouse Name: N/A    Number of Children: N/A  . Years of Education: N/A   Occupational History  . Not on file.   Social History Main Topics  . Smoking status: Never Smoker   . Smokeless tobacco: Never Used  . Alcohol Use: No  . Drug Use: Not on file  . Sexually Active: Not on file   Other Topics Concern  . Not on file   Social History Narrative   Born: [place], [place]    Review of Systems: Pertinent positive and negative review of systems were noted in the above HPI section.  All other review of systems was otherwise negative. Physical Exam: Vital signs in last 24 hours: Temp:  [97.7 F (36.5 C)-99.4 F (37.4 C)] 97.9 F (36.6 C) (08/06 0645) Pulse Rate:  [65-91] 65  (08/06 0645) Resp:  [12-22] 16  (08/06 0645) BP: (92-149)/(39-77) 118/60 mmHg (08/06 0645) SpO2:  [95 %-100 %] 98 % (08/05 2335) Weight:  [135 lb 3.2 oz (61.326 kg)-138 lb (62.596 kg)] 135 lb 3.2 oz (61.326 kg) (08/06 0630) Last BM Date: 05/25/12 General:   Alert,  Well-developed, well-nourished, pleasant and cooperative in NAD ,talkative Head:  Normocephalic and atraumatic. Eyes:  Sclera clear, no icterus.   Conjunctiva pale Ears:  Normal auditory acuity. Nose:  No deformity, discharge,  or  lesions. Mouth:  No deformity or lesions.   Neck:  Supple; no masses or thyromegaly. Lungs:  Clear throughout to auscultation.   No wheezes, crackles, or rhonchi. Heart:  Regular rate and rhythm; no murmurs, clicks, rubs,  or gallops. Abdomen:  Soft,nontender, BS active,nonpalp  mass or hsm.   Rectal:  Deferred dark heme + stool documented in ER Msk:  Symmetrical without gross deformities. . Pulses:  Normal pulses noted. Extremities:  Without clubbing or edema. Neurologic:  Alert and  oriented x4;  grossly normal neurologically. Skin:  Intact without significant lesions or rashes.. Psych:  Alert and cooperative. Normal mood and affect.  Intake/Output from previous day: 08/05 0701 - 08/06 0700 In: 652.1 [I.V.:18.8; Blood:633.3] Out: -  Intake/Output this shift:    Lab Results:  Basename 05/26/12 0815 05/25/12 1907 05/25/12 1441  WBC 12.6* 14.6* 17.7*  HGB 8.5* 6.5* 6.9*  HCT 24.1* 19.0* 23.1*  PLT 196 269 --   BMET  Basename 05/26/12 0815 05/25/12 1907 05/25/12 1440  NA 140 135 136  K 3.8 4.4 4.6  CL 105 99 103  CO2 28 27 26   GLUCOSE 138* 286* 401*  BUN 43* 52* 58*  CREATININE 1.25* 1.23* 1.32*  CALCIUM 8.7 9.6 9.5   LFT  Basename 05/26/12 0815  PROT 5.3*  ALBUMIN 2.9*  AST 16  ALT 9  ALKPHOS 37*  BILITOT 0.4  BILIDIR --  IBILI --   PT/INR  Basename 05/25/12 1907  LABPROT 13.7  INR 1.03      Studies/Results: Dg Hip Complete Left  05/25/2012  *RADIOLOGY REPORT*  Clinical Data: Chronic hip pain.  LEFT HIP - COMPLETE 2+ VIEW  Comparison: None.  Findings: SI joints appear intact.  There is minimal spurring on the left at the pubic symphysis.  Pelvic phleboliths are present. Nonaneurysmal arterial calcifications are present.  There is transitional vertebra at the lumbosacral junction with formation of assimilation joint on the left with the sacrum.  There is minimal osteophyte formation associated with degenerative spondylosis.  Hip joint spaces are relatively  preserved.  Osteophyte formation is seen involving the acetabulum and the femoral head consistent with degenerative joint spurring. No fracture or bony destruction.  No calcific bursitis is seen.  Surgical staples are seen in the left inguinal region.  IMPRESSION: Nonaneurysmal arterial calcifications.  Degenerative spondylosis. Transitional vertebra with left assimilation joint.  Left hip osteophyte formation consistent with degenerative joint spurring.  Clinically significant discrepancy from primary report, if provided: None  Original Report Authenticated By: Crawford Givens, M.D.   Dg Knee 1-2 Views Left  05/25/2012  *RADIOLOGY REPORT*  Clinical Data: Knee pain of unknown origin.  LEFT KNEE - 1-2 VIEW  Comparison: None.  Findings: Nonaneurysmal arterial calcifications are seen.  Venous stasis calcifications are seen in the calf.  There is narrowing of the patellofemoral joint space.  There is slight narrowing of the medial joint space. There is patellofemoral osteophyte formation. There is osteopenic appearance of the bones.  No fracture or bony destruction is seen.  No chondrocalcinosis or opaque loose body is definitely seen. No definite joint effusion is evident.  IMPRESSION: Nonaneurysmal arterial calcifications and phleboliths. Patellofemoral osteoarthritic changes. Slight narrowing of medial joint space.  Osteopenic appearance of bones.  Clinically significant discrepancy from primary report, if provided: None  Original Report Authenticated By: Crawford Givens, M.D.   Dg Chest Portable 1 View  05/25/2012  *RADIOLOGY REPORT*  Clinical Data: Weakness and anemia.  PORTABLE CHEST - 1 VIEW  Comparison: 07/18/2009.  Findings: The cardiac silhouette, mediastinal and hilar contours are within normal limits and stable.  Stable emphysematous changes and pulmonary scarring.  No acute pulmonary process.  The bony thorax is intact.  IMPRESSION: Chronic lung changes but no acute pulmonary findings.  Original Report  Authenticated  By: P. Loralie Champagne, M.D.    IMPRESSION:  #41 78 year old female with acute GI bleed with melena and profound normocytic anemia. Suspect aspirin-induced peptic ulcer disease or gastropathy. Cannot rule out occult neoplasm. #2 history of GERD #3 asthma/COPD #4 hypertension #5 peripheral vascular disease #6 adult onset diabetes mellitus #7 osteoarthritis  PLAN: #1 Patient has been transfused 2 units of packed rbc's in her hemoglobin is up to 8.5 this morning, would keep hemoglobin between 8 and 9. #2 continue IV Protonix 40 mg every 12 hours #3 patient is scheduled for upper endoscopy this morning with Dr. Juanda Chance, Procedure has been discussed in detail with the patient and her husband and they are agreeable to proceed. If EGD is unremarkable she will need colonoscopy #4 hold aspirin #5 obtain records from Dr. Haywood Pao office regarding prior EGD and colonoscopy    Amy [facility]  05/26/2012, 9:57 AM  I have reviewed the above note, examined the patient and agree with plan of treatment. Acute UGI bleed most likely, since BUN elevated . Possible ASA related gastric injury. She is stable hemodynamically. No specific GI complaints other than occasional dysphagia. Will proceed with upper endoscopy today

## 2012-05-26 NOTE — Progress Notes (Signed)
CARE MANAGEMENT NOTE 05/26/2012  Patient:  [name],[name]   Account Number:  [id number]  Date Initiated:  05/26/2012  Documentation initiated by:  Colleen Can  Subjective/Objective Assessment:   dx severe anemia   PCP-Dr Elvina Sidle     Action/Plan:   Home upon discharge/lives with spouse   Anticipated DC Date:  05/26/2012   Anticipated DC Plan:  HOME/SELF CARE        Status of service:  In process, will continue to follow Medicare Important Message given?   (If response is "NO", the following Medicare IM given date fields will be blank)  Per UR Regulation:  Reviewed for med. necessity/level of care/duration of stay

## 2012-05-26 NOTE — Interval H&P Note (Signed)
History and Physical Interval Note:  05/26/2012 11:38 AM  [name]  has presented today for surgery, with the diagnosis of anemia, heme positive stool  The various methods of treatment have been discussed with the patient and family. After consideration of risks, benefits and other options for treatment, the patient has consented to  Procedure(s) (LRB): ESOPHAGOGASTRODUODENOSCOPY (EGD) (N/A) as a surgical intervention .  The patient's history has been reviewed, patient examined, no change in status, stable for surgery.  I have reviewed the patient's chart and labs.  Questions were answered to the patient's satisfaction.     Lina Sar

## 2012-05-27 ENCOUNTER — Encounter (HOSPITAL_COMMUNITY): Payer: Self-pay

## 2012-05-27 ENCOUNTER — Encounter: Payer: Self-pay | Admitting: Internal Medicine

## 2012-05-27 ENCOUNTER — Encounter (HOSPITAL_COMMUNITY)
Admission: EM | Disposition: A | Discharge status: Home / Self Care | Payer: Self-pay | Source: Home / Self Care | Attending: Internal Medicine

## 2012-05-27 ENCOUNTER — Encounter (HOSPITAL_COMMUNITY): Payer: Self-pay | Admitting: Internal Medicine

## 2012-05-27 DIAGNOSIS — D126 Benign neoplasm of colon, unspecified: Secondary | ICD-10-CM

## 2012-05-27 HISTORY — PX: COLONOSCOPY: SHX5424

## 2012-05-27 LAB — CBC
Hemoglobin: 8.2 g/dL — ABNORMAL LOW (ref 12.0–15.0)
MCH: 24 pg — ABNORMAL LOW (ref 26.0–34.0)
MCHC: 33.3 g/dL (ref 30.0–36.0)
Platelets: 192 10*3/uL (ref 150–400)
Platelets: 239 10*3/uL (ref 150–400)
RBC: 4.75 MIL/uL (ref 3.87–5.11)
RBC: 2.62 MIL/uL — ABNORMAL LOW (ref 3.87–5.11)
RDW: 19.7 % — ABNORMAL HIGH (ref 11.5–15.5)
WBC: 11.4 10*3/uL — ABNORMAL HIGH (ref 4.0–10.5)

## 2012-05-27 LAB — BASIC METABOLIC PANEL
BUN: 22 mg/dL (ref 6–23)
CO2: 26 mEq/L (ref 19–32)
Chloride: 108 mEq/L (ref 96–112)
Creatinine, Ser: 1.01 mg/dL (ref 0.50–1.10)

## 2012-05-27 LAB — GLUCOSE, CAPILLARY
Glucose-Capillary: 123 mg/dL — ABNORMAL HIGH (ref 70–99)
Glucose-Capillary: 95 mg/dL (ref 70–99)
Glucose-Capillary: 193 mg/dL — ABNORMAL HIGH (ref 70–99)

## 2012-05-27 SURGERY — COLONOSCOPY
Anesthesia: Moderate Sedation

## 2012-05-27 MED ORDER — SODIUM CHLORIDE 0.9 % IV SOLN: INTRAVENOUS | Status: DC

## 2012-05-27 MED ORDER — MIDAZOLAM HCL 10 MG/2ML IJ SOLN
INTRAMUSCULAR | Status: AC
  Filled 2012-05-27: qty 2

## 2012-05-27 MED ORDER — FENTANYL CITRATE 0.05 MG/ML IJ SOLN
INTRAMUSCULAR | Status: DC | PRN
  Administered 2012-05-27 (×2): 25 ug via INTRAVENOUS

## 2012-05-27 MED ORDER — MIDAZOLAM HCL 10 MG/2ML IJ SOLN
INTRAMUSCULAR | Status: DC | PRN
  Administered 2012-05-27: 1 mg via INTRAVENOUS
  Administered 2012-05-27: 2 mg via INTRAVENOUS

## 2012-05-27 MED ORDER — FENTANYL CITRATE 0.05 MG/ML IJ SOLN
INTRAMUSCULAR | Status: AC
  Filled 2012-05-27: qty 2

## 2012-05-27 NOTE — Progress Notes (Signed)
Patient ID: [name], female   DOB: July 08, 1931, 78 y.o.   MRN: [id number] [facility] Progress Note  Subjective: She feels fine, no complaints-tolerated prep well. Pt had 5 beats  VTach during night asymptomatic,NSR since  Objective:  Vital signs in last 24 hours: Temp:  [98.2 F (36.8 C)-98.5 F (36.9 C)] 98.2 F (36.8 C) (08/07 0539) Pulse Rate:  [62-78] 69  (08/07 0539) Resp:  [5-24] 18  (08/07 0539) BP: (108-141)/(41-77) 116/59 mmHg (08/07 0539) SpO2:  [94 %-100 %] 94 % (08/07 0539) Weight:  [139 lb 12.4 oz (63.4 kg)] 139 lb 12.4 oz (63.4 kg) (08/07 0539) Last BM Date: 05/26/12 General:   Alert,  Well-developed,    in NAD Heart:  Regular rate and rhythm; no murmurs Pulm;clear Abdomen:  Soft, nontender and nondistended. Normal bowel sounds, without guarding, and without rebound.   Extremities:  Without edema. Neurologic:  Alert and  oriented x4;  grossly normal neurologically. Psych:  Alert and cooperative. Normal mood and affect.  Intake/Output from previous day: 08/06 0701 - 08/07 0700 In: 3420 [P.O.:1720; I.V.:1700] Out: 750 [Urine:750] Intake/Output this shift:    Lab Results:  Basename 05/27/12 0453 05/26/12 2120 05/26/12 0815  WBC 11.4* 10.3 12.6*  HGB 11.4* 8.4* 8.5*  HCT 34.3* 25.0* 24.1*  PLT 192 225 196   BMET  Basename 05/27/12 0453 05/26/12 0815 05/25/12 1907  NA 140 140 135  K 3.7 3.8 4.4  CL 108 105 99  CO2 26 28 27   GLUCOSE 110* 138* 286*  BUN 22 43* 52*  CREATININE 1.01 1.25* 1.23*  CALCIUM 8.3* 8.7 9.6   LFT  Basename 05/26/12 0815  PROT 5.3*  ALBUMIN 2.9*  AST 16  ALT 9  ALKPHOS 37*  BILITOT 0.4  BILIDIR --  IBILI --   PT/INR  Basename 05/25/12 1907  LABPROT 13.7  INR 1.03      Assessment / Plan: #!78 yo female with  Acute GI bleed -etiology unclear - EGD unrevealing-pt is for Colonoscopy today #2  Asymptomatic  Vtach-nonsustained- will discuss - may need to delay procedure today,increased risk with  sedation EKG ,cardiology consult  #3  Anemia-stable, no active bleeding Principal Problem:  *GI bleeding Active Problems:  HYPERTENSION, BENIGN SYSTEMIC  ASTHMA, UNSPECIFIED, UNSPECIFIED STATUS  Anemia  Acute posthemorrhagic anemia     LOS: 2 days   Amy Esterwood  05/27/2012, 8:55 AM

## 2012-05-27 NOTE — Interval H&P Note (Signed)
History and Physical Interval Note:  05/27/2012 4:01 PM  [name]  has presented today for surgery, with the diagnosis of heme positive stool  The various methods of treatment have been discussed with the patient and family. After consideration of risks, benefits and other options for treatment, the patient has consented to  Procedure(s) (LRB): COLONOSCOPY (N/A) as a surgical intervention .  The patient's history has been reviewed, patient examined, no change in status, stable for surgery.  I have reviewed the patient's chart and labs.  Questions were answered to the patient's satisfaction.     Lina Sar

## 2012-05-27 NOTE — Progress Notes (Addendum)
Patient ID: [name], female   DOB: [birth year], 78 y.o.   MRN: [id number]  [facility] PROGRESS NOTE  [name] WUJ:[id number] DOB: 30-Aug-1931 DOA: 05/25/2012 PCP: Elvina Sidle, MD  Brief narrative: Pt is 78 yo female who was admitted 05/25/2012 with progressively worsening generalized weakness, black tarry stools, and was found to be anemia with acute blood loss of unclear etiology.  Principal Problem:  *GI bleeding - now status post colonoscopy 08/07 - sessile polyp noted on study and pathology studies are sent, will await for results - CBC in AM - continue Iron supplementation  Active Problems:  HYPERTENSION, BENIGN SYSTEMIC - this remains stable and within normal range - continue to monitor vitals per floor protocol   ASTHMA, UNSPECIFIED, UNSPECIFIED STATUS - clinically stable - pt is maintaining oxygen saturations above 95% on RA   Anemia - partly secondary to acute blood loss imposed on anemia of chronic disease - will obtain CBC in AM   Acute renal failure - perhaps secondary to pre renal etiology - will obtain BMP in AM   Leukocytosis - please note that urine culture with E. Coli but pt denies any urinary symptoms at this time - WBC increased on admission but now within normal limits - pt is afebrile and is asymptomatic in terms of urinary concerns - will continue to monitor CBC and daily I's and O's - no need for ABX at this time but if pt develops symptoms we can treat   Diabetes mellitus - Hg A1C 6.9 - will discuss the diet regimen recommendations - will likely start Metformin once renal function returns to baseline - pt is not on any antihyperglycemic regimen - will continue insulin SSI, novolog and sensitive coverage  Consultants:  GI  Procedures/Studies:  Colonoscopy 05/27/2012  ENDOSCOPIC IMPRESSION:  1) Sessile polyp  2) Moderate diverticulosis in the sigmoid colon  3) Otherwise normal examination nothing to account for heme positive  anemia   RECOMMENDATIONS:  1) Await pathology results  2) Iron supplements  3) recheck CBC till back to normal,then repeat stool cards is positive then small bowl capsule endoscopy  Antibiotics:  None  Code Status: Full Family Communication: Pt at bedside Disposition Plan: Home when medically stable  HPI/Subjective: No events overnight.   Objective: Filed Vitals:   05/27/12 1700 05/27/12 1710 05/27/12 1731 05/27/12 2131  BP: 117/58 135/61 118/55 105/52  Pulse:   66 72  Temp:   98.2 F (36.8 C) 98 F (36.7 C)  TempSrc:   Oral Oral  Resp: 19 13 16 18   Height:      Weight:      SpO2: 100% 96% 94% 96%    Intake/Output Summary (Last 24 hours) at 05/27/12 2158 Last data filed at 05/27/12 1841  Gross per 24 hour  Intake   2050 ml  Output    500 ml  Net   1550 ml    Exam:   General:  Pt is alert, follows commands appropriately, not in acute distress  Cardiovascular: Regular rate and rhythm, S1/S2, no murmurs, no rubs, no gallops  Respiratory: Clear to auscultation bilaterally, no wheezing, no crackles, no rhonchi  Abdomen: Soft, non tender, non distended, bowel sounds present, no guarding  Extremities: No edema, pulses DP and PT palpable bilaterally  Neuro: Grossly nonfocal  Data Reviewed: Basic Metabolic Panel:  Lab 05/27/12 9562 05/26/12 0815 05/25/12 1907 05/25/12 1440  NA 140 140 135 136  K 3.7 3.8 4.4 4.6  CL 108 105 99 103  CO2  26 28 27 26   GLUCOSE 110* 138* 286* 401*  BUN 22 43* 52* 58*  CREATININE 1.01 1.25* 1.23* 1.32*  CALCIUM 8.3* 8.7 9.6 9.5  MG -- -- -- --  PHOS -- -- -- --   Liver Function Tests:  Lab 05/26/12 0815 05/25/12 1907 05/25/12 1440  AST 16 17 16   ALT 9 11 11   ALKPHOS 37* 40 34*  BILITOT 0.4 0.1* 0.2*  PROT 5.3* 6.2 5.9*  ALBUMIN 2.9* 3.5 3.4*   CBC:  Lab 05/27/12 2040 05/27/12 0453 05/26/12 2120 05/26/12 0815 05/25/12 1907  WBC 11.6* 11.4* 10.3 12.6* 14.6*  NEUTROABS -- -- -- 7.2 10.1*  HGB 8.2* 11.4* 8.4* 8.5*  6.5*  HCT 24.6* 34.3* 25.0* 24.1* 19.0*  MCV 93.9 72.2* 91.2 89.6 95.5  PLT 239 192 225 196 269   CBG:  Lab 05/27/12 1738 05/27/12 1207 05/27/12 0750 05/26/12 2244 05/26/12 1706  GLUCAP 95 135* 123* 136* 240*    Recent Results (from the past 240 hour(s))  URINE CULTURE     Status: Normal (Preliminary result)   Collection Time   05/25/12  3:35 PM      Component Value Range Status Comment   Colony Count >=100,000 COLONIES/ML   Preliminary    Preliminary Report ESCHERICHIA COLI   Preliminary      Scheduled Meds:   . calcium carbonate  1 tablet Oral BID WC  . fenofibrate  54 mg Oral Daily  . insulin aspart  0-9 Units Subcutaneous TID WC  . metoprolol succinate  50 mg Oral Daily  . pantoprazole IV  40 mg Intravenous Q24H  . vitamin C  1,000 mg Oral Daily    Debbora Presto, MD  [facility] Pager [phone]  If 7PM-7AM, please contact night-coverage [email or web address] Password [email or web address] 05/27/2012, 9:58 PM   LOS: 2 days

## 2012-05-27 NOTE — Progress Notes (Signed)
Pt had 5 beats of V.tach.  In pt's room at this time when notified.  Pt up OOB sitting on BSC.  Pt asymptomatic, only complaint was that she was cold.  Will continue to monitor pt.

## 2012-05-27 NOTE — Op Note (Signed)
[facility] [address] [place], [place]  [address]  COLONOSCOPY PROCEDURE REPORT  PATIENT:  Megan, Clements  MR#:  [id number] BIRTHDATE:  12-02-1930, 81 yrs. old  GENDER:  female ENDOSCOPIST:  Hedwig Morton. Juanda Chance, MD REF. BY:  [facility], PROCEDURE DATE:  05/27/2012 PROCEDURE:  Colon with cold biopsy polypectomy ASA CLASS:  Class II INDICATIONS:  Anemia, heme positive stool EGD showed es. stricture, prior colonoscopy 2009 MEDICATIONS:   These medications were titrated to patient response per physician's verbal order, Versed 5 mg, Fentanyl 50 mcg  DESCRIPTION OF PROCEDURE:   After the risks and benefits and of the procedure were explained, informed consent was obtained. Digital rectal exam was performed and revealed no rectal masses. The Pentax Ped Colon [id number] endoscope was introduced through the anus and advanced to the cecum, which was identified by both the appendix and ileocecal valve.  The quality of the prep was good, using MoviPrep.  The instrument was then slowly withdrawn as the colon was fully examined. <<PROCEDUREIMAGES>>  FINDINGS:  A sessile polyp was found. 3 mm polyp in the right colon The polyp was removed using cold biopsy forceps (see image8).  Moderate diverticulosis was found in the sigmoid colon (see image3). tortuous spastic sigmoid colon  This was otherwise a normal examination of the colon (see image9, image7, image6, image4, and image2).   Retroflexed views in the rectum revealed no abnormalities.    The scope was then withdrawn from the patient and the procedure completed.  COMPLICATIONS:  None ENDOSCOPIC IMPRESSION: 1) Sessile polyp 2) Moderate diverticulosis in the sigmoid colon 3) Otherwise normal examination nothing to account for heme positive anemia RECOMMENDATIONS: 1) Await pathology results Iron supplements recheck CBC till back to normal,then repeat stool cards is positive then small bowl capsule endoscopy  REPEAT  EXAM:  In 10 year(s) for.  ______________________________ Hedwig Morton. Juanda Chance, MD  CC:  n. eSIGNED:   Hedwig Morton. Sheronda Parran at 05/27/2012 04:42 PM  [name], [id number]

## 2012-05-28 ENCOUNTER — Encounter (HOSPITAL_COMMUNITY): Payer: Self-pay

## 2012-05-28 ENCOUNTER — Encounter (HOSPITAL_COMMUNITY): Payer: Self-pay | Admitting: Internal Medicine

## 2012-05-28 DIAGNOSIS — D649 Anemia, unspecified: Secondary | ICD-10-CM

## 2012-05-28 DIAGNOSIS — K922 Gastrointestinal hemorrhage, unspecified: Secondary | ICD-10-CM

## 2012-05-28 LAB — URINE CULTURE: Colony Count: >=100000

## 2012-05-28 LAB — CBC
HCT: 23.3 % — ABNORMAL LOW (ref 36.0–46.0)
HCT: 32.1 % — ABNORMAL LOW (ref 36.0–46.0)
Hemoglobin: 10.9 g/dL — ABNORMAL LOW (ref 12.0–15.0)
MCHC: 33.5 g/dL (ref 30.0–36.0)
MCHC: 34 g/dL (ref 30.0–36.0)
Platelets: 232 10*3/uL (ref 150–400)
RBC: 3.55 MIL/uL — ABNORMAL LOW (ref 3.87–5.11)
RDW: 16.1 % — ABNORMAL HIGH (ref 11.5–15.5)
WBC: 10.4 10*3/uL (ref 4.0–10.5)

## 2012-05-28 LAB — BASIC METABOLIC PANEL
BUN: 15 mg/dL (ref 6–23)
Chloride: 108 mEq/L (ref 96–112)
GFR calc Af Amer: 56 mL/min — ABNORMAL LOW (ref >90)
GFR calc non Af Amer: 48 mL/min — ABNORMAL LOW (ref >90)
Potassium: 3.6 mEq/L (ref 3.5–5.1)
Sodium: 139 mEq/L (ref 135–145)

## 2012-05-28 LAB — UIFE/LIGHT CHAINS/TP QN, 24-HR UR
Albumin, U: DETECTED
Free Lambda Lt Chains,Ur: 0.38 mg/dL (ref 0.02–0.67)
Gamma Globulin, Urine: DETECTED — AB

## 2012-05-28 LAB — GLUCOSE, CAPILLARY
Glucose-Capillary: 216 mg/dL — ABNORMAL HIGH (ref 70–99)
Glucose-Capillary: 140 mg/dL — ABNORMAL HIGH (ref 70–99)

## 2012-05-28 LAB — PROTEIN ELECTROPHORESIS, SERUM
Albumin ELP: 59.9 % (ref 55.8–66.1)
Alpha-1-Globulin: 5.2 % — ABNORMAL HIGH (ref 2.9–4.9)
Alpha-2-Globulin: 10.2 % (ref 7.1–11.8)
Total Protein ELP: 5.3 g/dL — ABNORMAL LOW (ref 6.0–8.3)

## 2012-05-28 LAB — SAVE SMEAR

## 2012-05-28 LAB — PREPARE RBC (CROSSMATCH)

## 2012-05-28 NOTE — Consult Note (Signed)
[name]  DOB: September 13, 1931  MR#: [id number]  CSN#: [id number]    Hematology Consultation Requesting MD: Dr. Lina Sar Consulting MD. Dr. Arline Asp PCP: [facility]  Reason for consult: Anemia  HPI: [name] is a 78 year old woman with a history of chronic renal insufficiency followed by Dr. Kathrene Bongo, as well as anemia as outpatient for which she is on iron supplements at least since 2010,  admitted on 8/5 after presenting to PCP with 4 day history of generalized  weakness and multiple falls without any other associated symptoms such as dyspnea or chest pain. She did have some dizziness but no syncope.Her Hb / Hct was 6.5 /19, WBC 14.6.Her last Outpatient labs in June 2013 showed her H/H to be 12.4 and 36.4 respectively. She denied any changes in bowel caliber, or bright red blood per rectum, although her stools were noted to be very dark. guaiac stools were positive, for which GI evaluation was obtained. Patient underwent EGD on 8/6 , non diagnostic for source of bleeding. Her prior EGD by Dr. Elnoria Howard performed on7/ 2009 showed a distal esophageal stricture and duodenitis. Colonoscopy 04/2008 showed 2 5mm polyps in descending colon which were removed and internal hemorrhoids. Pathology revealed  Adenomatous polyps, Peptic duodenitis. Colonoscopy on 8/7 by Dr. Juanda Chance, revealed a sessile 3 mm polyp in the right colon, removed and sent for pathology.Moderate diverticulosis was found in the sigmoid colon. She received 2 units of blood on 8/6 with good response reaching a level of 11.4/34.3, to drop on 8/7 to 7.8/23.3  requiring  2 more units  . Her last Ferritin on 8/5 was 501, with Fe 99,TIBC 291 and percent saturation of 34. Retic count is 4.9  Patient was on ASA prior to admission which was discontinued. She denied any significant NSAIDs intake. On 8/8. Her SPEP and UPEP with immunofixation on 8/6 are pending.  In the interim, her urine culture has returned positive for E Coli. At this time,  patient denies any areas of acute bleeding. Of note, she admits to drink high amounts of iced tea daily. In addition, she has consumed hamburgers a few days before admission, although she states that they may have been served well done Smear has been ordered for review. We were asked to see the patient for evaluation of anemia in the setting acute on chronic disease.   Past medical history:      Past Medical History  Diagnosis Date  . Anemia   . Hypertension   . GERD (gastroesophageal reflux disease)   . Diabetes mellitus   . Hyperlipidemia   . DVT (deep venous thrombosis) 03/2005  . Glaucoma   . Cataract   . Asthma     Stage III Chronic Renal Insufficiency secondary to DM   remote history of Neisseria meningitis in 2005   Past surgical history:      Past Surgical History  Procedure Date  . S/p Fem Pop Bypass Graft, Dr. Edilia Bo  09/2004  . Wrist fracture surgery 2008    R wrist fracture, plate  . Esophagogastroduodenoscopy 05/26/2012    Procedure: ESOPHAGOGASTRODUODENOSCOPY (EGD);  Surgeon: Hart Carwin, MD;  Location: [facility];  Service: Endoscopy;  Laterality: N/A;  . Colonoscopy 05/27/2012    Procedure: COLONOSCOPY;  Surgeon: Hart Carwin, MD;  Location: [facility];  Service: Endoscopy;  Laterality: N/A;    Medications:  Prior to Admission:  Prescriptions prior to admission  Medication Sig Dispense Refill  . Ascorbic Acid (VITAMIN C) 1000  MG tablet Take 1,000 mg by mouth daily.      Marland Kitchen aspirin 325 MG tablet Take 325 mg by mouth daily.      . calcium carbonate (OS-CAL) 600 MG TABS Take 600 mg by mouth 2 (two) times daily with a meal.      . cefTRIAXone (ROCEPHIN) 1 G injection Inject 1 g into the muscle once.      . fenofibrate (TRICOR) 48 MG tablet Take 48 mg by mouth daily.      . metoprolol succinate (TOPROL-XL) 50 MG 24 hr tablet Take 50 mg by mouth daily. Take with or immediately following a meal.      . Multiple Vitamin (MULTIVITAMIN) capsule Take 1 capsule by  mouth daily.        ONG:EXBMWUXL: fentaNYL, DISCONTD: midazolam  Allergies: No Known Allergies  Family history:     Family History  Problem Relation Age of Onset  . Adopted: Yes   1 son with testicular cancer                                           Social history:    She is married and has four children. She resides in  [place] with her husband.  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 Smoking status: Never Smoker   . Smokeless tobacco: Never Used  . Alcohol Use: No     PAP: 11/16/08             MGM: 03/06/09    Review of systems:   See HPI for significant positives, other than chronic R knee and hip pain, the rest of the ROS is negative.    Allergies:    No Known Allergies  Medications:      Current Facility-Administered Medications  Medication Dose Route Frequency Provider Last Rate Last Dose  . calcium carbonate (OS-CAL - dosed in mg of elemental calcium) tablet 500 mg of elemental calcium  1 tablet Oral BID WC Massie Maroon, MD   500 mg of elemental calcium at 05/28/12 0846  . fenofibrate tablet 54 mg  54 mg Oral Daily Massie Maroon, MD   54 mg at 05/28/12 1051  . insulin aspart (novoLOG) injection 0-9 Units  0-9 Units Subcutaneous TID WC Massie Maroon, MD   3 Units at 05/28/12 1211  . metoprolol succinate (TOPROL-XL) 24 hr tablet 50 mg  50 mg Oral Daily Massie Maroon, MD   50 mg at 05/28/12 1051  . pantoprazole (PROTONIX) injection 40 mg  40 mg Intravenous Q24H Amy S Esterwood, PA   40 mg at 05/28/12 1050  . sodium chloride 0.9 % injection 3 mL  3 mL Intravenous Q12H Massie Maroon, MD   3 mL at 05/28/12 1050  . vitamin C (ASCORBIC ACID) tablet 1,000 mg  1,000 mg Oral Daily Massie Maroon, MD   1,000 mg at 05/28/12 1050  . DISCONTD: 0.9 %  sodium chloride infusion   Intravenous  Continuous Hart Carwin, MD      . DISCONTD: 0.9 %  sodium chloride infusion   Intravenous Continuous Hart Carwin, MD      . DISCONTD: fentaNYL (SUBLIMAZE)  injection    PRN Hart Carwin, MD   25 mcg at 05/27/12 1620  . DISCONTD: midazolam (VERSED) injection    PRN Hart Carwin, MD   1 mg at 05/27/12 1620    Physical exam:      Filed Vitals:   05/28/12 0611  BP: 121/66  Pulse: 62  Temp: 98.6 F (37 C)  Resp: 20     Body mass index is 22.14 kg/(m^2).  ECOG PS:  51 -year-old white female in no acute distress A. and O. x3 General well-developed and well-nourished  HEENT: Normocephalic, atraumatic, PERRLA. Oral cavity without thrush or lesions. Neck supple. no thyromegaly, no cervical or supraclavicular adenopathy  Lungs clear bilaterally . No wheezing, rhonchi or rales. No axillary masses. Breasts: not examined. Cardiac regular rate and rhythm normal S1-S2, no murmur , rubs or gallops Abdomen soft nontender , bowel sounds x4. No HSM GU/rectal: deferred. Extremities no clubbing cyanosis or edema. No bruising or petechial rash    Lab results:      CBC  Lab 05/28/12 0507 05/27/12 2040 05/27/12 0453 05/26/12 2120 05/26/12 0815 05/25/12 1907  WBC 10.4 11.6* 11.4* 10.3 12.6* --  HGB 7.8* 8.2* 11.4* 8.4* 8.5* --  HCT 23.3* 24.6* 34.3* 25.0* 24.1* --  PLT 232 239 192 225 196 --  MCV 94.3 93.9 72.2* 91.2 89.6 --  MCH 31.6 31.3 24.0* 30.7 31.6 --  MCHC 33.5 33.3 33.2 33.6 35.3 --  RDW 16.1* 16.0* 19.7* 16.2* 15.3 --  LYMPHSABS -- -- -- -- 3.6 3.3  MONOABS -- -- -- -- 1.2* 1.1*  EOSABS -- -- -- -- 0.6 0.1  BASOSABS -- -- -- -- 0.1 0.0  BANDABS -- -- -- -- -- --    Anemia panel:   Basename 05/25/12 1907  VITAMINB12 903  FOLATE >20.0  FERRITIN 501*  TIBC 291  IRON 99  RETICCTPCT 4.9*     Chemistries   Lab 05/28/12 0507 05/27/12 0453 05/26/12 0815 05/25/12 1907 05/25/12 1440  NA 139 140 140 135 136  K 3.6 3.7 3.8 4.4 4.6  CL 108 108 105 99 103  CO2 26 26 28 27  26   GLUCOSE 127* 110* 138* 286* 401*  BUN 15 22 43* 52* 58*  CREATININE 1.07 1.01 1.25* 1.23* 1.32*  CALCIUM 8.7 8.3* 8.7 9.6 9.5  MG -- -- -- -- --     Coagulation profile  Lab 05/25/12 1907  INR 1.03  PROTIME --    Urine Studies No results found for this basename: UACOL:2,UAPR:2,USPG:2,UPH:2,UTP:2,UGL:2,UKET:2,UBIL:2,UHGB:2,UNIT:2,UROB:2,ULEU:2,UEPI:2,UWBC:2,URBC:2,UBAC:2,CAST:2,CRYS:2,UCOM:2,BILUA:2 in the last 72 hours  Studies:      Dg Hip Complete Left  05/25/2012  *RADIOLOGY REPORT*  Clinical Data: Chronic hip pain.  LEFT HIP - COMPLETE 2+ VIEW  Comparison: None.  Findings: SI joints appear intact.  There is minimal spurring on the left at the pubic symphysis.  Pelvic phleboliths are present. Nonaneurysmal arterial calcifications are present.  There is transitional vertebra at the lumbosacral junction with formation of assimilation joint on the left with the sacrum.  There is minimal osteophyte formation associated with degenerative spondylosis.  Hip joint spaces are relatively preserved.  Osteophyte formation is seen involving the acetabulum and the femoral head consistent with degenerative joint spurring. No fracture or bony destruction.  No calcific bursitis is seen.  Surgical staples are seen in the left inguinal region.  IMPRESSION: Nonaneurysmal arterial calcifications.  Degenerative spondylosis. Transitional vertebra with left assimilation joint.  Left hip osteophyte formation consistent with degenerative joint spurring.  Clinically significant discrepancy from primary report, if provided: None  Original Report Authenticated By: Crawford Givens, M.D.   Dg Knee 1-2 Views Left  05/25/2012  *RADIOLOGY REPORT*  Clinical Data: Knee pain of unknown origin.  LEFT KNEE - 1-2 VIEW  Comparison: None.  Findings: Nonaneurysmal arterial calcifications are seen.  Venous stasis calcifications are seen in the calf.  There is narrowing of the patellofemoral joint space.  There is slight narrowing of  the medial joint space. There is patellofemoral osteophyte formation. There is osteopenic appearance of the bones.  No fracture or bony destruction is seen.  No chondrocalcinosis or opaque loose body is definitely seen. No definite joint effusion is evident.  IMPRESSION: Nonaneurysmal arterial calcifications and phleboliths. Patellofemoral osteoarthritic changes. Slight narrowing of medial joint space.  Osteopenic appearance of bones.  Clinically significant discrepancy from primary report, if provided: None  Original Report Authenticated By: Crawford Givens, M.D.   Dg Chest Portable 1 View  05/25/2012  *RADIOLOGY REPORT*  Clinical Data: Weakness and anemia.  PORTABLE CHEST - 1 VIEW  Comparison: 07/18/2009.  Findings: The cardiac silhouette, mediastinal and hilar contours are within normal limits and stable.  Stable emphysematous changes and pulmonary scarring.  No acute pulmonary process.  The bony thorax is intact.  IMPRESSION: Chronic lung changes but no acute pulmonary findings.  Original Report Authenticated By: P. Loralie Champagne, M.D.    Assessment/Plan:: 78 y.o. female with a history of Stage 3 CKD and Chronic anemia on Iron supplements since 2010, admitted with symptomatic anemia, with a H/H of 6.5/19 requiring 3 units of blood to date, with one more unit pending. She was found to have positive heme stools, and underwent EGD and colonoscopy, the latter demonstrating a sessile polyp removed, which pathology is pending. No Epo levels are found in chart. In addition, patient's status complicated with E Coli UTI per chart report.   Dr Arline Asp is to see the patient following this consult with recommendations after reviewing the peripheral blood smear, to proceed with  further workup studies if necessary.  Thank you for allowing [place] to participate in the care of this nice patient.    [facility] 05/28/2012, 1:31 PM   This 78 year old white married female was admitted to the hospital on 05/25/2012 with a  clinical presentation which was suggestive of a GI bleed  with heme positive stools. Upper endoscopy and colonoscopy during this admission failed to reveal a definite site of bleeding. The patient had been on one aspirin a day but denied taking nonsteroidal anti-inflammatory drugs. She has no prior history of GI bleeding or blood transfusions. We were asked to see the patient in consultation by Dr. Lina Sar because of concerns that the patient might have a hematologic disorder.  It is noteworthy that on 03/23/2012, CBC was completely normal with a hemoglobin of 12.4 hematocrit 36.4. At the time of admission on 05/25/2012, hemoglobin was 6.5, hematocrit 19.0. The patient received 2 units of packed red cells on 05/26/2012 and her hemoglobin that day was 8.4. On the following day however, 05/27/2012, hemoglobin was recorded at 11.4 and hematocrit 34.3 along with red cell indices that were considerably lower than all of the other patients recorded CBCs, thereby leading me to believe that this particular CBC from 05/27/2012 at 0453 hours is spurious. Another CBC from 05/27/2012 at 2040 hours showed a hemoglobin of 8.2 and hematocrit of 24.6. Today the hemoglobin was 7.8 hematocrit 23.3 and the patient has received another 2 units of packed red cells.  Today at 2045 hours, hemoglobin is 10.9 and hematocrit 32.1. At this point there appears to be no evidence of further GI bleeding. I think that the slight drop in the hemoglobin and hematocrit over the past couple of days can be attributed to equilibration, iatrogenic phlebotomy and  IV fluids.  Inspection of the peripheral smear shows no significant abnormalities nor do I see any changes to suggest iron deficiency, myelodysplastic syndrome or any other bone marrow pathology. In addition anemia profile studies from 05/25/2012 were normal. Serum protein electrophoresis did not show a monoclonal protein.  In order to exclude the possibility of hemolysis, I ordered an  LDH, haptoglobin and Coombs' test. Assuming these tests come back negative, this patient will need careful monitoring of her hemoglobin and hematocrit as well as her stools. If there is evidence for GI bleeding, the patient may need an evaluation of her small bowel for arteriovenous malformations or other lesions. If the patient's anemia persists and/or recurs without explanation, I will certainly be happy to see her as an outpatient after discharge.  I can be reached on pager [phone] and will be happy to discuss this patient's case with you.     Samul Dada 05/28/2012 10:34 PM

## 2012-05-28 NOTE — Progress Notes (Signed)
Subjective No complaingts, slept well  Objective: Hgb down to 7.8 this am, no apparent bleeding, Iron studies are normal, suspect hematological disease Vital signs in last 24 hours: Temp:  [98 F (36.7 C)-98.6 F (37 C)] 98.6 F (37 C) (08/08 1914) Pulse Rate:  [62-72] 62  (08/08 0611) Resp:  [10-23] 20  (08/08 0611) BP: (105-153)/(52-73) 121/66 mmHg (08/08 0611) SpO2:  [94 %-100 %] 94 % (08/08 0611) Weight:  [137 lb 3.2 oz (62.234 kg)] 137 lb 3.2 oz (62.234 kg) (08/08 0500) Last BM Date: 05/27/12 General:   Alert,  pleasant, cooperative in NAD Head:  Normocephalic and atraumatic. Eyes:  Sclera clear, no icterus.   Conjunctiva pink. Mouth:  No deformity or lesions, dentition normal. Neck:  Supple; no masses or thyromegaly. Heart:  Regular rate and rhythm; no murmurs, clicks, rubs,  or gallops. Lungs: few inspiratory rales Abdomen:  Soft, non-tender, spleen not enlarged, liver normal  Msk:  Symmetrical without gross deformities. Normal posture. Pulses:  Normal pulses noted. Extremities:  Without clubbing or edema. Neurologic:  Alert and  oriented x4;  grossly normal neurologically. Skin:  Intact without significant lesions or rashes.  Intake/Output from previous day: 08/07 0701 - 08/08 0700 In: 10 [IV Piggyback:10] Out: 250 [Urine:250] Intake/Output this shift:    Lab Results:  Basename 05/28/12 0507 05/27/12 2040 05/27/12 0453  WBC 10.4 11.6* 11.4*  HGB 7.8* 8.2* 11.4*  HCT 23.3* 24.6* 34.3*  PLT 232 239 192   BMET  Basename 05/28/12 0507 05/27/12 0453 05/26/12 0815  NA 139 140 140  K 3.6 3.7 3.8  CL 108 108 105  CO2 26 26 28   GLUCOSE 127* 110* 138*  BUN 15 22 43*  CREATININE 1.07 1.01 1.25*  CALCIUM 8.7 8.3* 8.7   LFT  Basename 05/26/12 0815  PROT 5.3*  ALBUMIN 2.9*  AST 16  ALT 9  ALKPHOS 37*  BILITOT 0.4  BILIDIR --  IBILI --   PT/INR  Basename 05/25/12 1907  LABPROT 13.7  INR 1.03   Hepatitis Panel No results found for this basename:  HEPBSAG,HCVAB,HEPAIGM,HEPBIGM in the last 72 hours  Studies/Results: No results found.   ASSESSMENT:   Principal Problem:  *GI bleeding Active Problems:  HYPERTENSION, BENIGN SYSTEMIC  ASTHMA, UNSPECIFIED, UNSPECIFIED STATUS  Anemia  Acute posthemorrhagic anemia  Benign neoplasm of colon     PLAN:   GI work up negative for active blood loss, her Hgb continues to trend down despite of normal Iron studies. Suggest to obtain Hematology consult and transfuse, She would not be going home today as suspected. I have spoken to the pt and her husband.     LOS: 3 days   Lina Sar  05/28/2012, 7:17 AM

## 2012-05-28 NOTE — Progress Notes (Signed)
Patient ID: [name], female   DOB: Jul 08, 1931, 78 y.o.   MRN: [id number]  [facility] PROGRESS NOTE  [name] YNW:[id number] DOB: [birth year] DOA: 05/25/2012 PCP: Elvina Sidle, MD  Brief narrative:  Pt is 78 yo female who was admitted 05/25/2012 with progressively worsening generalized weakness, black tarry stools, and was found to be anemia with acute blood loss of unclear etiology.   Principal Problem:  *GI bleeding  - now status post colonoscopy 08/07  - sessile polyp noted on study and pathology studies are sent, will await for results  - CBC in AM  - continue Iron supplementation   Active Problems:  HYPERTENSION, BENIGN SYSTEMIC  - this remains stable and within normal range  - continue to monitor vitals per floor protocol   ASTHMA, UNSPECIFIED, UNSPECIFIED STATUS  - clinically stable  - pt is maintaining oxygen saturations above 95% on RA   Anemia  - exact etiology is still unclear - hematology oncology consult obtained - pt transfused 2 additional units of PRBC - will obtain CBC in AM   Acute renal failure  - perhaps secondary to pre renal etiology  - creatinine is within normal limits today - will obtain BMP in AM   Leukocytosis  - please note that urine culture with E. Coli but pt denies any urinary symptoms at this time  - WBC increased on admission but now within normal limits  - pt is afebrile and is asymptomatic in terms of urinary concerns  - will continue to monitor CBC and daily I's and O's  - no need for ABX at this time but if pt develops symptoms we can treat   Diabetes mellitus  - Hg A1C 6.9  - discuss the diet regimen recommendations  - will likely start Metformin once renal function returns to baseline  - pt is not on any antihyperglycemic regimen  - will continue insulin SSI, novolog and sensitive coverage   Consultants:  GI Oncology   Procedures/Studies:  Colonoscopy 05/27/2012  ENDOSCOPIC IMPRESSION:  1) Sessile polyp  2)  Moderate diverticulosis in the sigmoid colon  3) Otherwise normal examination nothing to account for heme positive anemia  RECOMMENDATIONS:  1) Await pathology results  2) Iron supplements  3) recheck CBC till back to normal,then repeat stool cards is positive then small bowl capsule endoscopy   Antibiotics:  None  Code Status: Full  Family Communication: Pt at bedside  Disposition Plan: Home when medically stable   HPI/Subjective: No events overnight.   Objective: Filed Vitals:   05/28/12 1825 05/28/12 1925 05/28/12 1930 05/28/12 2107  BP: 134/70 138/69 140/69 126/58  Pulse: 64 68 68 63  Temp: 98.7 F (37.1 C) 98.2 F (36.8 C) 98 F (36.7 C) 98.3 F (36.8 C)  TempSrc: Oral Oral Oral Oral  Resp: 18 18 18 20   Height:      Weight:      SpO2:    95%    Intake/Output Summary (Last 24 hours) at 05/28/12 2138 Last data filed at 05/28/12 1930  Gross per 24 hour  Intake   1595 ml  Output      0 ml  Net   1595 ml    Exam:   General:  Pt is alert, follows commands appropriately, not in acute distress  Cardiovascular: Regular rate and rhythm, S1/S2, no murmurs, no rubs, no gallops  Respiratory: Clear to auscultation bilaterally, no wheezing, no crackles, no rhonchi  Abdomen: Soft, non tender, non distended, bowel sounds present, no guarding  Extremities: No edema, pulses DP and PT palpable bilaterally  Neuro: Grossly nonfocal  Data Reviewed: Basic Metabolic Panel:  Lab 05/28/12 1610 05/27/12 0453 05/26/12 0815 05/25/12 1907 05/25/12 1440  NA 139 140 140 135 136  K 3.6 3.7 3.8 4.4 4.6  CL 108 108 105 99 103  CO2 26 26 28 27 26   GLUCOSE 127* 110* 138* 286* 401*  BUN 15 22 43* 52* 58*  CREATININE 1.07 1.01 1.25* 1.23* 1.32*  CALCIUM 8.7 8.3* 8.7 9.6 9.5  MG -- -- -- -- --  PHOS -- -- -- -- --   Liver Function Tests:  Lab 05/26/12 0815 05/25/12 1907 05/25/12 1440  AST 16 17 16   ALT 9 11 11   ALKPHOS 37* 40 34*  BILITOT 0.4 0.1* 0.2*  PROT 5.3* 6.2 5.9*    ALBUMIN 2.9* 3.5 3.4*   CBC:  Lab 05/28/12 2045 05/28/12 0507 05/27/12 2040 05/27/12 0453 05/26/12 2120 05/26/12 0815 05/25/12 1907  WBC 11.5* 10.4 11.6* 11.4* 10.3 -- --  NEUTROABS -- -- -- -- -- 7.2 10.1*  HGB 10.9* 7.8* 8.2* 11.4* 8.4* -- --  HCT 32.1* 23.3* 24.6* 34.3* 25.0* -- --  MCV 90.4 94.3 93.9 72.2* 91.2 -- --  PLT 206 232 239 192 225 -- --    CBG:  Lab 05/28/12 1747 05/28/12 1140 05/28/12 0805 05/27/12 2128 05/27/12 1738  GLUCAP 140* 216* 129* 193* 95    Recent Results (from the past 240 hour(s))  URINE CULTURE     Status: Normal   Collection Time   05/25/12  3:35 PM      Component Value Range Status Comment   Culture ESCHERICHIA COLI   Final    Colony Count >=100,000 COLONIES/ML   Final    Organism ID, Bacteria ESCHERICHIA COLI   Final      Scheduled Meds:   . calcium carbonate  1 tablet Oral BID WC  . fenofibrate  54 mg Oral Daily  . insulin aspart  0-9 Units Subcutaneous TID WC  . metoprolol succinate  50 mg Oral Daily  . pantoprazole (PROTONIX) IV  40 mg Intravenous Q24H  . sodium chloride  3 mL Intravenous Q12H  . vitamin C  1,000 mg Oral Daily   Continuous Infusions:    Debbora Presto, MD  [facility] Pager [phone]  If 7PM-7AM, please contact night-coverage [email or web address] Password [email or web address] 05/28/2012, 9:38 PM   LOS: 3 days

## 2012-05-29 ENCOUNTER — Encounter: Payer: Self-pay | Admitting: Internal Medicine

## 2012-05-29 DIAGNOSIS — N19 Unspecified kidney failure: Secondary | ICD-10-CM

## 2012-05-29 DIAGNOSIS — I872 Venous insufficiency (chronic) (peripheral): Secondary | ICD-10-CM

## 2012-05-29 LAB — CBC
Hemoglobin: 10.8 g/dL — ABNORMAL LOW (ref 12.0–15.0)
MCH: 30.9 pg (ref 26.0–34.0)
MCHC: 34.8 g/dL (ref 30.0–36.0)
Platelets: 210 10*3/uL (ref 150–400)
RDW: 17.1 % — ABNORMAL HIGH (ref 11.5–15.5)

## 2012-05-29 LAB — TYPE AND SCREEN
ABO/RH(D): O POS
Antibody Screen: NEGATIVE
Unit division: 0
Unit division: 0

## 2012-05-29 LAB — HAPTOGLOBIN: Haptoglobin: 97 mg/dL (ref 45–215)

## 2012-05-29 LAB — BASIC METABOLIC PANEL
CO2: 27 mEq/L (ref 19–32)
Calcium: 8.7 mg/dL (ref 8.4–10.5)
Chloride: 105 mEq/L (ref 96–112)
Creatinine, Ser: 0.98 mg/dL (ref 0.50–1.10)
Glucose, Bld: 115 mg/dL — ABNORMAL HIGH (ref 70–99)

## 2012-05-29 LAB — LACTATE DEHYDROGENASE: LDH: 168 U/L (ref 94–250)

## 2012-05-29 LAB — ERYTHROPOIETIN: Erythropoietin: 28.3 m[IU]/mL (ref 2.6–34.0)

## 2012-05-29 NOTE — Progress Notes (Signed)
Subjective No complaints, slept well  Objective: Hgb up to 10.9 after 2 units of pRBC's, Dr Murinson's assistance greatly appreciated. He is leaning toward blood loss as a reason for anemia.  Vital signs in last 24 hours: Temp:  [98 F (36.7 C)-99 F (37.2 C)] 98.8 F (37.1 C) (08/09 0526) Pulse Rate:  [59-72] 59  (08/09 0526) Resp:  [18-20] 18  (08/09 0526) BP: (111-143)/(58-70) 143/62 mmHg (08/09 0526) SpO2:  [95 %] 95 % (08/09 0526) Weight:  [140 lb 3.2 oz (63.594 kg)] 140 lb 3.2 oz (63.594 kg) (08/09 0500) Last BM Date: 05/27/12 General:   Alert,  pleasant, cooperative in NAD Head:  Normocephalic and atraumatic. Eyes:  Sclera clear, no icterus.   Conjunctiva pink. Mouth:  No deformity or lesions, dentition normal. Neck:  Supple; no masses or thyromegaly. Heart:  Regular rate and rhythm; no murmurs, clicks, rubs,  or gallops. Lungs:  No wheezes or rales Abdomen:  Soft, non tender, liver not enlarged  Msk:  Symmetrical without gross deformities. Normal posture. Pulses:  Normal pulses noted. Extremities:  Without clubbing or edema. Neurologic:  Alert and  oriented x4;  grossly normal neurologically. Skin:  Intact without significant lesions or rashes.  Intake/Output from previous day: 08/08 0701 - 08/09 0700 In: 1595 [P.O.:720; I.V.:125; Blood:750] Out: -  Intake/Output this shift:    Lab Results:  Basename 05/29/12 0539 05/28/12 2045 05/28/12 0507  WBC 11.1* 11.5* 10.4  HGB 10.8* 10.9* 7.8*  HCT 31.0* 32.1* 23.3*  PLT 210 206 232   BMET  Basename 05/29/12 0529 05/28/12 0507 05/27/12 0453  NA 137 139 140  K 4.0 3.6 3.7  CL 105 108 108  CO2 27 26 26   GLUCOSE 115* 127* 110*  BUN 15 15 22   CREATININE 0.98 1.07 1.01  CALCIUM 8.7 8.7 8.3*   LFT  Basename 05/26/12 0815  PROT 5.3*  ALBUMIN 2.9*  AST 16  ALT 9  ALKPHOS 37*  BILITOT 0.4  BILIDIR --  IBILI --   PT/INR No results found for this basename: LABPROT:2,INR:2 in the last 72 hours Hepatitis  Panel No results found for this basename: HEPBSAG,HCVAB,HEPAIGM,HEPBIGM in the last 72 hours  Studies/Results: No results found.   ASSESSMENT:   Principal Problem:  *GI bleeding Active Problems:  HYPERTENSION, BENIGN SYSTEMIC  ASTHMA, UNSPECIFIED, UNSPECIFIED STATUS  Anemia  Acute posthemorrhagic anemia  Benign neoplasm of colon     PLAN:   OK to discharge today We will schedule small bowl capsule endoscopy for next week in our office Close H/H monitoring, next CBC in our office on the day of the SBCE, then every 2 weeks till stable Continue Iron?? OV with me in 4-6 weeks- will recheck stool hemoccults at that time     LOS: 4 days   Lina Sar  05/29/2012, 7:26 AM

## 2012-05-30 NOTE — Discharge Summary (Signed)
Physician Discharge Summary  [name] BJY:[id number] DOB: 07/24/1931 DOA: 05/25/2012  PCP: Elvina Sidle, MD  Admit date: 05/25/2012 Discharge date: 05/30/2012  Recommendations for Outpatient Follow-up:  1. Pt will need to follow up with GI specialist Dr. Juanda Chance within one week post discharge 2. Pt is scheduled for capsule endoscopy in next week 3. Please obtain BMP to evaluate electrolytes and kidney function 4. Please also check CBC to evaluate Hg and Hct levels 5. Pt advised to continue taking iron supplementation  6. Please also note that A1C was 6.7 during this hospital stay and no antihyperglycemic agents started 7. Please discuss with pt need for regular A1C check to ensure that it remains within target range   Discharge Diagnoses: Acute blood loss anemia of unclear etiology Principal Problem:  *GI bleeding Active Problems:  HYPERTENSION, BENIGN SYSTEMIC  ASTHMA, UNSPECIFIED, UNSPECIFIED STATUS  Anemia  Acute posthemorrhagic anemia  Benign neoplasm of colon   Discharge Condition: Stable  Diet recommendation: Heart healthy diet discussed in details  Brief narrative:  Pt is 78 yo female who was admitted 05/25/2012 with progressively worsening generalized weakness, black tarry stools, and was found to be anemia with acute blood loss of unclear etiology.   Principal Problem:  *GI bleeding  - now status post colonoscopy 08/07  - sessile polyp noted on study and pathology studies are sent, will await for results  - CBC within next week mandated and discussed in detail with pt and husband  - continue Iron supplementation   Active Problems:  HYPERTENSION, BENIGN SYSTEMIC  - this remains stable and within normal range   ASTHMA, UNSPECIFIED, UNSPECIFIED STATUS  - clinically stable  - pt is maintaining oxygen saturations above 95% on RA   Anemia  - exact etiology is still unclear  - pt transfused total of 4 units of PRBC  - will need CBC to be checked in next  week  Acute renal failure  - perhaps secondary to pre renal etiology  - creatinine is within normal limits today  - will need BMP follow up in out patient setting   Leukocytosis  - please note that urine culture with E. Coli but pt denies any urinary symptoms at this time  - WBC increased on admission but now within normal limits  - pt is afebrile and is asymptomatic in terms of urinary concerns  - no need for ABX at this time   Diabetes mellitus  - Hg A1C 6.9  - discussed the diet regimen recommendations  - pt is not on any antihyperglycemic regimen  - this needs to be addressed with PCP in out patient setting  Consultants:  GI  Oncology   Procedures/Studies:  Colonoscopy 05/27/2012  ENDOSCOPIC IMPRESSION:  1) Sessile polyp  2) Moderate diverticulosis in the sigmoid colon  3) Otherwise normal examination nothing to account for heme positive anemia  RECOMMENDATIONS:  1) Await pathology results  2) Iron supplements  3) recheck CBC till back to normal,then repeat stool cards is positive then small bowl capsule endoscopy   Antibiotics:  None  Code Status: Full  Family Communication: Pt at bedside    Discharge Exam: Filed Vitals:   05/29/12 0526  BP: 143/62  Pulse: 59  Temp: 98.8 F (37.1 C)  Resp: 18   Filed Vitals:   05/28/12 1930 05/28/12 2107 05/29/12 0500 05/29/12 0526  BP: 140/69 126/58  143/62  Pulse: 68 63  59  Temp: 98 F (36.7 C) 98.3 F (36.8 C)  98.8 F (37.1 C)  TempSrc: Oral Oral  Oral  Resp: 18 20  18   Height:      Weight:   63.594 kg (140 lb 3.2 oz)   SpO2:  95%  95%    General: Pt is alert, follows commands appropriately, not in acute distress Cardiovascular: Regular rate and rhythm, S1/S2 +, no murmurs, no rubs, no gallops Respiratory: Clear to auscultation bilaterally, no wheezing, no crackles, no rhonchi Abdominal: Soft, non tender, non distended, bowel sounds +, no guarding Extremities: no edema, no cyanosis, pulses palpable  bilaterally DP and PT Neuro: Grossly nonfocal  Discharge Instructions  Discharge Orders    Future Appointments: Provider: Department: Dept Phone: Center:   06/26/2012 9:30 AM Hart Carwin, MD [facility] [phone] [id number]   07/02/2012 9:00 AM Elvina Sidle, MD [facility] [phone] [facility]   07/03/2012 2:30 PM Hart Carwin, MD [facility] [phone] [email or web address]     Future Orders Please Complete By Expires   Diet - low sodium heart healthy      Increase activity slowly        Medication List  As of 05/30/2012  9:24 PM   STOP taking these medications         aspirin 325 MG tablet      ROCEPHIN 1 G injection         TAKE these medications         calcium carbonate 600 MG Tabs   Commonly known as: OS-CAL   Take 600 mg by mouth 2 (two) times daily with a meal.      fenofibrate 48 MG tablet   Commonly known as: TRICOR   Take 48 mg by mouth daily.      metoprolol succinate 50 MG 24 hr tablet   Commonly known as: TOPROL-XL   Take 50 mg by mouth daily. Take with or immediately following a meal.      multivitamin capsule   Take 1 capsule by mouth daily.      vitamin C 1000 MG tablet   Take 1,000 mg by mouth daily.             Iron 325 mg BID       Follow-up Information    Follow up with Elvina Sidle, MD in 2 weeks.   Contact information:   [address] [place] [address] [phone]           The results of significant diagnostics from this hospitalization (including imaging, microbiology, ancillary and laboratory) are listed below for reference.     Microbiology: Recent Results (from the past 240 hour(s))  URINE CULTURE     Status: Normal   Collection Time   05/25/12  3:35 PM      Component Value Range Status Comment   Culture ESCHERICHIA COLI   Final    Colony Count >=100,000 COLONIES/ML   Final    Organism ID, Bacteria ESCHERICHIA COLI   Final      Labs: Basic Metabolic Panel:  Lab 05/29/12 2440  05/28/12 0507 05/27/12 0453 05/26/12 0815 05/25/12 1907  NA 137 139 140 140 135  K 4.0 3.6 3.7 3.8 4.4  CL 105 108 108 105 99  CO2 27 26 26 28 27   GLUCOSE 115* 127* 110* 138* 286*  BUN 15 15 22  43* 52*  CREATININE 0.98 1.07 1.01 1.25* 1.23*  CALCIUM 8.7 8.7 8.3* 8.7 9.6  MG -- -- -- -- --  PHOS -- -- -- -- --   Liver  Function Tests:  Lab 05/26/12 0815 05/25/12 1907 05/25/12 1440  AST 16 17 16   ALT 9 11 11   ALKPHOS 37* 40 34*  BILITOT 0.4 0.1* 0.2*  PROT 5.3* 6.2 5.9*  ALBUMIN 2.9* 3.5 3.4*   CBC:  Lab 05/29/12 0539 05/28/12 2045 05/28/12 0507 05/27/12 2040 05/27/12 0453 05/26/12 0815 05/25/12 1907  WBC 11.1* 11.5* 10.4 11.6* 11.4* -- --  NEUTROABS -- -- -- -- -- 7.2 10.1*  HGB 10.8* 10.9* 7.8* 8.2* 11.4* -- --  HCT 31.0* 32.1* 23.3* 24.6* 34.3* -- --  MCV 88.8 90.4 94.3 93.9 72.2* -- --  PLT 210 206 232 239 192 -- --   CBG:  Lab 05/29/12 0806 05/28/12 2102 05/28/12 1747 05/28/12 1140 05/28/12 0805  GLUCAP 116* 295* 140* 216* 129*     SIGNED: Time coordinating discharge: Over 30 minutes  Debbora Presto, MD  [facility] 05/30/2012, 9:24 PM Pager [phone]  If 7PM-7AM, please contact night-coverage [email or web address] Password [email or web address]

## 2012-06-01 ENCOUNTER — Other Ambulatory Visit: Payer: Self-pay | Admitting: Internal Medicine

## 2012-06-01 ENCOUNTER — Telehealth: Payer: Self-pay

## 2012-06-01 ENCOUNTER — Other Ambulatory Visit (INDEPENDENT_AMBULATORY_CARE_PROVIDER_SITE_OTHER): Payer: Medicare Other

## 2012-06-01 DIAGNOSIS — D649 Anemia, unspecified: Secondary | ICD-10-CM

## 2012-06-01 LAB — CBC WITH DIFFERENTIAL/PLATELET
Basophils Absolute: 0 10*3/uL (ref 0.0–0.1)
Eosinophils Relative: 5 % (ref 0.0–5.0)
HCT: 34.5 % — ABNORMAL LOW (ref 36.0–46.0)
Hemoglobin: 11.4 g/dL — ABNORMAL LOW (ref 12.0–15.0)
Lymphs Abs: 2 10*3/uL (ref 0.7–4.0)
MCV: 93.5 fl (ref 78.0–100.0)
Monocytes Absolute: 0.8 10*3/uL (ref 0.1–1.0)
Monocytes Relative: 7.6 % (ref 3.0–12.0)
Neutro Abs: 7.4 10*3/uL (ref 1.4–7.7)
Platelets: 259 10*3/uL (ref 150.0–400.0)
RDW: 16.2 % — ABNORMAL HIGH (ref 11.5–14.6)

## 2012-06-01 NOTE — Telephone Encounter (Signed)
Pt here for capsule endoscopy teaching. Pt given prep instructions. Pt scheduled for capsule endo 06/04/12. Per husband pt will need yogurt to swallow the capsule, he will bring it from home. Pt has been taking iron pills. Per Mike Gip PA pt to stop taking the iron until her appt. Pt and husband aware of appt date and time.

## 2012-06-02 ENCOUNTER — Other Ambulatory Visit: Payer: Self-pay | Admitting: Internal Medicine

## 2012-06-02 DIAGNOSIS — D649 Anemia, unspecified: Secondary | ICD-10-CM

## 2012-06-04 ENCOUNTER — Ambulatory Visit (INDEPENDENT_AMBULATORY_CARE_PROVIDER_SITE_OTHER): Payer: Medicare Other | Admitting: Internal Medicine

## 2012-06-04 DIAGNOSIS — R195 Other fecal abnormalities: Secondary | ICD-10-CM

## 2012-06-04 NOTE — Progress Notes (Signed)
Pt here this am with her husband. She did the prep as ordered and hasn't had anything to eat or drink since last pm. Pt able to swallow the capsule mixed with yogurt; ok'd by Mike Gip, PA and Dr Juanda Chance. Pt lay on her side for 30 minutes and no distress noticed or verbalized. Pt was discharged after stating understanding with instructions for the rest of the day. 1600, pt back stating she had no problems and sensor and computer module removed. Pt discharged with instructions.

## 2012-06-10 ENCOUNTER — Encounter: Payer: Self-pay | Admitting: *Deleted

## 2012-06-11 ENCOUNTER — Ambulatory Visit (INDEPENDENT_AMBULATORY_CARE_PROVIDER_SITE_OTHER): Payer: Medicare Other | Admitting: Family Medicine

## 2012-06-11 VITALS — BP 140/62 | HR 60 | Temp 97.9°F | Resp 16 | Ht 65.5 in | Wt 137.0 lb

## 2012-06-11 DIAGNOSIS — Z09 Encounter for follow-up examination after completed treatment for conditions other than malignant neoplasm: Secondary | ICD-10-CM

## 2012-06-11 DIAGNOSIS — E119 Type 2 diabetes mellitus without complications: Secondary | ICD-10-CM | POA: Diagnosis not present

## 2012-06-11 DIAGNOSIS — D649 Anemia, unspecified: Secondary | ICD-10-CM

## 2012-06-11 NOTE — Progress Notes (Signed)
[facility]:  Office Visit  Chief Complaint:  Chief Complaint  Patient presents with  . Follow-up    Hospital f/u    HPI: [name] is a 78 y.o. female who complains of : 1. Hospital f/u for acute GIB from 8/5-8/07/2012, being followed by Dr. Juanda Chance. Recent endoscopy and colonoscopy + for polyp, diverticulosis. Currently awaiting capsule endoscopy results from 06/04/2012. She was also seen by Heme-Onc for her acute on chronic anemia during her hospital course.  2. Hgb stable 11.4 last visit at Dr. Regino Schultze office. On 8/5 when she was sent to the ER from our office her Hb was 6.9; prior to that her Hgb was over 12.4  in 03/2012. Currently denies dizziness, SOB, CP. 4. At time of discharge from hospital medical service wanted her diabetes to be readdressed on f/u. Most recent  HbA1c was 6.9 in hospital.  She is currently controlling her diabetes with diet modification, not on any diabetes medication. Patient is not on metformin due to CKD. She was on Amryl but this was discontinued due to her dizziness and propensity for falls in June 2013 by Dr. Hal Hope.    Past Medical History  Diagnosis Date  . Anemia   . Hypertension   . GERD (gastroesophageal reflux disease)   . Diabetes mellitus   . Hyperlipidemia   . DVT (deep venous thrombosis) 03/2005  . Glaucoma   . Cataract   . Asthma   . Esophageal stricture   . Tubular adenoma of colon   . Diverticulosis   . Osteoarthritis   . PVD (peripheral vascular disease)    Past Surgical History  Procedure Date  . Vascular surgery 09/2004  . Wrist fracture surgery     R wrist fracture, plate  . Esophagogastroduodenoscopy 05/26/2012    Procedure: ESOPHAGOGASTRODUODENOSCOPY (EGD);  Surgeon: Hart Carwin, MD;  Location: [facility];  Service: Endoscopy;  Laterality: N/A;  . Colonoscopy 05/27/2012    Procedure: COLONOSCOPY;  Surgeon: Hart Carwin, MD;  Location: [facility];  Service: Endoscopy;  Laterality: N/A;   History     Social History  . Marital Status: Married    Spouse Name: N/A    Number of Children: N/A  . Years of Education: N/A   Social History Main Topics  . Smoking status: Never Smoker   . Smokeless tobacco: Never Used  . Alcohol Use: No  . Drug Use: No  . Sexually Active: None   Other Topics Concern  . None   Social History Narrative   Born: [place], [place]   Family History  Problem Relation Age of Onset  . Adopted: Yes   No Known Allergies Prior to Admission medications   Medication Sig Start Date End Date Taking? Authorizing Provider  Ascorbic Acid (VITAMIN C) 1000 MG tablet Take 1,000 mg by mouth daily.   Yes Historical Provider, MD  calcium carbonate (OS-CAL) 600 MG TABS Take 600 mg by mouth 2 (two) times daily with a meal.   Yes Historical Provider, MD  fenofibrate (TRICOR) 48 MG tablet Take 48 mg by mouth daily.   Yes Historical Provider, MD  ferrous fumarate (HEMOCYTE - 106 MG FE) 325 (106 FE) MG TABS Take 1 tablet by mouth.   Yes Historical Provider, MD  metoprolol succinate (TOPROL-XL) 50 MG 24 hr tablet Take 50 mg by mouth daily. Take with or immediately following a meal.   Yes Historical Provider, MD  Multiple Vitamin (MULTIVITAMIN) capsule Take 1 capsule by mouth daily.  Yes Historical Provider, MD     ROS: The patient denies fevers, chills, night sweats, unintentional weight loss, chest pain, palpitations, wheezing, dyspnea on exertion, nausea, vomiting, abdominal pain, dysuria, hematuria, melena, numbness, weakness, or tingling.   All other systems have been reviewed and were otherwise negative with the exception of those mentioned in the HPI and as above.    PHYSICAL EXAM: Filed Vitals:   06/11/12 1422  BP: 140/62  Pulse: 60  Temp: 97.9 F (36.6 C)  Resp: 16   Filed Vitals:   06/11/12 1422  Height: 5' 5.5" (1.664 m)  Weight: 137 lb (62.143 kg)   Body mass index is 22.45 kg/(m^2).  General: Alert, no acute distress HEENT:  Normocephalic, atraumatic,  oropharynx patent.  Cardiovascular:  Regular rate and rhythm, no rubs murmurs or gallops.  No Carotid bruits, radial pulse intact. No pedal edema.  Respiratory: Clear to auscultation bilaterally.  No wheezes, rales, or rhonchi.  No cyanosis, no use of accessory musculature GI: No organomegaly, abdomen is soft and non-tender, positive bowel sounds.  No masses. Skin: No rashes. Neurologic: Facial musculature symmetric. + Dementia Psychiatric: Patient is appropriate throughout our interaction. Lymphatic: No cervical lymphadenopathy Musculoskeletal: Gait intact.   LABS: Results for orders placed in visit on 06/01/12  CBC WITH DIFFERENTIAL      Component Value Range   WBC 10.8 (*) 4.5 - 10.5 K/uL   RBC 3.69 (*) 3.87 - 5.11 Mil/uL   Hemoglobin 11.4 (*) 12.0 - 15.0 g/dL   HCT 40.9 (*) 81.1 - 91.4 %   MCV 93.5  78.0 - 100.0 fl   MCHC 33.1  30.0 - 36.0 g/dL   RDW 78.2 (*) 95.6 - 21.3 %   Platelets 259.0  150.0 - 400.0 K/uL   Neutrophils Relative 68.9  43.0 - 77.0 %   Lymphocytes Relative 18.1  12.0 - 46.0 %   Monocytes Relative 7.6  3.0 - 12.0 %   Eosinophils Relative 5.0  0.0 - 5.0 %   Basophils Relative 0.4  0.0 - 3.0 %   Neutro Abs 7.4  1.4 - 7.7 K/uL   Lymphs Abs 2.0  0.7 - 4.0 K/uL   Monocytes Absolute 0.8  0.1 - 1.0 K/uL   Eosinophils Absolute 0.5  0.0 - 0.7 K/uL   Basophils Absolute 0.0  0.0 - 0.1 K/uL     EKG/XRAY:   Primary read interpreted by Dr[facility] Conley Rolls at [facility].   ASSESSMENT/PLAN: Encounter Diagnoses  Name Primary?  Marland Kitchen Hospital discharge follow-up Yes  . Diabetes mellitus   . Anemia    1. Patient is doing well after hospitalization from 8/5-8/07/2012 for acute GIB, acute on chronic anemia 2. No overt bleeding after hospitalization, she was given a total of 4 units according to husband who is at this visit; endoscopy and colonoscopy non-diagnostic, awaiting capsule study. Iron studies normal.  3. Seeing Dr. Juanda Chance for GIB and was also evaluated by Heme-Onc during  hospitalization 4. Hba1c was 6.9 during hospital course , not on meds., diet control only.  I will hold off on starting any meds, she has been off of it since June when Dr. Hal Hope dc her Amaryl due to h/o hypoglycemia with dizziness on the medication. She cannot be on meformin due to CKD.  5. Will f/u with Dr. Dan Europe on Sept 12. Get repeat labs at that time prn. Advise patient and husband that as long as her HbA1c is <7 then I am ok with her just controlling her DM with  diet modification. However, we need to look at other alternatives if her DM is not well controlled.     Sorren Vallier PHUONG, DO 06/12/2012 1:09 PM

## 2012-06-15 ENCOUNTER — Other Ambulatory Visit (INDEPENDENT_AMBULATORY_CARE_PROVIDER_SITE_OTHER): Payer: Medicare Other

## 2012-06-15 ENCOUNTER — Ambulatory Visit (AMBULATORY_SURGERY_CENTER): Payer: Medicare Other | Admitting: *Deleted

## 2012-06-15 ENCOUNTER — Telehealth: Payer: Self-pay | Admitting: *Deleted

## 2012-06-15 VITALS — Ht 66.0 in | Wt 136.3 lb

## 2012-06-15 DIAGNOSIS — R1310 Dysphagia, unspecified: Secondary | ICD-10-CM

## 2012-06-15 DIAGNOSIS — D649 Anemia, unspecified: Secondary | ICD-10-CM

## 2012-06-15 LAB — CBC WITH DIFFERENTIAL/PLATELET
Basophils Absolute: 0 10*3/uL (ref 0.0–0.1)
Eosinophils Absolute: 0.5 10*3/uL (ref 0.0–0.7)
MCHC: 32.6 g/dL (ref 30.0–36.0)
MCV: 94.1 fl (ref 78.0–100.0)
Monocytes Absolute: 0.7 10*3/uL (ref 0.1–1.0)
Neutrophils Relative %: 57.8 % (ref 43.0–77.0)
Platelets: 311 10*3/uL (ref 150.0–400.0)
RDW: 15.6 % — ABNORMAL HIGH (ref 11.5–14.6)
WBC: 9.6 10*3/uL (ref 4.5–10.5)

## 2012-06-15 NOTE — Telephone Encounter (Signed)
Message copied by Daphine Deutscher on Mon Jun 15, 2012  1:19 PM ------      Message from: [place], [place] R      Created: Tue Jun 02, 2012  9:36 AM      Regarding: cbc       Needs CBC in 2 weeks

## 2012-06-15 NOTE — Telephone Encounter (Signed)
Left a message for patient to call me. 

## 2012-06-18 DIAGNOSIS — I129 Hypertensive chronic kidney disease with stage 1 through stage 4 chronic kidney disease, or unspecified chronic kidney disease: Secondary | ICD-10-CM | POA: Diagnosis not present

## 2012-06-18 DIAGNOSIS — N182 Chronic kidney disease, stage 2 (mild): Secondary | ICD-10-CM | POA: Diagnosis not present

## 2012-06-18 DIAGNOSIS — D649 Anemia, unspecified: Secondary | ICD-10-CM | POA: Diagnosis not present

## 2012-06-19 ENCOUNTER — Telehealth: Payer: Self-pay | Admitting: *Deleted

## 2012-06-19 DIAGNOSIS — D649 Anemia, unspecified: Secondary | ICD-10-CM

## 2012-06-19 NOTE — Telephone Encounter (Signed)
Message copied by Richardson Chiquito on Fri Jun 19, 2012  9:05 AM ------      Message from: Hart Carwin      Created: Fri Jun 19, 2012  2:08 AM       Please call pt with excellent Hgb up to 12.6. She is set up for repeat in 2 weeks. Continue oral iron

## 2012-06-19 NOTE — Telephone Encounter (Signed)
Advised patient that her hemoglobin is up and that we will repeat labs in 2 weeks. She verbalizes understanding and will come on 07/03/12. She will also continue oral iron.

## 2012-06-24 ENCOUNTER — Encounter: Payer: Self-pay | Admitting: Internal Medicine

## 2012-06-26 ENCOUNTER — Ambulatory Visit (AMBULATORY_SURGERY_CENTER): Payer: Medicare Other | Admitting: Internal Medicine

## 2012-06-26 ENCOUNTER — Encounter: Payer: Self-pay | Admitting: Internal Medicine

## 2012-06-26 VITALS — BP 170/97 | HR 72 | Temp 96.4°F | Resp 12 | Ht 66.0 in | Wt 136.0 lb

## 2012-06-26 DIAGNOSIS — K222 Esophageal obstruction: Secondary | ICD-10-CM

## 2012-06-26 DIAGNOSIS — R1310 Dysphagia, unspecified: Secondary | ICD-10-CM

## 2012-06-26 DIAGNOSIS — J45909 Unspecified asthma, uncomplicated: Secondary | ICD-10-CM | POA: Diagnosis not present

## 2012-06-26 DIAGNOSIS — K922 Gastrointestinal hemorrhage, unspecified: Secondary | ICD-10-CM

## 2012-06-26 DIAGNOSIS — D649 Anemia, unspecified: Secondary | ICD-10-CM

## 2012-06-26 DIAGNOSIS — E119 Type 2 diabetes mellitus without complications: Secondary | ICD-10-CM | POA: Diagnosis not present

## 2012-06-26 MED ORDER — FAMOTIDINE 20 MG PO TABS: 40.0000 mg | ORAL_TABLET | Freq: Every day | ORAL | Status: DC

## 2012-06-26 MED ORDER — SODIUM CHLORIDE 0.9 % IV SOLN: 500.0000 mL | INTRAVENOUS | Status: DC

## 2012-06-26 NOTE — Op Note (Signed)
[facility] [address] [place], [address]   ENDOSCOPY PROCEDURE REPORT  PATIENT: Megan, Clements  MR#: [id number] BIRTHDATE: 06/21/1931 , 81  yrs. old GENDER: Female ENDOSCOPIST: Hart Carwin, MD REFERRED BY:  Elvina Sidle, M.D. PROCEDURE DATE:  06/26/2012 PROCEDURE:  Savary dilation of esophagus ASA CLASS:     Class III INDICATIONS:  acute post hemorrhagic anemia.   occult blood positive.   dysphagia.   recent hospital for Hgb 5.6, now up to 12.6, EGD 06/02/2012 - stricture, colon - polyp, SBCE avm. MEDICATIONS: MAC sedation, administered by CRNA and Propofol (Diprivan) 140 mg IV TOPICAL ANESTHETIC: Cetacaine Spray  DESCRIPTION OF PROCEDURE: After the risks benefits and alternatives of the procedure were thoroughly explained, informed consent was obtained.  The LB GIF-H180 [id number] endoscope was introduced through the mouth and advanced to the second portion of the duodenum. Without limitations.  The instrument was slowly withdrawn as the mucosa was fully examined.      ESOPHAGUS: mild resistance when scope traversed, small amount of bleeding Stricture was found at the gastroesophageal junction.  The stenosis was traversable with the endoscope.there was small amount of bleeding from the stricture  after passage of the scope. Savary dilatore 14,15 and 16 mm passed without duifficulty, pinkish material on the last dilator  Retroflexed views revealed no abnormalities.     The scope was then withdrawn from the patient and the procedure completed.  COMPLICATIONS: There were no complications. ENDOSCOPIC IMPRESSION: Stricture was found at the gastroesophageal junction , it was dilated with 14,15,16 mm Savary dilator, stricture appears benign, there is a 3 cm hiatal hernia distal to the stricture  RECOMMENDATIONS: 1.  anti-reflux regimen to be follow 2.  continue PPI 3. monitor CBC , next recheck 4 weeks  REPEAT EXAM: for EGD with dilatation.  Marland Kitchen  as  needed  eSigned:  Hart Carwin, MD 06/26/2012 10:27 AM   CC:  PATIENT NAME:  Megan, Clements MR#: [id number]

## 2012-06-26 NOTE — Progress Notes (Signed)
Patient did not experience any of the following events: a burn prior to discharge; a fall within the facility; wrong site/side/patient/procedure/implant event; or a hospital transfer or hospital admission upon discharge from the facility. (G8907) Patient did not have preoperative order for IV antibiotic SSI prophylaxis. (G8918)  

## 2012-06-26 NOTE — Patient Instructions (Addendum)

## 2012-06-29 ENCOUNTER — Telehealth: Payer: Self-pay | Admitting: *Deleted

## 2012-06-29 NOTE — Telephone Encounter (Signed)
  Follow up Call-  Call back number 06/26/2012  Post procedure Call Back phone  # [phone] husbands cell # (you may speak with him)    Permission to leave phone message Yes     Patient questions:  Do you have a fever, pain , or abdominal swelling? no Pain Score  0 *  Have you tolerated food without any problems? yes  Have you been able to return to your normal activities? yes  Do you have any questions about your discharge instructions: Diet   no Medications  no Follow up visit  no  Do you have questions or concerns about your Care? no  Actions: * If pain score is 4 or above: No action needed, pain <4.  Spoke with husband.

## 2012-07-02 ENCOUNTER — Encounter: Payer: Self-pay | Admitting: Family Medicine

## 2012-07-02 ENCOUNTER — Ambulatory Visit (INDEPENDENT_AMBULATORY_CARE_PROVIDER_SITE_OTHER): Payer: Medicare Other | Admitting: Family Medicine

## 2012-07-02 VITALS — BP 139/60 | HR 67 | Temp 97.1°F | Resp 16 | Ht 66.0 in | Wt 135.0 lb

## 2012-07-02 DIAGNOSIS — R269 Unspecified abnormalities of gait and mobility: Secondary | ICD-10-CM

## 2012-07-02 DIAGNOSIS — R2681 Unsteadiness on feet: Secondary | ICD-10-CM

## 2012-07-02 DIAGNOSIS — I10 Essential (primary) hypertension: Secondary | ICD-10-CM

## 2012-07-02 DIAGNOSIS — Z23 Encounter for immunization: Secondary | ICD-10-CM

## 2012-07-02 DIAGNOSIS — J45909 Unspecified asthma, uncomplicated: Secondary | ICD-10-CM | POA: Diagnosis not present

## 2012-07-02 LAB — VITAMIN B12: Vitamin B-12: 984 pg/mL — ABNORMAL HIGH (ref 211–911)

## 2012-07-02 MED ORDER — INFLUENZA VIRUS VACC SPLIT PF IM SUSP: 0.5000 mL | INTRAMUSCULAR | Status: DC

## 2012-07-02 MED ORDER — METOPROLOL SUCCINATE ER 50 MG PO TB24: 50.0000 mg | ORAL_TABLET | Freq: Every day | ORAL | Status: DC

## 2012-07-02 MED ORDER — BUDESONIDE 180 MCG/ACT IN AEPB: 1.0000 | INHALATION_SPRAY | Freq: Two times a day (BID) | RESPIRATORY_TRACT | Status: DC

## 2012-07-02 NOTE — Progress Notes (Signed)
78 yo woman here for first visit with me.  She was originally seen for pneumonia 5 years ago complicated by pressure sore and GI bleed.  She is continued to be monitored for anemia by Dr. Lina Sar.  She also suffers from asthma.  Has cataract surgery upcoming which should correct her glaucoma.  Surgery will be done in [place]. Last visit to nephrologist she was released from followup.  Has fallen twice in past year with two fractures:  Right wrist, right knee Up to date on pneumonia shot.  Objective:  NAD, wide based unsteady gait  Seen with husband.  Heart: distant heart sounds, regular without murmur Chest:  Diffuse wheezes and inspiratory rales Abdomen:  Soft, nontender without HSM Skin:  No ecchymosis  Assessment:  Uncontrolled asthma.  Would do better with daily pulmicort which she agrees to take.  The metoprolol may need to be changed if wheezing persists at next visit Unstable gain of uncertain etiolgy with falls.  Plan: flu shot We'll check her B12 and possibly refer for the gait disturbance to evaluate for NPH. Other medications refilled: 1. Asthma  influenza  inactive virus vaccine (FLUZONE/FLUARIX) injection 0.5 mL, budesonide (PULMICORT) 180 MCG/ACT inhaler  2. Unstable gait  Vitamin B12  3. Hypertension  metoprolol succinate (TOPROL-XL) 50 MG 24 hr tablet  4. Need for influenza vaccination     Follow up in 2 months.  I spent 45 minutes face to face with patient and husband, reviewing her history and examining her.

## 2012-07-03 ENCOUNTER — Other Ambulatory Visit: Payer: Self-pay | Admitting: *Deleted

## 2012-07-03 ENCOUNTER — Other Ambulatory Visit (INDEPENDENT_AMBULATORY_CARE_PROVIDER_SITE_OTHER): Payer: Medicare Other

## 2012-07-03 ENCOUNTER — Ambulatory Visit: Payer: BLUE CROSS/BLUE SHIELD | Admitting: Internal Medicine

## 2012-07-03 DIAGNOSIS — D649 Anemia, unspecified: Secondary | ICD-10-CM | POA: Diagnosis not present

## 2012-07-03 LAB — CBC WITH DIFFERENTIAL/PLATELET
Basophils Absolute: 0.1 10*3/uL (ref 0.0–0.1)
Eosinophils Absolute: 0.4 10*3/uL (ref 0.0–0.7)
HCT: 39.4 % (ref 36.0–46.0)
Lymphs Abs: 2.3 10*3/uL (ref 0.7–4.0)
Monocytes Absolute: 0.8 10*3/uL (ref 0.1–1.0)
Monocytes Relative: 8.6 % (ref 3.0–12.0)
Platelets: 262 10*3/uL (ref 150.0–400.0)
RDW: 15.7 % — ABNORMAL HIGH (ref 11.5–14.6)

## 2012-07-06 ENCOUNTER — Other Ambulatory Visit: Payer: Self-pay | Admitting: *Deleted

## 2012-07-06 ENCOUNTER — Telehealth: Payer: Self-pay | Admitting: Internal Medicine

## 2012-07-06 DIAGNOSIS — D649 Anemia, unspecified: Secondary | ICD-10-CM

## 2012-07-06 NOTE — Telephone Encounter (Signed)
Patient given results. See results notes. 

## 2012-07-14 ENCOUNTER — Other Ambulatory Visit (INDEPENDENT_AMBULATORY_CARE_PROVIDER_SITE_OTHER): Payer: Medicare Other

## 2012-07-14 ENCOUNTER — Ambulatory Visit (INDEPENDENT_AMBULATORY_CARE_PROVIDER_SITE_OTHER): Payer: Medicare Other | Admitting: Internal Medicine

## 2012-07-14 ENCOUNTER — Encounter: Payer: Self-pay | Admitting: Internal Medicine

## 2012-07-14 VITALS — BP 124/66 | HR 60 | Ht 66.0 in | Wt 135.4 lb

## 2012-07-14 DIAGNOSIS — D649 Anemia, unspecified: Secondary | ICD-10-CM

## 2012-07-14 DIAGNOSIS — K5521 Angiodysplasia of colon with hemorrhage: Secondary | ICD-10-CM | POA: Diagnosis not present

## 2012-07-14 LAB — CBC WITH DIFFERENTIAL/PLATELET
Eosinophils Relative: 4.9 % (ref 0.0–5.0)
HCT: 40.5 % (ref 36.0–46.0)
Lymphocytes Relative: 29.4 % (ref 12.0–46.0)
Monocytes Relative: 8.4 % (ref 3.0–12.0)
Neutrophils Relative %: 56.8 % (ref 43.0–77.0)
Platelets: 258 10*3/uL (ref 150.0–400.0)
WBC: 8.8 10*3/uL (ref 4.5–10.5)

## 2012-07-14 MED ORDER — FAMOTIDINE 20 MG PO TABS: 20.0000 mg | ORAL_TABLET | Freq: Every day | ORAL | Status: DC

## 2012-07-14 NOTE — Progress Notes (Signed)
[name] Sep 03, 1931 MRN [id number]    History of Present Illness:  This is a 78 year old white female with a major upper GI bleed in August 2013 presenting with a hemoglobin of 5.6. An extensive GI workup showed duodenitis and an AV malformation. She underwent an upper endoscopy and dilatation of an esophageal stricture in September 2013. She was found to have a 3 cm hiatal hernia and was dilated to 16 mm. She has no complaints today. Her last colonoscopy in August 2013 showed a small polyp. Her last hemoglobin on September 16 was 12.8 and her hematocrit was 39.4. She would like to resume her aspirin 81 mg daily. She continues on iron once a day. She also takes Pepcid 20 mg at bedtime.  Past Medical History  Diagnosis Date  . Anemia   . Hypertension   . GERD (gastroesophageal reflux disease)   . Diabetes mellitus   . Hyperlipidemia   . DVT (deep venous thrombosis) 03/2005  . Glaucoma   . Cataract   . Asthma   . Esophageal stricture   . Tubular adenoma of colon   . Diverticulosis   . Osteoarthritis   . PVD (peripheral vascular disease)   . Blood transfusion 05-24-12  . Chronic kidney disease     dr Lacy Duverney   Past Surgical History  Procedure Date  . Vascular surgery 09/2004  . Wrist fracture surgery     R wrist fracture, plate  . Esophagogastroduodenoscopy 05/26/2012    Procedure: ESOPHAGOGASTRODUODENOSCOPY (EGD);  Surgeon: Hart Carwin, MD;  Location: [facility];  Service: Endoscopy;  Laterality: N/A;  . Colonoscopy 05/27/2012    Procedure: COLONOSCOPY;  Surgeon: Hart Carwin, MD;  Location: [facility];  Service: Endoscopy;  Laterality: N/A;    reports that she has never smoked. She has never used smokeless tobacco. She reports that she does not drink alcohol or use illicit drugs. family history is negative for Colon cancer.  She is adopted. No Known Allergies      Review of Systems:  The remainder of the 10 point ROS is negative except as outlined in  H&P    Assessment and Plan:  Problem #30 78 year old white female with low-grade GI blood loss due to avm requiring hospitalization and a blood transfusion. She is now doing well on iron supplements. We will recheck her blood count today and based on her results, will continue or stop taking iron. She may now resume her aspirin 81 mg daily. She is to continue on Pepcid 20 mg at bedtime. She will have her next blood count in 8 weeks in our office and then another blood count with Dr Milus Glazier in January 2014. I will see her in 6 months.  07/14/2012 [name]

## 2012-07-14 NOTE — Patient Instructions (Addendum)
We have sent the following medications to your pharmacy for you to pick up at your convenience: Pepcid Your physician has requested that you go to the basement for the following lab work before leaving today:CBC Your physician has requested that you go to the basement for the following lab work on September 01, 2012:CBC Follow up with Dr Juanda Chance in 6 months. CC: Dr. Milus Glazier

## 2012-07-15 ENCOUNTER — Other Ambulatory Visit: Payer: Self-pay | Admitting: *Deleted

## 2012-07-15 DIAGNOSIS — D649 Anemia, unspecified: Secondary | ICD-10-CM

## 2012-07-27 DIAGNOSIS — H2589 Other age-related cataract: Secondary | ICD-10-CM | POA: Diagnosis not present

## 2012-07-27 DIAGNOSIS — E119 Type 2 diabetes mellitus without complications: Secondary | ICD-10-CM | POA: Diagnosis not present

## 2012-07-27 DIAGNOSIS — H269 Unspecified cataract: Secondary | ICD-10-CM | POA: Diagnosis not present

## 2012-07-27 DIAGNOSIS — J45909 Unspecified asthma, uncomplicated: Secondary | ICD-10-CM | POA: Diagnosis not present

## 2012-08-13 DIAGNOSIS — J45909 Unspecified asthma, uncomplicated: Secondary | ICD-10-CM | POA: Diagnosis not present

## 2012-08-13 DIAGNOSIS — H409 Unspecified glaucoma: Secondary | ICD-10-CM | POA: Diagnosis not present

## 2012-08-13 DIAGNOSIS — E119 Type 2 diabetes mellitus without complications: Secondary | ICD-10-CM | POA: Diagnosis not present

## 2012-08-13 DIAGNOSIS — H52209 Unspecified astigmatism, unspecified eye: Secondary | ICD-10-CM | POA: Diagnosis not present

## 2012-08-13 DIAGNOSIS — H2589 Other age-related cataract: Secondary | ICD-10-CM | POA: Diagnosis not present

## 2012-08-13 DIAGNOSIS — N189 Chronic kidney disease, unspecified: Secondary | ICD-10-CM | POA: Diagnosis not present

## 2012-08-13 DIAGNOSIS — H2510 Age-related nuclear cataract, unspecified eye: Secondary | ICD-10-CM | POA: Diagnosis not present

## 2012-08-13 DIAGNOSIS — H4011X0 Primary open-angle glaucoma, stage unspecified: Secondary | ICD-10-CM | POA: Diagnosis not present

## 2012-08-27 ENCOUNTER — Telehealth: Payer: Self-pay | Admitting: *Deleted

## 2012-08-27 NOTE — Telephone Encounter (Signed)
Patient notified of labs due next week. 

## 2012-08-27 NOTE — Telephone Encounter (Signed)
Message copied by Daphine Deutscher on Thu Aug 27, 2012 11:13 AM ------      Message from: Daphine Deutscher      Created: Fri Jul 03, 2012  4:49 PM       Call and remind CBC due on 08/31/12 for DB

## 2012-08-31 DIAGNOSIS — H2510 Age-related nuclear cataract, unspecified eye: Secondary | ICD-10-CM | POA: Diagnosis not present

## 2012-08-31 DIAGNOSIS — H4011X0 Primary open-angle glaucoma, stage unspecified: Secondary | ICD-10-CM | POA: Diagnosis not present

## 2012-09-03 ENCOUNTER — Telehealth: Payer: Self-pay | Admitting: *Deleted

## 2012-09-03 NOTE — Telephone Encounter (Signed)
Message copied by Daphine Deutscher on Thu Sep 03, 2012 10:26 AM ------      Message from: Daphine Deutscher      Created: Mon Jul 06, 2012  9:55 AM       Call and remind CBC due for DB on 09/07/12

## 2012-09-03 NOTE — Telephone Encounter (Signed)
Left a message for patient to call me. 

## 2012-09-04 ENCOUNTER — Other Ambulatory Visit (INDEPENDENT_AMBULATORY_CARE_PROVIDER_SITE_OTHER): Payer: Medicare Other

## 2012-09-04 DIAGNOSIS — D649 Anemia, unspecified: Secondary | ICD-10-CM

## 2012-09-04 LAB — CBC WITH DIFFERENTIAL/PLATELET
Basophils Relative: 0.4 % (ref 0.0–3.0)
Eosinophils Relative: 5.8 % — ABNORMAL HIGH (ref 0.0–5.0)
HCT: 36.8 % (ref 36.0–46.0)
Hemoglobin: 12.4 g/dL (ref 12.0–15.0)
Lymphs Abs: 2.6 10*3/uL (ref 0.7–4.0)
MCV: 94 fl (ref 78.0–100.0)
Monocytes Absolute: 0.5 10*3/uL (ref 0.1–1.0)
RBC: 3.91 Mil/uL (ref 3.87–5.11)
WBC: 7.5 10*3/uL (ref 4.5–10.5)

## 2012-09-04 NOTE — Telephone Encounter (Signed)
Patient left a message that she is coming for labs today.,

## 2012-09-07 ENCOUNTER — Telehealth: Payer: Self-pay | Admitting: Internal Medicine

## 2012-09-07 ENCOUNTER — Other Ambulatory Visit: Payer: Self-pay | Admitting: *Deleted

## 2012-09-07 DIAGNOSIS — D509 Iron deficiency anemia, unspecified: Secondary | ICD-10-CM

## 2012-09-07 DIAGNOSIS — E119 Type 2 diabetes mellitus without complications: Secondary | ICD-10-CM

## 2012-09-07 NOTE — Telephone Encounter (Signed)
Spoke with patient. See results note.  

## 2012-09-08 ENCOUNTER — Encounter: Payer: Self-pay | Admitting: Vascular Surgery

## 2012-10-05 DIAGNOSIS — H4011X0 Primary open-angle glaucoma, stage unspecified: Secondary | ICD-10-CM | POA: Diagnosis not present

## 2012-10-05 DIAGNOSIS — H2510 Age-related nuclear cataract, unspecified eye: Secondary | ICD-10-CM | POA: Diagnosis not present

## 2012-11-05 ENCOUNTER — Encounter: Payer: Self-pay | Admitting: Family Medicine

## 2012-11-05 ENCOUNTER — Ambulatory Visit (INDEPENDENT_AMBULATORY_CARE_PROVIDER_SITE_OTHER): Payer: Medicare Other | Admitting: Family Medicine

## 2012-11-05 VITALS — BP 112/58 | HR 61 | Temp 98.0°F | Resp 16 | Ht 65.0 in | Wt 139.4 lb

## 2012-11-05 DIAGNOSIS — D649 Anemia, unspecified: Secondary | ICD-10-CM

## 2012-11-05 DIAGNOSIS — D509 Iron deficiency anemia, unspecified: Secondary | ICD-10-CM

## 2012-11-05 DIAGNOSIS — E538 Deficiency of other specified B group vitamins: Secondary | ICD-10-CM

## 2012-11-05 DIAGNOSIS — K922 Gastrointestinal hemorrhage, unspecified: Secondary | ICD-10-CM

## 2012-11-05 DIAGNOSIS — E119 Type 2 diabetes mellitus without complications: Secondary | ICD-10-CM | POA: Diagnosis not present

## 2012-11-05 DIAGNOSIS — G629 Polyneuropathy, unspecified: Secondary | ICD-10-CM

## 2012-11-05 LAB — CBC
HCT: 37.3 % (ref 36.0–46.0)
Hemoglobin: 12.6 g/dL (ref 12.0–15.0)
MCH: 31.6 pg (ref 26.0–34.0)
MCHC: 33.8 g/dL (ref 30.0–36.0)
MCV: 93.5 fL (ref 78.0–100.0)
Platelets: 293 10*3/uL (ref 150–400)
RBC: 3.99 MIL/uL (ref 3.87–5.11)
RDW: 13.4 % (ref 11.5–15.5)
WBC: 8 10*3/uL (ref 4.0–10.5)

## 2012-11-05 LAB — COMPREHENSIVE METABOLIC PANEL
ALT: 13 U/L (ref 0–35)
AST: 19 U/L (ref 0–37)
Albumin: 3.6 g/dL (ref 3.5–5.2)
Alkaline Phosphatase: 44 U/L (ref 39–117)
BUN: 31 mg/dL — ABNORMAL HIGH (ref 6–23)
CO2: 29 mEq/L (ref 19–32)
Calcium: 10 mg/dL (ref 8.4–10.5)
Chloride: 104 mEq/L (ref 96–112)
Creat: 1 mg/dL (ref 0.50–1.10)
Glucose, Bld: 152 mg/dL — ABNORMAL HIGH (ref 70–99)
Potassium: 4.4 mEq/L (ref 3.5–5.3)
Sodium: 141 mEq/L (ref 135–145)
Total Bilirubin: 0.5 mg/dL (ref 0.3–1.2)
Total Protein: 6.3 g/dL (ref 6.0–8.3)

## 2012-11-05 LAB — VITAMIN B12: Vitamin B-12: 903 pg/mL (ref 211–911)

## 2012-11-05 LAB — POCT GLYCOSYLATED HEMOGLOBIN (HGB A1C): Hemoglobin A1C: 6.9

## 2012-11-05 NOTE — Progress Notes (Signed)
79 yo woman brought in by husband.  No new problems.  Last seen with GI bleed last September.  No further bleeding, abdominal pain, dizziness or weakness. Patient got a new tricycle for a Christmas present  Objective:  NAD Heart:  Reg, no murmur Chest:  Clear Ext:  No edema Skin: good color.  Assessment:  Stable.  No evidence of further GI bleed  Plan:  Check CBC  H/O type 2 diabetes S:  Watching her diet (husband says she does eat plenty of desserts0 Last eye exam:  Dr. Sanjuana Mae, cataract removed 1 month ago, will go back for other cataract next month Last urine microalbumin:  Less than a year Last filament test:  uncertain  Objective:  See above.  Seen with husband No new skin lesions Patient alert, good eye contact Filament testing: normal  Assessment: stable  Plan: hgb A1c, CMET

## 2012-12-09 ENCOUNTER — Telehealth: Payer: Self-pay | Admitting: *Deleted

## 2012-12-09 DIAGNOSIS — D509 Iron deficiency anemia, unspecified: Secondary | ICD-10-CM

## 2012-12-09 NOTE — Telephone Encounter (Signed)
Labs in EPIC. Left a message for patient to call me. 

## 2012-12-09 NOTE — Telephone Encounter (Signed)
Spoke with patient and she will come in for labs.

## 2012-12-09 NOTE — Telephone Encounter (Signed)
Message copied by Daphine Deutscher on Wed Dec 09, 2012  1:22 PM ------      Message from: Hart Carwin      Created: Wed Dec 09, 2012 12:23 PM       She is on Iron supplements. Please check her Fe, TIBC and ferritin so we can decide   her iron dose.      ----- Message -----         From: Daphine Deutscher, RN         Sent: 12/09/2012  11:29 AM           To: Hart Carwin, MD            Dr. Juanda Chance,      This patient had labs on 11/05/12 for PCP. She is due for labs with [place] also. Looks like the 1/16 labs included everything except iron panel. Do you want her to have any labs for [place]?      Connery Shiffler       ------

## 2012-12-15 ENCOUNTER — Other Ambulatory Visit (INDEPENDENT_AMBULATORY_CARE_PROVIDER_SITE_OTHER): Payer: Medicare Other

## 2012-12-15 DIAGNOSIS — D509 Iron deficiency anemia, unspecified: Secondary | ICD-10-CM | POA: Diagnosis not present

## 2012-12-15 DIAGNOSIS — E119 Type 2 diabetes mellitus without complications: Secondary | ICD-10-CM | POA: Diagnosis not present

## 2012-12-15 LAB — CBC WITH DIFFERENTIAL/PLATELET
Basophils Absolute: 0 10*3/uL (ref 0.0–0.1)
Eosinophils Absolute: 0.4 10*3/uL (ref 0.0–0.7)
Hemoglobin: 12.7 g/dL (ref 12.0–15.0)
Lymphocytes Relative: 32.2 % (ref 12.0–46.0)
MCHC: 33.9 g/dL (ref 30.0–36.0)
Monocytes Relative: 7 % (ref 3.0–12.0)
Neutrophils Relative %: 56 % (ref 43.0–77.0)
Platelets: 289 10*3/uL (ref 150.0–400.0)
RDW: 12.7 % (ref 11.5–14.6)

## 2012-12-15 LAB — IBC PANEL
Iron: 117 ug/dL (ref 42–145)
Saturation Ratios: 37.5 % (ref 20.0–50.0)
Transferrin: 222.8 mg/dL (ref 212.0–360.0)

## 2012-12-15 LAB — IRON AND TIBC: UIBC: 153 ug/dL (ref 125–400)

## 2012-12-30 DIAGNOSIS — Z961 Presence of intraocular lens: Secondary | ICD-10-CM | POA: Diagnosis not present

## 2012-12-30 DIAGNOSIS — H4011X0 Primary open-angle glaucoma, stage unspecified: Secondary | ICD-10-CM | POA: Diagnosis not present

## 2012-12-30 DIAGNOSIS — H2510 Age-related nuclear cataract, unspecified eye: Secondary | ICD-10-CM | POA: Diagnosis not present

## 2013-01-07 ENCOUNTER — Other Ambulatory Visit: Payer: Self-pay

## 2013-01-07 MED ORDER — FENOFIBRATE 48 MG PO TABS: 48.0000 mg | ORAL_TABLET | Freq: Every day | ORAL | Status: DC

## 2013-02-05 DIAGNOSIS — H269 Unspecified cataract: Secondary | ICD-10-CM | POA: Diagnosis not present

## 2013-02-09 DIAGNOSIS — N189 Chronic kidney disease, unspecified: Secondary | ICD-10-CM | POA: Diagnosis not present

## 2013-02-09 DIAGNOSIS — H2589 Other age-related cataract: Secondary | ICD-10-CM | POA: Diagnosis not present

## 2013-02-09 DIAGNOSIS — E119 Type 2 diabetes mellitus without complications: Secondary | ICD-10-CM | POA: Diagnosis not present

## 2013-02-10 DIAGNOSIS — H2510 Age-related nuclear cataract, unspecified eye: Secondary | ICD-10-CM | POA: Diagnosis not present

## 2013-02-10 DIAGNOSIS — Z961 Presence of intraocular lens: Secondary | ICD-10-CM | POA: Diagnosis not present

## 2013-02-15 DIAGNOSIS — Z961 Presence of intraocular lens: Secondary | ICD-10-CM | POA: Insufficient documentation

## 2013-04-16 ENCOUNTER — Other Ambulatory Visit: Payer: Self-pay | Admitting: Family Medicine

## 2013-05-06 ENCOUNTER — Ambulatory Visit (INDEPENDENT_AMBULATORY_CARE_PROVIDER_SITE_OTHER): Payer: Medicare Other | Admitting: Family Medicine

## 2013-05-06 ENCOUNTER — Ambulatory Visit: Payer: Medicare Other

## 2013-05-06 ENCOUNTER — Encounter: Payer: Self-pay | Admitting: Family Medicine

## 2013-05-06 VITALS — BP 136/58 | HR 59 | Temp 97.7°F | Resp 16 | Ht 66.0 in | Wt 141.2 lb

## 2013-05-06 DIAGNOSIS — R5383 Other fatigue: Secondary | ICD-10-CM | POA: Diagnosis not present

## 2013-05-06 DIAGNOSIS — R911 Solitary pulmonary nodule: Secondary | ICD-10-CM | POA: Diagnosis not present

## 2013-05-06 DIAGNOSIS — R05 Cough: Secondary | ICD-10-CM

## 2013-05-06 DIAGNOSIS — R5381 Other malaise: Secondary | ICD-10-CM | POA: Diagnosis not present

## 2013-05-06 DIAGNOSIS — R531 Weakness: Secondary | ICD-10-CM

## 2013-05-06 DIAGNOSIS — D7589 Other specified diseases of blood and blood-forming organs: Secondary | ICD-10-CM

## 2013-05-06 DIAGNOSIS — E119 Type 2 diabetes mellitus without complications: Secondary | ICD-10-CM

## 2013-05-06 DIAGNOSIS — R059 Cough, unspecified: Secondary | ICD-10-CM | POA: Diagnosis not present

## 2013-05-06 LAB — POCT CBC
Granulocyte percent: 68.8 %G (ref 37–80)
HCT, POC: 42.3 % (ref 37.7–47.9)
Hemoglobin: 13.2 g/dL (ref 12.2–16.2)
Lymph, poc: 2.1 (ref 0.6–3.4)
MCH, POC: 31.2 pg (ref 27–31.2)
MCHC: 31.2 g/dL — AB (ref 31.8–35.4)
MCV: 100 fL — AB (ref 80–97)
MID (cbc): 0.6 (ref 0–0.9)
MPV: 8 fL (ref 0–99.8)
POC Granulocyte: 5.8 (ref 2–6.9)
POC LYMPH PERCENT: 24.5 %L (ref 10–50)
POC MID %: 6.7 %M (ref 0–12)
Platelet Count, POC: 311 10*3/uL (ref 142–424)
RBC: 4.23 M/uL (ref 4.04–5.48)
RDW, POC: 12.6 %
WBC: 8.5 10*3/uL (ref 4.6–10.2)

## 2013-05-06 LAB — COMPREHENSIVE METABOLIC PANEL
ALT: 14 U/L (ref 0–35)
AST: 19 U/L (ref 0–37)
Albumin: 3.9 g/dL (ref 3.5–5.2)
Alkaline Phosphatase: 44 U/L (ref 39–117)
BUN: 25 mg/dL — ABNORMAL HIGH (ref 6–23)
CO2: 30 mEq/L (ref 19–32)
Calcium: 10.4 mg/dL (ref 8.4–10.5)
Chloride: 101 mEq/L (ref 96–112)
Creat: 1.32 mg/dL — ABNORMAL HIGH (ref 0.50–1.10)
Glucose, Bld: 219 mg/dL — ABNORMAL HIGH (ref 70–99)
Potassium: 4.4 mEq/L (ref 3.5–5.3)
Sodium: 139 mEq/L (ref 135–145)
Total Bilirubin: 0.4 mg/dL (ref 0.3–1.2)
Total Protein: 6.8 g/dL (ref 6.0–8.3)

## 2013-05-06 LAB — VITAMIN B12: Vitamin B-12: 949 pg/mL — ABNORMAL HIGH (ref 211–911)

## 2013-05-06 LAB — POCT GLYCOSYLATED HEMOGLOBIN (HGB A1C): Hemoglobin A1C: 7.5

## 2013-05-06 LAB — GLUCOSE, POCT (MANUAL RESULT ENTRY): POC Glucose: 229 mg/dl — AB (ref 70–99)

## 2013-05-06 MED ORDER — METFORMIN HCL 500 MG PO TABS: 500.0000 mg | ORAL_TABLET | Freq: Every day | ORAL | Status: DC

## 2013-05-06 NOTE — Progress Notes (Signed)
79 yo woman here for 6 month follow up on diabetes, hyperlipidemia, asthma.  Husband says she eats sweets every day  Husband notes two spells of weakness in last two months, and she has developed a hacking cough as well. Has some point out the patient has had a peptic ulcer and needed transfusions in the past.  The weak spells were attributed to low sugar despite not taking any meds for diabetes.  Spells lasted just 10-15 minutes and responded to orange juice.  It could also have been overextending herself in the garden.  Objective:  NAD, alert, seen with husband in room, patient very talkative and husband obviously put off by her rambling speech HEENT:  Unremarkable Neck: no bruit, supple, no adenop, good ROM Chest:  Bibasilar dry rales, no wheezes Heart: reg, no murmur Ext: no edema Skin:  Clear.  Results for orders placed in visit on 05/06/13  POCT GLYCOSYLATED HEMOGLOBIN (HGB A1C)      Result Value Range   Hemoglobin A1C 7.5    GLUCOSE, POCT (MANUAL RESULT ENTRY)      Result Value Range   POC Glucose 229 (*) 70 - 99 mg/dl  POCT CBC      Result Value Range   WBC 8.5  4.6 - 10.2 K/uL   Lymph, poc 2.1  0.6 - 3.4   POC LYMPH PERCENT 24.5  10 - 50 %L   MID (cbc) 0.6  0 - 0.9   POC MID % 6.7  0 - 12 %M   POC Granulocyte 5.8  2 - 6.9   Granulocyte percent 68.8  37 - 80 %G   RBC 4.23  4.04 - 5.48 M/uL   Hemoglobin 13.2  12.2 - 16.2 g/dL   HCT, POC 96.0  45.4 - 47.9 %   MCV 100.0 (*) 80 - 97 fL   MCH, POC 31.2  27 - 31.2 pg   MCHC 31.2 (*) 31.8 - 35.4 g/dL   RDW, POC 09.8     Platelet Count, POC 311  142 - 424 K/uL   MPV 8.0  0 - 99.8 fL  [facility] reading (PRIMARY) by  Dr. Milus Glazier:  Small nodule Left lower lung field with blunted CP angle.   Assessment:  Elevated sugar, macrocytosis, lung nodule  Cough - Plan: DG Chest 2 View, CT Chest W Contrast, CANCELED: DG Chest 2 View  Diabetes type 2, controlled - Plan: POCT glycosylated hemoglobin (Hb A1C), POCT glucose (manual entry),  Comprehensive metabolic panel, Microalbumin, urine, metFORMIN (GLUCOPHAGE) 500 MG tablet, CANCELED: CBC  Weakness - Plan: POCT CBC, CANCELED: CBC  Lung nodule - Plan: CT Chest W Contrast  Macrocytosis - Plan: Vitamin B12  Signed, Elvina Sidle, MD

## 2013-05-07 ENCOUNTER — Other Ambulatory Visit: Payer: Self-pay | Admitting: Family Medicine

## 2013-05-07 ENCOUNTER — Telehealth: Payer: Self-pay | Admitting: Radiology

## 2013-05-07 ENCOUNTER — Ambulatory Visit
Admission: RE | Admit: 2013-05-07 | Discharge: 2013-05-07 | Disposition: A | Discharge status: Home / Self Care | Payer: Medicare Other | Source: Ambulatory Visit | Attending: Family Medicine | Admitting: Family Medicine

## 2013-05-07 DIAGNOSIS — K8689 Other specified diseases of pancreas: Secondary | ICD-10-CM

## 2013-05-07 DIAGNOSIS — R911 Solitary pulmonary nodule: Secondary | ICD-10-CM

## 2013-05-07 DIAGNOSIS — K449 Diaphragmatic hernia without obstruction or gangrene: Secondary | ICD-10-CM

## 2013-05-07 DIAGNOSIS — R918 Other nonspecific abnormal finding of lung field: Secondary | ICD-10-CM

## 2013-05-07 DIAGNOSIS — J984 Other disorders of lung: Secondary | ICD-10-CM | POA: Diagnosis not present

## 2013-05-07 DIAGNOSIS — R05 Cough: Secondary | ICD-10-CM

## 2013-05-07 HISTORY — DX: Diaphragmatic hernia without obstruction or gangrene: K44.9

## 2013-05-07 HISTORY — DX: Other nonspecific abnormal finding of lung field: R91.8

## 2013-05-07 HISTORY — DX: Other specified diseases of pancreas: K86.89

## 2013-05-07 LAB — MICROALBUMIN, URINE: Microalb, Ur: 4.04 mg/dL — ABNORMAL HIGH (ref 0.00–1.89)

## 2013-05-07 MED ORDER — IOHEXOL 300 MG/ML  SOLN
75.0000 mL | Freq: Once | INTRAMUSCULAR | Status: AC | PRN
  Administered 2013-05-07: 75 mL via INTRAVENOUS

## 2013-05-07 NOTE — Telephone Encounter (Signed)
Radiologist called and spoke to Dr Merla Riches concerning CT scan. Message to Dr Merla Riches, so we can advise Dr Milus Glazier

## 2013-05-07 NOTE — Telephone Encounter (Signed)
Nodules ok but can see dilation of pancreatic duct to 8mm with atrophy of pancreas--?stone obstructing Looks like asympto so I didn't call her to say anything B

## 2013-05-12 ENCOUNTER — Encounter: Payer: Self-pay | Admitting: *Deleted

## 2013-05-13 ENCOUNTER — Telehealth: Payer: Self-pay

## 2013-05-13 NOTE — Telephone Encounter (Signed)
Pt's husband called w/questions about pts protein in urine, referral to GI and CT results. D/W him Dr Cain Saupe notes on labs, OV notes and CT results and answered his questions.

## 2013-05-16 ENCOUNTER — Other Ambulatory Visit: Payer: Self-pay | Admitting: Family Medicine

## 2013-05-18 ENCOUNTER — Other Ambulatory Visit: Payer: Self-pay

## 2013-05-18 MED ORDER — FENOFIBRATE 48 MG PO TABS: 48.0000 mg | ORAL_TABLET | Freq: Every day | ORAL | Status: DC

## 2013-05-24 DIAGNOSIS — H2510 Age-related nuclear cataract, unspecified eye: Secondary | ICD-10-CM | POA: Diagnosis not present

## 2013-05-24 DIAGNOSIS — H4011X0 Primary open-angle glaucoma, stage unspecified: Secondary | ICD-10-CM | POA: Diagnosis not present

## 2013-06-15 ENCOUNTER — Encounter: Payer: Self-pay | Admitting: Internal Medicine

## 2013-06-15 ENCOUNTER — Other Ambulatory Visit (INDEPENDENT_AMBULATORY_CARE_PROVIDER_SITE_OTHER): Payer: Medicare Other

## 2013-06-15 ENCOUNTER — Ambulatory Visit (INDEPENDENT_AMBULATORY_CARE_PROVIDER_SITE_OTHER): Payer: Medicare Other | Admitting: Internal Medicine

## 2013-06-15 VITALS — BP 132/64 | HR 68 | Ht 66.0 in | Wt 138.8 lb

## 2013-06-15 DIAGNOSIS — R932 Abnormal findings on diagnostic imaging of liver and biliary tract: Secondary | ICD-10-CM | POA: Diagnosis not present

## 2013-06-15 DIAGNOSIS — K8689 Other specified diseases of pancreas: Secondary | ICD-10-CM

## 2013-06-15 LAB — CREATININE, SERUM: Creatinine, Ser: 1.3 mg/dL — ABNORMAL HIGH (ref 0.4–1.2)

## 2013-06-15 LAB — HEPATIC FUNCTION PANEL
AST: 23 U/L (ref 0–37)
Alkaline Phosphatase: 40 U/L (ref 39–117)
Bilirubin, Direct: 0.1 mg/dL (ref 0.0–0.3)
Total Protein: 7.5 g/dL (ref 6.0–8.3)

## 2013-06-15 NOTE — Progress Notes (Signed)
[name] 02/26/1931 MRN [id number]        History of Present Illness:  This is a 79-year-old white female status post massive GI blood loss in September 2013 probably from AVM's. now comes for evaluation of  abnormal imaging abnormality in the head of the pancreas. Patient underwent the CT scan of the chest for followup of pulmonary nodules which appeared to be stable but at the same time showed new dilation of the main pancreatic duct up to 8.4 mm in diameter,in the head of the pancreas with calcifications in the ampulla suggesting stone. This abnormality was not present last year. Upper abdominal ultrasound done in March 2005 showed normal common bile duct of 2-3 mm, fatty liver and suboptimal visualization of the tail of the pancreas. Patient denies any weight loss, abdominal pain dyspepsia. She had an upper endoscopy in September 2013 as well as colonoscopy which showed  mild esophageal stricture and tubular adenoma which was removed. Small bowel capsule endoscopy showed AVMs. Her hemoglobin has remained stable    Past Medical History  Diagnosis Date  . Anemia   . Hypertension   . GERD (gastroesophageal reflux disease)   . Diabetes mellitus   . Hyperlipidemia   . DVT (deep venous thrombosis) 03/2005  . Glaucoma   . Cataract   . Asthma   . Esophageal stricture   . Tubular adenoma of colon   . Diverticulosis   . Osteoarthritis   . PVD (peripheral vascular disease)   . Blood transfusion 05-24-12  . Chronic kidney disease     dr [place]  . Blood transfusion without reported diagnosis   . Pulmonary nodules 05/07/13  . Hiatal hernia 05/07/13  . Pancreatic duct dilated 05/07/13   Past Surgical History  Procedure Laterality Date  . Vascular surgery  09/2004  . Wrist fracture surgery      R wrist fracture, plate  . Esophagogastroduodenoscopy  05/26/2012    Procedure: ESOPHAGOGASTRODUODENOSCOPY (EGD);  Surgeon: Doretha Goding M Devell Parkerson, MD;  Location: [facility];  Service: Endoscopy;   Laterality: N/A;  . Colonoscopy  05/27/2012    Procedure: COLONOSCOPY;  Surgeon: Jeronica Stlouis M Kallee Nam, MD;  Location: [facility];  Service: Endoscopy;  Laterality: N/A;  . Eye surgery      reports that she has never smoked. She has never used smokeless tobacco. She reports that she does not drink alcohol or use illicit drugs. family history includes Cancer in her son; Migraines in her daughter. There is no history of Colon cancer. She was adopted. No Known Allergies      Review of Systems: Negative for weight loss abdominal pain nausea vomiting  The remainder of the 10 point ROS is negative except as outlined in H&P   Physical Exam: General appearance  Well developed, in no distress. Eyes- non icteric. HEENT nontraumatic, normocephalic. Mouth no lesions, tongue papillated, no cheilosis. Neck supple without adenopathy, thyroid not enlarged, no carotid bruits, no JVD. Lungs Clear to auscultation bilaterally. Cor normal S1, normal S2, regular rhythm, no murmur,  quiet precordium. Abdomen: Soft nontender with normoactive bowel sounds. No distention. No bruits Rectal: Small amount of soft Hemoccult negative stool Extremities no pedal edema. Skin no lesions. Neurological alert and oriented x 3. Psychological normal mood and affect.  Assessment and Plan:  Problem #1 79-year-old white female with a new dilatation of the main pancreatic duct located in the head of the pancreas associated with small calcification, stone or small tumor.on CT scan  Imaging of the chest which was done   to follow up on stable pulmonary nodules. We will check her amylase, lipase and CA 19-9 as well as liver function tests, BUN and creatinine. We will schedule her for an MRCP to evaluate the main pancreatic duct and the region of the ampulla. Depending on the findings, she may need either EUS or ERCP.She remains asymptomatic as to any symptoms of pancreatitis.   06/15/2013 Virat Prather  

## 2013-06-15 NOTE — Patient Instructions (Addendum)
Your physician has requested that you go to the basement for the following lab work before leaving today: Amylase, Lipase, BUN, Creatinine, CA 19-9  You have been scheduled for an MRI of the pancreas [facility] (1st floor of hospital) on 06/18/13 at 7:00 pm. Please arrive 15 minutes prior to your appointment for registration. Make certain not to have anything to eat or drink 6 hours prior to your appointment. Should you need to reschedule your appointment, please contact radiology at [phone].  Dr Milus Glazier

## 2013-06-18 ENCOUNTER — Ambulatory Visit (HOSPITAL_COMMUNITY)
Admission: RE | Admit: 2013-06-18 | Discharge: 2013-06-18 | Disposition: A | Discharge status: Home / Self Care | Payer: Medicare Other | Source: Ambulatory Visit | Attending: Internal Medicine | Admitting: Internal Medicine

## 2013-06-18 ENCOUNTER — Other Ambulatory Visit: Payer: Self-pay | Admitting: Internal Medicine

## 2013-06-18 DIAGNOSIS — N281 Cyst of kidney, acquired: Secondary | ICD-10-CM | POA: Diagnosis not present

## 2013-06-18 DIAGNOSIS — K8689 Other specified diseases of pancreas: Secondary | ICD-10-CM | POA: Insufficient documentation

## 2013-06-18 DIAGNOSIS — R935 Abnormal findings on diagnostic imaging of other abdominal regions, including retroperitoneum: Secondary | ICD-10-CM | POA: Diagnosis not present

## 2013-06-18 DIAGNOSIS — K8020 Calculus of gallbladder without cholecystitis without obstruction: Secondary | ICD-10-CM | POA: Diagnosis not present

## 2013-06-18 MED ORDER — GADOBENATE DIMEGLUMINE 529 MG/ML IV SOLN
10.0000 mL | Freq: Once | INTRAVENOUS | Status: AC
  Administered 2013-06-18: 10 mL via INTRAVENOUS

## 2013-06-23 ENCOUNTER — Other Ambulatory Visit: Payer: Self-pay

## 2013-06-23 ENCOUNTER — Telehealth: Payer: Self-pay | Admitting: Gastroenterology

## 2013-06-23 DIAGNOSIS — K8689 Other specified diseases of pancreas: Secondary | ICD-10-CM

## 2013-06-23 NOTE — Telephone Encounter (Signed)
Left message on machine to call back  

## 2013-06-23 NOTE — Telephone Encounter (Signed)
Dora,  I looked over her scans, labs.  I think EUS is next best step.  Will plan to get a good look at ampulla with side view scope as well.    Patty,  Can you get in touch with her about upper EUS, radial +/- linear, 60 min, MAC ++, dx: dilated pancreatic duct.  Next avail EUS Thursday./

## 2013-06-23 NOTE — Telephone Encounter (Signed)
EUS scheduled, pt instructed and medications reviewed.  Patient instructions mailed to home.  Patient to call with any questions or concerns.  

## 2013-06-24 ENCOUNTER — Other Ambulatory Visit: Payer: Self-pay | Admitting: *Deleted

## 2013-06-24 MED ORDER — FAMOTIDINE 20 MG PO TABS: 20.0000 mg | ORAL_TABLET | Freq: Every day | ORAL | Status: DC

## 2013-06-24 NOTE — Telephone Encounter (Signed)
I have spoken to both Mr and Mrs Limburg about EUS and results of  MRCP. They will be awaiting call from Patti, Dr Christella Hartigan nurse with scheduling details

## 2013-06-29 ENCOUNTER — Telehealth: Payer: Self-pay | Admitting: *Deleted

## 2013-06-29 NOTE — Telephone Encounter (Signed)
No, shoul should be on Famotidine 20 mg 1x/day only.

## 2013-06-29 NOTE — Telephone Encounter (Signed)
[facility] pharmacy that rx should read as sent (famotidine 20 mg once daily). Only #90 for 3 month supply will be given. They state that they will advise patient.

## 2013-06-29 NOTE — Telephone Encounter (Signed)
I received a refill request for patient's famotidine 20 mg from the pharmacy on 06/24/13 which I filled as famotidine 20 mg 1 tablet qhs (as noted patient was taking at office visit 07/14/12). However, I have now received a note from the pharmacy stating that the patient is taking the famotidine 20 mg 2 tablets daily. Are you okay with her increasing her dosage to 40 mg daily? If so, I will okay additional medication (I gave her #90, so I would need to give her #180 instead).

## 2013-06-30 ENCOUNTER — Encounter (HOSPITAL_COMMUNITY): Payer: Self-pay | Admitting: Pharmacy Technician

## 2013-06-30 ENCOUNTER — Encounter (HOSPITAL_COMMUNITY): Payer: Self-pay | Admitting: *Deleted

## 2013-07-08 ENCOUNTER — Encounter (HOSPITAL_COMMUNITY)
Admission: RE | Disposition: A | Discharge status: Home / Self Care | Payer: Self-pay | Source: Ambulatory Visit | Attending: Gastroenterology

## 2013-07-08 ENCOUNTER — Encounter (HOSPITAL_COMMUNITY): Payer: Self-pay | Admitting: Anesthesiology

## 2013-07-08 ENCOUNTER — Ambulatory Visit (HOSPITAL_COMMUNITY)
Admission: RE | Admit: 2013-07-08 | Discharge: 2013-07-08 | Disposition: A | Discharge status: Home / Self Care | Payer: Medicare Other | Source: Ambulatory Visit | Attending: Gastroenterology | Admitting: Gastroenterology

## 2013-07-08 ENCOUNTER — Ambulatory Visit (HOSPITAL_COMMUNITY): Payer: Medicare Other | Admitting: Anesthesiology

## 2013-07-08 DIAGNOSIS — E785 Hyperlipidemia, unspecified: Secondary | ICD-10-CM | POA: Diagnosis not present

## 2013-07-08 DIAGNOSIS — N189 Chronic kidney disease, unspecified: Secondary | ICD-10-CM | POA: Diagnosis not present

## 2013-07-08 DIAGNOSIS — I129 Hypertensive chronic kidney disease with stage 1 through stage 4 chronic kidney disease, or unspecified chronic kidney disease: Secondary | ICD-10-CM | POA: Diagnosis not present

## 2013-07-08 DIAGNOSIS — Z86718 Personal history of other venous thrombosis and embolism: Secondary | ICD-10-CM | POA: Insufficient documentation

## 2013-07-08 DIAGNOSIS — R918 Other nonspecific abnormal finding of lung field: Secondary | ICD-10-CM | POA: Diagnosis not present

## 2013-07-08 DIAGNOSIS — R948 Abnormal results of function studies of other organs and systems: Secondary | ICD-10-CM | POA: Diagnosis not present

## 2013-07-08 DIAGNOSIS — K219 Gastro-esophageal reflux disease without esophagitis: Secondary | ICD-10-CM | POA: Diagnosis not present

## 2013-07-08 DIAGNOSIS — K8689 Other specified diseases of pancreas: Secondary | ICD-10-CM | POA: Diagnosis not present

## 2013-07-08 DIAGNOSIS — E119 Type 2 diabetes mellitus without complications: Secondary | ICD-10-CM | POA: Diagnosis not present

## 2013-07-08 DIAGNOSIS — K8020 Calculus of gallbladder without cholecystitis without obstruction: Secondary | ICD-10-CM | POA: Insufficient documentation

## 2013-07-08 HISTORY — PX: EUS: SHX5427

## 2013-07-08 SURGERY — UPPER ENDOSCOPIC ULTRASOUND (EUS) LINEAR
Anesthesia: Monitor Anesthesia Care

## 2013-07-08 MED ORDER — PROPOFOL INFUSION 10 MG/ML OPTIME
INTRAVENOUS | Status: DC | PRN
  Administered 2013-07-08: 120 ug/kg/min via INTRAVENOUS

## 2013-07-08 MED ORDER — LACTATED RINGERS IV SOLN
INTRAVENOUS | Status: DC
  Administered 2013-07-08: 09:00:00 via INTRAVENOUS

## 2013-07-08 MED ORDER — FENTANYL CITRATE 0.05 MG/ML IJ SOLN
INTRAMUSCULAR | Status: DC | PRN
  Administered 2013-07-08: 50 ug via INTRAVENOUS

## 2013-07-08 MED ORDER — MIDAZOLAM HCL 5 MG/5ML IJ SOLN
INTRAMUSCULAR | Status: DC | PRN
  Administered 2013-07-08: 1 mg via INTRAVENOUS

## 2013-07-08 MED ORDER — BUTAMBEN-TETRACAINE-BENZOCAINE 2-2-14 % EX AERO
INHALATION_SPRAY | CUTANEOUS | Status: DC | PRN
  Administered 2013-07-08: 1 via TOPICAL

## 2013-07-08 MED ORDER — SODIUM CHLORIDE 0.9 % IV SOLN: INTRAVENOUS | Status: DC

## 2013-07-08 MED ORDER — LIDOCAINE HCL (CARDIAC) 20 MG/ML IV SOLN
INTRAVENOUS | Status: DC | PRN
  Administered 2013-07-08: 75 mg via INTRAVENOUS

## 2013-07-08 MED ORDER — LABETALOL HCL 5 MG/ML IV SOLN
INTRAVENOUS | Status: DC | PRN
  Administered 2013-07-08: 5 mg via INTRAVENOUS

## 2013-07-08 NOTE — Op Note (Addendum)
[facility] [address] [place], [address]   ENDOSCOPIC ULTRASOUND PROCEDURE REPORT PATIENT: [name], [name]  MR#: [id number] BIRTHDATE: 1930/12/09  GENDER: Female ENDOSCOPIST: Rachael Fee, MD REFERRED BY:  Hart Carwin, M.D. PROCEDURE DATE:  07/08/2013 PROCEDURE:   Upper EUS ASA CLASS:      Class III INDICATIONS:   incidentally noted, abnormal pancreatic duct; CT scan clearly showed dense calcification in head of pancreas, ? within distal PD; MRI/MRCP "the calcification is not well demonstrated." No weight loss, normal bowels, no FH of pancreatic disease, no personal history of pancreatic disease, no abdominal pains, normal LFTs, normal CA 19-9. MEDICATIONS: MAC sedation, administered by CRNA DESCRIPTION OF PROCEDURE:   After the risks benefits and alternatives of the procedure were  explained, informed consent was obtained. The patient was then placed in the left, lateral, decubitus postion and IV sedation was administered. Throughout the procedure, the patients blood pressure, pulse and oxygen saturations were monitored continuously.  Under direct visualization, the [employer] Radial EUS [id number]  endoscope was introduced through the mouth  and advanced to the second portion of the duodenum .  Water was used as necessary to provide an acoustic interface.  Upon completion of the imaging, water was removed and the patient was sent to the recovery room in satisfactory condition.  Endoscopic findings: 1. Friable mucosa throughout GI tract.  Minor scope trauma at GE junction from scope passage, also suction related oozing of mucosa in stomach, duodenum. 2. Major papilla clearly seen with duodenoscope, was normal. Impression: 1. The pancreatic duct was diffusely abnormal throughout its course. The main PD was dilated upt ot 8-53mm in head of pancreas, the main duct was then dilated and ectatic throughout the gland and there were multiple dilated side branches.  In very distal PD there was a shadowing calcification consistent with pancreatic duct stone or head of pancreas calcification.  No soft tissue masses were seen in pancreas and the parenchyma throughout the gland was a bit lobular, atrophic but no obvious changes of chronic pancreatitis were seen. 2. CBD was normal, non-dilated 3. NO peripancreatic adenopathy 4. There were small stones in the gallbladder. 5. Limited views of liver, spleen, portal and splenic vessels were all normal. Impression: Shadowing calcification in head of pancreas that is most likely a disal pancreatic duct stone.  This is causing diffuse dilation of the pancreatic duct throughout the gland.  The CBD is not dilated and there are no clear soft tissue masses.  Dr. Juanda Chance and I discussed this at length and have agreed to refer her to Dr. Corliss Parish at [facility] to consider ERCP, removal of distal PD stone if he feels it is needed.  _______________________________ eSignedRachael Fee, MD 07/08/2013 11:10 AM Revised: 07/08/2013 11:10 AM

## 2013-07-08 NOTE — Discharge Instructions (Signed)

## 2013-07-08 NOTE — H&P (View-Only) (Signed)
[name] 1931/08/06 MRN [id number]        History of Present Illness:  This is a 79 year old white female status post massive GI blood loss in September 2013 probably from AVM's. now comes for evaluation of  abnormal imaging abnormality in the head of the pancreas. Patient underwent the CT scan of the chest for followup of pulmonary nodules which appeared to be stable but at the same time showed new dilation of the main pancreatic duct up to 8.4 mm in diameter,in the head of the pancreas with calcifications in the ampulla suggesting stone. This abnormality was not present last year. Upper abdominal ultrasound done in March 2005 showed normal common bile duct of 2-3 mm, fatty liver and suboptimal visualization of the tail of the pancreas. Patient denies any weight loss, abdominal pain dyspepsia. She had an upper endoscopy in September 2013 as well as colonoscopy which showed  mild esophageal stricture and tubular adenoma which was removed. Small bowel capsule endoscopy showed AVMs. Her hemoglobin has remained stable    Past Medical History  Diagnosis Date  . Anemia   . Hypertension   . GERD (gastroesophageal reflux disease)   . Diabetes mellitus   . Hyperlipidemia   . DVT (deep venous thrombosis) 03/2005  . Glaucoma   . Cataract   . Asthma   . Esophageal stricture   . Tubular adenoma of colon   . Diverticulosis   . Osteoarthritis   . PVD (peripheral vascular disease)   . Blood transfusion 05-24-12  . Chronic kidney disease     dr Lacy Duverney  . Blood transfusion without reported diagnosis   . Pulmonary nodules 05/07/13  . Hiatal hernia 05/07/13  . Pancreatic duct dilated 05/07/13   Past Surgical History  Procedure Laterality Date  . Vascular surgery  09/2004  . Wrist fracture surgery      R wrist fracture, plate  . Esophagogastroduodenoscopy  05/26/2012    Procedure: ESOPHAGOGASTRODUODENOSCOPY (EGD);  Surgeon: Hart Carwin, MD;  Location: [facility];  Service: Endoscopy;   Laterality: N/A;  . Colonoscopy  05/27/2012    Procedure: COLONOSCOPY;  Surgeon: Hart Carwin, MD;  Location: [facility];  Service: Endoscopy;  Laterality: N/A;  . Eye surgery      reports that she has never smoked. She has never used smokeless tobacco. She reports that she does not drink alcohol or use illicit drugs. family history includes Cancer in her son; Migraines in her daughter. There is no history of Colon cancer. She was adopted. No Known Allergies      Review of Systems: Negative for weight loss abdominal pain nausea vomiting  The remainder of the 10 point ROS is negative except as outlined in H&P   Physical Exam: General appearance  Well developed, in no distress. Eyes- non icteric. HEENT nontraumatic, normocephalic. Mouth no lesions, tongue papillated, no cheilosis. Neck supple without adenopathy, thyroid not enlarged, no carotid bruits, no JVD. Lungs Clear to auscultation bilaterally. Cor normal S1, normal S2, regular rhythm, no murmur,  quiet precordium. Abdomen: Soft nontender with normoactive bowel sounds. No distention. No bruits Rectal: Small amount of soft Hemoccult negative stool Extremities no pedal edema. Skin no lesions. Neurological alert and oriented x 3. Psychological normal mood and affect.  Assessment and Plan:  Problem #75 79 year old white female with a new dilatation of the main pancreatic duct located in the head of the pancreas associated with small calcification, stone or small tumor.on CT scan  Imaging of the chest which was done  to follow up on stable pulmonary nodules. We will check her amylase, lipase and CA 19-9 as well as liver function tests, BUN and creatinine. We will schedule her for an MRCP to evaluate the main pancreatic duct and the region of the ampulla. Depending on the findings, she may need either EUS or ERCP.She remains asymptomatic as to any symptoms of pancreatitis.   06/15/2013 Lina Sar

## 2013-07-08 NOTE — Preoperative (Signed)
Beta Blockers   Reason not to administer Beta Blockers:Not Applicable 

## 2013-07-08 NOTE — Anesthesia Postprocedure Evaluation (Signed)
  Anesthesia Post-op Note  Patient: [name]  Procedure(s) Performed: Procedure(s) (LRB): UPPER ENDOSCOPIC ULTRASOUND (EUS) LINEAR (N/A)  Patient Location: PACU  Anesthesia Type: MAC  Level of Consciousness: awake and alert   Airway and Oxygen Therapy: Patient Spontanous Breathing  Post-op Pain: mild  Post-op Assessment: Post-op Vital signs reviewed, Patient's Cardiovascular Status Stable, Respiratory Function Stable, Patent Airway and No signs of Nausea or vomiting  Last Vitals:  Filed Vitals:   07/08/13 1132  BP: 134/78  Pulse:   Temp:   Resp: 16    Post-op Vital Signs: stable   Complications: No apparent anesthesia complications

## 2013-07-08 NOTE — Anesthesia Preprocedure Evaluation (Signed)
Anesthesia Evaluation  Patient identified by MRN, date of birth, ID band Patient awake    Reviewed: Allergy & Precautions, H&P , NPO status , Patient's Chart, lab work & pertinent test results  Airway Mallampati: II TM Distance: >3 FB Neck ROM: Full    Dental no notable dental hx.    Pulmonary asthma ,  breath sounds clear to auscultation  Pulmonary exam normal       Cardiovascular hypertension, Pt. on medications and Pt. on home beta blockers + Peripheral Vascular Disease negative cardio ROS  Rhythm:Regular Rate:Normal     Neuro/Psych  Neuromuscular disease negative psych ROS   GI/Hepatic Neg liver ROS, hiatal hernia, GERD-  Medicated,  Endo/Other  negative endocrine ROSdiabetes, Type 2, Oral Hypoglycemic Agents  Renal/GU Renal diseaseChronic kidney disease.  negative genitourinary   Musculoskeletal negative musculoskeletal ROS (+)   Abdominal   Peds negative pediatric ROS (+)  Hematology negative hematology ROS (+)   Anesthesia Other Findings   Reproductive/Obstetrics negative OB ROS                           Anesthesia Physical Anesthesia Plan  ASA: III  Anesthesia Plan: MAC   Post-op Pain Management:    Induction: Intravenous  Airway Management Planned:   Additional Equipment:   Intra-op Plan:   Post-operative Plan:   Informed Consent: I have reviewed the patients History and Physical, chart, labs and discussed the procedure including the risks, benefits and alternatives for the proposed anesthesia with the patient or authorized representative who has indicated his/her understanding and acceptance.   Dental advisory given  Plan Discussed with: CRNA  Anesthesia Plan Comments:         Anesthesia Quick Evaluation

## 2013-07-08 NOTE — Transfer of Care (Signed)
Immediate Anesthesia Transfer of Care Note  Patient: [name]  Procedure(s) Performed: Procedure(s) with comments: UPPER ENDOSCOPIC ULTRASOUND (EUS) LINEAR (N/A) - need side view scope  Patient Location: PACU  Anesthesia Type:MAC  Level of Consciousness: awake, oriented, patient cooperative, lethargic and responds to stimulation  Airway & Oxygen Therapy: Patient Spontanous Breathing and Patient connected to nasal cannula oxygen  Post-op Assessment: Report given to PACU RN, Post -op Vital signs reviewed and stable and Patient moving all extremities  Post vital signs: Reviewed and stable  Complications: No apparent anesthesia complications

## 2013-07-08 NOTE — Interval H&P Note (Signed)
History and Physical Interval Note:  07/08/2013 9:40 AM  [name]  has presented today for surgery, with the diagnosis of dilated pancreatic duct   The various methods of treatment have been discussed with the patient and family. After consideration of risks, benefits and other options for treatment, the patient has consented to  Procedure(s) with comments: UPPER ENDOSCOPIC ULTRASOUND (EUS) LINEAR (N/A) - need side view scope as a surgical intervention .  The patient's history has been reviewed, patient examined, no change in status, stable for surgery.  I have reviewed the patient's chart and labs.  Questions were answered to the patient's satisfaction.     Rachael Fee

## 2013-07-09 ENCOUNTER — Encounter (HOSPITAL_COMMUNITY): Payer: Self-pay | Admitting: Gastroenterology

## 2013-07-12 ENCOUNTER — Other Ambulatory Visit: Payer: Self-pay | Admitting: Family Medicine

## 2013-07-19 ENCOUNTER — Other Ambulatory Visit: Payer: Self-pay | Admitting: Family Medicine

## 2013-09-02 DIAGNOSIS — K8689 Other specified diseases of pancreas: Secondary | ICD-10-CM | POA: Diagnosis not present

## 2013-09-06 DIAGNOSIS — I700 Atherosclerosis of aorta: Secondary | ICD-10-CM | POA: Diagnosis not present

## 2013-09-06 DIAGNOSIS — Z79899 Other long term (current) drug therapy: Secondary | ICD-10-CM | POA: Diagnosis not present

## 2013-09-06 DIAGNOSIS — D649 Anemia, unspecified: Secondary | ICD-10-CM | POA: Diagnosis not present

## 2013-09-06 DIAGNOSIS — R1310 Dysphagia, unspecified: Secondary | ICD-10-CM | POA: Diagnosis not present

## 2013-09-06 DIAGNOSIS — J45909 Unspecified asthma, uncomplicated: Secondary | ICD-10-CM | POA: Diagnosis not present

## 2013-09-06 DIAGNOSIS — I10 Essential (primary) hypertension: Secondary | ICD-10-CM | POA: Insufficient documentation

## 2013-09-06 DIAGNOSIS — I82409 Acute embolism and thrombosis of unspecified deep veins of unspecified lower extremity: Secondary | ICD-10-CM | POA: Diagnosis not present

## 2013-09-06 DIAGNOSIS — R911 Solitary pulmonary nodule: Secondary | ICD-10-CM | POA: Diagnosis not present

## 2013-09-06 DIAGNOSIS — K862 Cyst of pancreas: Secondary | ICD-10-CM | POA: Diagnosis not present

## 2013-09-06 DIAGNOSIS — K8689 Other specified diseases of pancreas: Secondary | ICD-10-CM | POA: Diagnosis not present

## 2013-09-06 DIAGNOSIS — K8020 Calculus of gallbladder without cholecystitis without obstruction: Secondary | ICD-10-CM | POA: Diagnosis not present

## 2013-09-06 DIAGNOSIS — E119 Type 2 diabetes mellitus without complications: Secondary | ICD-10-CM | POA: Diagnosis not present

## 2013-09-06 DIAGNOSIS — Z7982 Long term (current) use of aspirin: Secondary | ICD-10-CM | POA: Diagnosis not present

## 2013-09-09 ENCOUNTER — Encounter: Payer: Self-pay | Admitting: Family Medicine

## 2013-09-09 ENCOUNTER — Ambulatory Visit (INDEPENDENT_AMBULATORY_CARE_PROVIDER_SITE_OTHER): Payer: Medicare Other | Admitting: Family Medicine

## 2013-09-09 VITALS — BP 130/65 | HR 65 | Temp 97.5°F | Resp 16 | Ht 66.0 in | Wt 136.0 lb

## 2013-09-09 DIAGNOSIS — J45909 Unspecified asthma, uncomplicated: Secondary | ICD-10-CM | POA: Diagnosis not present

## 2013-09-09 DIAGNOSIS — E119 Type 2 diabetes mellitus without complications: Secondary | ICD-10-CM

## 2013-09-09 DIAGNOSIS — K869 Disease of pancreas, unspecified: Secondary | ICD-10-CM | POA: Diagnosis not present

## 2013-09-09 DIAGNOSIS — R911 Solitary pulmonary nodule: Secondary | ICD-10-CM | POA: Diagnosis not present

## 2013-09-09 DIAGNOSIS — K8689 Other specified diseases of pancreas: Secondary | ICD-10-CM

## 2013-09-09 LAB — COMPREHENSIVE METABOLIC PANEL
ALT: 12 U/L (ref 0–35)
AST: 18 U/L (ref 0–37)
Albumin: 3.9 g/dL (ref 3.5–5.2)
Alkaline Phosphatase: 37 U/L — ABNORMAL LOW (ref 39–117)
BUN: 34 mg/dL — ABNORMAL HIGH (ref 6–23)
CO2: 28 mEq/L (ref 19–32)
Calcium: 10.3 mg/dL (ref 8.4–10.5)
Chloride: 101 mEq/L (ref 96–112)
Creat: 1.4 mg/dL — ABNORMAL HIGH (ref 0.50–1.10)
Glucose, Bld: 183 mg/dL — ABNORMAL HIGH (ref 70–99)
Potassium: 4.3 mEq/L (ref 3.5–5.3)
Sodium: 138 mEq/L (ref 135–145)
Total Bilirubin: 0.4 mg/dL (ref 0.3–1.2)
Total Protein: 6.9 g/dL (ref 6.0–8.3)

## 2013-09-09 LAB — POCT GLYCOSYLATED HEMOGLOBIN (HGB A1C): Hemoglobin A1C: 6.7

## 2013-09-09 LAB — GLUCOSE, POCT (MANUAL RESULT ENTRY): POC Glucose: 191 mg/dl — AB (ref 70–99)

## 2013-09-09 MED ORDER — ALBUTEROL SULFATE HFA 108 (90 BASE) MCG/ACT IN AERS: 2.0000 | INHALATION_SPRAY | Freq: Four times a day (QID) | RESPIRATORY_TRACT | Status: DC | PRN

## 2013-09-09 NOTE — Progress Notes (Signed)
79 yo woman who lives with husband and has recently had a major workup for pancreatic duct abnormality.  She also has had a lung nodule seen last July and needs followup  She has children, grandchildren and great grandchildren.  She continues to be involved in each, though one adopted grandchild from [place] has severe ADHD and uses "bad language" which is a challenge.  We reviewed her diet which seems appropriate in light of her well-controlled diabetes.  Her sugars at home are in the 100's.  Patient denies chest pain, shortness of breath, abdominal pain, constipation, diarrhea, vomiting, new skin lesions.  Objective: No acute distress, alert. Weight is stable. HEENT: Normal Chest: Clear Heart: Regular no murmur Abdomen: Soft nontender without HSM. There is some fullness in the epigastrium but there is no bruit or pulsatile mass. Extremities: Some keratin buildup between the toes and we discussed cleaning knees. There no skin breaks. Skin has normal sensation in both feet and fingers. There is no edema in her extremities. Results for orders placed in visit on 09/09/13  GLUCOSE, POCT (MANUAL RESULT ENTRY)      Result Value Range   POC Glucose 191 (*) 70 - 99 mg/dl  POCT GLYCOSYLATED HEMOGLOBIN (HGB A1C)      Result Value Range   Hemoglobin A1C 6.7     Assessment: Patient to be doing very well. She is high functioning individual. I doubt that the lung nodule represents a serious problem but I think it's reasonable to recheck this to make sure it hasn't changed. If so we won't do any more testing on that. We're awaiting biopsies of the pancreas evaluation was done in [place] recently. Diabetes - Plan: POCT glucose (manual entry), POCT glycosylated hemoglobin (Hb A1C), Comprehensive metabolic panel  Lung nodule - Plan: CT Chest W Contrast  Asthma, chronic - Plan: albuterol (PROAIR HFA) 108 (90 BASE) MCG/ACT inhaler  Signed, Elvina Sidle, MD

## 2013-09-20 ENCOUNTER — Ambulatory Visit
Admission: RE | Admit: 2013-09-20 | Discharge: 2013-09-20 | Disposition: A | Discharge status: Home / Self Care | Payer: Medicare Other | Source: Ambulatory Visit | Attending: Family Medicine | Admitting: Family Medicine

## 2013-09-20 ENCOUNTER — Other Ambulatory Visit: Payer: Self-pay | Admitting: Family Medicine

## 2013-09-20 DIAGNOSIS — R911 Solitary pulmonary nodule: Secondary | ICD-10-CM

## 2013-09-20 DIAGNOSIS — J984 Other disorders of lung: Secondary | ICD-10-CM | POA: Diagnosis not present

## 2013-09-29 ENCOUNTER — Encounter: Payer: Self-pay | Admitting: Internal Medicine

## 2013-10-05 ENCOUNTER — Other Ambulatory Visit: Payer: Self-pay | Admitting: Family Medicine

## 2013-10-19 ENCOUNTER — Other Ambulatory Visit: Payer: Self-pay | Admitting: Family Medicine

## 2013-10-20 NOTE — Telephone Encounter (Signed)
Dr L, you saw pt in Nov for DM but didn't discuss HTN. I saw that pt's Kidney FTs were slightly elevated, and wanted to make sure it was OK to RF.

## 2013-10-21 NOTE — Telephone Encounter (Signed)
Okay to refill metoprolol for 3 months

## 2013-10-21 NOTE — Telephone Encounter (Signed)
Sent RF

## 2013-11-12 ENCOUNTER — Other Ambulatory Visit: Payer: Self-pay | Admitting: Internal Medicine

## 2013-11-12 ENCOUNTER — Other Ambulatory Visit: Payer: Self-pay | Admitting: Physician Assistant

## 2013-11-15 NOTE — Telephone Encounter (Signed)
Dr L, on last Rf I put note that pt needs OV/labs because we haven't done a Lipid panel since 03/2012. She has an appt set up, but not until May. Do you want to RF until then, or have her come in sooner for lipid test? I pended RFs.

## 2013-12-27 DIAGNOSIS — H4011X0 Primary open-angle glaucoma, stage unspecified: Secondary | ICD-10-CM | POA: Diagnosis not present

## 2014-01-29 ENCOUNTER — Other Ambulatory Visit: Payer: Self-pay | Admitting: Family Medicine

## 2014-02-01 ENCOUNTER — Other Ambulatory Visit: Payer: Self-pay

## 2014-02-01 DIAGNOSIS — Z1231 Encounter for screening mammogram for malignant neoplasm of breast: Secondary | ICD-10-CM

## 2014-02-08 ENCOUNTER — Other Ambulatory Visit: Payer: Self-pay

## 2014-02-08 MED ORDER — FENOFIBRATE 48 MG PO TABS: 48.0000 mg | ORAL_TABLET | Freq: Every day | ORAL | Status: DC

## 2014-02-08 NOTE — Telephone Encounter (Signed)
Received request for refill.  Patient would like 90 day supply. 

## 2014-02-28 ENCOUNTER — Encounter (INDEPENDENT_AMBULATORY_CARE_PROVIDER_SITE_OTHER): Payer: Self-pay

## 2014-02-28 ENCOUNTER — Ambulatory Visit
Admission: RE | Admit: 2014-02-28 | Discharge: 2014-02-28 | Disposition: A | Discharge status: Home / Self Care | Payer: Medicare Other | Source: Ambulatory Visit

## 2014-02-28 DIAGNOSIS — Z1231 Encounter for screening mammogram for malignant neoplasm of breast: Secondary | ICD-10-CM | POA: Diagnosis not present

## 2014-03-01 ENCOUNTER — Other Ambulatory Visit: Payer: Self-pay | Admitting: Family Medicine

## 2014-03-01 DIAGNOSIS — R928 Other abnormal and inconclusive findings on diagnostic imaging of breast: Secondary | ICD-10-CM

## 2014-03-10 ENCOUNTER — Ambulatory Visit (INDEPENDENT_AMBULATORY_CARE_PROVIDER_SITE_OTHER): Payer: Medicare Other | Admitting: Family Medicine

## 2014-03-10 ENCOUNTER — Encounter: Payer: Self-pay | Admitting: Family Medicine

## 2014-03-10 VITALS — BP 136/65 | HR 67 | Temp 97.9°F | Resp 16 | Ht 66.0 in | Wt 137.0 lb

## 2014-03-10 DIAGNOSIS — E119 Type 2 diabetes mellitus without complications: Secondary | ICD-10-CM | POA: Diagnosis not present

## 2014-03-10 DIAGNOSIS — E785 Hyperlipidemia, unspecified: Secondary | ICD-10-CM

## 2014-03-10 LAB — COMPREHENSIVE METABOLIC PANEL
ALT: 11 U/L (ref 0–35)
AST: 19 U/L (ref 0–37)
Albumin: 3.9 g/dL (ref 3.5–5.2)
Alkaline Phosphatase: 42 U/L (ref 39–117)
BUN: 36 mg/dL — ABNORMAL HIGH (ref 6–23)
CO2: 29 mEq/L (ref 19–32)
Calcium: 10.4 mg/dL (ref 8.4–10.5)
Chloride: 102 mEq/L (ref 96–112)
Creat: 1.37 mg/dL — ABNORMAL HIGH (ref 0.50–1.10)
Glucose, Bld: 147 mg/dL — ABNORMAL HIGH (ref 70–99)
Potassium: 5 mEq/L (ref 3.5–5.3)
Sodium: 138 mEq/L (ref 135–145)
Total Bilirubin: 0.4 mg/dL (ref 0.2–1.2)
Total Protein: 6.8 g/dL (ref 6.0–8.3)

## 2014-03-10 LAB — LIPID PANEL
Cholesterol: 181 mg/dL (ref 0–200)
HDL: 46 mg/dL (ref >39)
LDL Cholesterol: 112 mg/dL — ABNORMAL HIGH (ref 0–99)
Total CHOL/HDL Ratio: 3.9 Ratio
Triglycerides: 113 mg/dL (ref <150)
VLDL: 23 mg/dL (ref 0–40)

## 2014-03-10 LAB — POCT GLYCOSYLATED HEMOGLOBIN (HGB A1C): Hemoglobin A1C: 6.5

## 2014-03-10 NOTE — Progress Notes (Addendum)
Patient ID: [name] MRN: [id number], DOB: Feb 15, 1931, 80 y.o. Date of Encounter: 03/10/2014, 9:57 AM  Primary Physician: [name], MD  Chief Complaint:  Chief Complaint  Patient presents with   Follow-up    4 month     HPI: 80 y.o. year old female with history below presents for her 4 month follow up. Pt states that she has an appointment at [facility] for a follow up on her pancreas. She also states that 5 weeks ago her tooth broke off while she was brushing her teeth. Pt was seen by a dentist who pulled the tooth out. She states that she has an appointment to get the tooth fixed in about 2 weeks time.   Today, pt is denying any bilateral foot swelling or pain.   Pt states that she gets her eyes checked regularly.    Past Medical History  Diagnosis Date   Anemia    Hypertension    GERD (gastroesophageal reflux disease)    Diabetes mellitus    Hyperlipidemia    DVT (deep venous thrombosis) 03/2005    hx.left leg   Glaucoma    Asthma    Esophageal stricture    Tubular adenoma of colon    Diverticulosis    Osteoarthritis    PVD (peripheral vascular disease)    Blood transfusion 05-24-12   Chronic kidney disease     dr Clover Mealy   Blood transfusion without reported diagnosis    Pulmonary nodules 05/07/13   Pancreatic duct dilated 05/07/13   Hiatal hernia 05/07/13     Home Meds: Prior to Admission medications   Medication Sig Start Date End Date Taking? Authorizing Provider  albuterol (PROAIR HFA) 108 (90 BASE) MCG/ACT inhaler Inhale 2 puffs into the lungs every 6 (six) hours as needed for wheezing or shortness of breath. 09/09/13   [name], MD  Ascorbic Acid (VITAMIN C) 1000 MG tablet Take 1,000 mg by mouth daily.    Historical Provider, MD  aspirin 81 MG tablet Take 81 mg by mouth daily.    Historical Provider, MD  brimonidine (ALPHAGAN P) 0.1 % SOLN 1 drop 2 (two) times daily.    Historical Provider, MD  budesonide (PULMICORT)  180 MCG/ACT inhaler Inhale 1 puff into the lungs 2 (two) times daily. 07/02/12   [name], MD  calcium carbonate (OS-CAL) 600 MG TABS Take 600 mg by mouth 2 (two) times daily with a meal.    Historical Provider, MD  dextrose (GLUTOSE) 40 % GEL Take 1 Tube by mouth once as needed.     Historical Provider, MD  famotidine (PEPCID) 20 MG tablet TAKE ONE TABLET AT BEDTIME. 11/12/13   Lafayette Dragon, MD  fenofibrate (TRICOR) 48 MG tablet Take 1 tablet (48 mg total) by mouth daily. 02/08/14   Collene Leyden, PA-C  ferrous fumarate (HEMOCYTE - 106 MG FE) 325 (106 FE) MG TABS Take 1 tablet by mouth every evening.     Historical Provider, MD  metFORMIN (GLUCOPHAGE) 500 MG tablet Take 1 tablet (500 mg total) by mouth daily with supper. 05/06/13   [name], MD  metoprolol succinate (TOPROL-XL) 50 MG 24 hr tablet Take 1 tablet (50 mg total) by mouth daily. Needs office visit.    Ryan M Dunn, PA-C  Multiple Vitamin (MULTIVITAMIN WITH MINERALS) TABS tablet Take 1 tablet by mouth daily.    Historical Provider, MD    Allergies: No Known Allergies  History   Social History   Marital Status: Married  Spouse Name: N/A    Number of Children: N/A   Years of Education: N/A   Occupational History   Not on file.   Social History Main Topics   Smoking status: Never Smoker    Smokeless tobacco: Never Used   Alcohol Use: No   Drug Use: No   Sexual Activity: Not on file   Other Topics Concern   Not on file   Social History Narrative   Born: [place], [place]     Review of Systems: dental problem Constitutional: negative for chills, fever, night sweats, weight changes, or fatigue  HEENT: negative for vision changes, hearing loss, congestion, rhinorrhea, ST, epistaxis, or sinus pressure Cardiovascular: negative for chest pain or palpitations Respiratory: negative for hemoptysis, wheezing, shortness of breath, or cough Abdominal: negative for abdominal pain, nausea, vomiting, diarrhea, or  constipation Dermatological: negative for rash Neurologic: negative for headache, dizziness, or syncope All other systems reviewed and are otherwise negative with the exception to those above and in the HPI.   Physical Exam: PERRL.  Lungs are normal; no wheezing, no rales, no rhonchi. There were no vitals taken for this visit., There is no weight on file to calculate BMI. General: Well developed, well nourished, in no acute distress. Head: Normocephalic, atraumatic, eyes without discharge, sclera non-icteric, nares are without discharge. Bilateral auditory canals clear, TM's are without perforation, pearly grey and translucent with reflective cone of light bilaterally. Oral cavity moist, posterior pharynx without exudate, erythema, peritonsillar abscess, or post nasal drip.  Neck: Supple. No thyromegaly. Full ROM. No lymphadenopathy. Lungs: Clear bilaterally to auscultation without wheezes, rales, or rhonchi. Breathing is unlabored. Heart: RRR with S1 S2. No murmurs, rubs, or gallops appreciated. Abdomen: Soft, non-tender, non-distended with normoactive bowel sounds. No hepatomegaly. No rebound/guarding. No obvious abdominal masses. Msk:  Strength and tone normal for age. Extremities/Skin: Warm and dry. No clubbing or cyanosis. No edema. No rashes or suspicious lesions. Neuro: Alert and oriented X 3. Moves all extremities spontaneously. Gait is normal. CNII-XII grossly in tact. Psych:  Responds to questions appropriately with a normal affect.   Labs: Results for orders placed in visit on 09/09/13  COMPREHENSIVE METABOLIC PANEL      Result Value Ref Range   Sodium 138  135 - 145 mEq/L   Potassium 4.3  3.5 - 5.3 mEq/L   Chloride 101  96 - 112 mEq/L   CO2 28  19 - 32 mEq/L   Glucose, Bld 183 (*) 70 - 99 mg/dL   BUN 34 (*) 6 - 23 mg/dL   Creat 1.40 (*) 0.50 - 1.10 mg/dL   Total Bilirubin 0.4  0.3 - 1.2 mg/dL   Alkaline Phosphatase 37 (*) 39 - 117 U/L   AST 18  0 - 37 U/L   ALT 12  0 - 35  U/L   Total Protein 6.9  6.0 - 8.3 g/dL   Albumin 3.9  3.5 - 5.2 g/dL   Calcium 10.3  8.4 - 10.5 mg/dL  GLUCOSE, POCT (MANUAL RESULT ENTRY)      Result Value Ref Range   POC Glucose 191 (*) 70 - 99 mg/dl  POCT GLYCOSYLATED HEMOGLOBIN (HGB A1C)      Result Value Ref Range   Hemoglobin A1C 6.7     Results for orders placed in visit on 03/10/14  POCT GLYCOSYLATED HEMOGLOBIN (HGB A1C)      Result Value Ref Range   Hemoglobin A1C 6.5        ASSESSMENT AND PLAN:  80 y.o. year old female with controlled diabetes  Recheck in the early fall.  I personally performed the services described in this documentation, which was scribed in my presence. The recorded information has been reviewed and is accurate.  Signed, [name], MD 03/10/2014 9:57 AM

## 2014-03-11 LAB — MICROALBUMIN, URINE: Microalb, Ur: 5.16 mg/dL — ABNORMAL HIGH (ref 0.00–1.89)

## 2014-03-14 ENCOUNTER — Encounter: Payer: Self-pay | Admitting: *Deleted

## 2014-03-15 ENCOUNTER — Ambulatory Visit
Admission: RE | Admit: 2014-03-15 | Discharge: 2014-03-15 | Disposition: A | Discharge status: Home / Self Care | Payer: Medicare Other | Source: Ambulatory Visit | Attending: Family Medicine | Admitting: Family Medicine

## 2014-03-15 DIAGNOSIS — R928 Other abnormal and inconclusive findings on diagnostic imaging of breast: Secondary | ICD-10-CM

## 2014-04-25 DIAGNOSIS — K863 Pseudocyst of pancreas: Secondary | ICD-10-CM | POA: Diagnosis not present

## 2014-04-25 DIAGNOSIS — K8020 Calculus of gallbladder without cholecystitis without obstruction: Secondary | ICD-10-CM | POA: Diagnosis not present

## 2014-04-25 DIAGNOSIS — K862 Cyst of pancreas: Secondary | ICD-10-CM | POA: Diagnosis not present

## 2014-05-05 ENCOUNTER — Other Ambulatory Visit: Payer: Self-pay | Admitting: Physician Assistant

## 2014-05-12 ENCOUNTER — Other Ambulatory Visit: Payer: Self-pay | Admitting: Physician Assistant

## 2014-06-03 ENCOUNTER — Other Ambulatory Visit: Payer: Self-pay | Admitting: Family Medicine

## 2014-06-10 ENCOUNTER — Other Ambulatory Visit: Payer: Self-pay | Admitting: Internal Medicine

## 2014-07-21 ENCOUNTER — Encounter: Payer: Self-pay | Admitting: Family Medicine

## 2014-07-21 ENCOUNTER — Ambulatory Visit (INDEPENDENT_AMBULATORY_CARE_PROVIDER_SITE_OTHER): Payer: Medicare Other | Admitting: Family Medicine

## 2014-07-21 VITALS — BP 124/58 | HR 59 | Temp 97.8°F | Resp 16 | Ht 65.5 in | Wt 134.0 lb

## 2014-07-21 DIAGNOSIS — E119 Type 2 diabetes mellitus without complications: Secondary | ICD-10-CM | POA: Diagnosis not present

## 2014-07-21 DIAGNOSIS — E785 Hyperlipidemia, unspecified: Secondary | ICD-10-CM

## 2014-07-21 DIAGNOSIS — J4521 Mild intermittent asthma with (acute) exacerbation: Secondary | ICD-10-CM | POA: Diagnosis not present

## 2014-07-21 LAB — POCT GLYCOSYLATED HEMOGLOBIN (HGB A1C): Hemoglobin A1C: 6.3

## 2014-07-21 MED ORDER — ALBUTEROL SULFATE (2.5 MG/3ML) 0.083% IN NEBU
2.5000 mg | INHALATION_SOLUTION | Freq: Once | RESPIRATORY_TRACT | Status: AC
  Administered 2014-07-21: 2.5 mg via RESPIRATORY_TRACT

## 2014-07-21 NOTE — Progress Notes (Signed)
   Subjective:    Patient ID: [name], female    DOB: [birth year], 80 y.o.   MRN: [id number]  HPI 80 y.o. patient presents for follow up for DM and dyslipidemia. She has been filling well, but has been wheezing every morning for the past couple of months and reports compliance with pulmicort, but not albuterol. Denies recent illness or fever.   Also has form for renewal of disability placard. Feels unsteady when walking longer distances and uses a cane. Has not had any falls since 2009 when she broke her knee and arm.   Health maintence: Will get flu shot this month through husbands insurance.   Review of Systems  Constitutional: Negative for fever, activity change and fatigue.  HENT: Positive for rhinorrhea. Negative for congestion, ear pain, facial swelling, sneezing and sore throat.   Eyes: Negative for itching.  Respiratory: Positive for wheezing (in the a.m.). Negative for cough, chest tightness and shortness of breath.   Cardiovascular: Negative for chest pain, palpitations and leg swelling.  Gastrointestinal: Negative for nausea, vomiting, abdominal pain, diarrhea and constipation.  Genitourinary: Negative for dysuria and frequency.  Musculoskeletal: Positive for gait problem (unsteady at long distance; requires cane). Negative for joint swelling.  Allergic/Immunologic: Negative for environmental allergies.  Neurological: Negative for weakness, light-headedness and headaches.       Objective:   Physical Exam HEENT:  New dentures, mild injection left eye, otherwise neg Neck:  Supple, no bruits, adenopathy Chest:  Cleared wheezes Heart:  Reg, I/VI systolic murmur Abdomen:  2 cm pulsatile epigastric mass, no HSM, Ext:  Trace edema Skin:  clear Patient was seen with her husband. She is cheerful and friendly as ever.    Assessment & Plan:  Type 2 diabetes mellitus without complication - Plan: [facility] Diabetes Foot Exam, POCT glycosylated hemoglobin (Hb A1C), COMPLETE METABOLIC  PANEL WITH GFR  Dyslipidemia - Plan: Lipid panel, COMPLETE METABOLIC PANEL WITH GFR  Asthma with acute exacerbation, mild intermittent - Plan: albuterol (PROVENTIL) (2.5 MG/3ML) 0.083% nebulizer solution 2.5 mg  Signed, [name], MD

## 2014-07-22 LAB — COMPLETE METABOLIC PANEL WITH GFR
ALK PHOS: 42 U/L (ref 39–117)
ALT: 17 U/L (ref 0–35)
AST: 25 U/L (ref 0–37)
Albumin: 4.4 g/dL (ref 3.5–5.2)
BILIRUBIN TOTAL: 0.5 mg/dL (ref 0.2–1.2)
BUN: 27 mg/dL — ABNORMAL HIGH (ref 6–23)
CO2: 29 mEq/L (ref 19–32)
Calcium: 10.8 mg/dL — ABNORMAL HIGH (ref 8.4–10.5)
Chloride: 101 mEq/L (ref 96–112)
Creat: 1.27 mg/dL — ABNORMAL HIGH (ref 0.50–1.10)
GFR, Est African American: 46 mL/min — ABNORMAL LOW
GFR, Est Non African American: 40 mL/min — ABNORMAL LOW
Glucose, Bld: 85 mg/dL (ref 70–99)
Potassium: 4 mEq/L (ref 3.5–5.3)
Sodium: 137 mEq/L (ref 135–145)
Total Protein: 7 g/dL (ref 6.0–8.3)

## 2014-07-22 LAB — LIPID PANEL
CHOL/HDL RATIO: 3.4 ratio
Cholesterol: 179 mg/dL (ref 0–200)
HDL: 52 mg/dL (ref >39)
LDL CALC: 105 mg/dL — AB (ref 0–99)
TRIGLYCERIDES: 111 mg/dL (ref <150)
VLDL: 22 mg/dL (ref 0–40)

## 2014-07-26 ENCOUNTER — Telehealth: Payer: Self-pay | Admitting: Family Medicine

## 2014-07-26 NOTE — Telephone Encounter (Signed)
Patient would like to know if she could come into the [facility] and get her prevnar 13 vaccine.  Please let us know and place the order if it is ok.  We can call her at [phone] and let her know to come in.  Thank you

## 2014-07-27 NOTE — Addendum Note (Signed)
Addended by: [name] on: 07/27/2014 08:43 AM   Modules accepted: Orders

## 2014-07-27 NOTE — Telephone Encounter (Signed)
Patients husband advised she can come at her convenience and have this done, Dr L. Placed order

## 2014-08-01 DIAGNOSIS — H4011X4 Primary open-angle glaucoma, indeterminate stage: Secondary | ICD-10-CM | POA: Diagnosis not present

## 2014-08-10 ENCOUNTER — Other Ambulatory Visit: Payer: Self-pay | Admitting: Family Medicine

## 2014-08-10 ENCOUNTER — Other Ambulatory Visit: Payer: Self-pay | Admitting: Physician Assistant

## 2014-09-07 ENCOUNTER — Other Ambulatory Visit: Payer: Self-pay | Admitting: Family Medicine

## 2014-09-29 ENCOUNTER — Telehealth: Payer: Self-pay | Admitting: Family Medicine

## 2014-09-29 NOTE — Telephone Encounter (Signed)
Patient will be receiving the flu shot at her place of work.

## 2014-10-06 ENCOUNTER — Ambulatory Visit (INDEPENDENT_AMBULATORY_CARE_PROVIDER_SITE_OTHER): Payer: Medicare Other | Admitting: Radiology

## 2014-10-06 DIAGNOSIS — Z23 Encounter for immunization: Secondary | ICD-10-CM | POA: Diagnosis not present

## 2014-10-19 ENCOUNTER — Other Ambulatory Visit: Payer: Self-pay | Admitting: Family Medicine

## 2014-11-17 ENCOUNTER — Ambulatory Visit: Payer: Medicare Other | Admitting: Family Medicine

## 2014-11-17 ENCOUNTER — Ambulatory Visit (INDEPENDENT_AMBULATORY_CARE_PROVIDER_SITE_OTHER): Payer: Medicare Other | Admitting: Family Medicine

## 2014-11-17 ENCOUNTER — Ambulatory Visit: Payer: Medicare Other

## 2014-11-17 ENCOUNTER — Encounter: Payer: Self-pay | Admitting: Family Medicine

## 2014-11-17 ENCOUNTER — Other Ambulatory Visit: Payer: Self-pay | Admitting: Family Medicine

## 2014-11-17 ENCOUNTER — Ambulatory Visit (INDEPENDENT_AMBULATORY_CARE_PROVIDER_SITE_OTHER): Payer: Medicare Other

## 2014-11-17 VITALS — BP 82/58 | HR 133 | Temp 97.7°F | Resp 16 | Ht 65.5 in | Wt 131.8 lb

## 2014-11-17 DIAGNOSIS — R062 Wheezing: Secondary | ICD-10-CM | POA: Diagnosis not present

## 2014-11-17 DIAGNOSIS — R0600 Dyspnea, unspecified: Secondary | ICD-10-CM

## 2014-11-17 DIAGNOSIS — E119 Type 2 diabetes mellitus without complications: Secondary | ICD-10-CM

## 2014-11-17 DIAGNOSIS — R531 Weakness: Secondary | ICD-10-CM | POA: Diagnosis not present

## 2014-11-17 DIAGNOSIS — J4520 Mild intermittent asthma, uncomplicated: Secondary | ICD-10-CM

## 2014-11-17 DIAGNOSIS — E034 Atrophy of thyroid (acquired): Secondary | ICD-10-CM

## 2014-11-17 LAB — COMPLETE METABOLIC PANEL WITH GFR
ALT: 14 U/L (ref 0–35)
AST: 22 U/L (ref 0–37)
Albumin: 3.8 g/dL (ref 3.5–5.2)
Alkaline Phosphatase: 36 U/L — ABNORMAL LOW (ref 39–117)
BUN: 27 mg/dL — ABNORMAL HIGH (ref 6–23)
CO2: 22 mEq/L (ref 19–32)
Calcium: 10.6 mg/dL — ABNORMAL HIGH (ref 8.4–10.5)
Chloride: 104 mEq/L (ref 96–112)
Creat: 1.36 mg/dL — ABNORMAL HIGH (ref 0.50–1.10)
GFR, Est African American: 42 mL/min — ABNORMAL LOW
GFR, Est Non African American: 37 mL/min — ABNORMAL LOW
Glucose, Bld: 205 mg/dL — ABNORMAL HIGH (ref 70–99)
Potassium: 4 mEq/L (ref 3.5–5.3)
Sodium: 139 mEq/L (ref 135–145)
Total Bilirubin: 0.6 mg/dL (ref 0.2–1.2)
Total Protein: 6.4 g/dL (ref 6.0–8.3)

## 2014-11-17 LAB — POCT GLYCOSYLATED HEMOGLOBIN (HGB A1C): Hemoglobin A1C: 6.2

## 2014-11-17 LAB — T4, FREE: Free T4: 1.19 ng/dL (ref 0.80–1.80)

## 2014-11-17 LAB — SEDIMENTATION RATE: Sed Rate: 4 mm/hr (ref 0–22)

## 2014-11-17 LAB — POCT CBC
Granulocyte percent: 63.3 %G (ref 37–80)
HCT, POC: 42 % (ref 37.7–47.9)
Hemoglobin: 13.6 g/dL (ref 12.2–16.2)
Lymph, poc: 2.4 (ref 0.6–3.4)
MCH, POC: 32 pg — AB (ref 27–31.2)
MCHC: 32.4 g/dL (ref 31.8–35.4)
MCV: 98.7 fL — AB (ref 80–97)
MID (cbc): 0.8 (ref 0–0.9)
MPV: 7.6 fL (ref 0–99.8)
POC Granulocyte: 5.4 (ref 2–6.9)
POC LYMPH PERCENT: 27.8 %L (ref 10–50)
POC MID %: 8.9 %M (ref 0–12)
Platelet Count, POC: 249 10*3/uL (ref 142–424)
RBC: 4.26 M/uL (ref 4.04–5.48)
RDW, POC: 14.5 %
WBC: 8.6 10*3/uL (ref 4.6–10.2)

## 2014-11-17 LAB — TSH: TSH: 5.249 u[IU]/mL — ABNORMAL HIGH (ref 0.350–4.500)

## 2014-11-17 MED ORDER — METFORMIN HCL 500 MG PO TABS: ORAL_TABLET | ORAL | Status: DC

## 2014-11-17 MED ORDER — ALBUTEROL SULFATE HFA 108 (90 BASE) MCG/ACT IN AERS
INHALATION_SPRAY | RESPIRATORY_TRACT | Status: DC
Start: 2014-11-17 — End: 2015-04-27

## 2014-11-17 MED ORDER — LEVOTHYROXINE SODIUM 25 MCG PO TABS: 25.0000 ug | ORAL_TABLET | Freq: Every day | ORAL | Status: DC

## 2014-11-17 MED ORDER — FAMOTIDINE 20 MG PO TABS: ORAL_TABLET | ORAL | Status: DC

## 2014-11-17 MED ORDER — METOPROLOL SUCCINATE ER 50 MG PO TB24: 50.0000 mg | ORAL_TABLET | Freq: Every day | ORAL | Status: DC

## 2014-11-17 MED ORDER — BUDESONIDE 180 MCG/ACT IN AEPB: 1.0000 | INHALATION_SPRAY | Freq: Two times a day (BID) | RESPIRATORY_TRACT | Status: DC

## 2014-11-17 MED ORDER — FENOFIBRATE 48 MG PO TABS: 48.0000 mg | ORAL_TABLET | Freq: Every day | ORAL | Status: DC

## 2014-11-17 MED ORDER — PREDNISONE 20 MG PO TABS: 40.0000 mg | ORAL_TABLET | Freq: Every day | ORAL | Status: DC

## 2014-11-17 MED ORDER — ALBUTEROL SULFATE HFA 108 (90 BASE) MCG/ACT IN AERS: INHALATION_SPRAY | RESPIRATORY_TRACT | Status: DC

## 2014-11-17 NOTE — Patient Instructions (Signed)
I have ordered prednisone to help with the asthma.  Just take 1-2 tablets with breakfast or lunch daily for 5 days.  Other lab tests should be back tomorrow.

## 2014-11-17 NOTE — Progress Notes (Addendum)
Patient ID: [name], female   DOB: November 05, 1930, 81 y.o.   MRN: [id number]  This chart was scribed for [name], MD by Ladene Artist, ED Scribe. The patient was seen in room 25. Patient's care was started at 9:42 AM.  Patient ID: [name] MRN: [id number], DOB: 03/03/1931, 81 y.o. Date of Encounter: 11/17/2014, 10:39 AM  Primary Physician: [name], MD  Chief Complaint  Patient presents with   Fatigue    started this morning    HPI: 81 y.o. year old female with history below presents with persistent weakness since this morning upon waking. Pt states that she has experienced associated shortness with breath with walking, worsening wheezing over the past few weeks, unchanged cough. She states that she took a nebulizer treatment this morning and experiences "shakiness" at this time. She further reports that symptoms are improved with lying. She denies chest pain, abdominal pain, constipation, blood in stools, hematuria, any other urinary symptoms, calf tenderness.   Pt's husband states that pt has been upset since he was hospitalized last month for a-fib. Pt states that she has worried about him more over the past month.   Patient notes anosmia but no loss of appetite  Past Medical History  Diagnosis Date   Anemia    Hypertension    GERD (gastroesophageal reflux disease)    Diabetes mellitus    Hyperlipidemia    DVT (deep venous thrombosis) 03/2005    hx.left leg   Glaucoma    Asthma    Esophageal stricture    Tubular adenoma of colon    Diverticulosis    Osteoarthritis    PVD (peripheral vascular disease)    Blood transfusion 05-24-12   Chronic kidney disease     dr Clover Mealy   Blood transfusion without reported diagnosis    Pulmonary nodules 05/07/13   Pancreatic duct dilated 05/07/13   Hiatal hernia 05/07/13     Home Meds: Prior to Admission medications   Medication Sig Start Date End Date Taking? Authorizing Provider  albuterol (PROAIR  HFA) 108 (90 BASE) MCG/ACT inhaler USE 2 PUFFS EVERY 6 HOURS AS NEEDED FOR SHORTNESS OF BREATH AND WHEEZING 10/21/14   Chelle S Jeffery, PA-C  Ascorbic Acid (VITAMIN C) 1000 MG tablet Take 1,000 mg by mouth daily.    Historical Provider, MD  aspirin 81 MG tablet Take 81 mg by mouth daily.    Historical Provider, MD  brimonidine (ALPHAGAN P) 0.1 % SOLN 1 drop 2 (two) times daily.    Historical Provider, MD  budesonide (PULMICORT) 180 MCG/ACT inhaler Inhale 1 puff into the lungs 2 (two) times daily. 07/02/12   [name], MD  calcium carbonate (OS-CAL) 600 MG TABS Take 600 mg by mouth 2 (two) times daily with a meal.    Historical Provider, MD  dextrose (GLUTOSE) 40 % GEL Take 1 Tube by mouth once as needed.     Historical Provider, MD  famotidine (PEPCID) 20 MG tablet TAKE ONE TABLET AT BEDTIME. 06/13/14   Lafayette Dragon, MD  fenofibrate (TRICOR) 48 MG tablet TAKE 1 TABLET ONCE DAILY. 08/11/14   Mancel Bale, PA-C  ferrous fumarate (HEMOCYTE - 106 MG FE) 325 (106 FE) MG TABS Take 1 tablet by mouth every evening.     Historical Provider, MD  metFORMIN (GLUCOPHAGE) 500 MG tablet TAKE 1 TABLET ONCE DAILY WITH SUPPER. 09/08/14   [name], MD  metoprolol succinate (TOPROL-XL) 50 MG 24 hr tablet TAKE 1 TABLET ONCE DAILY. 08/11/14   Judson Roch  L Weber, PA-C  Multiple Vitamin (MULTIVITAMIN WITH MINERALS) TABS tablet Take 1 tablet by mouth daily.    Historical Provider, MD    Allergies: No Known Allergies  History   Social History   Marital Status: Married    Spouse Name: N/A    Number of Children: N/A   Years of Education: N/A   Occupational History   Not on file.   Social History Main Topics   Smoking status: Never Smoker    Smokeless tobacco: Never Used   Alcohol Use: No   Drug Use: No   Sexual Activity: Not on file   Other Topics Concern   Not on file   Social History Narrative   Born: [place], [place]     Review of Systems: Constitutional: negative for chills, fever,  night sweats, or weight changes, or fatigue HEENT: negative for vision changes, hearing loss, congestion, rhinorrhea, ST, epistaxis, or sinus pressure Cardiovascular: negative for chest pain or palpitations Respiratory: negative for hemoptysis, +wheezing, +shortness of breath, +cough Abdominal: negative for abdominal pain, constipation, blood in stools, nausea, vomiting, diarrhea GU: negative for hematuria Dermatological: negative for rash Neurologic: negative for headache, dizziness, or syncope, +weakness All other systems reviewed and are otherwise negative with the exception to those above and in the HPI.  Physical Exam: Blood pressure 82/58, pulse 120, temperature 97.7 F (36.5 C), temperature source Oral, resp. rate 16, height 5' 5.5" (1.664 m), weight 131 lb 12.8 oz (59.784 kg)., Body mass index is 21.59 kg/(m^2).  General: Well developed, well nourished. Slightly tremulous but in no acute distress. Head: Normocephalic, atraumatic, eyes without discharge, sclera non-icteric, nares are without discharge. Bilateral auditory canals clear, TM's are without perforation, pearly grey and translucent with reflective cone of light bilaterally. Oral cavity moist, posterior pharynx without exudate, erythema, peritonsillar abscess, or post nasal drip.  Neck: Supple. No thyromegaly. No carotid bruit. Full ROM. No lymphadenopathy.  Lungs: Bilateral expiratory wheezes. No rales, or rhonchi. Breathing is unlabored.  Heart: Regular and Rapid at 120. No murmurs, rubs, or gallops appreciated.  Abdomen: Soft, non-tender, non-distended with normoactive bowel sounds. No hepatomegaly. No rebound/guarding. No obvious abdominal masses. Msk:  Strength and tone normal for age. Extremities/Skin: Warm and dry. No clubbing or cyanosis. No edema. No rashes or suspicious lesions. Color is good.  Neuro: Alert and oriented X 3. Moves all extremities spontaneously. Gait is normal. CNII-XII grossly in tact.  Psych:   Responds to questions appropriately with a normal affect.   Labs: Results for orders placed or performed in visit on 11/17/14  POCT CBC  Result Value Ref Range   WBC 8.6 4.6 - 10.2 K/uL   Lymph, poc 2.4 0.6 - 3.4   POC LYMPH PERCENT 27.8 10 - 50 %L   MID (cbc) 0.8 0 - 0.9   POC MID % 8.9 0 - 12 %M   POC Granulocyte 5.4 2 - 6.9   Granulocyte percent 63.3 37 - 80 %G   RBC 4.26 4.04 - 5.48 M/uL   Hemoglobin 13.6 12.2 - 16.2 g/dL   HCT, POC 42.0 37.7 - 47.9 %   MCV 98.7 (A) 80 - 97 fL   MCH, POC 32.0 (A) 27 - 31.2 pg   MCHC 32.4 31.8 - 35.4 g/dL   RDW, POC 14.5 %   Platelet Count, POC 249 142 - 424 K/uL   MPV 7.6 0 - 99.8 fL  POCT glycosylated hemoglobin (Hb A1C)  Result Value Ref Range   Hemoglobin A1C 6.2  EKG:  Tachycardia with left bundle branch pattern similar to that of 05/27/2012 [facility] reading (PRIMARY) by  Dr. Joseph Art: CXR hyperinflation.    ASSESSMENT AND PLAN:  81 y.o. year old female with  Dyspnea - Plan: COMPLETE METABOLIC PANEL WITH GFR, POCT CBC, T4, free, EKG 12-Lead, DG Chest 2 View  Weakness - Plan: Sedimentation rate, COMPLETE METABOLIC PANEL WITH GFR, TSH, POCT CBC, T4, free, EKG 12-Lead, predniSONE (DELTASONE) 20 MG tablet  Wheezing - Plan: POCT CBC, DG Chest 2 View, albuterol (PROAIR HFA) 108 (90 BASE) MCG/ACT inhaler  Asthma, mild intermittent, uncomplicated - Plan: budesonide (PULMICORT) 180 MCG/ACT inhaler, albuterol (PROAIR HFA) 108 (90 BASE) MCG/ACT inhaler  Diabetes mellitus without complication - Plan: POCT glycosylated hemoglobin (Hb A1C)   Other meds refilled  Signed, [name], MD 11/17/2014 10:39 AM

## 2014-11-21 ENCOUNTER — Other Ambulatory Visit: Payer: Self-pay | Admitting: Family Medicine

## 2014-11-24 ENCOUNTER — Telehealth: Payer: Self-pay

## 2014-11-24 NOTE — Telephone Encounter (Signed)
Pt called back about labs. Notified. She has already started the thyroid med

## 2014-12-23 DIAGNOSIS — H4011X2 Primary open-angle glaucoma, moderate stage: Secondary | ICD-10-CM | POA: Diagnosis not present

## 2014-12-25 DIAGNOSIS — H401122 Primary open-angle glaucoma, left eye, moderate stage: Secondary | ICD-10-CM | POA: Insufficient documentation

## 2015-03-16 ENCOUNTER — Ambulatory Visit (INDEPENDENT_AMBULATORY_CARE_PROVIDER_SITE_OTHER): Payer: Medicare Other | Admitting: Family Medicine

## 2015-03-16 ENCOUNTER — Encounter: Payer: Self-pay | Admitting: Family Medicine

## 2015-03-16 VITALS — BP 104/53 | HR 62 | Temp 98.7°F | Resp 16 | Ht 66.0 in | Wt 133.8 lb

## 2015-03-16 DIAGNOSIS — T148 Other injury of unspecified body region: Secondary | ICD-10-CM | POA: Diagnosis not present

## 2015-03-16 DIAGNOSIS — R21 Rash and other nonspecific skin eruption: Secondary | ICD-10-CM | POA: Diagnosis not present

## 2015-03-16 DIAGNOSIS — I10 Essential (primary) hypertension: Secondary | ICD-10-CM | POA: Diagnosis not present

## 2015-03-16 DIAGNOSIS — E119 Type 2 diabetes mellitus without complications: Secondary | ICD-10-CM

## 2015-03-16 DIAGNOSIS — S20469A Insect bite (nonvenomous) of unspecified back wall of thorax, initial encounter: Secondary | ICD-10-CM

## 2015-03-16 DIAGNOSIS — J4520 Mild intermittent asthma, uncomplicated: Secondary | ICD-10-CM | POA: Diagnosis not present

## 2015-03-16 DIAGNOSIS — E039 Hypothyroidism, unspecified: Secondary | ICD-10-CM | POA: Diagnosis not present

## 2015-03-16 DIAGNOSIS — W57XXXA Bitten or stung by nonvenomous insect and other nonvenomous arthropods, initial encounter: Secondary | ICD-10-CM

## 2015-03-16 DIAGNOSIS — J45909 Unspecified asthma, uncomplicated: Secondary | ICD-10-CM | POA: Insufficient documentation

## 2015-03-16 LAB — POCT GLYCOSYLATED HEMOGLOBIN (HGB A1C): Hemoglobin A1C: 6.3

## 2015-03-16 LAB — COMPREHENSIVE METABOLIC PANEL
ALT: 14 U/L (ref 0–35)
AST: 23 U/L (ref 0–37)
Albumin: 4 g/dL (ref 3.5–5.2)
Alkaline Phosphatase: 40 U/L (ref 39–117)
BUN: 25 mg/dL — ABNORMAL HIGH (ref 6–23)
CO2: 28 mEq/L (ref 19–32)
Calcium: 10.6 mg/dL — ABNORMAL HIGH (ref 8.4–10.5)
Chloride: 103 mEq/L (ref 96–112)
Creat: 1.17 mg/dL — ABNORMAL HIGH (ref 0.50–1.10)
Glucose, Bld: 153 mg/dL — ABNORMAL HIGH (ref 70–99)
Potassium: 4.6 mEq/L (ref 3.5–5.3)
Sodium: 139 mEq/L (ref 135–145)
Total Bilirubin: 0.5 mg/dL (ref 0.2–1.2)
Total Protein: 6.6 g/dL (ref 6.0–8.3)

## 2015-03-16 LAB — TSH: TSH: 2.655 u[IU]/mL (ref 0.350–4.500)

## 2015-03-16 NOTE — Progress Notes (Addendum)
° °  Subjective:    Patient ID: [name], female    DOB: 09/05/1931, 81 y.o.   MRN: [id number] This chart was scribed for [name], MD by Zola Button, Medical Scribe. This patient was seen in Room 27 and the patient's care was started at 2:28 PM.   HPI HPI Comments: [name] is a 81 y.o. female with a hx of hypertension, hypothyroidism and asthma who presents to the [facility] for a follow-up. Patient states she feels good overall. She has been working in the yard. She states that she is able to squat. She reports having a garden tick bite on her back 8 weeks ago. It was embedded, but her husband removed it with a sterilized needle. The bite has been itchy, but she has not had any pain to the area. Patient denies fever, diaphoresis, vomiting, rash, abdominal pain, dark stools, and blood in stool. She also denies any falls; however, she did have a fall last night due to carrying a large bag of ice, but she did not get injured. She has been using her inhaler as needed for asthma.  Review of Systems  Constitutional: Negative for fever and diaphoresis.  Gastrointestinal: Negative for vomiting, abdominal pain and blood in stool.  Skin: Negative for rash.       Objective:   Physical Exam CONSTITUTIONAL: Well developed/well nourished HEAD: Normocephalic/atraumatic EYES: EOM/PERRL, normal fundi ENMT: Mucous membranes moist NECK: supple no meningeal signs SPINE: entire spine nontender CV: S1/S2 noted, no murmurs/rubs/gallops noted LUNGS: faint expiratory wheezes bilaterally ABDOMEN: soft, nontender, no rebound or guarding GU: no cva tenderness NEURO: Pt is awake/alert, moves all extremitiesx4 EXTREMITIES: pulses normal, full ROM, 1+ bilateral pedal edema SKIN: warm, color normal, hyperpigmented right scapular area measuring about 2 and half centimeters with central scaliness, no palmar rash PSYCH: no abnormalities of mood noted, very cheerful  Patient is getting  somewhat debilitated. She is able to slowly get up onto the exam table by herself and also get down. She has a negative Romberg but she does have a rather wide-based gait.    Assessment & Plan:   This chart was scribed in my presence and reviewed by me personally.    ICD-9-CM ICD-10-CM   1. Rash and nonspecific skin eruption 782.1 R21 Rocky mtn spotted fvr ab, IgM-blood     B. Burgdorfi Antibodies     Comprehensive metabolic panel  2. Tick bite 919.4 T14.8 B. Burgdorfi Antibodies   E906.4 W57.XXXA Comprehensive metabolic panel  3. Type 2 diabetes mellitus, controlled 250.00 E11.9 Comprehensive metabolic panel     POCT glycosylated hemoglobin (Hb A1C)  4. Essential hypertension 401.9 I10 Comprehensive metabolic panel     CANCELED: COMPLETE METABOLIC PANEL WITH GFR  5. Hypothyroidism, unspecified hypothyroidism type 244.9 E03.9 TSH   The rash on her back appears to be healing very slowly with hyperpigmentation suggestive of some mild chronic inflammation. Without other symptoms, I doubt that she has a tick related illness.  Signed, [name], MD

## 2015-03-17 ENCOUNTER — Telehealth: Payer: Self-pay

## 2015-03-17 LAB — B. BURGDORFI ANTIBODIES: B burgdorferi Ab IgG+IgM: 0.24 {ISR}

## 2015-03-17 LAB — ROCKY MTN SPOTTED FVR AB, IGM-BLOOD: ROCKY MTN SPOTTED FEVER, IGM: 0.08 IV

## 2015-04-27 ENCOUNTER — Other Ambulatory Visit: Payer: Self-pay | Admitting: Family Medicine

## 2015-05-17 DIAGNOSIS — H4011X2 Primary open-angle glaucoma, moderate stage: Secondary | ICD-10-CM | POA: Diagnosis not present

## 2015-05-19 IMAGING — CT CT CHEST W/O CM
3 of 4 series · 16 of 30 positions shown, 18 images · non-contrast
Comparison: 05/07/2013

CLINICAL DATA: Followup pulmonary nodules

EXAM:
CT CHEST WITHOUT CONTRAST
TECHNIQUE: Multidetector CT imaging of the chest was performed following the
standard protocol without IV contrast.

[Series 3: chest w/o · axial · non-contrast · 0.62mm/px · z∈[-316,-96]mm · 5 of 66 slices shown, 7 images]
[im 11/66  mediastinal]
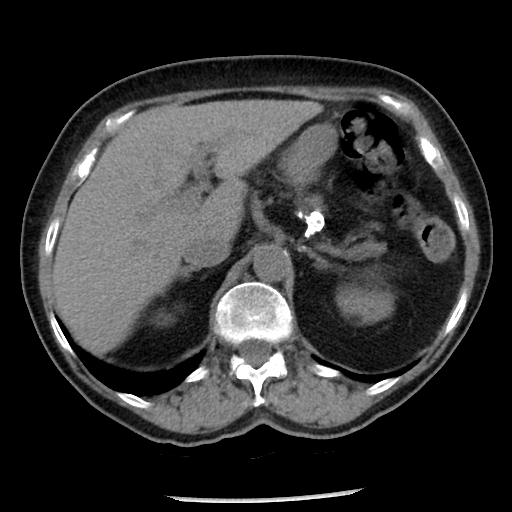
[im 11/66  lung]
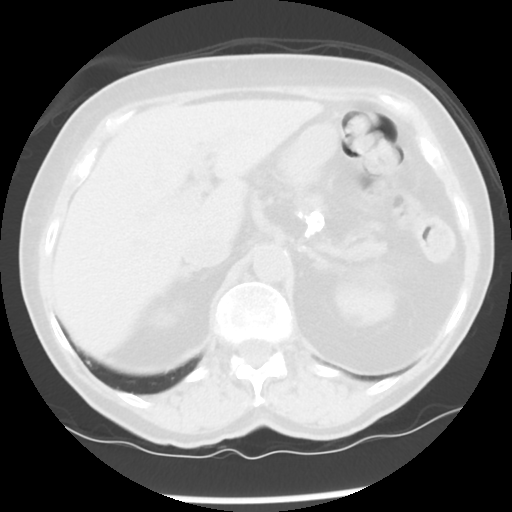
[im 22/66  lung]
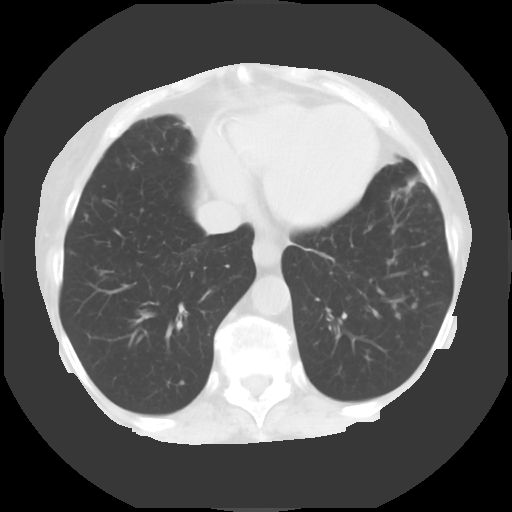
[im 33/66  lung]
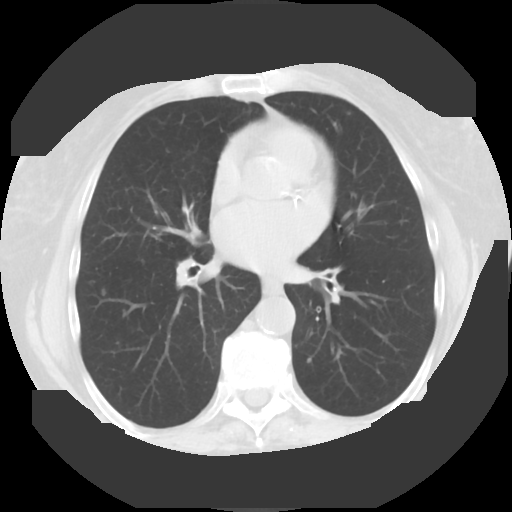
[im 44/66  lung]
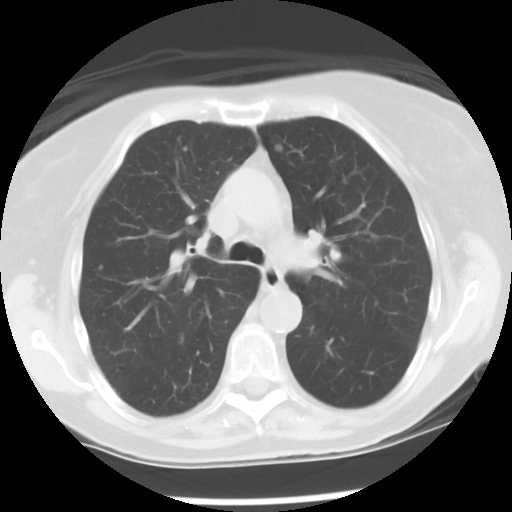
[im 55/66  mediastinal]
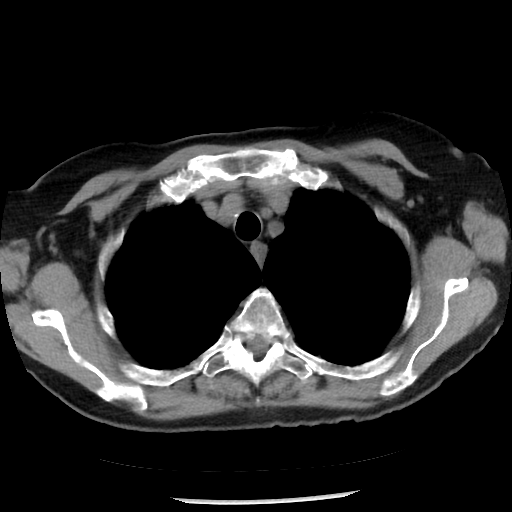
[im 55/66  lung]
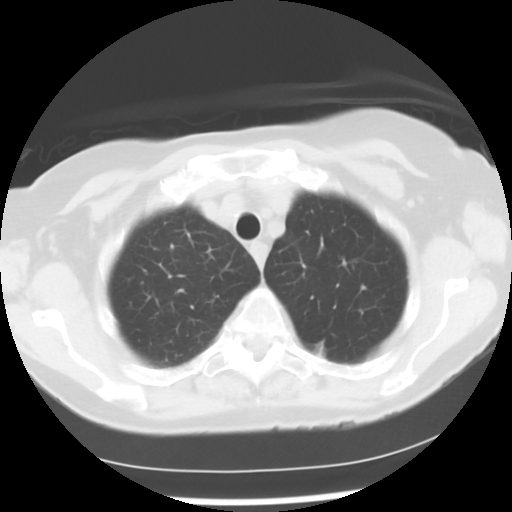

[Series 4: lung windows · axial · 0.62mm/px · z∈[-316,-96]mm · 5 of 66 slices shown]
[im 11/66  lung]
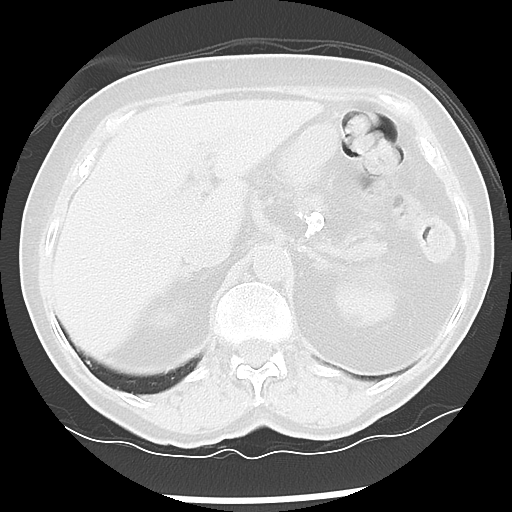
[im 22/66  lung]
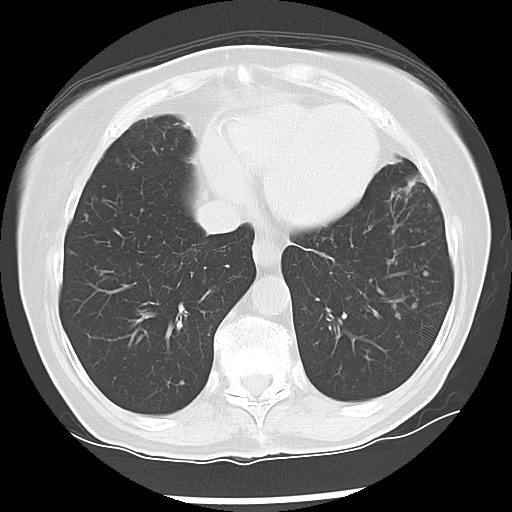
[im 33/66  lung]
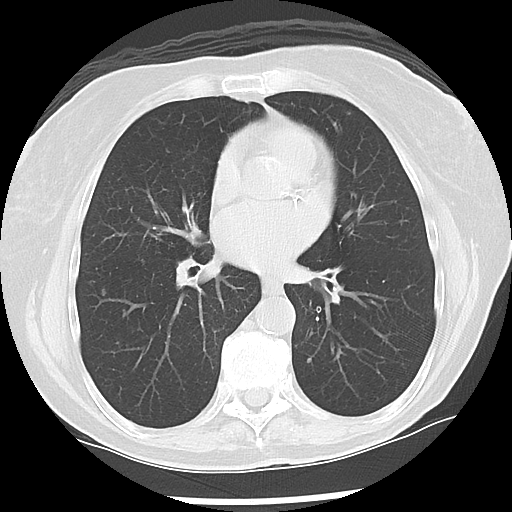
[im 44/66  lung]
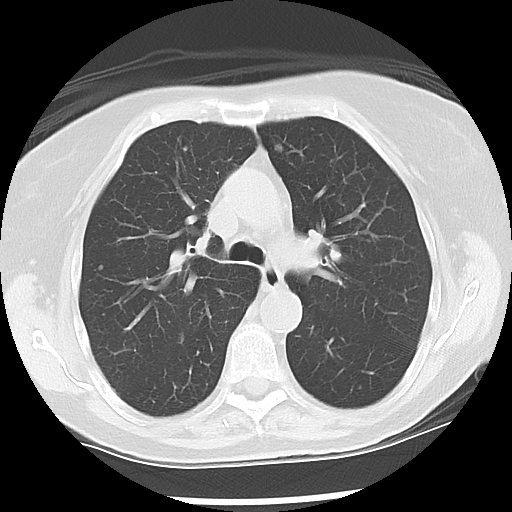
[im 55/66  lung]
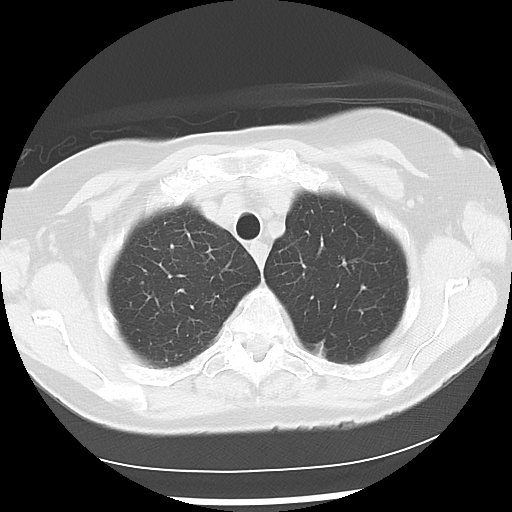

[Series 602: sagittal body · sagittal · 0.64mm/px · 6 of 129 slices shown]
[im 10/129  mediastinal]
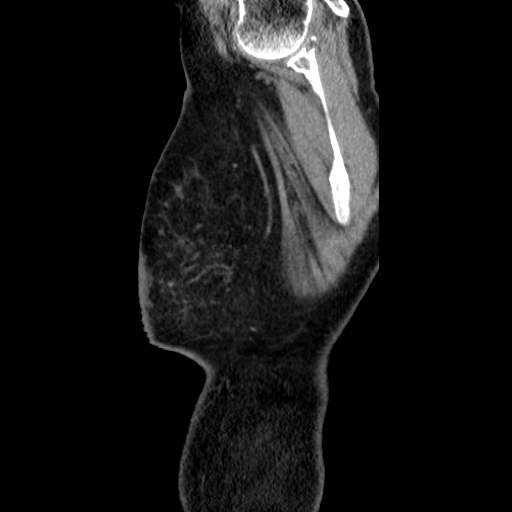
[im 30/129  mediastinal]
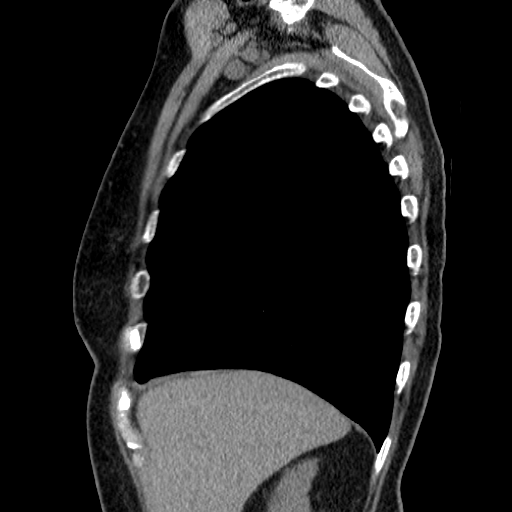
[im 40/129  mediastinal]
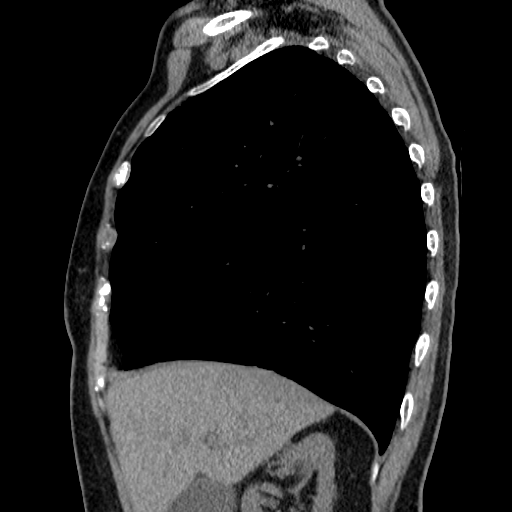
[im 60/129  mediastinal]
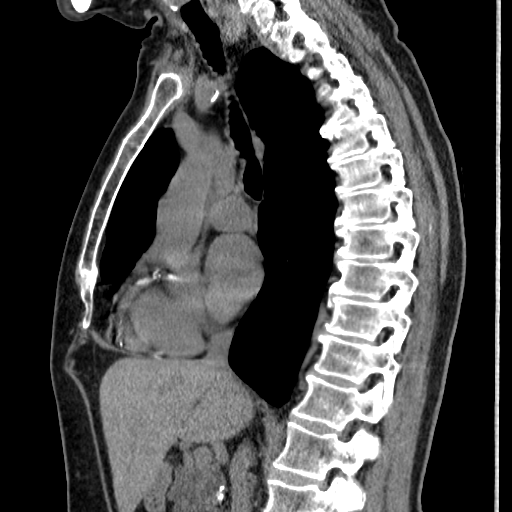
[im 69/129  mediastinal]
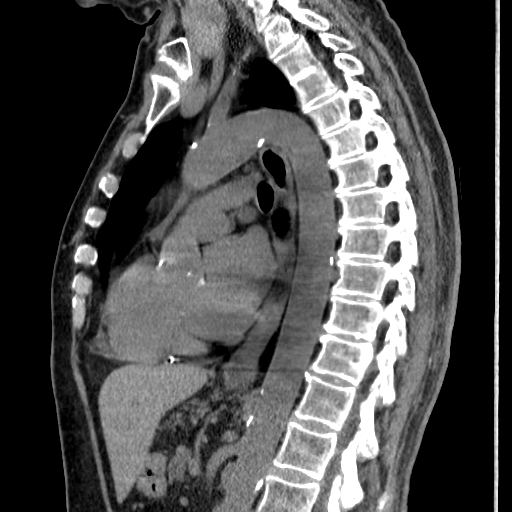
[im 89/129  mediastinal]
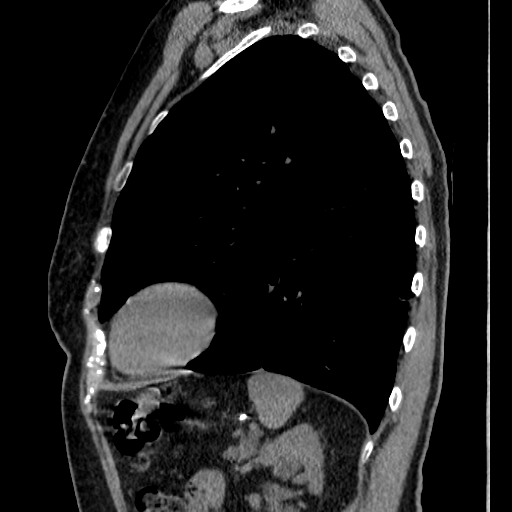

[16 of 30 positions shown; findings below may reference images not displayed]

FINDINGS: No pleural effusion identified. No airspace consolidation noted.
Bilateral pulmonary nodules are again identified. Some of these
nodules have a nonspecific appearance. Others are peripheral and
have a tree-in-bud configuration which suggest sequelae of
inflammation or infection. Left upper lobe pulmonary nodule measures
6 mm, image 22/series 4. This is compared with 7 mm previously.
Dumbbell-shaped nodule within the right upper lobe measures 0.6 x
1.1 cm, image 32/2. Previously this measured 0.7 x 1.2 cm. The index
nodule in the left lung base measures 5 mm, image 47/series 4 and is
unchanged from previous exam. The right lower lobe index nodule is
stable measuring 5 mm, image 50/series 4. Mild bronchiectasis noted
within the right middle lobe and left base. Chronic appearing
bronchial wall thickening and mild bronchiectasis is identified as
before.

The trachea appears patent and is midline. The heart size appears
normal. No pericardial effusion. There is no enlarged mediastinal or
hilar lymph nodes. Calcified atherosclerotic disease affects the
thoracic aorta and the left main, LAD, left circumflex and RCA
coronary arteries. There is no supraclavicular adenopathy or
axillary adenopathy.

Incidental imaging through the upper abdomen shows a calcified
splenic artery aneurysm which measures 1.2 cm, image 57/series 3.
Calcified lymph nodes are identified within the upper abdomen
compatible with prior granulomatous disease.

Review of the visualized osseous structures is significant for mild
spondylosis of the thoracic spine. No aggressive lytic or sclerotic
bone lesion.
IMPRESSION: 1. No acute cardiopulmonary abnormalities.
2. Stable appearance of bilateral pulmonary nodules. The appearance
of these nodules favor sequelae of chronic atypical infection.
3. Atherosclerotic disease including multi vessel coronary artery
calcifications.

## 2015-05-31 ENCOUNTER — Other Ambulatory Visit: Payer: Self-pay | Admitting: Physician Assistant

## 2015-06-11 ENCOUNTER — Other Ambulatory Visit: Payer: Self-pay | Admitting: Family Medicine

## 2015-06-28 ENCOUNTER — Ambulatory Visit: Payer: Self-pay | Admitting: Family Medicine

## 2015-06-29 ENCOUNTER — Other Ambulatory Visit: Payer: Self-pay | Admitting: Family Medicine

## 2015-06-30 ENCOUNTER — Encounter: Payer: Self-pay | Admitting: Family Medicine

## 2015-06-30 ENCOUNTER — Ambulatory Visit (INDEPENDENT_AMBULATORY_CARE_PROVIDER_SITE_OTHER): Payer: Medicare Other | Admitting: Family Medicine

## 2015-06-30 VITALS — BP 148/64 | HR 64 | Temp 98.4°F | Resp 16 | Wt 128.8 lb

## 2015-06-30 DIAGNOSIS — E119 Type 2 diabetes mellitus without complications: Secondary | ICD-10-CM | POA: Diagnosis not present

## 2015-06-30 DIAGNOSIS — I872 Venous insufficiency (chronic) (peripheral): Secondary | ICD-10-CM | POA: Diagnosis not present

## 2015-06-30 DIAGNOSIS — D509 Iron deficiency anemia, unspecified: Secondary | ICD-10-CM

## 2015-06-30 DIAGNOSIS — E034 Atrophy of thyroid (acquired): Secondary | ICD-10-CM | POA: Diagnosis not present

## 2015-06-30 DIAGNOSIS — K862 Cyst of pancreas: Secondary | ICD-10-CM | POA: Diagnosis not present

## 2015-06-30 DIAGNOSIS — J4530 Mild persistent asthma, uncomplicated: Secondary | ICD-10-CM | POA: Diagnosis not present

## 2015-06-30 DIAGNOSIS — K219 Gastro-esophageal reflux disease without esophagitis: Secondary | ICD-10-CM | POA: Diagnosis not present

## 2015-06-30 DIAGNOSIS — I70219 Atherosclerosis of native arteries of extremities with intermittent claudication, unspecified extremity: Secondary | ICD-10-CM

## 2015-06-30 DIAGNOSIS — J4520 Mild intermittent asthma, uncomplicated: Secondary | ICD-10-CM | POA: Diagnosis not present

## 2015-06-30 DIAGNOSIS — E038 Other specified hypothyroidism: Secondary | ICD-10-CM | POA: Diagnosis not present

## 2015-06-30 DIAGNOSIS — E781 Pure hyperglyceridemia: Secondary | ICD-10-CM

## 2015-06-30 DIAGNOSIS — I10 Essential (primary) hypertension: Secondary | ICD-10-CM

## 2015-06-30 LAB — CBC WITH DIFFERENTIAL/PLATELET
BASOS ABS: 0.1 10*3/uL (ref 0.0–0.1)
Basophils Relative: 1 % (ref 0–1)
EOS PCT: 4 % (ref 0–5)
Eosinophils Absolute: 0.3 10*3/uL (ref 0.0–0.7)
HCT: 38.5 % (ref 36.0–46.0)
HEMOGLOBIN: 12.9 g/dL (ref 12.0–15.0)
LYMPHS ABS: 1.6 10*3/uL (ref 0.7–4.0)
Lymphocytes Relative: 20 % (ref 12–46)
MCH: 31.9 pg (ref 26.0–34.0)
MCHC: 33.5 g/dL (ref 30.0–36.0)
MCV: 95.1 fL (ref 78.0–100.0)
MONO ABS: 0.6 10*3/uL (ref 0.1–1.0)
MPV: 9.3 fL (ref 8.6–12.4)
Monocytes Relative: 8 % (ref 3–12)
NEUTROS ABS: 5.2 10*3/uL (ref 1.7–7.7)
Neutrophils Relative %: 67 % (ref 43–77)
Platelets: 281 10*3/uL (ref 150–400)
RBC: 4.05 MIL/uL (ref 3.87–5.11)
RDW: 13.5 % (ref 11.5–15.5)
WBC: 7.8 10*3/uL (ref 4.0–10.5)

## 2015-06-30 LAB — COMPREHENSIVE METABOLIC PANEL
ALT: 15 U/L (ref 6–29)
AST: 24 U/L (ref 10–35)
Albumin: 3.7 g/dL (ref 3.6–5.1)
Alkaline Phosphatase: 36 U/L (ref 33–130)
BILIRUBIN TOTAL: 0.5 mg/dL (ref 0.2–1.2)
BUN: 27 mg/dL — ABNORMAL HIGH (ref 7–25)
CALCIUM: 10 mg/dL (ref 8.6–10.4)
CO2: 27 mmol/L (ref 20–31)
Chloride: 104 mmol/L (ref 98–110)
Creat: 1.3 mg/dL — ABNORMAL HIGH (ref 0.60–0.88)
GLUCOSE: 131 mg/dL — AB (ref 65–99)
POTASSIUM: 4.5 mmol/L (ref 3.5–5.3)
Sodium: 140 mmol/L (ref 135–146)
Total Protein: 6.4 g/dL (ref 6.1–8.1)

## 2015-06-30 LAB — LIPID PANEL
CHOLESTEROL: 151 mg/dL (ref 125–200)
HDL: 53 mg/dL (ref >=46)
LDL Cholesterol: 84 mg/dL (ref <130)
TRIGLYCERIDES: 69 mg/dL (ref <150)
Total CHOL/HDL Ratio: 2.8 Ratio (ref <=5.0)
VLDL: 14 mg/dL (ref <30)

## 2015-06-30 LAB — POCT URINALYSIS DIPSTICK
Bilirubin, UA: NEGATIVE
Blood, UA: NEGATIVE
GLUCOSE UA: NEGATIVE
Ketones, UA: NEGATIVE
NITRITE UA: POSITIVE
PROTEIN UA: NEGATIVE
Spec Grav, UA: 1.02
UROBILINOGEN UA: 0.2
pH, UA: 5.5

## 2015-06-30 LAB — HEMOGLOBIN A1C
HEMOGLOBIN A1C: 6.6 % — AB (ref <5.7)
MEAN PLASMA GLUCOSE: 143 mg/dL — AB (ref <117)

## 2015-06-30 NOTE — Progress Notes (Signed)
Subjective:    Patient ID: [name], female    DOB: [birth year], 81 y.o.   MRN: [id number]  06/30/2015  establish care; Diabetes; Hyperlipidemia; Hypertension; Asthma; Hypothyroidism; and Gastrophageal Reflux   HPI This 81 y.o. female presents with husband for four month follow-up and to establish with a new provider.  Previous patient of Dr. Joseph Art.  1.  DMII: Patient reports good compliance with medication, good tolerance to medication, and good symptom control.   Checks sugars regularly and running 100.  Metformin 500mg  with supper once daily.  2.  Hyperlipidemia: Patient reports good compliance with medication, good tolerance to medication, and good symptom control.  [place]or.  3.  Hypothyroidism:  Patient reports good compliance with medication, good tolerance to medication, and good symptom control.    4.  Asthma: using Albuterol "all the time"; uses it 3-4 times per day chronically; no regular use of Pulmicort; this has been prescribed by Dr. Joseph Art but never started.  Denies chronic cough; has rx for [facility] for intermittent cough; has not used any in months.  5. Iron deficiency anemia: maintained on iron supplement; was losing blood one year ago and underwent extensive work up at that time; no source of bleeding identified; H/H stable on iron supplement recently.  6. GERD: takes famotidine 20mg  at bedtime with good control of symptoms.  7. HTN: Patient reports good compliance with medication, good tolerance to medication, and good symptom control.   Metoprolol XL 50mg  daily.  Last physical:  Not sure Pap smear:  Years ago Mammogram:  2015 Colonoscopy:  2013 Bone density: not sure TDAP:  2005 Pneumovax:  2006 Pneumovax; 2015 Prevnar 13 Zostavax:  2009 Influenza:  Will receive next month. Eye exam:  Every year; [facility]; [place].  Glaucoma. Dental exam:   Pancreas cyst: referred to [facility] 04/2014; was recommended to follow-up in one year but has not  received an appointment. Dr. Olevia Perches made initial referral.  Needs referral back.  Continues to slowly lose weight; husband is very worried about weight loss.   09/02/2013 [facility] GI note:  CT chest noted calcification of pancreas.  Pt then underwent EUS which again noted calcifications and thought to have PD stone.  MRI with dilated side branches and atrophy. CA19-9 WNL in 2014.  Dr. Sharyn Creamer.  MRI abdomen on 04/25/14 revealed numerous pancraetic cysts replacing the normal parenchyma.  Repeat MRI ordered for 04/21/2015 per [facility].  Review of Systems  Constitutional: Positive for unexpected weight change. Negative for fever, chills, diaphoresis and fatigue.  HENT: Negative for congestion, ear pain, postnasal drip, rhinorrhea and sore throat.   Eyes: Negative for visual disturbance.  Respiratory: Negative for cough and shortness of breath.   Cardiovascular: Negative for chest pain, palpitations and leg swelling.  Gastrointestinal: Negative for nausea, vomiting, abdominal pain, diarrhea and constipation.  Endocrine: Negative for cold intolerance, heat intolerance, polydipsia, polyphagia and polyuria.  Skin: Negative for color change, pallor, rash and wound.  Neurological: Negative for dizziness, tremors, seizures, syncope, facial asymmetry, speech difficulty, weakness, light-headedness, numbness and headaches.  Psychiatric/Behavioral: Negative for suicidal ideas, sleep disturbance, self-injury, dysphoric mood and decreased concentration. The patient is not nervous/anxious.     Past Medical History  Diagnosis Date  . Anemia   . Hypertension   . GERD (gastroesophageal reflux disease)   . Diabetes mellitus   . Hyperlipidemia   . DVT (deep venous thrombosis) 03/2005    hx.left leg  . Glaucoma   . Asthma   .  Esophageal stricture   . Tubular adenoma of colon   . Diverticulosis   . Osteoarthritis   . PVD (peripheral vascular disease)   . Blood transfusion 05-24-12  . Chronic kidney disease      dr Clover Mealy  . Blood transfusion without reported diagnosis   . Pulmonary nodules 05/07/13  . Pancreatic duct dilated 05/07/13  . Hiatal hernia 05/07/13   Past Surgical History  Procedure Laterality Date  . Wrist fracture surgery      R wrist fracture, plate  . Esophagogastroduodenoscopy  05/26/2012    Procedure: ESOPHAGOGASTRODUODENOSCOPY (EGD);  Surgeon: Lafayette Dragon, MD;  Location: [facility];  Service: Endoscopy;  Laterality: N/A;  . Colonoscopy  05/27/2012    Procedure: COLONOSCOPY;  Surgeon: Lafayette Dragon, MD;  Location: [facility];  Service: Endoscopy;  Laterality: N/A;  . Eye surgery    . Cataract extraction, bilateral    . Vascular surgery  09/2004  . Eus N/A 07/08/2013    Procedure: UPPER ENDOSCOPIC ULTRASOUND (EUS) LINEAR;  Surgeon: Milus Banister, MD;  Location: [facility]OSCOPY;  Service: Endoscopy;  Laterality: N/A;  need side view scope   Not on File Current Outpatient Prescriptions  Medication Sig Dispense Refill  . Ascorbic Acid (VITAMIN C) 1000 MG tablet Take 1,000 mg by mouth daily.    Marland Kitchen aspirin 81 MG tablet Take 81 mg by mouth daily.    . brimonidine (ALPHAGAN P) 0.1 % SOLN 1 drop 2 (two) times daily.    . budesonide (PULMICORT) 180 MCG/ACT inhaler Inhale 1 puff into the lungs 2 (two) times daily. 1 Inhaler 3  . calcium carbonate (OS-CAL) 600 MG TABS Take 600 mg by mouth 2 (two) times daily with a meal.    . dextrose (GLUTOSE) 40 % GEL Take 1 Tube by mouth once as needed.     . famotidine (PEPCID) 20 MG tablet TAKE ONE TABLET AT BEDTIME. 90 tablet 1  . fenofibrate (TRICOR) 48 MG tablet Take 1 tablet (48 mg total) by mouth daily. 90 tablet 1  . ferrous fumarate (HEMOCYTE - 106 MG FE) 325 (106 FE) MG TABS Take 1 tablet by mouth every evening.     Marland Kitchen levothyroxine (SYNTHROID, LEVOTHROID) 25 MCG tablet TAKE 1 TABLET DAILY BEFORE BREAKFAST 90 tablet 0  . metFORMIN (GLUCOPHAGE) 500 MG tablet TAKE 1 TABLET ONCE DAILY WITH SUPPER. 90 tablet 2  . metoprolol succinate  (TOPROL-XL) 50 MG 24 hr tablet Take 1 tablet (50 mg total) by mouth daily. Take with or immediately following a meal. 90 tablet 1  . Multiple Vitamin (MULTIVITAMIN WITH MINERALS) TABS tablet Take 1 tablet by mouth daily.    Marland Kitchen PROAIR HFA 108 (90 BASE) MCG/ACT inhaler USE 2 PUFFS EVERY 6 HOURS AS NEEDED FOR SHORTNESS OF BREATH. 8.5 g 0   No current facility-administered medications for this visit.   Social History   Social History  . Marital Status: Married    Spouse Name: N/A  . Number of Children: N/A  . Years of Education: N/A   Occupational History  . Not on file.   Social History Main Topics  . Smoking status: Never Smoker   . Smokeless tobacco: Never Used  . Alcohol Use: No  . Drug Use: No  . Sexual Activity: Not on file   Other Topics Concern  . Not on file   Social History Narrative   Marital status: married x 60 years      Children:  4 children; 11 grandchildren; 2 gg  Lives: with husband, dog      Employment: Retired      Tobacco: never      Alcohol: none      Exercise:  Walking daily short distances; previous tennis player      ADLs:  Walks with cane; cleans and cooks; drives.     Born: [place], [place]   Family History  Problem Relation Age of Onset  . Adopted: Yes  . Colon cancer Neg Hx   . Migraines Daughter   . Cancer Son        Objective:    BP 148/64 mmHg  Pulse 64  Temp(Src) 98.4 F (36.9 C) (Oral)  Resp 16  Wt 128 lb 12.8 oz (58.423 kg) Physical Exam  Constitutional: She is oriented to person, place, and time. She appears well-developed and well-nourished. No distress.  HENT:  Head: Normocephalic and atraumatic.  Right Ear: External ear normal.  Left Ear: External ear normal.  Nose: Nose normal.  Mouth/Throat: Oropharynx is clear and moist.  Eyes: Conjunctivae and EOM are normal. Pupils are equal, round, and reactive to light.  Neck: Normal range of motion. Neck supple. Carotid bruit is not present. No thyromegaly present.    Cardiovascular: Normal rate, regular rhythm, normal heart sounds and intact distal pulses.  Exam reveals no gallop and no friction rub.   No murmur heard. Pulmonary/Chest: Effort normal and breath sounds normal. She has no wheezes. She has no rales.  Abdominal: Soft. Bowel sounds are normal. She exhibits no distension and no mass. There is no tenderness. There is no rebound and no guarding.  Lymphadenopathy:    She has no cervical adenopathy.  Neurological: She is alert and oriented to person, place, and time. No cranial nerve deficit.  Skin: Skin is warm and dry. No rash noted. She is not diaphoretic. No erythema. No pallor.  Psychiatric: She has a normal mood and affect. Her behavior is normal.        Assessment & Plan:   1. Pancreas cyst   2. Venous (peripheral) insufficiency   3. Atherosclerosis of native arteries of extremity with intermittent claudication   4. Asthma, mild persistent, uncomplicated   5. Type 2 diabetes mellitus without complication   6. Hypertriglyceridemia     1. Pancreatic cyst: stable; refer back to [facility] for one year follow-up. Due for repeat MRI of pancreas. 2.  Venous insufficiency: stable; continue supportive care with rest, elevation, compression stockings. 3.  Asthma mild persistent; uncontrolled; rx for Pulmicort provided to use daily.   4.  DMII: controlled; obtain labs; continue current medication. 5.  Hypertriglyceridemia: controlled; obtain labs; continue current medication. 6. GERD: controlled; continue Pepcid. 7.  HTN: controlled; obtain labs; continue current medications. 8. Hypothyroidism: stable; obtain labs; continue current medications. 9. Weight loss unintentional: weight down 3 pounds in past four months; continue to monitor.    Orders Placed This Encounter  Procedures  . CBC with Differential/Platelet  . Comprehensive metabolic panel    Order Specific Question:  Has the patient fasted?    Answer:  Yes  . Hemoglobin A1c  . Lipid  panel    Order Specific Question:  Has the patient fasted?    Answer:  Yes  . Microalbumin, urine  . Ambulatory referral to Gastroenterology    Referral Priority:  Routine    Referral Type:  Consultation    Referral Reason:  Specialty Services Required    Requested Specialty:  Gastroenterology    Number of Visits Requested:  1  . POCT urinalysis dipstick   No orders of the defined types were placed in this encounter.    Return in about 4 months (around 10/30/2015) for recheck DIABETES, HIGH BLOOD PRESSURE, HIGH CHOLESTEROL.    [name] Elayne Guerin, M.D. [facility] [address] [place], [place]  [address] [phone] phone [phone] fax

## 2015-07-01 LAB — MICROALBUMIN, URINE: MICROALB UR: 4.5 mg/dL — AB (ref <2.0)

## 2015-07-02 DIAGNOSIS — K862 Cyst of pancreas: Secondary | ICD-10-CM | POA: Insufficient documentation

## 2015-07-02 MED ORDER — BECLOMETHASONE DIPROPIONATE 40 MCG/ACT IN AERS: 2.0000 | INHALATION_SPRAY | Freq: Two times a day (BID) | RESPIRATORY_TRACT | Status: DC

## 2015-07-20 ENCOUNTER — Other Ambulatory Visit: Payer: Self-pay | Admitting: Family Medicine

## 2015-08-31 ENCOUNTER — Other Ambulatory Visit: Payer: Self-pay | Admitting: Family Medicine

## 2015-09-05 ENCOUNTER — Other Ambulatory Visit: Payer: Self-pay | Admitting: Family Medicine

## 2015-09-10 ENCOUNTER — Other Ambulatory Visit: Payer: Self-pay | Admitting: Family Medicine

## 2015-10-06 ENCOUNTER — Other Ambulatory Visit: Payer: Self-pay | Admitting: Family Medicine

## 2015-10-12 ENCOUNTER — Other Ambulatory Visit: Payer: Self-pay | Admitting: Family Medicine

## 2015-10-27 ENCOUNTER — Encounter: Payer: Self-pay | Admitting: Family Medicine

## 2015-10-27 ENCOUNTER — Ambulatory Visit (INDEPENDENT_AMBULATORY_CARE_PROVIDER_SITE_OTHER): Payer: Medicare Other | Admitting: Family Medicine

## 2015-10-27 VITALS — BP 93/62 | HR 118 | Temp 97.4°F | Resp 18 | Wt 125.2 lb

## 2015-10-27 DIAGNOSIS — E785 Hyperlipidemia, unspecified: Secondary | ICD-10-CM | POA: Diagnosis not present

## 2015-10-27 DIAGNOSIS — E038 Other specified hypothyroidism: Secondary | ICD-10-CM

## 2015-10-27 DIAGNOSIS — I10 Essential (primary) hypertension: Secondary | ICD-10-CM

## 2015-10-27 DIAGNOSIS — R413 Other amnesia: Secondary | ICD-10-CM | POA: Diagnosis not present

## 2015-10-27 DIAGNOSIS — E1121 Type 2 diabetes mellitus with diabetic nephropathy: Secondary | ICD-10-CM

## 2015-10-27 DIAGNOSIS — E034 Atrophy of thyroid (acquired): Secondary | ICD-10-CM | POA: Diagnosis not present

## 2015-10-27 DIAGNOSIS — E1129 Type 2 diabetes mellitus with other diabetic kidney complication: Secondary | ICD-10-CM | POA: Diagnosis not present

## 2015-10-27 DIAGNOSIS — Z6820 Body mass index (BMI) 20.0-20.9, adult: Secondary | ICD-10-CM

## 2015-10-27 DIAGNOSIS — K862 Cyst of pancreas: Secondary | ICD-10-CM | POA: Diagnosis not present

## 2015-10-27 LAB — COMPREHENSIVE METABOLIC PANEL
ALT: 15 U/L (ref 6–29)
AST: 25 U/L (ref 10–35)
Albumin: 4.2 g/dL (ref 3.6–5.1)
Alkaline Phosphatase: 37 U/L (ref 33–130)
BILIRUBIN TOTAL: 0.7 mg/dL (ref 0.2–1.2)
BUN: 29 mg/dL — AB (ref 7–25)
CALCIUM: 10.1 mg/dL (ref 8.6–10.4)
CO2: 27 mmol/L (ref 20–31)
Chloride: 102 mmol/L (ref 98–110)
Creat: 1.34 mg/dL — ABNORMAL HIGH (ref 0.60–0.88)
GLUCOSE: 169 mg/dL — AB (ref 65–99)
Potassium: 4.2 mmol/L (ref 3.5–5.3)
Sodium: 139 mmol/L (ref 135–146)
Total Protein: 6.9 g/dL (ref 6.1–8.1)

## 2015-10-27 LAB — TSH: TSH: 6.137 u[IU]/mL — AB (ref 0.350–4.500)

## 2015-10-27 LAB — CBC WITH DIFFERENTIAL/PLATELET
BASOS ABS: 0 10*3/uL (ref 0.0–0.1)
BASOS PCT: 0 % (ref 0–1)
EOS ABS: 0.3 10*3/uL (ref 0.0–0.7)
EOS PCT: 4 % (ref 0–5)
HCT: 41.8 % (ref 36.0–46.0)
Hemoglobin: 13.7 g/dL (ref 12.0–15.0)
Lymphocytes Relative: 24 % (ref 12–46)
Lymphs Abs: 1.6 10*3/uL (ref 0.7–4.0)
MCH: 31.1 pg (ref 26.0–34.0)
MCHC: 32.8 g/dL (ref 30.0–36.0)
MCV: 95 fL (ref 78.0–100.0)
MONO ABS: 0.5 10*3/uL (ref 0.1–1.0)
MPV: 9.7 fL (ref 8.6–12.4)
Monocytes Relative: 7 % (ref 3–12)
Neutro Abs: 4.2 10*3/uL (ref 1.7–7.7)
Neutrophils Relative %: 65 % (ref 43–77)
PLATELETS: 273 10*3/uL (ref 150–400)
RBC: 4.4 MIL/uL (ref 3.87–5.11)
RDW: 13.4 % (ref 11.5–15.5)
WBC: 6.5 10*3/uL (ref 4.0–10.5)

## 2015-10-27 LAB — HEMOGLOBIN A1C
HEMOGLOBIN A1C: 6.7 % — AB (ref <5.7)
MEAN PLASMA GLUCOSE: 146 mg/dL — AB (ref <117)

## 2015-10-27 LAB — LIPID PANEL
CHOLESTEROL: 177 mg/dL (ref 125–200)
HDL: 63 mg/dL (ref >=46)
LDL Cholesterol: 101 mg/dL (ref <130)
Total CHOL/HDL Ratio: 2.8 Ratio (ref <=5.0)
Triglycerides: 67 mg/dL (ref <150)
VLDL: 13 mg/dL (ref <30)

## 2015-10-27 LAB — RPR

## 2015-10-27 LAB — T4, FREE: FREE T4: 1.07 ng/dL (ref 0.80–1.80)
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
## 2015-11-08 ENCOUNTER — Ambulatory Visit
Admission: RE | Admit: 2015-11-08 | Discharge: 2015-11-08 | Disposition: A | Discharge status: Home / Self Care | Payer: Medicare Other | Source: Ambulatory Visit | Attending: Family Medicine | Admitting: Family Medicine

## 2015-11-08 DIAGNOSIS — R413 Other amnesia: Secondary | ICD-10-CM

## 2015-11-08 MED ORDER — GADOBENATE DIMEGLUMINE 529 MG/ML IV SOLN
5.0000 mL | Freq: Once | INTRAVENOUS | Status: AC | PRN
  Administered 2015-11-08: 5 mL via INTRAVENOUS

## 2015-11-11 ENCOUNTER — Other Ambulatory Visit: Payer: Self-pay | Admitting: Family Medicine

## 2015-11-14 ENCOUNTER — Encounter: Payer: Self-pay | Admitting: Family Medicine

## 2015-11-16 ENCOUNTER — Encounter: Payer: Self-pay | Admitting: *Deleted

## 2015-11-17 ENCOUNTER — Ambulatory Visit (INDEPENDENT_AMBULATORY_CARE_PROVIDER_SITE_OTHER): Payer: Medicare Other | Admitting: Family Medicine

## 2015-11-17 ENCOUNTER — Encounter: Payer: Self-pay | Admitting: Family Medicine

## 2015-11-17 VITALS — BP 115/74 | HR 118 | Temp 98.2°F | Resp 18 | Ht 66.0 in | Wt 124.0 lb

## 2015-11-17 DIAGNOSIS — I10 Essential (primary) hypertension: Secondary | ICD-10-CM

## 2015-11-17 DIAGNOSIS — R413 Other amnesia: Secondary | ICD-10-CM | POA: Diagnosis not present

## 2015-11-17 DIAGNOSIS — E119 Type 2 diabetes mellitus without complications: Secondary | ICD-10-CM

## 2015-11-17 NOTE — Progress Notes (Signed)
Subjective:    Patient ID: [name], female    DOB: Jan 01, 1931, 82 y.o.   MRN: [id number]  11/17/2015  Follow-up   HPI This 82 y.o. female presents for follow-up for memory loss/dementia.  S/p MRI brain which reaveled the following:      Review of Systems  Constitutional: Negative for fever, chills, diaphoresis and fatigue.  Eyes: Negative for visual disturbance.  Respiratory: Negative for cough and shortness of breath.   Cardiovascular: Negative for chest pain, palpitations and leg swelling.  Gastrointestinal: Negative for nausea, vomiting, abdominal pain, diarrhea and constipation.  Endocrine: Negative for cold intolerance, heat intolerance, polydipsia, polyphagia and polyuria.  Neurological: Negative for dizziness, tremors, seizures, syncope, facial asymmetry, speech difficulty, weakness, light-headedness, numbness and headaches.    Past Medical History  Diagnosis Date  . Anemia   . Hypertension   . GERD (gastroesophageal reflux disease)   . Diabetes mellitus   . Hyperlipidemia   . DVT (deep venous thrombosis) ([place]) 03/2005    hx.left leg  . Glaucoma   . Asthma   . Esophageal stricture   . Tubular adenoma of colon   . Diverticulosis   . Osteoarthritis   . PVD (peripheral vascular disease) ([place])   . Blood transfusion 05-24-12  . Chronic kidney disease     dr Clover Mealy  . Blood transfusion without reported diagnosis   . Pulmonary nodules 05/07/13  . Pancreatic duct dilated ([place]) 05/07/13  . Hiatal hernia 05/07/13   Past Surgical History  Procedure Laterality Date  . Wrist fracture surgery      R wrist fracture, plate  . Esophagogastroduodenoscopy  05/26/2012    Procedure: ESOPHAGOGASTRODUODENOSCOPY (EGD);  Surgeon: Lafayette Dragon, MD;  Location: [facility];  Service: Endoscopy;  Laterality: N/A;  . Colonoscopy  05/27/2012    Procedure: COLONOSCOPY;  Surgeon: Lafayette Dragon, MD;  Location: [facility];  Service: Endoscopy;  Laterality: N/A;  . Eye surgery    .  Cataract extraction, bilateral    . Vascular surgery  09/2004  . Eus N/A 07/08/2013    Procedure: UPPER ENDOSCOPIC ULTRASOUND (EUS) LINEAR;  Surgeon: Milus Banister, MD;  Location: [facility];  Service: Endoscopy;  Laterality: N/A;  need side view scope   No Known Allergies Current Outpatient Prescriptions  Medication Sig Dispense Refill  . albuterol (PROAIR HFA) 108 (90 BASE) MCG/ACT inhaler USE 2 PUFFS EVERY 6 HOURS AS NEEDED FOR SHORTNESS OF BREATH 17 g 0  . Ascorbic Acid (VITAMIN C) 1000 MG tablet Take 1,000 mg by mouth daily.    Marland Kitchen aspirin 81 MG tablet Take 81 mg by mouth daily.    . beclomethasone (QVAR) 40 MCG/ACT inhaler Inhale 2 puffs into the lungs 2 (two) times daily. 1 Inhaler 12  . brimonidine (ALPHAGAN P) 0.1 % SOLN 1 drop 2 (two) times daily.    . calcium carbonate (OS-CAL) 600 MG TABS Take 600 mg by mouth 2 (two) times daily with a meal.    . dextrose (GLUTOSE) 40 % GEL Take 1 Tube by mouth once as needed.     . famotidine (PEPCID) 20 MG tablet TAKE ONE TABLET AT BEDTIME. 90 tablet 1  . fenofibrate (TRICOR) 48 MG tablet TAKE 1 TABLET ONCE DAILY. 90 tablet 0  . ferrous fumarate (HEMOCYTE - 106 MG FE) 325 (106 FE) MG TABS Take 1 tablet by mouth every evening.     Marland Kitchen levothyroxine (SYNTHROID, LEVOTHROID) 25 MCG tablet TAKE 1 TABLET DAILY BEFORE BREAKFAST 90 tablet 0  .  metFORMIN (GLUCOPHAGE) 500 MG tablet TAKE 1 TABLET ONCE DAILY WITH SUPPER. 30 tablet 4  . metoprolol succinate (TOPROL-XL) 50 MG 24 hr tablet TAKE 1 TABLET DAILY AFTER A MEAL. 90 tablet 0  . Multiple Vitamin (MULTIVITAMIN WITH MINERALS) TABS tablet Take 1 tablet by mouth daily.     No current facility-administered medications for this visit.   Social History   Social History  . Marital Status: Married    Spouse Name: N/A  . Number of Children: N/A  . Years of Education: N/A   Occupational History  . Not on file.   Social History Main Topics  . Smoking status: Never Smoker   . Smokeless tobacco: Never  Used  . Alcohol Use: No  . Drug Use: No  . Sexual Activity: Not on file   Other Topics Concern  . Not on file   Social History Narrative   Marital status: married x 60 years      Children:  4 children; 11 grandchildren; 2 gg      Lives: with husband, dog      Employment: Retired      Tobacco: never      Alcohol: none      Exercise:  Walking daily short distances; previous Firefighter      ADLs:  Walks with cane; cleans and cooks; drives.     Born: [place], [place]   Family History  Problem Relation Age of Onset  . Adopted: Yes  . Colon cancer Neg Hx   . Migraines Daughter   . Cancer Son        Objective:    BP 115/74 mmHg  Pulse 118  Temp(Src) 98.2 F (36.8 C) (Oral)  Resp 18  Ht 5' 6"  (1.676 m)  Wt 124 lb (56.246 kg)  BMI 20.02 kg/m2  SpO2 98% Physical Exam  Constitutional: She is oriented to person, place, and time. She appears well-developed and well-nourished. No distress.  HENT:  Head: Normocephalic and atraumatic.  Right Ear: External ear normal.  Left Ear: External ear normal.  Nose: Nose normal.  Mouth/Throat: Oropharynx is clear and moist.  Eyes: Conjunctivae and EOM are normal. Pupils are equal, round, and reactive to light.  Neck: Normal range of motion. Neck supple. Carotid bruit is not present. No thyromegaly present.  Cardiovascular: Normal rate, regular rhythm, normal heart sounds and intact distal pulses.  Exam reveals no gallop and no friction rub.   No murmur heard. Pulmonary/Chest: Effort normal and breath sounds normal. She has no wheezes. She has no rales.  Abdominal: Soft. Bowel sounds are normal. She exhibits no distension and no mass. There is no tenderness. There is no rebound and no guarding.  Lymphadenopathy:    She has no cervical adenopathy.  Neurological: She is alert and oriented to person, place, and time. No cranial nerve deficit.  Skin: Skin is warm and dry. No rash noted. She is not diaphoretic. No erythema. No pallor.    Psychiatric: She has a normal mood and affect. Her behavior is normal.   Results for orders placed or performed in visit on 10/27/15  CBC with Differential/Platelet  Result Value Ref Range   WBC 6.5 4.0 - 10.5 K/uL   RBC 4.40 3.87 - 5.11 MIL/uL   Hemoglobin 13.7 12.0 - 15.0 g/dL   HCT 41.8 36.0 - 46.0 %   MCV 95.0 78.0 - 100.0 fL   MCH 31.1 26.0 - 34.0 pg   MCHC 32.8 30.0 - 36.0 g/dL  RDW 13.4 11.5 - 15.5 %   Platelets 273 150 - 400 K/uL   MPV 9.7 8.6 - 12.4 fL   Neutrophils Relative % 65 43 - 77 %   Neutro Abs 4.2 1.7 - 7.7 K/uL   Lymphocytes Relative 24 12 - 46 %   Lymphs Abs 1.6 0.7 - 4.0 K/uL   Monocytes Relative 7 3 - 12 %   Monocytes Absolute 0.5 0.1 - 1.0 K/uL   Eosinophils Relative 4 0 - 5 %   Eosinophils Absolute 0.3 0.0 - 0.7 K/uL   Basophils Relative 0 0 - 1 %   Basophils Absolute 0.0 0.0 - 0.1 K/uL   Smear Review Criteria for review not met   Comprehensive metabolic panel  Result Value Ref Range   Sodium 139 135 - 146 mmol/L   Potassium 4.2 3.5 - 5.3 mmol/L   Chloride 102 98 - 110 mmol/L   CO2 27 20 - 31 mmol/L   Glucose, Bld 169 (H) 65 - 99 mg/dL   BUN 29 (H) 7 - 25 mg/dL   Creat 1.34 (H) 0.60 - 0.88 mg/dL   Total Bilirubin 0.7 0.2 - 1.2 mg/dL   Alkaline Phosphatase 37 33 - 130 U/L   AST 25 10 - 35 U/L   ALT 15 6 - 29 U/L   Total Protein 6.9 6.1 - 8.1 g/dL   Albumin 4.2 3.6 - 5.1 g/dL   Calcium 10.1 8.6 - 10.4 mg/dL  Hemoglobin A1c  Result Value Ref Range   Hgb A1c MFr Bld 6.7 (H) <5.7 %   Mean Plasma Glucose 146 (H) <117 mg/dL  Lipid panel  Result Value Ref Range   Cholesterol 177 125 - 200 mg/dL   Triglycerides 67 <150 mg/dL   HDL 63 >=46 mg/dL   Total CHOL/HDL Ratio 2.8 <=5.0 Ratio   VLDL 13 <30 mg/dL   LDL Cholesterol 101 <130 mg/dL  TSH  Result Value Ref Range   TSH 6.137 (H) 0.350 - 4.500 uIU/mL  T4, free  Result Value Ref Range   Free T4 1.07 0.80 - 1.80 ng/dL  RPR  Result Value Ref Range   RPR Ser Ql NON REAC NON REAC        Assessment & Plan:  No diagnosis found.  No orders of the defined types were placed in this encounter.   No orders of the defined types were placed in this encounter.    No Follow-up on file.    Evamae Rowen Elayne Guerin, M.D. [facility] [address] [place], [place]  [address] [phone] phone [phone] fax

## 2015-11-20 DIAGNOSIS — H401122 Primary open-angle glaucoma, left eye, moderate stage: Secondary | ICD-10-CM | POA: Diagnosis not present

## 2015-12-05 ENCOUNTER — Other Ambulatory Visit: Payer: Self-pay | Admitting: Family Medicine

## 2015-12-13 ENCOUNTER — Other Ambulatory Visit: Payer: Self-pay | Admitting: Family Medicine

## 2016-01-04 ENCOUNTER — Ambulatory Visit (INDEPENDENT_AMBULATORY_CARE_PROVIDER_SITE_OTHER): Payer: Medicare Other | Admitting: Neurology

## 2016-01-04 ENCOUNTER — Encounter: Payer: Self-pay | Admitting: Neurology

## 2016-01-04 VITALS — BP 134/63 | HR 70 | Ht 66.0 in | Wt 129.8 lb

## 2016-01-04 DIAGNOSIS — G309 Alzheimer's disease, unspecified: Secondary | ICD-10-CM

## 2016-01-04 DIAGNOSIS — F0280 Dementia in other diseases classified elsewhere without behavioral disturbance: Secondary | ICD-10-CM

## 2016-01-04 MED ORDER — DONEPEZIL HCL 5 MG PO TABS: 5.0000 mg | ORAL_TABLET | Freq: Every day | ORAL | Status: DC
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
## 2016-01-04 NOTE — Patient Instructions (Signed)
1.  We will start donepezil (Aricept) 5mg  daily for four weeks.  If you are tolerating the medication, then after four weeks, we will increase the dose to 10mg  daily.  Side effects include nausea, vomiting, diarrhea, vivid dreams, and muscle cramps.  Please call the clinic if you experience any of these symptoms. 2.  We will set you up for a formal driving evaluation.  You are not to drive until I review the results of the test. 3.  I would recommend that your children look over your bills to make sure they are paid correctly 4.  Follow up after testing. Alzheimer Disease Caregiver Guide A person who has Alzheimer disease may not be able to take care of himself or herself. He or she may need help with simple tasks. The tips below can help you care for the person. MEMORY LOSS AND CONFUSION If the person is confused or cannot remember things:  Stay calm.  Respond with a short answer.  Avoid correcting him or her in a way that sounds like scolding.  Try not to take it personally, even if he or she forgets your name. BEHAVIOR CHANGES The person may go through behavior changes. This can include depression, anxiety, anger, or seeing things that are not there. When behavior changes:  Try not to take behavior changes personally.  Stay calm and patient.  Do not argue or try to convince the person about a specific point.  Know that these changes are part of the disease process. Try to work through it. TIPS TO LESSEN FRUSTRATION  Make appointments and do daily tasks when the person is at his or her best.  Take your time. Simple tasks may take longer. Allow plenty of time to complete tasks.  Limit choices for the person.  Involve the person in what you are doing.  Stick to a routine.  Avoid new or crowded places, if possible.  Use simple words, short sentences, and a calm voice. Only give 1 direction at a time.  Buy clothes and shoes that are easy to put on and take off.  Let people  help if they offer. HOME SAFETY  Keep floors clear. Remove rugs, magazine racks, and floor lamps.  Keep hallways well lit.  Put a handrail and nonslip mat in the bathtub or shower.  Put childproof locks on cabinets that have dangerous items in them. These items include medicine, alcohol, guns, toxic cleaning items, sharp tools, matches, or lighters.  Place locks on doors where the person cannot see or reach them. This helps the person to not wander out of the house and get lost.  Be prepared for emergencies. Keep a list of emergency phone numbers and addresses in a handy area. PLANS FOR THE FUTURE   Talk about finances.  Talk about money management. People with Alzheimer disease have trouble managing their money as the disease gets worse.  Get help from professional advisors about financial and legal matters.  Talk about future care.  Choose a power of attorney. This is someone who can make decisions for the person with Alzheimer disease when he or she can no longer do so.  Talk about driving and when it is the right time to stop. The person's doctor can help with this.  If the person lives alone, make sure he or she is safe. Some people need extra help at home. Other people need more care at a nursing home or care center. SUPPORT GROUPS  Some benefits of joining a  support group include:   Learning ways to manage stress.  Sharing experiences with others.  Getting emotional comfort and support.  Learning new caregiving skills as the disease progresses.  Knowing what community resources are available and taking advantage of them. GET HELP IF:  The person has a fever.  The person has a sudden behavior change that does not get better with calming strategies.  The person is unable to manage his or her living situation.  The person threatens you or anyone else, including himself or herself.  You are no longer able to care for the person.   This information is not  intended to replace advice given to you by your health care provider. Make sure you discuss any questions you have with your health care provider.   Document Released: 12/30/2011 Document Revised: 10/28/2014 Document Reviewed: 12/30/2011 Elsevier Interactive Patient Education [employer].

## 2016-01-26 ENCOUNTER — Other Ambulatory Visit: Payer: Self-pay | Admitting: Family Medicine

## 2016-02-01 ENCOUNTER — Other Ambulatory Visit: Payer: Self-pay | Admitting: Family Medicine

## 2016-02-05 ENCOUNTER — Telehealth: Payer: Self-pay

## 2016-02-05 MED ORDER — DONEPEZIL HCL 10 MG PO TABS: 10.0000 mg | ORAL_TABLET | Freq: Every day | ORAL | Status: DC

## 2016-02-05 NOTE — Telephone Encounter (Signed)
Driving eval pt was to have done. States he cannot reach Mr. Lawanna Kobus. When I called vm stated the office was closed all last week and would open today. Assume that is why pt/family have not heard about referral. Fax sent to Mr. Ovid Curd.

## 2016-02-05 NOTE — Telephone Encounter (Signed)
-----   Message from Amada Kingfisher, [place] sent at 01/04/2016  2:50 PM EDT ----- Call and see how pt is doing on Aricept.

## 2016-02-05 NOTE — Telephone Encounter (Signed)
1. Will initiate Aricept 5mg  at bedtime. If tolerating, in 4 weeks will increase to 10mg  at bedtime.   Spoke to husband tolerating well. Will send in new RX for 10 mg tablets. Husband did have concerns about his

## 2016-02-26 ENCOUNTER — Ambulatory Visit: Payer: Self-pay | Admitting: Family Medicine

## 2016-02-27 ENCOUNTER — Ambulatory Visit: Payer: Self-pay | Admitting: Family Medicine

## 2016-03-04 DIAGNOSIS — H401122 Primary open-angle glaucoma, left eye, moderate stage: Secondary | ICD-10-CM | POA: Diagnosis not present

## 2016-03-17 ENCOUNTER — Other Ambulatory Visit: Payer: Self-pay | Admitting: Family Medicine

## 2016-04-17 ENCOUNTER — Other Ambulatory Visit: Payer: Self-pay | Admitting: Family Medicine

## 2016-04-19 ENCOUNTER — Other Ambulatory Visit: Payer: Self-pay | Admitting: Family Medicine

## 2016-05-08 ENCOUNTER — Other Ambulatory Visit: Payer: Self-pay | Admitting: Family Medicine

## 2016-05-19 ENCOUNTER — Other Ambulatory Visit: Payer: Self-pay | Admitting: Family Medicine

## 2016-05-28 ENCOUNTER — Encounter: Payer: Self-pay | Admitting: Family Medicine

## 2016-05-28 ENCOUNTER — Ambulatory Visit (INDEPENDENT_AMBULATORY_CARE_PROVIDER_SITE_OTHER): Payer: Medicare Other | Admitting: Family Medicine

## 2016-05-28 VITALS — BP 136/80 | HR 64 | Temp 98.5°F | Resp 18 | Ht 66.0 in | Wt 117.0 lb

## 2016-05-28 DIAGNOSIS — I10 Essential (primary) hypertension: Secondary | ICD-10-CM

## 2016-05-28 DIAGNOSIS — E2839 Other primary ovarian failure: Secondary | ICD-10-CM

## 2016-05-28 DIAGNOSIS — E7800 Pure hypercholesterolemia, unspecified: Secondary | ICD-10-CM | POA: Diagnosis not present

## 2016-05-28 DIAGNOSIS — N19 Unspecified kidney failure: Secondary | ICD-10-CM

## 2016-05-28 DIAGNOSIS — K862 Cyst of pancreas: Secondary | ICD-10-CM

## 2016-05-28 DIAGNOSIS — G301 Alzheimer's disease with late onset: Secondary | ICD-10-CM

## 2016-05-28 DIAGNOSIS — J4520 Mild intermittent asthma, uncomplicated: Secondary | ICD-10-CM

## 2016-05-28 DIAGNOSIS — J4530 Mild persistent asthma, uncomplicated: Secondary | ICD-10-CM

## 2016-05-28 DIAGNOSIS — F0280 Dementia in other diseases classified elsewhere without behavioral disturbance: Secondary | ICD-10-CM

## 2016-05-28 DIAGNOSIS — E038 Other specified hypothyroidism: Secondary | ICD-10-CM

## 2016-05-28 DIAGNOSIS — E119 Type 2 diabetes mellitus without complications: Secondary | ICD-10-CM | POA: Diagnosis not present

## 2016-05-28 DIAGNOSIS — E034 Atrophy of thyroid (acquired): Secondary | ICD-10-CM

## 2016-05-28 DIAGNOSIS — I70213 Atherosclerosis of native arteries of extremities with intermittent claudication, bilateral legs: Secondary | ICD-10-CM | POA: Diagnosis not present

## 2016-05-28 LAB — CBC WITH DIFFERENTIAL/PLATELET
BASOS PCT: 1 %
Basophils Absolute: 64 cells/uL (ref 0–200)
EOS ABS: 256 {cells}/uL (ref 15–500)
Eosinophils Relative: 4 %
HEMATOCRIT: 40.8 % (ref 35.0–45.0)
HEMOGLOBIN: 13.2 g/dL (ref 11.7–15.5)
LYMPHS ABS: 1408 {cells}/uL (ref 850–3900)
LYMPHS PCT: 22 %
MCH: 31.1 pg (ref 27.0–33.0)
MCHC: 32.4 g/dL (ref 32.0–36.0)
MCV: 96 fL (ref 80.0–100.0)
MONO ABS: 512 {cells}/uL (ref 200–950)
MPV: 9.4 fL (ref 7.5–12.5)
Monocytes Relative: 8 %
Neutro Abs: 4160 cells/uL (ref 1500–7800)
Neutrophils Relative %: 65 %
Platelets: 256 10*3/uL (ref 140–400)
RBC: 4.25 MIL/uL (ref 3.80–5.10)
RDW: 13.8 % (ref 11.0–15.0)
WBC: 6.4 10*3/uL (ref 3.8–10.8)

## 2016-05-28 LAB — COMPREHENSIVE METABOLIC PANEL
ALBUMIN: 4.2 g/dL (ref 3.6–5.1)
ALK PHOS: 39 U/L (ref 33–130)
ALT: 15 U/L (ref 6–29)
AST: 22 U/L (ref 10–35)
BILIRUBIN TOTAL: 0.7 mg/dL (ref 0.2–1.2)
BUN: 20 mg/dL (ref 7–25)
CALCIUM: 10.2 mg/dL (ref 8.6–10.4)
CO2: 34 mmol/L — ABNORMAL HIGH (ref 20–31)
Chloride: 102 mmol/L (ref 98–110)
Creat: 0.99 mg/dL — ABNORMAL HIGH (ref 0.60–0.88)
Glucose, Bld: 163 mg/dL — ABNORMAL HIGH (ref 65–99)
POTASSIUM: 4.1 mmol/L (ref 3.5–5.3)
Sodium: 141 mmol/L (ref 135–146)
TOTAL PROTEIN: 6.8 g/dL (ref 6.1–8.1)

## 2016-05-28 LAB — LIPID PANEL
CHOL/HDL RATIO: 2.4 ratio (ref <=5.0)
CHOLESTEROL: 152 mg/dL (ref 125–200)
HDL: 64 mg/dL (ref >=46)
LDL Cholesterol: 76 mg/dL (ref <130)
TRIGLYCERIDES: 60 mg/dL (ref <150)
VLDL: 12 mg/dL (ref <30)

## 2016-05-28 LAB — TSH: TSH: 5.33 mIU/L — ABNORMAL HIGH

## 2016-05-28 LAB — POCT GLYCOSYLATED HEMOGLOBIN (HGB A1C): Hemoglobin A1C: 6.4

## 2016-05-28 LAB — T4, FREE: Free T4: 1 ng/dL (ref 0.8–1.8)

## 2016-05-28 MED ORDER — BECLOMETHASONE DIPROPIONATE 40 MCG/ACT IN AERS: 2.0000 | INHALATION_SPRAY | Freq: Two times a day (BID) | RESPIRATORY_TRACT | 12 refills | Status: DC

## 2016-05-28 MED ORDER — FLUTICASONE PROPIONATE 50 MCG/ACT NA SUSP: 2.0000 | Freq: Every day | NASAL | 11 refills | Status: DC

## 2016-05-28 MED ORDER — DONEPEZIL HCL 10 MG PO TABS: 10.0000 mg | ORAL_TABLET | Freq: Every day | ORAL | 3 refills | Status: DC

## 2016-05-28 NOTE — Patient Instructions (Signed)
     IF you received an x-ray today, you will receive an invoice from [facility]. Please contact  Radiology at [phone] with questions or concerns regarding your invoice.   IF you received labwork today, you will receive an invoice from [facility]. Please contact [facility] at [phone] with questions or concerns regarding your invoice.   Our billing staff will not be able to assist you with questions regarding bills from these companies.  You will be contacted with the lab results as soon as they are available. The fastest way to get your results is to activate your My Chart account. Instructions are located on the last page of this paperwork. If you have not heard from us regarding the results in 2 weeks, please contact this office.      

## 2016-05-28 NOTE — Progress Notes (Signed)
Subjective:    Patient ID: [name], female    DOB: 1931/09/06, 82 y.o.   MRN: [id number]  05/28/2016  Follow-up (4 month)   HPI This 82 y.o. female presents for four month follow-up:  1.  Alzheimer's dementia:  S/p consultation by Dr. Tomi Likens of neurology regarding memory loss; s/p MRI brain that revealed prominent atrophy and moderate chronic small vessel ischemic changes. S/P TSH in 10/2015; RPR non-reactive.  MMSE on 01/04/16 with Dr. Tomi Likens of [facility] for [facility] Cognitive Assessment.  Patient was diagnosed with Alzheimer's dementia; initiated on Aricept 5mg  with plan to increase to 10mg  in one month; Dr. Tomi Likens recommended no driving until pt had completed a formal OT driving evaluation; husband has not taken patient for this testing.  Dr. Tomi Likens recommended that patient not have access to checkbook, credit cards, or debit cards.  Dr. Tomi Likens also recommended husband undergo neurology consultation due to his memory loss.  Recommended follow-up after driving evaluation.  Pt has not followed up with neurology because she has not completed driving test; husband states that he does not know how to keep pt from driving.  Pt has continued to drive for the past five months.  Tolerating Aricept without difficulties.  Husband has noticed that patient is becoming weaker and more unsteady; pt is having difficulties walking upstairs to bedroom.  Husband has purchased her a cane.  2.  DMII: Patient reports good compliance with medication, good tolerance to medication, and good symptom control.    3.  Asthma: uses Albuterol several times per day; does not recall QVAR; husband does not recall QVAR; does not think they have any at the house.  +SOB; +wheezing.    4.  Hypothyroidism; TSH 6.3 at last visit; due for repeat.   Review of Systems  Constitutional: Negative for chills, diaphoresis, fatigue and fever.  Eyes: Negative for visual disturbance.  Respiratory: Negative for cough and shortness of breath.     Cardiovascular: Negative for chest pain, palpitations and leg swelling.  Gastrointestinal: Negative for abdominal pain, constipation, diarrhea, nausea and vomiting.  Endocrine: Negative for cold intolerance, heat intolerance, polydipsia, polyphagia and polyuria.  Neurological: Negative for dizziness, tremors, seizures, syncope, facial asymmetry, speech difficulty, weakness, light-headedness, numbness and headaches.  Psychiatric/Behavioral: Positive for confusion and decreased concentration. Negative for dysphoric mood, self-injury, sleep disturbance and suicidal ideas. The patient is not nervous/anxious.     Past Medical History:  Diagnosis Date  . Anemia   . Asthma   . Blood transfusion 05-24-12  . Blood transfusion without reported diagnosis   . Chronic kidney disease    dr Clover Mealy  . Diabetes mellitus   . Diverticulosis   . DVT (deep venous thrombosis) ([place]) 03/2005   hx.left leg  . Esophageal stricture   . GERD (gastroesophageal reflux disease)   . Glaucoma   . Hiatal hernia 05/07/13  . Hyperlipidemia   . Hypertension   . Osteoarthritis   . Pancreatic duct dilated ([place]) 05/07/13  . Pulmonary nodules 05/07/13  . PVD (peripheral vascular disease) ([place])   . Tubular adenoma of colon    Past Surgical History:  Procedure Laterality Date  . CATARACT EXTRACTION, BILATERAL    . COLONOSCOPY  05/27/2012   Procedure: COLONOSCOPY;  Surgeon: Lafayette Dragon, MD;  Location: [facility];  Service: Endoscopy;  Laterality: N/A;  . ESOPHAGOGASTRODUODENOSCOPY  05/26/2012   Procedure: ESOPHAGOGASTRODUODENOSCOPY (EGD);  Surgeon: Lafayette Dragon, MD;  Location: [facility];  Service: Endoscopy;  Laterality: N/A;  . EUS N/A  07/08/2013   Procedure: UPPER ENDOSCOPIC ULTRASOUND (EUS) LINEAR;  Surgeon: Milus Banister, MD;  Location: [facility];  Service: Endoscopy;  Laterality: N/A;  need side view scope  . EYE SURGERY    . VASCULAR SURGERY  09/2004  . WRIST FRACTURE SURGERY     R wrist fracture, plate    No Known Allergies  Social History   Social History  . Marital status: Married    Spouse name: N/A  . Number of children: N/A  . Years of education: N/A   Occupational History  . Not on file.   Social History Main Topics  . Smoking status: Never Smoker  . Smokeless tobacco: Never Used  . Alcohol use No  . Drug use: No  . Sexual activity: Not on file   Other Topics Concern  . Not on file   Social History Narrative   Marital status: married x 60 years      Children:  4 children; 11 grandchildren; 2 gg      Lives: with husband, dog      Employment: Retired      Tobacco: never      Alcohol: none      Exercise:  Walking daily short distances; previous Firefighter      ADLs:  Walks with cane; cleans and cooks; drives.     Born: [place], [place]   Has 2 years of college.    Family History  Problem Relation Age of Onset  . Adopted: Yes  . Migraines Daughter   . Cancer Son   . Colon cancer Neg Hx        Objective:    BP 136/80 (BP Location: Right Arm, Patient Position: Sitting, Cuff Size: Small)   Pulse 64   Temp 98.5 F (36.9 C) (Oral)   Resp 18   Ht 5\' 6"  (1.676 m)   Wt 117 lb (53.1 kg)   SpO2 95%   BMI 18.88 kg/m  Physical Exam  Constitutional: She is oriented to person, place, and time. She appears well-developed and well-nourished. No distress.  HENT:  Head: Normocephalic and atraumatic.  Eyes: Conjunctivae are normal. Pupils are equal, round, and reactive to light.  Neck: Normal range of motion. Neck supple.  Cardiovascular: Normal rate, regular rhythm and normal heart sounds.  Exam reveals no gallop and no friction rub.   No murmur heard. Pulmonary/Chest: Effort normal and breath sounds normal. She has no wheezes. She has no rales.  Neurological: She is alert and oriented to person, place, and time. No cranial nerve deficit. She exhibits normal muscle tone. Coordination normal.  Skin: She is not diaphoretic.  Psychiatric: She has a normal mood and  affect. Her behavior is normal.  Becomes very upset when recommend to pt that she should no longer drive.  Raises her voice at provider and interrupts during conversation.  Nursing note and vitals reviewed.       Assessment & Plan:   1. Type 2 diabetes mellitus without complication, without long-term current use of insulin ([place])   2. Hypothyroidism due to acquired atrophy of thyroid   3. Late onset Alzheimer's disease without behavioral disturbance   4. HYPERTENSION, BENIGN SYSTEMIC   5. Atherosclerosis of native artery of both lower extremities with intermittent claudication ([place])   6. Renal failure   7. Asthma, mild persistent, uncomplicated   8. Pancreas cyst   9. Pure hypercholesterolemia   10. Asthma, mild intermittent, uncomplicated   11. Estrogen deficiency    -recent  diagnosis of Alzheimer's dementia; pt has continued to drive and has not undergone a driving evaluation.  Also has not followed up with neurology; I again highly recommended against patient driving.  I highly recommend undergoing driving evaluation and follow-up with neurology; pt tolerating Aricept well. -refer to physical therapy due to deconditioning and unsteadiness; I suggested moving master bedroom to first floor yet pt and husband insistent that this is not a good idea for patient.  Recommend HH evaluation for home safety with dementia. -refer for bone density scan. -obtain labs.  -rx for Flonase and QVAR provided.  Educated pt and husband on use of QVAR.   Orders Placed This Encounter  Procedures  . DG Bone Density    Standing Status:   Future    Standing Expiration Date:   07/28/2017    Order Specific Question:   Reason for Exam (SYMPTOM  OR DIAGNOSIS REQUIRED)    Answer:   screening; estrogen deficiency    Order Specific Question:   Preferred imaging location?    Answer:   [facility]  . CBC with Differential/Platelet  . Comprehensive metabolic panel    Order Specific Question:   Has the  patient fasted?    Answer:   Yes  . Lipid panel    Order Specific Question:   Has the patient fasted?    Answer:   Yes  . TSH  . T4, free  . Ambulatory referral to Physical Therapy    Referral Priority:   Routine    Referral Type:   Physical Medicine    Referral Reason:   Specialty Services Required    Requested Specialty:   Physical Therapy    Number of Visits Requested:   1  . POCT glycosylated hemoglobin (Hb A1C)   Meds ordered this encounter  Medications  . beclomethasone (QVAR) 40 MCG/ACT inhaler    Sig: Inhale 2 puffs into the lungs 2 (two) times daily.    Dispense:  1 Inhaler    Refill:  12  . fluticasone (FLONASE) 50 MCG/ACT nasal spray    Sig: Place 2 sprays into both nostrils daily.    Dispense:  16 g    Refill:  11  . donepezil (ARICEPT) 10 MG tablet    Sig: Take 1 tablet (10 mg total) by mouth at bedtime.    Dispense:  30 tablet    Refill:  3    Return in about 3 months (around 08/28/2016) for complete physical examiniation.   Dabney Schanz Elayne Guerin, M.D. [facility] [address] [place], [place]  [address] [phone] phone [phone] fax

## 2016-05-28 NOTE — Progress Notes (Signed)
Patient ID: [name], female   DOB: 06-26-1931, 82 y.o.   MRN: [id number]   By signing my name below I, [name], attest that this documentation has been prepared under the direction and in the presence of [name]. Electonically Signed. [name], Scribe 05/28/2016 at 1:11 PM   Subjective:    Patient ID: [name], female    DOB: [birth year], 82 y.o.   MRN: [id number]  05/28/2016  Follow-up (4 month)   HPI [name] is a 82 y.o. female who presents to the [facility] for follow up evaluation.   Alzheimer's Pt dx with Alzheimer's dementia by [facility] on 01/04/16. Pt states she is "climbing the walls" and doing well. Pt has been working in her garden frequently and just planted 18 geraniums. Pt is due for a follow up because the pt is about to run out of her Aricept 5mg . Pt was also recommended to have a driving test done. Pt's husband states that the pt has not had a driver's test because the pt only drives to see their family, to the grocery store, and to her hair appointments, all are within a 3 mile radius and on small roads. Pt's husband also reports that the neurologist "overreacted".   Pt states her husband is "ugly to her" telling her to "shut up, go to bed, and wake up we got to go to the doctor".   Pt denies any recent falls. Pt states that she has felt mildly unstable, so she started using a cane. Pt states that she still drives and her husband just bought her a new car. Pt reports having a regular BM every day.   Osteoarthritis  Pt is due for a bone density scan.   DM Pt states she does not check her blood sugar regularly. Pt takes her metformin regularly "if [her] husband gives it to [her] every morning." Husband reports pt has been eating lots of sweets.   Asthma Pt states that her breathing has been "lousy" because her house is dry. Pt has been taking her proair every day sometimes QD, sometimes BID, with great  relief.  Allergies Pt states that she has been having regular rhinorrhea because she is working outside in her garden everyday. Pt does not take Flonase, Claritin or zyrtec.  Pt has history of hypothryroidism, HTN, HLD, and DM.  Review of Systems  Constitutional: Negative for chills, diaphoresis, fatigue and fever.  HENT: Negative for ear pain, postnasal drip, rhinorrhea, sinus pressure, sore throat and trouble swallowing.   Respiratory: Negative for cough and shortness of breath.   Cardiovascular: Negative for chest pain, palpitations and leg swelling.  Gastrointestinal: Negative for abdominal pain, constipation, diarrhea, nausea and vomiting.    Past Medical History:  Diagnosis Date   Anemia    Asthma    Blood transfusion 05-24-12   Blood transfusion without reported diagnosis    Chronic kidney disease    [name]   Diabetes mellitus    Diverticulosis    DVT (deep venous thrombosis) ([place]) 03/2005   hx.left leg   Esophageal stricture    GERD (gastroesophageal reflux disease)    Glaucoma    Hiatal hernia 05/07/13   Hyperlipidemia    Hypertension    Osteoarthritis    Pancreatic duct dilated ([place]) 05/07/13   Pulmonary nodules 05/07/13   PVD (peripheral vascular disease) ([facility])    Tubular adenoma of colon    Past Surgical History:  Procedure Laterality Date  CATARACT EXTRACTION, BILATERAL     COLONOSCOPY  05/27/2012   Procedure: COLONOSCOPY;  Surgeon: Megan Dragon, Clements;  Location: [facility];  Service: Endoscopy;  Laterality: N/A;   ESOPHAGOGASTRODUODENOSCOPY  05/26/2012   Procedure: ESOPHAGOGASTRODUODENOSCOPY (EGD);  Surgeon: Megan Dragon, Clements;  Location: [facility];  Service: Endoscopy;  Laterality: N/A;   EUS N/A 07/08/2013   Procedure: UPPER ENDOSCOPIC ULTRASOUND (EUS) LINEAR;  Surgeon: Megan Banister, Clements;  Location: [facility];  Service: Endoscopy;  Laterality: N/A;  need side view scope   EYE SURGERY     VASCULAR SURGERY  09/2004   WRIST  FRACTURE SURGERY     R wrist fracture, plate   No Known Allergies Current Outpatient Prescriptions  Medication Sig Dispense Refill   Ascorbic Acid (VITAMIN C) 1000 MG tablet Take 1,000 mg by mouth daily.     aspirin 81 MG tablet Take 81 mg by mouth daily.     beclomethasone (QVAR) 40 MCG/ACT inhaler Inhale 2 puffs into the lungs 2 (two) times daily. 1 Inhaler 12   brimonidine (ALPHAGAN P) 0.1 % SOLN 1 drop 2 (two) times daily.     calcium carbonate (OS-CAL) 600 MG TABS Take 600 mg by mouth 2 (two) times daily with a meal.     dextrose (GLUTOSE) 40 % GEL Take 1 Tube by mouth once as needed.      donepezil (ARICEPT) 10 MG tablet Take 1 tablet (10 mg total) by mouth at bedtime. 30 tablet 3   famotidine (PEPCID) 20 MG tablet TAKE ONE TABLET AT BEDTIME. 90 tablet 0   fenofibrate (TRICOR) 48 MG tablet TAKE 1 TABLET ONCE DAILY. 90 tablet 1   ferrous fumarate (HEMOCYTE - 106 MG FE) 325 (106 FE) MG TABS Take 1 tablet by mouth every evening.      levothyroxine (SYNTHROID, LEVOTHROID) 25 MCG tablet TAKE 1 TABLET DAILY BEFORE BREAKFAST 90 tablet 0   metFORMIN (GLUCOPHAGE) 500 MG tablet TAKE 1 TABLET ONCE DAILY WITH SUPPER. 30 tablet 0   metoprolol succinate (TOPROL-XL) 50 MG 24 hr tablet TAKE 1 TABLET DAILY AFTER A MEAL. 90 tablet 1   Multiple Vitamin (MULTIVITAMIN WITH MINERALS) TABS tablet Take 1 tablet by mouth daily.     PROAIR HFA 108 (90 Base) MCG/ACT inhaler USE 2 PUFFS EVERY 6 HOURS AS NEEDED FOR SHORTNESS OF BREATH. 17 g 0   fluticasone (FLONASE) 50 MCG/ACT nasal spray Place 2 sprays into both nostrils daily. 16 g 11   No current facility-administered medications for this visit.    Social History   Social History   Marital status: Married    Spouse name: N/A   Number of children: N/A   Years of education: N/A   Occupational History   Not on file.   Social History Main Topics   Smoking status: Never Smoker   Smokeless tobacco: Never Used   Alcohol use No    Drug use: No   Sexual activity: Not on file   Other Topics Concern   Not on file   Social History Narrative   Marital status: married x 60 years      Children:  4 children; 11 grandchildren; 2 gg      Lives: with husband, dog      Employment: Retired      Tobacco: never      Alcohol: none      Exercise:  Walking daily short distances; previous Firefighter      ADLs:  Walks with cane; cleans  and cooks; drives.     Born: [place], [place]   Has 2 years of college.    Family History  Problem Relation Age of Onset   Adopted: Yes   Migraines Daughter    Cancer Son    Colon cancer Neg Hx        Objective:    BP 136/80 (BP Location: Right Arm, Patient Position: Sitting, Cuff Size: Small)    Pulse 64    Temp 98.5 F (36.9 C) (Oral)    Resp 18    Ht 5\' 6"  (1.676 m)    Wt 117 lb (53.1 kg)    SpO2 95%    BMI 18.88 kg/m  Physical Exam  Constitutional: She is oriented to person, place, and time. She appears well-developed and well-nourished. No distress.  HENT:  Head: Normocephalic and atraumatic.  Right Ear: External ear normal.  Left Ear: External ear normal.  Nose: Nose normal.  Mouth/Throat: Oropharynx is clear and moist.  Eyes: Conjunctivae and EOM are normal. Pupils are equal, round, and reactive to light.  Neck: Normal range of motion. Neck supple. Carotid bruit is not present. No thyromegaly present.  Cardiovascular: Normal rate, regular rhythm and intact distal pulses.  Exam reveals no gallop and no friction rub.   No murmur heard. Pulmonary/Chest: Effort normal and breath sounds normal. No respiratory distress. She has no decreased breath sounds. She has no wheezes. She has no rhonchi. She has no rales.  Abdominal: Soft. Bowel sounds are normal. She exhibits no distension and no mass. There is no tenderness. There is no rebound and no guarding.  Musculoskeletal:  1+ pitting bipedal edema to mid-calf.   Lymphadenopathy:    She has no cervical adenopathy.   Neurological: She is alert and oriented to person, place, and time. No cranial nerve deficit.  Skin: Skin is warm and dry. No rash noted. She is not diaphoretic. No erythema. No pallor.  Psychiatric: She has a normal mood and affect. Her behavior is normal.   Results for orders placed or performed in visit on 05/28/16  POCT glycosylated hemoglobin (Hb A1C)  Result Value Ref Range   Hemoglobin A1C 6.4        Assessment & Plan:   1. Type 2 diabetes mellitus without complication, without long-term current use of insulin ([place])   2. Hypothyroidism due to acquired atrophy of thyroid   3. Late onset Alzheimer's disease without behavioral disturbance   4. HYPERTENSION, BENIGN SYSTEMIC   5. Atherosclerosis of native artery of both lower extremities with intermittent claudication ([place])   6. Renal failure   7. Asthma, mild persistent, uncomplicated   8. Pancreas cyst   9. Pure hypercholesterolemia   10. Asthma, mild intermittent, uncomplicated   11. Estrogen deficiency    Recommended pt start Qvar and flonase.   Also recommended pt start physical therapy to help treat her unstable.  Planned for follow up visit and physical in 3-4 months, after neurologist reevaluation.  Strongly recommended that the pt follow up with neurologist.   Strongly recommended that the pt only drive with someone else in the car.   Orders Placed This Encounter  Procedures   DG Bone Density    Standing Status:   Future    Standing Expiration Date:   07/28/2017    Order Specific Question:   Reason for Exam (SYMPTOM  OR DIAGNOSIS REQUIRED)    Answer:   screening; estrogen deficiency    Order Specific Question:   Preferred imaging location?  Answer:   [facility]   CBC with Differential/Platelet   Comprehensive metabolic panel    Order Specific Question:   Has the patient fasted?    Answer:   Yes   Lipid panel    Order Specific Question:   Has the patient fasted?    Answer:   Yes   TSH    T4, free   Ambulatory referral to Physical Therapy    Referral Priority:   Routine    Referral Type:   Physical Medicine    Referral Reason:   Specialty Services Required    Requested Specialty:   Physical Therapy    Number of Visits Requested:   1   POCT glycosylated hemoglobin (Hb A1C)   Meds ordered this encounter  Medications   beclomethasone (QVAR) 40 MCG/ACT inhaler    Sig: Inhale 2 puffs into the lungs 2 (two) times daily.    Dispense:  1 Inhaler    Refill:  12   fluticasone (FLONASE) 50 MCG/ACT nasal spray    Sig: Place 2 sprays into both nostrils daily.    Dispense:  16 g    Refill:  11   donepezil (ARICEPT) 10 MG tablet    Sig: Take 1 tablet (10 mg total) by mouth at bedtime.    Dispense:  30 tablet    Refill:  3    Return in about 3 months (around 08/28/2016) for complete physical examiniation.   I personally performed the services described in this documentation, which was scribed in my presence. The recorded information has been reviewed and considered.  [name] Elayne Guerin, M.D. [facility] [address] [place], [place]  [id number] [phone] phone [phone] fax

## 2016-06-09 ENCOUNTER — Other Ambulatory Visit: Payer: Self-pay | Admitting: Family Medicine

## 2016-06-17 ENCOUNTER — Encounter: Payer: Self-pay | Admitting: Family Medicine

## 2016-06-18 ENCOUNTER — Other Ambulatory Visit: Payer: Self-pay | Admitting: Family Medicine

## 2016-06-21 ENCOUNTER — Other Ambulatory Visit: Payer: Self-pay

## 2016-06-21 ENCOUNTER — Other Ambulatory Visit: Payer: Self-pay | Admitting: Family Medicine

## 2016-06-21 DIAGNOSIS — G309 Alzheimer's disease, unspecified: Secondary | ICD-10-CM | POA: Diagnosis not present

## 2016-06-21 DIAGNOSIS — M6281 Muscle weakness (generalized): Secondary | ICD-10-CM | POA: Diagnosis not present

## 2016-06-21 DIAGNOSIS — R262 Difficulty in walking, not elsewhere classified: Secondary | ICD-10-CM | POA: Diagnosis not present

## 2016-07-01 DIAGNOSIS — R262 Difficulty in walking, not elsewhere classified: Secondary | ICD-10-CM | POA: Diagnosis not present

## 2016-07-01 DIAGNOSIS — M6281 Muscle weakness (generalized): Secondary | ICD-10-CM | POA: Diagnosis not present

## 2016-07-01 DIAGNOSIS — G309 Alzheimer's disease, unspecified: Secondary | ICD-10-CM | POA: Diagnosis not present

## 2016-07-03 DIAGNOSIS — M6281 Muscle weakness (generalized): Secondary | ICD-10-CM | POA: Diagnosis not present

## 2016-07-03 DIAGNOSIS — G309 Alzheimer's disease, unspecified: Secondary | ICD-10-CM | POA: Diagnosis not present

## 2016-07-03 DIAGNOSIS — R262 Difficulty in walking, not elsewhere classified: Secondary | ICD-10-CM | POA: Diagnosis not present

## 2016-07-08 ENCOUNTER — Other Ambulatory Visit: Payer: Self-pay | Admitting: Family Medicine

## 2016-07-08 ENCOUNTER — Encounter: Payer: Self-pay | Admitting: Family Medicine

## 2016-07-08 DIAGNOSIS — G309 Alzheimer's disease, unspecified: Secondary | ICD-10-CM | POA: Diagnosis not present

## 2016-07-08 DIAGNOSIS — M6281 Muscle weakness (generalized): Secondary | ICD-10-CM | POA: Diagnosis not present

## 2016-07-08 DIAGNOSIS — R262 Difficulty in walking, not elsewhere classified: Secondary | ICD-10-CM | POA: Diagnosis not present

## 2016-07-15 DIAGNOSIS — R262 Difficulty in walking, not elsewhere classified: Secondary | ICD-10-CM | POA: Diagnosis not present

## 2016-07-15 DIAGNOSIS — M6281 Muscle weakness (generalized): Secondary | ICD-10-CM | POA: Diagnosis not present

## 2016-07-15 DIAGNOSIS — G309 Alzheimer's disease, unspecified: Secondary | ICD-10-CM | POA: Diagnosis not present

## 2016-07-17 ENCOUNTER — Encounter: Payer: Self-pay | Admitting: *Deleted

## 2016-07-29 ENCOUNTER — Encounter: Payer: Self-pay | Admitting: Physician Assistant

## 2016-07-29 ENCOUNTER — Ambulatory Visit (INDEPENDENT_AMBULATORY_CARE_PROVIDER_SITE_OTHER): Payer: Medicare Other

## 2016-07-29 ENCOUNTER — Ambulatory Visit (INDEPENDENT_AMBULATORY_CARE_PROVIDER_SITE_OTHER): Payer: Medicare Other | Admitting: Physician Assistant

## 2016-07-29 VITALS — BP 146/68 | HR 61 | Temp 97.6°F | Resp 18 | Ht 64.5 in | Wt 114.8 lb

## 2016-07-29 DIAGNOSIS — R8290 Unspecified abnormal findings in urine: Secondary | ICD-10-CM | POA: Diagnosis not present

## 2016-07-29 DIAGNOSIS — J90 Pleural effusion, not elsewhere classified: Secondary | ICD-10-CM | POA: Diagnosis not present

## 2016-07-29 DIAGNOSIS — R531 Weakness: Secondary | ICD-10-CM

## 2016-07-29 DIAGNOSIS — R05 Cough: Secondary | ICD-10-CM

## 2016-07-29 DIAGNOSIS — R059 Cough, unspecified: Secondary | ICD-10-CM

## 2016-07-29 DIAGNOSIS — R5383 Other fatigue: Secondary | ICD-10-CM | POA: Diagnosis not present

## 2016-07-29 DIAGNOSIS — E119 Type 2 diabetes mellitus without complications: Secondary | ICD-10-CM

## 2016-07-29 DIAGNOSIS — I70213 Atherosclerosis of native arteries of extremities with intermittent claudication, bilateral legs: Secondary | ICD-10-CM

## 2016-07-29 LAB — POCT CBC
GRANULOCYTE PERCENT: 86.8 % — AB (ref 37–80)
HCT, POC: 41.6 % (ref 37.7–47.9)
Hemoglobin: 14.3 g/dL (ref 12.2–16.2)
Lymph, poc: 1.4 (ref 0.6–3.4)
MCH, POC: 31.9 pg — AB (ref 27–31.2)
MCHC: 34.4 g/dL (ref 31.8–35.4)
MCV: 92.7 fL (ref 80–97)
MID (cbc): 0.3 (ref 0–0.9)
MPV: 7.3 fL (ref 0–99.8)
PLATELET COUNT, POC: 275 10*3/uL (ref 142–424)
POC Granulocyte: 11 — AB (ref 2–6.9)
POC LYMPH %: 11.2 % (ref 10–50)
POC MID %: 2 %M (ref 0–12)
RBC: 4.49 M/uL (ref 4.04–5.48)
RDW, POC: 13.8 %
WBC: 12.7 10*3/uL — AB (ref 4.6–10.2)

## 2016-07-29 NOTE — Progress Notes (Signed)
Patient ID: [name], female    DOB: [birth year], 82 y.o.   MRN: [id number]  PCP: Reginia Forts, MD  Subjective:   Chief Complaint  Patient presents with  . Fatigue    no energy    HPI Presents for evaluation of generalized malaise. She is accompanied by her husband and daughter.  She reports feeling fine, denies pain and new symptoms. Her husband reports that she has a mild, somewhat chronic cough, and that her urine has a different odor. Her husband and daughter report that she didn't want to go to church yesterday, claiming that she didn't feel well, but she says that she just didn't want to go.  Her husband notes that for the past month she has been using her cane with ambulation, even in the house. No other changes in her activity, no precipitous mental status changes.    Review of Systems  Constitutional: Positive for activity change and fatigue. Negative for appetite change, chills, diaphoresis, fever and unexpected weight change.  HENT: Negative for congestion, facial swelling, nosebleeds and rhinorrhea.   Eyes: Negative for discharge and redness.  Respiratory: Positive for cough. Negative for apnea, choking, chest tightness, shortness of breath, wheezing and stridor.   Cardiovascular: Negative for chest pain, palpitations and leg swelling.  Gastrointestinal: Negative for abdominal pain, blood in stool, constipation, diarrhea, nausea and vomiting.  Endocrine: Negative for polydipsia, polyphagia and polyuria.  Genitourinary: Negative for decreased urine volume, difficulty urinating, dysuria, frequency, hematuria and urgency.  Musculoskeletal: Positive for gait problem. Negative for arthralgias and back pain.  Skin: Negative for rash.  Neurological: Negative for dizziness, tremors, syncope, speech difficulty, weakness, light-headedness and headaches.  Hematological: Negative for adenopathy.  Psychiatric/Behavioral: Positive for confusion. Negative for agitation and  hallucinations.       Patient Active Problem List   Diagnosis Date Noted  . Alzheimer's disease 01/04/2016  . Pancreas cyst 07/02/2015  . Asthma, chronic 03/16/2015  . Primary open angle glaucoma of left eye, moderate stage 12/25/2014  . Hypothyroidism 11/17/2014  . Pseudophakia of both eyes 02/15/2013  . Anemia 05/25/2012  . Astigmatism 04/29/2012  . ALLERGIC RHINITIS 10/30/2010  . Asthma 09/27/2010  . HIP PAIN, RIGHT 07/20/2007  . DYSPHAGIA, UNSPECIFIED 07/20/2007  . Venous (peripheral) insufficiency 04/13/2007  . Diabetes ([place]) 12/18/2006  . HYPERTENSION, BENIGN SYSTEMIC 12/18/2006  . Atherosclerosis of native arteries of extremity with intermittent claudication ([place]) 12/18/2006  . Renal failure 12/18/2006  . OSTEOARTHRITIS OF SPINE, NOS 12/18/2006     Prior to Admission medications   Medication Sig Start Date End Date Taking? Authorizing Provider  Ascorbic Acid (VITAMIN C) 1000 MG tablet Take 1,000 mg by mouth daily.   Yes Historical Provider, MD  aspirin 81 MG tablet Take 81 mg by mouth daily.   Yes Historical Provider, MD  beclomethasone (QVAR) 40 MCG/ACT inhaler Inhale 2 puffs into the lungs 2 (two) times daily. 05/28/16  Yes Wardell Honour, MD  brimonidine (ALPHAGAN P) 0.1 % SOLN INSTILL 1 DROP IN LEFT EYE TWICE DAILY. 04/24/16  Yes Historical Provider, MD  calcium carbonate (OS-CAL) 600 MG TABS Take 600 mg by mouth 2 (two) times daily with a meal.   Yes Historical Provider, MD  dextrose (GLUTOSE) 40 % GEL Take 1 Tube by mouth once as needed.    Yes Historical Provider, MD  donepezil (ARICEPT) 10 MG tablet Take 1 tablet (10 mg total) by mouth at bedtime. 05/28/16  Yes Wardell Honour, MD  famotidine (PEPCID) 20 MG tablet TAKE  ONE TABLET AT BEDTIME. 05/10/16  Yes Wardell Honour, MD  fenofibrate (TRICOR) 48 MG tablet TAKE 1 TABLET ONCE DAILY. 06/21/16  Yes Wardell Honour, MD  ferrous fumarate (HEMOCYTE - 106 MG FE) 325 (106 FE) MG TABS Take 1 tablet by mouth every evening.    Yes  Historical Provider, MD  fluticasone (FLONASE) 50 MCG/ACT nasal spray Place 2 sprays into both nostrils daily. 05/28/16  Yes Wardell Honour, MD  levothyroxine (SYNTHROID, LEVOTHROID) 25 MCG tablet TAKE 1 TABLET DAILY BEFORE BREAKFAST 06/12/15  Yes Bernese Doffing, PA-C  metFORMIN (GLUCOPHAGE) 500 MG tablet TAKE 1 TABLET ONCE DAILY WITH SUPPER. 06/21/16  Yes Wardell Honour, MD  metoprolol succinate (TOPROL-XL) 50 MG 24 hr tablet TAKE 1 TABLET DAILY AFTER A MEAL. 06/25/16  Yes Wardell Honour, MD  Multiple Vitamin (MULTIVITAMIN WITH MINERALS) TABS tablet Take 1 tablet by mouth daily.   Yes Historical Provider, MD  PROAIR HFA 108 [phone] Base) MCG/ACT inhaler USE 2 PUFFS EVERY 6 HOURS AS NEEDED FOR SHORTNESS OF BREATH. 06/11/16  Yes Wardell Honour, MD     No Known Allergies     Objective:  Physical Exam  Constitutional: She appears well-developed and well-nourished. No distress.  BP (!) 146/68 (BP Location: Right Arm, Patient Position: Sitting, Cuff Size: Normal)   Pulse 61   Temp 97.6 F (36.4 C) (Oral)   Resp 18   Ht 5' 4.5" (1.638 m)   Wt 114 lb 12.8 oz (52.1 kg)   SpO2 94%   BMI 19.40 kg/m    Eyes: Conjunctivae are normal. No scleral icterus.  Neck: Neck supple. No thyromegaly present.  Cardiovascular: Normal rate.   No LE edema.  Pulmonary/Chest: Effort normal. She has rales (scattered, soft).  Lymphadenopathy:    She has no cervical adenopathy.  Neurological: She is alert. She has normal strength.  Skin: Skin is warm and dry. No rash noted.  Psychiatric: She has a normal mood and affect. Her speech is normal and behavior is normal. Cognition and memory are impaired.     Dg Chest 2 View  Result Date: 07/29/2016 CLINICAL DATA:  Chronic cough.  New onset weakness EXAM: CHEST  2 VIEW COMPARISON:  11/17/2014 FINDINGS: The heart size appears normal. There is a small to moderate left pleural effusion which is new from previous exam. Mild interstitial edema. Lungs are hyperinflated with  reticular and nodular interstitial opacities noted bilaterally. IMPRESSION: 1. New left pleural effusion and mild interstitial edema suspicious for CHF. 2. Chronic interstitial opacities, similar to previous examinations. Electronically Signed   By: Kerby Moors M.D.   On: 07/29/2016 17:45        Results for orders placed or performed in visit on 07/29/16  POCT CBC  Result Value Ref Range   WBC 12.7 (A) 4.6 - 10.2 K/uL   Lymph, poc 1.4 0.6 - 3.4   POC LYMPH PERCENT 11.2 10 - 50 %L   MID (cbc) 0.3 0 - 0.9   POC MID % 2.0 0 - 12 %M   POC Granulocyte 11.0 (A) 2 - 6.9   Granulocyte percent 86.8 (A) 37 - 80 %G   RBC 4.49 4.04 - 5.48 M/uL   Hemoglobin 14.3 12.2 - 16.2 g/dL   HCT, POC 41.6 37.7 - 47.9 %   MCV 92.7 80 - 97 fL   MCH, POC 31.9 (A) 27 - 31.2 pg   MCHC 34.4 31.8 - 35.4 g/dL   RDW, POC 13.8 %  Platelet Count, POC 275 142 - 424 K/uL   MPV 7.3 0 - 99.8 fL     She was not able to provide a urine specimen and refused to drink the water offered..   EKG reviewed with Dr. Carlota Raspberry. LBBB does not appear to be new.       Assessment & Plan:   1. Weakness 2. Fatigue, unspecified type Unclear etiology. Suspect UTI, given onset of urine odor with these symptoms, but unable to obtain a urine specimen. Given her age, chronic medical problems (including CKD) and stable vitals, elect against empiric quinolone therapy to cover both lung and urine sources of possible infection, especially given that she is afebrile, continent, not dizzy, not falling, and not SOB. She will collect a urine specimen tonight and bring it in tomorrow for evaluation. If no evidence of infection source there, will need to decide re: CT (renal function will be an issue with contrast), antibiotic therapy, cardiology evaluation. - POCT CBC - EKG 12-Lead - POCT urinalysis dipstick; Future - POCT Microscopic Urinalysis ([facility]); Future  3. Cough 5. Pleural effusion This is not a new cough, but CXR reveals a new  LEFT pleural effusion. This was not present on last film 11/17/2014. This could represent the infection source. No other exam findings suggestive of CHF. EKG appears stable. Would proceed with CT of chest, but for CKD. Consider cardiology evaluation. Unclear how aggressive to be in evaluating these findings in a patient with Alzheimer's without SOB. - DG Chest 2 View; Future  4. Type 2 diabetes mellitus without complication, without long-term current use of insulin ([place]) Has been controlled. Notes with PCP reviewed. As she is overdue for urine microalbumin, and we are collecting a urine, will add this order. - Microalbumin, urine; Future      Fara Chute, PA-C Physician Assistant-Certified [facility]

## 2016-07-29 NOTE — Patient Instructions (Signed)
     IF you received an x-ray today, you will receive an invoice from [facility]. Please contact [facility] at [phone] with questions or concerns regarding your invoice.   IF you received labwork today, you will receive an invoice from [facility]. Please contact [facility] at [phone] with questions or concerns regarding your invoice.   Our billing staff will not be able to assist you with questions regarding bills from these companies.  You will be contacted with the lab results as soon as they are available. The fastest way to get your results is to activate your My Chart account. Instructions are located on the last page of this paperwork. If you have not heard from us regarding the results in 2 weeks, please contact this office.      

## 2016-07-29 NOTE — Progress Notes (Signed)
Subjective:    Patient ID: [name], female    DOB: [birth year], 82 y.o.   MRN: [id number] Chief Complaint  Patient presents with  . Fatigue    no energy    HPI Patient is a 82 yo female with a hx of Alzheimer's Disease who presents today with fatigue x 24 hours. Husband and daughter are at bedside helping to explain patient's symptoms.  History complicated due to patient's Alzheimer's. Husband and daughter report that patient "wasn't feeling herself yesterday". Husband found patient sitting on a chair refusing to go to church. When he tried to help her out of the chair he reported extreme difficulty as the patient had trouble standing. At that point he called his daughter to help move the patient around the house. Both patient and daughter report that the patient has been extremely weak and is unable to move around like she normally does. Both the husband and the daughter were concerned and made an appointment to be seen today.   Upon questioning the patient, she denies any weakness or abnormal behavior, but does admit to some fatigue lately. She attributes her fatigue to the recent trip she had been on to see her granddaughter's wedding on 07/19/16. After the wedding both the patient and her husband went to their [place] house in [place], [place] until 07/25/16, which she reports "was better than our honeymoon due to the peace and quiet". Patient denies fever, chills, diaphoresis, appetite change, and recent sick contacts.   Patient Active Problem List   Diagnosis Date Noted  . Alzheimer's disease 01/04/2016  . Pancreas cyst 07/02/2015  . Asthma, chronic 03/16/2015  . Primary open angle glaucoma of left eye, moderate stage 12/25/2014  . Hypothyroidism 11/17/2014  . Pseudophakia of both eyes 02/15/2013  . Anemia 05/25/2012  . Astigmatism 04/29/2012  . ALLERGIC RHINITIS 10/30/2010  . Asthma 09/27/2010  . HIP PAIN, RIGHT 07/20/2007  . DYSPHAGIA, UNSPECIFIED 07/20/2007  . Venous  (peripheral) insufficiency 04/13/2007  . Diabetes ([place]) 12/18/2006  . HYPERTENSION, BENIGN SYSTEMIC 12/18/2006  . Atherosclerosis of native arteries of extremity with intermittent claudication ([place]) 12/18/2006  . Renal failure 12/18/2006  . OSTEOARTHRITIS OF SPINE, NOS 12/18/2006   Past Medical History:  Diagnosis Date  . Alzheimer's dementia without behavioral disturbance    diagnosed 12/2015; Dr. Jade/neurology  . Anemia   . Asthma   . Blood transfusion 05-24-12  . Blood transfusion without reported diagnosis   . Chronic kidney disease    dr Clover Mealy  . Diabetes mellitus   . Diverticulosis   . DVT (deep venous thrombosis) ([place]) 03/2005   hx.left leg  . Esophageal stricture   . GERD (gastroesophageal reflux disease)   . Glaucoma   . Hiatal hernia 05/07/13  . Hyperlipidemia   . Hypertension   . Osteoarthritis   . Pancreatic duct dilated 05/07/13  . Pulmonary nodules 05/07/13  . PVD (peripheral vascular disease) ([place])   . Tubular adenoma of colon    Family History  Problem Relation Age of Onset  . Adopted: Yes  . Migraines Daughter   . Cancer Son   . Colon cancer Neg Hx    Current Outpatient Prescriptions on File Prior to Visit  Medication Sig Dispense Refill  . Ascorbic Acid (VITAMIN C) 1000 MG tablet Take 1,000 mg by mouth daily.    Marland Kitchen aspirin 81 MG tablet Take 81 mg by mouth daily.    . beclomethasone (QVAR) 40 MCG/ACT inhaler Inhale 2 puffs into the lungs 2 (  two) times daily. 1 Inhaler 12  . brimonidine (ALPHAGAN P) 0.1 % SOLN INSTILL 1 DROP IN LEFT EYE TWICE DAILY.    . calcium carbonate (OS-CAL) 600 MG TABS Take 600 mg by mouth 2 (two) times daily with a meal.    . dextrose (GLUTOSE) 40 % GEL Take 1 Tube by mouth once as needed.     . donepezil (ARICEPT) 10 MG tablet Take 1 tablet (10 mg total) by mouth at bedtime. 30 tablet 3  . famotidine (PEPCID) 20 MG tablet TAKE ONE TABLET AT BEDTIME. 90 tablet 0  . fenofibrate (TRICOR) 48 MG tablet TAKE 1 TABLET ONCE DAILY. 90  tablet 0  . ferrous fumarate (HEMOCYTE - 106 MG FE) 325 (106 FE) MG TABS Take 1 tablet by mouth every evening.     . fluticasone (FLONASE) 50 MCG/ACT nasal spray Place 2 sprays into both nostrils daily. 16 g 11  . levothyroxine (SYNTHROID, LEVOTHROID) 25 MCG tablet TAKE 1 TABLET DAILY BEFORE BREAKFAST 90 tablet 0  . metFORMIN (GLUCOPHAGE) 500 MG tablet TAKE 1 TABLET ONCE DAILY WITH SUPPER. 90 tablet 0  . metoprolol succinate (TOPROL-XL) 50 MG 24 hr tablet TAKE 1 TABLET DAILY AFTER A MEAL. 90 tablet 0  . Multiple Vitamin (MULTIVITAMIN WITH MINERALS) TABS tablet Take 1 tablet by mouth daily.    Marland Kitchen PROAIR HFA 108 (90 Base) MCG/ACT inhaler USE 2 PUFFS EVERY 6 HOURS AS NEEDED FOR SHORTNESS OF BREATH. 17 g 0   No current facility-administered medications on file prior to visit.     Review of Systems All ROS are negative except those stated above     Objective:   Physical Exam  Constitutional: She appears well-developed. She appears cachectic.  Non-toxic appearance. She does not have a sickly appearance. No distress.  HENT:  Head: Normocephalic and atraumatic.  Right Ear: External ear normal.  Left Ear: External ear normal.  Mouth/Throat: No oropharyngeal exudate.  Eyes: Conjunctivae are normal. Pupils are equal, round, and reactive to light. Right eye exhibits no discharge. Left eye exhibits no discharge.  Neck: Neck supple. No thyromegaly present.  Cardiovascular: Normal rate, regular rhythm, normal heart sounds and intact distal pulses.  Exam reveals no gallop and no friction rub.   No murmur heard. Pulmonary/Chest: Effort normal. No respiratory distress. She has rales.  Abdominal: Soft. Bowel sounds are normal. She exhibits no distension. There is no tenderness.  Musculoskeletal: She exhibits no edema or tenderness.  Lymphadenopathy:    She has no cervical adenopathy.  Neurological: She is alert. She displays normal reflexes.  Skin: Skin is warm and dry. No rash noted. She is not  diaphoretic.  Psychiatric: She has a normal mood and affect. Her behavior is normal.   BP (!) 146/68 (BP Location: Right Arm, Patient Position: Sitting, Cuff Size: Normal)   Pulse 61   Temp 97.6 F (36.4 C) (Oral)   Resp 18   Ht 5' 4.5" (1.638 m)   Wt 114 lb 12.8 oz (52.1 kg)   SpO2 94%   BMI 19.40 kg/m   Results for orders placed or performed in visit on 07/29/16  POCT CBC  Result Value Ref Range   WBC 12.7 (A) 4.6 - 10.2 K/uL   Lymph, poc 1.4 0.6 - 3.4   POC LYMPH PERCENT 11.2 10 - 50 %L   MID (cbc) 0.3 0 - 0.9   POC MID % 2.0 0 - 12 %M   POC Granulocyte 11.0 (A) 2 - 6.9   Granulocyte  percent 86.8 (A) 37 - 80 %G   RBC 4.49 4.04 - 5.48 M/uL   Hemoglobin 14.3 12.2 - 16.2 g/dL   HCT, POC 41.6 37.7 - 47.9 %   MCV 92.7 80 - 97 fL   MCH, POC 31.9 (A) 27 - 31.2 pg   MCHC 34.4 31.8 - 35.4 g/dL   RDW, POC 13.8 %   Platelet Count, POC 275 142 - 424 K/uL   MPV 7.3 0 - 99.8 fL   Dg Chest 2 View  Result Date: 07/29/2016 CLINICAL DATA:  Chronic cough.  New onset weakness EXAM: CHEST  2 VIEW COMPARISON:  11/17/2014 FINDINGS: The heart size appears normal. There is a small to moderate left pleural effusion which is new from previous exam. Mild interstitial edema. Lungs are hyperinflated with reticular and nodular interstitial opacities noted bilaterally. IMPRESSION: 1. New left pleural effusion and mild interstitial edema suspicious for CHF. 2. Chronic interstitial opacities, similar to previous examinations. Electronically Signed   By: Kerby Moors M.D.   On: 07/29/2016 17:45       Assessment & Plan:  1. Weakness 2. Fatigue, unspecified type Etiology unknown. Suspicious for UTI. Most likely underlying infection due to increased WBC. Unable to collect urine specimen today, sent patient home with urine cup and hat to collect  Urine specimen at home and bring in tomorrow.  - POCT CBC - POCT Microscopic Urinalysis ([facility]) - POCT urinalysis dipstick  3. Cough Shows left sided  pleural effusion with suspicion of CHF, will order EKG - DG Chest 2 View; Future  4. Type 2 diabetes mellitus without complication, without long-term current use of insulin ([facility]) - Microalbumin, urine  EKG Findings reviewed Non-specific findings when compared to old EKG done on 11/17/2014 New EKG showed

## 2016-07-30 ENCOUNTER — Other Ambulatory Visit: Payer: Self-pay

## 2016-07-30 ENCOUNTER — Telehealth: Payer: Self-pay | Admitting: Physician Assistant

## 2016-07-30 ENCOUNTER — Telehealth: Payer: Self-pay

## 2016-07-30 DIAGNOSIS — R8290 Unspecified abnormal findings in urine: Secondary | ICD-10-CM | POA: Diagnosis not present

## 2016-07-30 DIAGNOSIS — E119 Type 2 diabetes mellitus without complications: Secondary | ICD-10-CM | POA: Diagnosis not present

## 2016-07-30 LAB — POC MICROSCOPIC URINALYSIS (UMFC): MUCUS RE: ABSENT

## 2016-07-30 LAB — POCT URINALYSIS DIP (MANUAL ENTRY)
BILIRUBIN UA: NEGATIVE
GLUCOSE UA: NEGATIVE
Leukocytes, UA: NEGATIVE
NITRITE UA: NEGATIVE
Protein Ur, POC: 30 — AB
RBC UA: NEGATIVE
Spec Grav, UA: 1.02
Urobilinogen, UA: 1
pH, UA: 6.5

## 2016-07-30 NOTE — Addendum Note (Signed)
Addended by: Jannette Spanner on: 07/30/2016 01:49 PM   Modules accepted: Orders

## 2016-07-30 NOTE — Telephone Encounter (Signed)
Called to inquire regarding the urine specimen planned for today. Per Mr. Sterman, the urine specimen is here. I have not yet seen the result and will inquire with our lab staff.  Urine dip and micro are notable for moderate bacteria, but no WBC or RBC. Tech noted a very strong, abnormal odor. Will proceed with urine culture.

## 2016-07-30 NOTE — Addendum Note (Signed)
Addended by: Jannette Spanner on: 07/30/2016 01:58 PM   Modules accepted: Orders

## 2016-07-30 NOTE — Telephone Encounter (Signed)
PATIENT'S HUSBAND (TOM) WOULD LIKE CHELLE TO KNOW THAT HE DROPPED OFF A URINE SPECIMEN FOR HIS WIFE TODAY AND HE WOULD LIKE TO GET THE RESULTS. HE SAID SHE WAS IN THE OFFICE YESTERDAY 07/29/2016 AND SAW CHELLE. BEST PHONE [phone] (HUSBAND'S NAME IS TOM Brunkow) [place]

## 2016-07-31 LAB — MICROALBUMIN, URINE: Microalb, Ur: 7.3 mg/dL

## 2016-07-31 LAB — URINE CULTURE: Colony Count: >=100000

## 2016-07-31 MED ORDER — AZITHROMYCIN 250 MG PO TABS: ORAL_TABLET | ORAL | 0 refills | Status: DC

## 2016-07-31 NOTE — Telephone Encounter (Signed)
Urine specimen reviewed. Bacteria noted, but no WBC or RBC. No epithelial cells. Await UCx results.  Please touch base with the patient's husband to let him know, and to find out how the patient is doing.

## 2016-07-31 NOTE — Telephone Encounter (Signed)
I think that since her symptoms persist, we should go ahead and treat her with an antibiotic. I was hoping to wait until we got the urine culture back, so that we would know if we needed to pick one that would cover that infection, too.  However, I'll go ahead and send in an antibiotic. When we see if she responds to the antibiotic, then we need to decide if we want/need to do a scan to evaluate the fluid in the lungs, but the contrast can damage her kidneys, so I'd prefer to wait to see if the antibiotic helps.

## 2016-07-31 NOTE — Telephone Encounter (Signed)
In process 

## 2016-07-31 NOTE — Telephone Encounter (Signed)
Spoke to [employer].  He stated that [name] is still very lethargic and unable to get up and down the stairs to go to bed, therefore, she is sleeping on the sofa downstairs each night.  He is wondering what we could do to get her a hospital bed.  Also, he inquired about the fluid in her lungs and what treatment she would need for that.  Please advise, thank you!

## 2016-08-01 ENCOUNTER — Other Ambulatory Visit: Payer: Self-pay

## 2016-08-01 ENCOUNTER — Telehealth: Payer: Self-pay

## 2016-08-01 NOTE — Telephone Encounter (Signed)
Spoke with pts husband. He did not know that an antibiotic had been prescribed. I told him that it was and dtat Dr. Tamala Julian wanted to see her on Saturday.

## 2016-08-01 NOTE — Telephone Encounter (Signed)
I spoke with Dr. Tamala Julian.  We would like to see the patient back in the office on Saturday. Please help them schedule a visit with Dr. Tamala Julian for that day.  At that visit, we will repeat the CBC, to see how that is improving on the antibiotic, and discuss whether or not we need to pursue the CT scan to further evaluate the pleural effusion.

## 2016-08-05 NOTE — Telephone Encounter (Signed)
Patient did not come in to see Dr. Tamala Julian on Saturday.  Please contact the husband or daughter to get an update, and to arrange for when they will follow-up with Dr. Tamala Julian.

## 2016-08-06 ENCOUNTER — Telehealth: Payer: Self-pay | Admitting: Emergency Medicine

## 2016-08-06 ENCOUNTER — Ambulatory Visit: Payer: Medicare Other

## 2016-08-06 ENCOUNTER — Ambulatory Visit (INDEPENDENT_AMBULATORY_CARE_PROVIDER_SITE_OTHER): Payer: Medicare Other | Admitting: Family Medicine

## 2016-08-06 ENCOUNTER — Ambulatory Visit (INDEPENDENT_AMBULATORY_CARE_PROVIDER_SITE_OTHER): Payer: Medicare Other

## 2016-08-06 VITALS — BP 176/72 | HR 55 | Temp 97.6°F | Resp 18 | Wt 112.6 lb

## 2016-08-06 DIAGNOSIS — S8002XA Contusion of left knee, initial encounter: Secondary | ICD-10-CM | POA: Diagnosis not present

## 2016-08-06 DIAGNOSIS — I70213 Atherosclerosis of native arteries of extremities with intermittent claudication, bilateral legs: Secondary | ICD-10-CM | POA: Diagnosis not present

## 2016-08-06 DIAGNOSIS — J90 Pleural effusion, not elsewhere classified: Secondary | ICD-10-CM

## 2016-08-06 DIAGNOSIS — R0789 Other chest pain: Secondary | ICD-10-CM

## 2016-08-06 DIAGNOSIS — S2232XA Fracture of one rib, left side, initial encounter for closed fracture: Secondary | ICD-10-CM

## 2016-08-06 NOTE — Patient Instructions (Addendum)
Your x-rays do indicate a small pleural effusion or still fluid within the lungs, but this appears to be slightly improved from previous x-ray. There is a seventh rib fracture that may be explaining some of your discomfort, and some of the fluid on that left side may also be due to initial injury. The antibiotic that was just finished yesterday will continue to work for the next 4-5 days. Tylenol is safest over-the-counter for treatment of pain, but if you need something stronger, let us know. Make sure you are taking deep breaths throughout the day to lessen risk of fluid collection in your lungs from that rib fracture.  Recheck with Dr. Tamala Julian within the next 1 week, sooner if worse  Pleural Effusion A pleural effusion is an abnormal buildup of fluid in the layers of tissue between your lungs and the inside of your chest (pleural space). These two layers of tissue that line both your lungs and the inside of your chest are called pleura. Usually, there is no air in the space between the pleura, only a thin layer of fluid. If left untreated, a large amount of fluid can build up and cause the lung to collapse. A pleural effusion is usually caused by another disease that requires treatment. The two main types of pleural effusion are:  Transudative pleural effusion. This happens when fluid leaks into the pleural space because of a low protein count in your blood or high blood pressure in your vessels. Heart failure often causes this.  Exudative infusion. This occurs when fluid collects in the pleural space from blocked blood vessels or lymph vessels. Some lung diseases, injuries, and cancers can cause this type of effusion. CAUSES Pleural effusion can be caused by:  Heart failure.  A blood clot in the lung (pulmonary embolism).  Pneumonia.  Cancer.  Liver failure (cirrhosis).  Kidney disease.  Complications from surgery, such as from open heart surgery. SIGNS AND SYMPTOMS In some cases,  pleural effusion may cause no symptoms. Symptoms can include:  Shortness of breath, especially when lying down.  Chest pain, often worse when taking a deep breath.  Fever.  Dry cough that is lasting (chronic).  Hiccups.  Rapid breathing. An underlying condition that is causing the pleural effusion (such as heart failure, pneumonia, blood clots, tuberculosis, or cancer) may also cause additional symptoms. DIAGNOSIS Your health care provider may suspect pleural effusion based on your symptoms and medical history. Your health care provider will also do a physical exam and a chest X-ray. If the X-ray shows there is fluid in your chest, you may need to have this fluid removed using a needle (thoracentesis) so it can be tested. You may also have:  Imaging studies of the chest, such as:  Ultrasound.  CT scan.  Blood tests for kidney and liver function. TREATMENT Treatment depends on the cause of the pleural effusion. Treatment may include:  Taking antibiotic medicines to clear up an infection that is causing the pleural effusion.  Placing a tube in the chest to drain the effusion (tube thoracostomy). This procedure is often used when there is an infection in the fluid.  Surgery to remove the fibrous outer layer of tissue from the pleural space (decortication).  Thoracentesis, which can improve cough and shortness of breath.  A procedure to put medicine into the chest cavity to seal the pleural space to prevent fluid buildup (pleurodesis).  Chemotherapy and radiation therapy. These may be required in the case of cancerous (malignant) pleural effusion. HOME  CARE INSTRUCTIONS  Take medicines only as directed by your health care provider.  Keep track of how long you can gently exercise before you get short of breath. Try simply walking at first.  Do not use any tobacco products, including cigarettes, chewing tobacco, or electronic cigarettes. If you need help quitting, ask your  health care provider.  Keep all follow-up visits as directed by your health care provider. This is important. SEEK MEDICAL CARE IF:  The amount of time that you are able to exercise decreases or does not improve with time.  You have pain or signs of infection at the puncture site if you had thoracentesis. Watch for:  Drainage.  Redness.  Swelling.  You have a fever. SEEK IMMEDIATE MEDICAL CARE IF:  You are short of breath.  You develop chest pain.  You develop a new cough. MAKE SURE YOU:  Understand these instructions.  Will watch your condition.  Will get help right away if you are not doing well or get worse.   This information is not intended to replace advice given to you by your health care provider. Make sure you discuss any questions you have with your health care provider.   Document Released: 10/07/2005 Document Revised: 10/28/2014 Document Reviewed: 03/02/2014 Elsevier Interactive Patient Education 2016 [place].  Rib Fracture A rib fracture is a break or crack in one of the bones of the ribs. The ribs are a group of long, curved bones that wrap around your chest and attach to your spine. They protect your lungs and other organs in the chest cavity. A broken or cracked rib is often painful, but most do not cause other problems. Most rib fractures heal on their own over time. However, rib fractures can be more serious if multiple ribs are broken or if broken ribs move out of place and push against other structures. CAUSES   A direct blow to the chest. For example, this could happen during contact sports, a car accident, or a fall against a hard object.  Repetitive movements with high force, such as pitching a baseball or having severe coughing spells. SYMPTOMS   Pain when you breathe in or cough.  Pain when someone presses on the injured area. DIAGNOSIS  Your caregiver will perform a physical exam. Various imaging tests may be ordered to confirm the  diagnosis and to look for related injuries. These tests may include a chest X-ray, computed tomography (CT), magnetic resonance imaging (MRI), or a bone scan. TREATMENT  Rib fractures usually heal on their own in 1-3 months. The longer healing period is often associated with a continued cough or other aggravating activities. During the healing period, pain control is very important. Medication is usually given to control pain. Hospitalization or surgery may be needed for more severe injuries, such as those in which multiple ribs are broken or the ribs have moved out of place.  HOME CARE INSTRUCTIONS   Avoid strenuous activity and any activities or movements that cause pain. Be careful during activities and avoid bumping the injured rib.  Gradually increase activity as directed by your caregiver.  Only take over-the-counter or prescription medications as directed by your caregiver. Do not take other medications without asking your caregiver first.  Apply ice to the injured area for the first 1-2 days after you have been treated or as directed by your caregiver. Applying ice helps to reduce inflammation and pain.  Put ice in a plastic bag.  Place a towel between your skin  and the bag.   Leave the ice on for 15-20 minutes at a time, every 2 hours while you are awake.  Perform deep breathing as directed by your caregiver. This will help prevent pneumonia, which is a common complication of a broken rib. Your caregiver may instruct you to:  Take deep breaths several times a day.  Try to cough several times a day, holding a pillow against the injured area.  Use a device called an incentive spirometer to practice deep breathing several times a day.  Drink enough fluids to keep your urine clear or pale yellow. This will help you avoid constipation.   Do not wear a rib belt or binder. These restrict breathing, which can lead to pneumonia.  SEEK IMMEDIATE MEDICAL CARE IF:   You have a fever.    You have difficulty breathing or shortness of breath.   You develop a continual cough, or you cough up thick or bloody sputum.  You feel sick to your stomach (nausea), throw up (vomit), or have abdominal pain.   You have worsening pain not controlled with medications.  MAKE SURE YOU:  Understand these instructions.  Will watch your condition.  Will get help right away if you are not doing well or get worse.   This information is not intended to replace advice given to you by your health care provider. Make sure you discuss any questions you have with your health care provider.   Document Released: 10/07/2005 Document Revised: 06/09/2013 Document Reviewed: 12/09/2012 Elsevier Interactive Patient Education 2016 [employer].   IF you received an x-ray today, you will receive an invoice from [facility]. Please contact [facility] at [phone] with questions or concerns regarding your invoice.   IF you received labwork today, you will receive an invoice from [employer]. Please contact [facility] at [phone] with questions or concerns regarding your invoice.   Our billing staff will not be able to assist you with questions regarding bills from these companies.  You will be contacted with the lab results as soon as they are available. The fastest way to get your results is to activate your My Chart account. Instructions are located on the last page of this paperwork. If you have not heard from [place] regarding the results in 2 weeks, please contact this office.

## 2016-08-06 NOTE — Progress Notes (Signed)
By signing my name below, I, [name], attest that this documentation has been prepared under the direction and in the presence of [name], MD.  Electronically Signed: [name], Medical Scribe. 08/06/16. 3:56 PM.  Subjective:    Patient ID: [name], female    DOB: Sep 07, 1931, 82 y.o.   MRN: [id number]  HPI Chief Complaint  Patient presents with  . Knee Pain    from a fall 8 days ago  . Flank Pain    Lt Flank / Ribs pain from the fall    HPI Comments: Malary Clements is a 82 y.o. female who was brought in by her husband presents to the [facility] complaining of follow-up. Pt has a PMHx of multiple medical problems including DM, renal disease, alzheimer's, hypothyroidism, asthma. PCP is [name],[name], MD   She was most recently seen 8 days ago for weakness and generalized malaise. She had been using her cane more for ambulation. She was not having dizziness or falls at that time. She did have a new pleural effusion on the left, not present Jan 2016, however not SOB. Telephone note the following day:  urine culture was obtained,  planned for repeat visit 3 days ago with PCP Dr. Tamala Julian for repeat CBC and to discuss the need for CT scan of pleural effusion.   She was treated with Azithromycin for possible lung infection but has not returned to clinic since Oct 9th. Prior phone notes reviewed.   Now here for left knee pain and flank pain after she fell over a box 8 days ago. Her husband states she hasn't been wanting to walk and pt mentions her left flank "tingles". Initial front of left knee pain, now improving. No swelling and able to weight bear on that knee.   Pt finished Azithromycin yesterday and her "coughing has subsided a little". Pt has been having more weakness starting 10-12 days ago and has been sleeping 14 hours a day onset 2.5 - 3 weeks ago. Pt was found on the floor 8 days ago when her husband came home from work. Pt's husband works all day  and he states she sleeps all day, but pt states she works in the yard. Pt has been eating, drinking, having regular bowel movement, and having regular urine output. Pt denies odor to urine, SOB, and wheezing.   Patient Active Problem List   Diagnosis Date Noted  . Alzheimer's disease 01/04/2016  . Pancreas cyst 07/02/2015  . Asthma, chronic 03/16/2015  . Primary open angle glaucoma of left eye, moderate stage 12/25/2014  . Hypothyroidism 11/17/2014  . Pseudophakia of both eyes 02/15/2013  . Anemia 05/25/2012  . Astigmatism 04/29/2012  . ALLERGIC RHINITIS 10/30/2010  . Asthma 09/27/2010  . HIP PAIN, RIGHT 07/20/2007  . DYSPHAGIA, UNSPECIFIED 07/20/2007  . Venous (peripheral) insufficiency 04/13/2007  . Diabetes ([place]) 12/18/2006  . HYPERTENSION, BENIGN SYSTEMIC 12/18/2006  . Atherosclerosis of native arteries of extremity with intermittent claudication ([place]) 12/18/2006  . Renal failure 12/18/2006  . OSTEOARTHRITIS OF SPINE, NOS 12/18/2006   Past Medical History:  Diagnosis Date  . Alzheimer's dementia without behavioral disturbance    diagnosed 12/2015; Dr. Jade/neurology  . Anemia   . Asthma   . Blood transfusion 05-24-12  . Blood transfusion without reported diagnosis   . Chronic kidney disease    dr [name]  . Diabetes mellitus   . Diverticulosis   . DVT (deep venous thrombosis) ([place]) 03/2005   hx.left leg  . Esophageal  stricture   . GERD (gastroesophageal reflux disease)   . Glaucoma   . Hiatal hernia 05/07/13  . Hyperlipidemia   . Hypertension   . Osteoarthritis   . Pancreatic duct dilated 05/07/13  . Pulmonary nodules 05/07/13  . PVD (peripheral vascular disease) ([place])   . Tubular adenoma of colon    Past Surgical History:  Procedure Laterality Date  . CATARACT EXTRACTION, BILATERAL    . COLONOSCOPY  05/27/2012   Procedure: COLONOSCOPY;  Surgeon: Lafayette Dragon, MD;  Location: [facility];  Service: Endoscopy;  Laterality: N/A;  . ESOPHAGOGASTRODUODENOSCOPY   05/26/2012   Procedure: ESOPHAGOGASTRODUODENOSCOPY (EGD);  Surgeon: Lafayette Dragon, MD;  Location: [facility];  Service: Endoscopy;  Laterality: N/A;  . EUS N/A 07/08/2013   Procedure: UPPER ENDOSCOPIC ULTRASOUND (EUS) LINEAR;  Surgeon: Milus Banister, MD;  Location: [facility];  Service: Endoscopy;  Laterality: N/A;  need side view scope  . EYE SURGERY    . VASCULAR SURGERY  09/2004  . WRIST FRACTURE SURGERY     R wrist fracture, plate   No Known Allergies Prior to Admission medications   Medication Sig Start Date End Date Taking? Authorizing Provider  Ascorbic Acid (VITAMIN C) 1000 MG tablet Take 1,000 mg by mouth daily.   Yes Historical Provider, MD  aspirin 81 MG tablet Take 81 mg by mouth daily.   Yes Historical Provider, MD  azithromycin (ZITHROMAX) 250 MG tablet Take 2 tabs PO x 1 dose, then 1 tab PO QD x 4 days 07/31/16  Yes Chelle Jeffery, PA-C  beclomethasone (QVAR) 40 MCG/ACT inhaler Inhale 2 puffs into the lungs 2 (two) times daily. 05/28/16  Yes Wardell Honour, MD  brimonidine (ALPHAGAN P) 0.1 % SOLN INSTILL 1 DROP IN LEFT EYE TWICE DAILY. 04/24/16  Yes Historical Provider, MD  calcium carbonate (OS-CAL) 600 MG TABS Take 600 mg by mouth 2 (two) times daily with a meal.   Yes Historical Provider, MD  dextrose (GLUTOSE) 40 % GEL Take 1 Tube by mouth once as needed.    Yes Historical Provider, MD  donepezil (ARICEPT) 10 MG tablet Take 1 tablet (10 mg total) by mouth at bedtime. 05/28/16  Yes Wardell Honour, MD  famotidine (PEPCID) 20 MG tablet TAKE ONE TABLET AT BEDTIME. 05/10/16  Yes Wardell Honour, MD  fenofibrate (TRICOR) 48 MG tablet TAKE 1 TABLET ONCE DAILY. 06/21/16  Yes Wardell Honour, MD  ferrous fumarate (HEMOCYTE - 106 MG FE) 325 (106 FE) MG TABS Take 1 tablet by mouth every evening.    Yes Historical Provider, MD  fluticasone (FLONASE) 50 MCG/ACT nasal spray Place 2 sprays into both nostrils daily. 05/28/16  Yes Wardell Honour, MD  levothyroxine (SYNTHROID, LEVOTHROID) 25 MCG  tablet TAKE 1 TABLET DAILY BEFORE BREAKFAST 06/12/15  Yes Chelle Jeffery, PA-C  metFORMIN (GLUCOPHAGE) 500 MG tablet TAKE 1 TABLET ONCE DAILY WITH SUPPER. 06/21/16  Yes Wardell Honour, MD  metoprolol succinate (TOPROL-XL) 50 MG 24 hr tablet TAKE 1 TABLET DAILY AFTER A MEAL. 06/25/16  Yes Wardell Honour, MD  Multiple Vitamin (MULTIVITAMIN WITH MINERALS) TABS tablet Take 1 tablet by mouth daily.   Yes Historical Provider, MD  PROAIR HFA 108 [phone] Base) MCG/ACT inhaler USE 2 PUFFS EVERY 6 HOURS AS NEEDED FOR SHORTNESS OF BREATH. 06/11/16  Yes Wardell Honour, MD   Social History   Social History  . Marital status: Married    Spouse name: N/A  . Number of children: N/A  .  Years of education: N/A   Occupational History  . Not on file.   Social History Main Topics  . Smoking status: Never Smoker  . Smokeless tobacco: Never Used  . Alcohol use No  . Drug use: No  . Sexual activity: Not on file   Other Topics Concern  . Not on file   Social History Narrative   Marital status: married x 60 years      Children:  4 children; 11 grandchildren; 2 gg      Lives: with husband, dog      Employment: Retired      Tobacco: never      Alcohol: none      Exercise:  Walking daily short distances; previous Firefighter      ADLs:  Walks with cane; cleans and cooks; drives.     Born: [place], [place]   Has 2 years of college.    Review of Systems  Constitutional: Positive for fatigue. Negative for appetite change.  Respiratory: Positive for cough. Negative for shortness of breath and wheezing.   Gastrointestinal: Negative for constipation and diarrhea.  Genitourinary: Negative for difficulty urinating, frequency and urgency.  Skin: Negative for color change and wound.  Neurological: Positive for weakness.   Objective:  Physical Exam  Constitutional: She appears well-developed and well-nourished. No distress.  HENT:  Head: Normocephalic and atraumatic.  Eyes: Conjunctivae are normal.  Neck: Neck  supple.  Cardiovascular: Normal rate.   Pulmonary/Chest: Effort normal. No respiratory distress (speaking full sentences). She has rhonchi in the left lower field.  Minimal posterior chest wall. Skin intact, no bruising  Abdominal: Soft. There is no tenderness.  Minimal discomfort over left lower ribs Minimal posterior chest wall  Musculoskeletal:  Left knee: Pain free ROM. No effusion. No erythema skin intact No ecchymosis Minimal discomfort over the tiburcle  Neurological: She is alert.  Skin: Skin is warm, dry and intact. No bruising noted.  Psychiatric: She has a normal mood and affect. Her behavior is normal.  Nursing note and vitals reviewed.  BP (!) 176/72 (BP Location: Left Arm, Patient Position: Sitting, Cuff Size: Normal)   Pulse (!) 55   Temp 97.6 F (36.4 C) (Oral)   Resp 18   Wt 112 lb 9.6 oz (51.1 kg)   SpO2 95%   BMI 19.03 kg/m    Dg Chest 2 View  Result Date: 08/06/2016 CLINICAL DATA:  Acute onset of left-sided rib pain, status post fall. Initial encounter. EXAM: CHEST  2 VIEW COMPARISON:  Chest radiograph performed 07/29/2016 FINDINGS: The lungs are hyperexpanded, with flattening of the hemidiaphragms, compatible with COPD. Chronic peribronchial thickening and increased interstitial markings are noted. A small left pleural effusion is again seen, mildly decreased in size from the prior study. There is no evidence of pneumothorax. The heart is normal in size; the mediastinal contour is within normal limits. A mildly displaced fracture of the left posterior lateral seventh rib is better characterized on concurrent rib radiographs. IMPRESSION: 1. Small left pleural effusion has decreased mildly in size from the prior study. 2. Findings of COPD, with chronic peribronchial thickening and increased interstitial markings. 3. Mildly displaced fracture of the left posterolateral seventh rib is better characterized on concurrent left rib radiographs. Electronically Signed   By:  Garald Balding M.D.   On: 08/06/2016 16:42   Dg Ribs Unilateral W/chest Left  Result Date: 08/06/2016 CLINICAL DATA:  Status post fall 8 days ago, with left-sided rib pain. Initial encounter. EXAM: LEFT RIBS -  2+ VIEW COMPARISON:  Chest radiograph performed 07/29/2016 FINDINGS: There is a mildly displaced fracture of the left posterolateral seventh rib. No additional fractures are seen. A small left pleural effusion is again noted. The cardiomediastinal silhouette is grossly unremarkable. The lungs are hyperexpanded, as previously seen. IMPRESSION: 1. Mildly displaced fracture of the left posterolateral seventh rib. 2. Small left pleural effusion again noted. 3. Underlying COPD. Electronically Signed   By: Garald Balding M.D.   On: 08/06/2016 16:46   Assessment & Plan:   Almina Dalsing is a 82 y.o. female Pleural effusion, left - Plan: DG Chest 2 View, DG Ribs Unilateral W/Chest Left Left-sided chest wall pain - Plan: DG Chest 2 View, DG Ribs Unilateral W/Chest Left Closed fracture of one rib of left side, initial encounter  - now noted 7th rib fracture on left, may be cause of pleural effusion/possible pulmonary contusion.  Fluid is stable, and small effusion that is decreasing in size.   -just finished Zpak, and no indication of pneumonia or infection at this time.   -overall minimal pain, can start with tylenol otc, but importance of episodic deep breathing discussed. rtc precautions and call if stronger med needed.   -recheck with PCP in next 1 week, RTC/ER precautions.   Contusion of left knee, initial encounter  - only over tibial tubercle, minimal ttp, no effusion and good rom. Cont symptomatic care, RTC precautions.    No orders of the defined types were placed in this encounter.  Patient Instructions     Your x-rays do indicate a small pleural effusion or still fluid within the lungs, but this appears to be slightly improved from previous x-ray. There is a seventh rib fracture that  may be explaining some of your discomfort, and some of the fluid on that left side may also be due to initial injury. The antibiotic that was just finished yesterday will continue to work for the next 4-5 days. Tylenol is safest over-the-counter for treatment of pain, but if you need something stronger, let us know. Make sure you are taking deep breaths throughout the day to lessen risk of fluid collection in your lungs from that rib fracture.  Recheck with Dr. Tamala Julian within the next 1 week, sooner if worse  Pleural Effusion A pleural effusion is an abnormal buildup of fluid in the layers of tissue between your lungs and the inside of your chest (pleural space). These two layers of tissue that line both your lungs and the inside of your chest are called pleura. Usually, there is no air in the space between the pleura, only a thin layer of fluid. If left untreated, a large amount of fluid can build up and cause the lung to collapse. A pleural effusion is usually caused by another disease that requires treatment. The two main types of pleural effusion are:  Transudative pleural effusion. This happens when fluid leaks into the pleural space because of a low protein count in your blood or high blood pressure in your vessels. Heart failure often causes this.  Exudative infusion. This occurs when fluid collects in the pleural space from blocked blood vessels or lymph vessels. Some lung diseases, injuries, and cancers can cause this type of effusion. CAUSES Pleural effusion can be caused by:  Heart failure.  A blood clot in the lung (pulmonary embolism).  Pneumonia.  Cancer.  Liver failure (cirrhosis).  Kidney disease.  Complications from surgery, such as from open heart surgery. SIGNS AND SYMPTOMS In some cases, pleural effusion  may cause no symptoms. Symptoms can include:  Shortness of breath, especially when lying down.  Chest pain, often worse when taking a deep breath.  Fever.  Dry  cough that is lasting (chronic).  Hiccups.  Rapid breathing. An underlying condition that is causing the pleural effusion (such as heart failure, pneumonia, blood clots, tuberculosis, or cancer) may also cause additional symptoms. DIAGNOSIS Your health care provider may suspect pleural effusion based on your symptoms and medical history. Your health care provider will also do a physical exam and a chest X-ray. If the X-ray shows there is fluid in your chest, you may need to have this fluid removed using a needle (thoracentesis) so it can be tested. You may also have:  Imaging studies of the chest, such as:  Ultrasound.  CT scan.  Blood tests for kidney and liver function. TREATMENT Treatment depends on the cause of the pleural effusion. Treatment may include:  Taking antibiotic medicines to clear up an infection that is causing the pleural effusion.  Placing a tube in the chest to drain the effusion (tube thoracostomy). This procedure is often used when there is an infection in the fluid.  Surgery to remove the fibrous outer layer of tissue from the pleural space (decortication).  Thoracentesis, which can improve cough and shortness of breath.  A procedure to put medicine into the chest cavity to seal the pleural space to prevent fluid buildup (pleurodesis).  Chemotherapy and radiation therapy. These may be required in the case of cancerous (malignant) pleural effusion. HOME CARE INSTRUCTIONS  Take medicines only as directed by your health care provider.  Keep track of how long you can gently exercise before you get short of breath. Try simply walking at first.  Do not use any tobacco products, including cigarettes, chewing tobacco, or electronic cigarettes. If you need help quitting, ask your health care provider.  Keep all follow-up visits as directed by your health care provider. This is important. SEEK MEDICAL CARE IF:  The amount of time that you are able to exercise  decreases or does not improve with time.  You have pain or signs of infection at the puncture site if you had thoracentesis. Watch for:  Drainage.  Redness.  Swelling.  You have a fever. SEEK IMMEDIATE MEDICAL CARE IF:  You are short of breath.  You develop chest pain.  You develop a new cough. MAKE SURE YOU:  Understand these instructions.  Will watch your condition.  Will get help right away if you are not doing well or get worse.   This information is not intended to replace advice given to you by your health care provider. Make sure you discuss any questions you have with your health care provider.   Document Released: 10/07/2005 Document Revised: 10/28/2014 Document Reviewed: 03/02/2014 Elsevier Interactive Patient Education 2016 [place].  Rib Fracture A rib fracture is a break or crack in one of the bones of the ribs. The ribs are a group of long, curved bones that wrap around your chest and attach to your spine. They protect your lungs and other organs in the chest cavity. A broken or cracked rib is often painful, but most do not cause other problems. Most rib fractures heal on their own over time. However, rib fractures can be more serious if multiple ribs are broken or if broken ribs move out of place and push against other structures. CAUSES   A direct blow to the chest. For example, this could happen during contact  sports, a car accident, or a fall against a hard object.  Repetitive movements with high force, such as pitching a baseball or having severe coughing spells. SYMPTOMS   Pain when you breathe in or cough.  Pain when someone presses on the injured area. DIAGNOSIS  Your caregiver will perform a physical exam. Various imaging tests may be ordered to confirm the diagnosis and to look for related injuries. These tests may include a chest X-ray, computed tomography (CT), magnetic resonance imaging (MRI), or a bone scan. TREATMENT  Rib fractures  usually heal on their own in 1-3 months. The longer healing period is often associated with a continued cough or other aggravating activities. During the healing period, pain control is very important. Medication is usually given to control pain. Hospitalization or surgery may be needed for more severe injuries, such as those in which multiple ribs are broken or the ribs have moved out of place.  HOME CARE INSTRUCTIONS   Avoid strenuous activity and any activities or movements that cause pain. Be careful during activities and avoid bumping the injured rib.  Gradually increase activity as directed by your caregiver.  Only take over-the-counter or prescription medications as directed by your caregiver. Do not take other medications without asking your caregiver first.  Apply ice to the injured area for the first 1-2 days after you have been treated or as directed by your caregiver. Applying ice helps to reduce inflammation and pain.  Put ice in a plastic bag.  Place a towel between your skin and the bag.   Leave the ice on for 15-20 minutes at a time, every 2 hours while you are awake.  Perform deep breathing as directed by your caregiver. This will help prevent pneumonia, which is a common complication of a broken rib. Your caregiver may instruct you to:  Take deep breaths several times a day.  Try to cough several times a day, holding a pillow against the injured area.  Use a device called an incentive spirometer to practice deep breathing several times a day.  Drink enough fluids to keep your urine clear or pale yellow. This will help you avoid constipation.   Do not wear a rib belt or binder. These restrict breathing, which can lead to pneumonia.  SEEK IMMEDIATE MEDICAL CARE IF:   You have a fever.   You have difficulty breathing or shortness of breath.   You develop a continual cough, or you cough up thick or bloody sputum.  You feel sick to your stomach (nausea), throw up  (vomit), or have abdominal pain.   You have worsening pain not controlled with medications.  MAKE SURE YOU:  Understand these instructions.  Will watch your condition.  Will get help right away if you are not doing well or get worse.   This information is not intended to replace advice given to you by your health care provider. Make sure you discuss any questions you have with your health care provider.   Document Released: 10/07/2005 Document Revised: 06/09/2013 Document Reviewed: 12/09/2012 Elsevier Interactive Patient Education 2016 [employer].   IF you received an x-ray today, you will receive an invoice from [facility]. Please contact [facility] at [phone] with questions or concerns regarding your invoice.   IF you received labwork today, you will receive an invoice from [employer]. Please contact [facility] at [phone] with questions or concerns regarding your invoice.   Our billing staff will not be able to assist you with  questions regarding bills from these companies.  You will be contacted with the lab results as soon as they are available. The fastest way to get your results is to activate your My Chart account. Instructions are located on the last page of this paperwork. If you have not heard from [place] regarding the results in 2 weeks, please contact this office.        I personally performed the services described in this documentation, which was scribed in my presence. The recorded information has been reviewed and considered, and addended by me as needed.   Signed,   [name], MD [facility].  08/07/16 1:05 PM

## 2016-08-06 NOTE — Telephone Encounter (Signed)
Spoke with patient husband. [name] completed prescribed antibiotics yesterday with slight improvement. Husband states- she is very weak today, unable to get oob without assistance. Husband will need daughter to help bring wife to clinic. Advised to return to clinic today for evaluation. Transferred to scheduler

## 2016-08-13 ENCOUNTER — Ambulatory Visit (INDEPENDENT_AMBULATORY_CARE_PROVIDER_SITE_OTHER): Payer: Medicare Other

## 2016-08-13 ENCOUNTER — Ambulatory Visit (INDEPENDENT_AMBULATORY_CARE_PROVIDER_SITE_OTHER): Payer: Medicare Other | Admitting: Family Medicine

## 2016-08-13 VITALS — BP 118/54 | HR 55 | Temp 98.1°F | Resp 17 | Ht 64.5 in | Wt 113.0 lb

## 2016-08-13 DIAGNOSIS — J90 Pleural effusion, not elsewhere classified: Secondary | ICD-10-CM

## 2016-08-13 DIAGNOSIS — E039 Hypothyroidism, unspecified: Secondary | ICD-10-CM

## 2016-08-13 DIAGNOSIS — I70213 Atherosclerosis of native arteries of extremities with intermittent claudication, bilateral legs: Secondary | ICD-10-CM | POA: Diagnosis not present

## 2016-08-13 DIAGNOSIS — D72829 Elevated white blood cell count, unspecified: Secondary | ICD-10-CM

## 2016-08-13 DIAGNOSIS — R531 Weakness: Secondary | ICD-10-CM | POA: Diagnosis not present

## 2016-08-13 DIAGNOSIS — R5381 Other malaise: Secondary | ICD-10-CM

## 2016-08-13 DIAGNOSIS — R8271 Bacteriuria: Secondary | ICD-10-CM | POA: Diagnosis not present

## 2016-08-13 DIAGNOSIS — S2232XA Fracture of one rib, left side, initial encounter for closed fracture: Secondary | ICD-10-CM | POA: Diagnosis not present

## 2016-08-13 DIAGNOSIS — R5383 Other fatigue: Secondary | ICD-10-CM

## 2016-08-13 DIAGNOSIS — S2232XD Fracture of one rib, left side, subsequent encounter for fracture with routine healing: Secondary | ICD-10-CM | POA: Diagnosis not present

## 2016-08-13 LAB — COMPLETE METABOLIC PANEL WITH GFR
ALBUMIN: 3.6 g/dL (ref 3.6–5.1)
ALK PHOS: 81 U/L (ref 33–130)
ALT: 10 U/L (ref 6–29)
AST: 20 U/L (ref 10–35)
BILIRUBIN TOTAL: 0.5 mg/dL (ref 0.2–1.2)
BUN: 22 mg/dL (ref 7–25)
CO2: 32 mmol/L — ABNORMAL HIGH (ref 20–31)
Calcium: 10.9 mg/dL — ABNORMAL HIGH (ref 8.6–10.4)
Chloride: 100 mmol/L (ref 98–110)
Creat: 1 mg/dL — ABNORMAL HIGH (ref 0.60–0.88)
GFR, EST AFRICAN AMERICAN: 60 mL/min (ref >=60)
GFR, EST NON AFRICAN AMERICAN: 52 mL/min — AB (ref >=60)
Glucose, Bld: 103 mg/dL — ABNORMAL HIGH (ref 65–99)
Potassium: 4.6 mmol/L (ref 3.5–5.3)
Sodium: 139 mmol/L (ref 135–146)
TOTAL PROTEIN: 6.8 g/dL (ref 6.1–8.1)

## 2016-08-13 LAB — TSH: TSH: 3.89 m[IU]/L

## 2016-08-13 LAB — CBC
HCT: 40 % (ref 35.0–45.0)
HEMOGLOBIN: 13.4 g/dL (ref 11.7–15.5)
MCH: 31.3 pg (ref 27.0–33.0)
MCHC: 33.5 g/dL (ref 32.0–36.0)
MCV: 93.5 fL (ref 80.0–100.0)
MPV: 9.3 fL (ref 7.5–12.5)
Platelets: 398 10*3/uL (ref 140–400)
RBC: 4.28 MIL/uL (ref 3.80–5.10)
RDW: 13.4 % (ref 11.0–15.0)
WBC: 9.9 10*3/uL (ref 3.8–10.8)

## 2016-08-13 NOTE — Progress Notes (Signed)
By signing my name below, I, [name], attest that this documentation has been prepared under the direction and in the presence of [name], MD.  Electronically Signed: [name], Medical Scribe. 08/13/16. 12:39 PM.  Subjective:    Patient ID: [name], female    DOB: 29-Sep-1931, 82 y.o.   MRN: [id number]  HPI Chief Complaint  Patient presents with  . Follow-up    cracked rib    HPI Comments: [name] is a 82 y.o. female who presents to the [facility] for follow-up of left rib fracture. She was last seen by me Oct 17th, and had prev been seen Oct 9th. When seen at last visit she reported fall 8 days prior, some generalized weakness, and fatigue at that time. She was using her cane for ambulation. She had fallen after tripping over a box at home. When seen on Oct 9th, new left pleural effusion was noted, was treated with Azithromycin. At last visit, she had just finished her Zpak and coughing had somewhat subsided. She did complain of initial left knee pain that had improved and she was able to bear weight on that knee. Repeat x-ray at last visit indicated a 7th rib fracture on the left with stable pleural effusion. Pain was controlled with OTC tylenol and thought to have mild contusion of left knee  Pt states her rib pain and general weakness/malaise had improved since last visit. Pt has been having weakness/malaise without focal weakness but overall her general weakness has improved with rolling walker. Pt sleeps on her left side and she has been sleeping on the couch initially and now sleeping on a day bed.  Pt has been doing lower leg raise exercises while her legs are dangling from her day bed. Pt has been using a warm sock filled with rice for relief to her rib and knee pain. Pt walks with a walker and she's been more mobile, but she can't walk by herself. Pt had weakness since 07/29/16 onset and hasn't had to use a walker before then, but would use  the cane for the past months occasionally. Pt denies fever, cough, SOB, slurred speech, arm drooping, dysuria, urinary frequency, and urinary urgency. Overall weakness has been stable or slightly improved.  She had a CMP in Aug 2017 and PMHx of hypothyroidism recommended inc from 25 to 50 mcg QD since Aug 22nd. Pt has been compliant with her medication when initially discussed, but on repeat questioning after information from pharmacy indicating last prescription from 2016, not sure if she has been taking the medication. Lab Results  Component Value Date   TSH 5.33 (H) 05/28/2016   Leukocytosis: On blood work from Sept, prev elevated white blood cell at prev visit. She was treated with Azithromycin and had a urine culture that grew multiple organisms. Denies recent fever, cough, abd pain, or dysuria/change in urination.  Lab Results  Component Value Date   WBC 12.7 (A) 07/29/2016   HGB 14.3 07/29/2016   HCT 41.6 07/29/2016   MCV 92.7 07/29/2016   PLT 256 05/28/2016    Patient Active Problem List   Diagnosis Date Noted  . Alzheimer's disease 01/04/2016  . Pancreas cyst 07/02/2015  . Asthma, chronic 03/16/2015  . Primary open angle glaucoma of left eye, moderate stage 12/25/2014  . Hypothyroidism 11/17/2014  . Pseudophakia of both eyes 02/15/2013  . Anemia 05/25/2012  . Astigmatism 04/29/2012  . ALLERGIC RHINITIS 10/30/2010  . Asthma 09/27/2010  . HIP PAIN,  RIGHT 07/20/2007  . DYSPHAGIA, UNSPECIFIED 07/20/2007  . Venous (peripheral) insufficiency 04/13/2007  . Diabetes ([place]) 12/18/2006  . HYPERTENSION, BENIGN SYSTEMIC 12/18/2006  . Atherosclerosis of native arteries of extremity with intermittent claudication ([place]) 12/18/2006  . Renal failure 12/18/2006  . OSTEOARTHRITIS OF SPINE, NOS 12/18/2006   Past Medical History:  Diagnosis Date  . Alzheimer's dementia without behavioral disturbance    diagnosed 12/2015; Dr. Jade/neurology  . Anemia   . Asthma   . Blood transfusion  05-24-12  . Blood transfusion without reported diagnosis   . Chronic kidney disease    dr Clover Mealy  . Diabetes mellitus   . Diverticulosis   . DVT (deep venous thrombosis) ([place]) 03/2005   hx.left leg  . Esophageal stricture   . GERD (gastroesophageal reflux disease)   . Glaucoma   . Hiatal hernia 05/07/13  . Hyperlipidemia   . Hypertension   . Osteoarthritis   . Pancreatic duct dilated 05/07/13  . Pulmonary nodules 05/07/13  . PVD (peripheral vascular disease) ([place])   . Tubular adenoma of colon    Past Surgical History:  Procedure Laterality Date  . CATARACT EXTRACTION, BILATERAL    . COLONOSCOPY  05/27/2012   Procedure: COLONOSCOPY;  Surgeon: Lafayette Dragon, MD;  Location: [facility];  Service: Endoscopy;  Laterality: N/A;  . ESOPHAGOGASTRODUODENOSCOPY  05/26/2012   Procedure: ESOPHAGOGASTRODUODENOSCOPY (EGD);  Surgeon: Lafayette Dragon, MD;  Location: [facility];  Service: Endoscopy;  Laterality: N/A;  . EUS N/A 07/08/2013   Procedure: UPPER ENDOSCOPIC ULTRASOUND (EUS) LINEAR;  Surgeon: Milus Banister, MD;  Location: [facility];  Service: Endoscopy;  Laterality: N/A;  need side view scope  . EYE SURGERY    . VASCULAR SURGERY  09/2004  . WRIST FRACTURE SURGERY     R wrist fracture, plate   No Known Allergies Prior to Admission medications   Medication Sig Start Date End Date Taking? Authorizing Provider  Ascorbic Acid (VITAMIN C) 1000 MG tablet Take 1,000 mg by mouth daily.   Yes Historical Provider, MD  aspirin 81 MG tablet Take 81 mg by mouth daily.   Yes Historical Provider, MD  beclomethasone (QVAR) 40 MCG/ACT inhaler Inhale 2 puffs into the lungs 2 (two) times daily. 05/28/16  Yes Wardell Honour, MD  brimonidine (ALPHAGAN P) 0.1 % SOLN INSTILL 1 DROP IN LEFT EYE TWICE DAILY. 04/24/16  Yes Historical Provider, MD  calcium carbonate (OS-CAL) 600 MG TABS Take 600 mg by mouth 2 (two) times daily with a meal.   Yes Historical Provider, MD  dextrose (GLUTOSE) 40 % GEL Take 1 Tube by  mouth once as needed.    Yes Historical Provider, MD  donepezil (ARICEPT) 10 MG tablet Take 1 tablet (10 mg total) by mouth at bedtime. 05/28/16  Yes Wardell Honour, MD  famotidine (PEPCID) 20 MG tablet TAKE ONE TABLET AT BEDTIME. 05/10/16  Yes Wardell Honour, MD  fenofibrate (TRICOR) 48 MG tablet TAKE 1 TABLET ONCE DAILY. 06/21/16  Yes Wardell Honour, MD  ferrous fumarate (HEMOCYTE - 106 MG FE) 325 (106 FE) MG TABS Take 1 tablet by mouth every evening.    Yes Historical Provider, MD  fluticasone (FLONASE) 50 MCG/ACT nasal spray Place 2 sprays into both nostrils daily. 05/28/16  Yes Wardell Honour, MD  levothyroxine (SYNTHROID, LEVOTHROID) 25 MCG tablet TAKE 1 TABLET DAILY BEFORE BREAKFAST 06/12/15  Yes Chelle Jeffery, PA-C  metFORMIN (GLUCOPHAGE) 500 MG tablet TAKE 1 TABLET ONCE DAILY WITH SUPPER. 06/21/16  Yes Steffanie Dunn  Koren Bound, MD  metoprolol succinate (TOPROL-XL) 50 MG 24 hr tablet TAKE 1 TABLET DAILY AFTER A MEAL. 06/25/16  Yes Wardell Honour, MD  Multiple Vitamin (MULTIVITAMIN WITH MINERALS) TABS tablet Take 1 tablet by mouth daily.   Yes Historical Provider, MD  PROAIR HFA 108 [phone] Base) MCG/ACT inhaler USE 2 PUFFS EVERY 6 HOURS AS NEEDED FOR SHORTNESS OF BREATH. 06/11/16  Yes Wardell Honour, MD   Social History   Social History  . Marital status: Married    Spouse name: N/A  . Number of children: N/A  . Years of education: N/A   Occupational History  . Not on file.   Social History Main Topics  . Smoking status: Never Smoker  . Smokeless tobacco: Never Used  . Alcohol use No  . Drug use: No  . Sexual activity: Not on file   Other Topics Concern  . Not on file   Social History Narrative   Marital status: married x 60 years      Children:  4 children; 11 grandchildren; 2 gg      Lives: with husband, dog      Employment: Retired      Tobacco: never      Alcohol: none      Exercise:  Walking daily short distances; previous Firefighter      ADLs:  Walks with cane; cleans and cooks;  drives.     Born: [place], [place]   Has 2 years of college.    Review of Systems  Constitutional: Positive for fatigue. Negative for fever.  Respiratory: Negative for cough and shortness of breath.   Genitourinary: Negative for dysuria, frequency and urgency.  Neurological: Positive for weakness. Negative for speech difficulty.   Objective:  Physical Exam  Constitutional: She appears well-developed and well-nourished. No distress.  HENT:  Head: Normocephalic and atraumatic.  Eyes: Conjunctivae are normal.  Neck: Neck supple. No thyroid mass and no thyromegaly present.  Cardiovascular: Normal rate, regular rhythm and normal heart sounds.  Exam reveals no gallop and no friction rub.   No murmur heard. Pulmonary/Chest: Effort normal. No respiratory distress. She has no wheezes. She has rhonchi in the left lower field. She has no rales.  Airway nl bilateraly  Musculoskeletal:  Left knee: no effusion No ecchymosis Skin intact No edema  Neurological: She is alert.  Skin: Skin is warm and dry.  Psychiatric: She has a normal mood and affect. Her behavior is normal.  Nursing note and vitals reviewed.  BP (!) 118/54 (BP Location: Right Arm, Patient Position: Sitting, Cuff Size: Normal)   Pulse (!) 55   Temp 98.1 F (36.7 C) (Oral)   Resp 17   Ht 5' 4.5" (1.638 m)   Wt 113 lb (51.3 kg)   SpO2 95%   BMI 19.10 kg/m    Dg Chest 2 View  Result Date: 08/13/2016 CLINICAL DATA:  Left pleural effusion and left seventh rib fracture. EXAM: CHEST  2 VIEW COMPARISON:  08/06/2016 FINDINGS: Stable small left pleural effusion. Seventh rib fracture laterally is slightly indistinct but still visible. Bony demineralization. Difficult to exclude right lateral fourth and fifth rib fractures. No visible pneumothorax. Atherosclerotic aortic arch. Emphysema. Thoracic spondylosis. IMPRESSION: 1. Left lateral seventh rib fracture. Questionable right fourth and fifth rib fractures laterally. 2. Stable left  pleural effusion. 3. Atherosclerotic aortic arch. 4. Emphysema. Electronically Signed   By: Van Clines M.D.   On: 08/13/2016 13:38   Assessment & Plan:   Thayer Headings  Hardimon is a 82 y.o. female Pleural effusion, left - Plan: DG Chest 2 View Closed fracture of one rib of left side with routine healing, subsequent encounter  - Stable left pleural effusion, possibly due to initial trauma with underlying rib fracture. X-ray results reviewed, she does not have any right-sided chest wall pain. No known prior fractures.  -Continue warm compresses, Tylenol as needed, can follow-up to discuss fractures and assessment of bone density  with PCP (bone density order noted)  - If effusion persists, consider CT scanning for further delineation versus discussion with pulmonology or cardiology. One side effusion, less likely cardiac source.  Malaise and fatigue - Plan: COMPLETE METABOLIC PANEL WITH GFR Generalized weakness, Leukocytosis, unspecified type - Plan: CBC  - Multiple medical problems, did require some use of cane previously, but with more reliance on walker recently, will repeat CMP, TSH and CBC with prior leukocytosis. Plan to follow-up with PCP within the next 1 week to discuss the symptoms further, sooner if worse.  Hypothyroidism, unspecified type - Plan: TSH  Last TSH slightly elevated, plan for increase Synthroid was noted, but not sure if she is taking higher dose of Synthroid or taking Synthroid at all at this point. Ask them to bring in her medications to the next visit to verify what actual medication she has has not been taking.  Bacteriuria - Plan: Urine culture  - Repeat urine culture as mixed flora last visit. Asymptomatic currently.  No orders of the defined types were placed in this encounter.  Patient Instructions   Your x-ray appears to be stable, but will have the radiologist read that as well. I'm glad to hear that the pain on your side has improved, which should indicate that  the rib fracture is healing. You can continue Tylenol over-the-counter as needed for pain if needed and warm compresses if those are helping. If you have any fevers, increased shortness of breath or worsening chest pain return here or the emergency room as we discussed.  Can discuss with Dr. Tamala Julian at follow-up if repeat x-ray or other imaging needed for this fluid, or possible cardiology eval if it does continue to present.   For the fatigue and generalized weakness, although this is improving, did check some other blood work today including repeating your thyroid test some of the electrolytes that were abnormal in August as well as your blood count. Additionally I will check for any significant infection in your urine as a last time we checked that test there were multiple types of bacteria which usually does not indicate an infection.  I would like you to follow-up with Dr. Tamala Julian within the next 1 week.  Bring all your medicines so we can verify what medicines you are taking.  It appeared that your thyroid was not controlled on last blood test and may need to increase dose if blood test is still low.   Return to the clinic or go to the nearest emergency room if any of your symptoms worsen or new symptoms occur.   Pleural Effusion A pleural effusion is an abnormal buildup of fluid in the layers of tissue between your lungs and the inside of your chest (pleural space). These two layers of tissue that line both your lungs and the inside of your chest are called pleura. Usually, there is no air in the space between the pleura, only a thin layer of fluid. If left untreated, a large amount of fluid can build up and cause the lung to  collapse. A pleural effusion is usually caused by another disease that requires treatment. The two main types of pleural effusion are:  Transudative pleural effusion. This happens when fluid leaks into the pleural space because of a low protein count in your blood or high blood  pressure in your vessels. Heart failure often causes this.  Exudative infusion. This occurs when fluid collects in the pleural space from blocked blood vessels or lymph vessels. Some lung diseases, injuries, and cancers can cause this type of effusion. CAUSES Pleural effusion can be caused by:  Heart failure.  A blood clot in the lung (pulmonary embolism).  Pneumonia.  Cancer.  Liver failure (cirrhosis).  Kidney disease.  Complications from surgery, such as from open heart surgery. SIGNS AND SYMPTOMS In some cases, pleural effusion may cause no symptoms. Symptoms can include:  Shortness of breath, especially when lying down.  Chest pain, often worse when taking a deep breath.  Fever.  Dry cough that is lasting (chronic).  Hiccups.  Rapid breathing. An underlying condition that is causing the pleural effusion (such as heart failure, pneumonia, blood clots, tuberculosis, or cancer) may also cause additional symptoms. DIAGNOSIS Your health care provider may suspect pleural effusion based on your symptoms and medical history. Your health care provider will also do a physical exam and a chest X-ray. If the X-ray shows there is fluid in your chest, you may need to have this fluid removed using a needle (thoracentesis) so it can be tested. You may also have:  Imaging studies of the chest, such as:  Ultrasound.  CT scan.  Blood tests for kidney and liver function. TREATMENT Treatment depends on the cause of the pleural effusion. Treatment may include:  Taking antibiotic medicines to clear up an infection that is causing the pleural effusion.  Placing a tube in the chest to drain the effusion (tube thoracostomy). This procedure is often used when there is an infection in the fluid.  Surgery to remove the fibrous outer layer of tissue from the pleural space (decortication).  Thoracentesis, which can improve cough and shortness of breath.  A procedure to put medicine into  the chest cavity to seal the pleural space to prevent fluid buildup (pleurodesis).  Chemotherapy and radiation therapy. These may be required in the case of cancerous (malignant) pleural effusion. HOME CARE INSTRUCTIONS  Take medicines only as directed by your health care provider.  Keep track of how long you can gently exercise before you get short of breath. Try simply walking at first.  Do not use any tobacco products, including cigarettes, chewing tobacco, or electronic cigarettes. If you need help quitting, ask your health care provider.  Keep all follow-up visits as directed by your health care provider. This is important. SEEK MEDICAL CARE IF:  The amount of time that you are able to exercise decreases or does not improve with time.  You have pain or signs of infection at the puncture site if you had thoracentesis. Watch for:  Drainage.  Redness.  Swelling.  You have a fever. SEEK IMMEDIATE MEDICAL CARE IF:  You are short of breath.  You develop chest pain.  You develop a new cough. MAKE SURE YOU:  Understand these instructions.  Will watch your condition.  Will get help right away if you are not doing well or get worse.   This information is not intended to replace advice given to you by your health care provider. Make sure you discuss any questions you have with your health  care provider.   Document Released: 10/07/2005 Document Revised: 10/28/2014 Document Reviewed: 03/02/2014 Elsevier Interactive Patient Education 2016 [employer].    IF you received an x-ray today, you will receive an invoice from [facility]. Please contact [facility] at [phone] with questions or concerns regarding your invoice.   IF you received labwork today, you will receive an invoice from [employer]. Please contact [facility] at [phone] with questions or concerns regarding your invoice.   Our billing staff will not be able to  assist you with questions regarding bills from these companies.  You will be contacted with the lab results as soon as they are available. The fastest way to get your results is to activate your My Chart account. Instructions are located on the last page of this paperwork. If you have not heard from [place] regarding the results in 2 weeks, please contact this office.       I personally performed the services described in this documentation, which was scribed in my presence. The recorded information has been reviewed and considered, and addended by me as needed.   Signed,   [name], MD [facility].  08/13/16 3:46 PM

## 2016-08-13 NOTE — Patient Instructions (Addendum)
Your x-ray appears to be stable, but will have the radiologist read that as well. I'm glad to hear that the pain on your side has improved, which should indicate that the rib fracture is healing. You can continue Tylenol over-the-counter as needed for pain if needed and warm compresses if those are helping. If you have any fevers, increased shortness of breath or worsening chest pain return here or the emergency room as we discussed.  Can discuss with Dr. Tamala Julian at follow-up if repeat x-ray or other imaging needed for this fluid, or possible cardiology eval if it does continue to present.   For the fatigue and generalized weakness, although this is improving, did check some other blood work today including repeating your thyroid test some of the electrolytes that were abnormal in August as well as your blood count. Additionally I will check for any significant infection in your urine as a last time we checked that test there were multiple types of bacteria which usually does not indicate an infection.  I would like you to follow-up with Dr. Tamala Julian within the next 1 week.  Bring all your medicines so we can verify what medicines you are taking.  It appeared that your thyroid was not controlled on last blood test and may need to increase dose if blood test is still low.   Return to the clinic or go to the nearest emergency room if any of your symptoms worsen or new symptoms occur.   Pleural Effusion A pleural effusion is an abnormal buildup of fluid in the layers of tissue between your lungs and the inside of your chest (pleural space). These two layers of tissue that line both your lungs and the inside of your chest are called pleura. Usually, there is no air in the space between the pleura, only a thin layer of fluid. If left untreated, a large amount of fluid can build up and cause the lung to collapse. A pleural effusion is usually caused by another disease that requires treatment. The two main types of  pleural effusion are:  Transudative pleural effusion. This happens when fluid leaks into the pleural space because of a low protein count in your blood or high blood pressure in your vessels. Heart failure often causes this.  Exudative infusion. This occurs when fluid collects in the pleural space from blocked blood vessels or lymph vessels. Some lung diseases, injuries, and cancers can cause this type of effusion. CAUSES Pleural effusion can be caused by:  Heart failure.  A blood clot in the lung (pulmonary embolism).  Pneumonia.  Cancer.  Liver failure (cirrhosis).  Kidney disease.  Complications from surgery, such as from open heart surgery. SIGNS AND SYMPTOMS In some cases, pleural effusion may cause no symptoms. Symptoms can include:  Shortness of breath, especially when lying down.  Chest pain, often worse when taking a deep breath.  Fever.  Dry cough that is lasting (chronic).  Hiccups.  Rapid breathing. An underlying condition that is causing the pleural effusion (such as heart failure, pneumonia, blood clots, tuberculosis, or cancer) may also cause additional symptoms. DIAGNOSIS Your health care provider may suspect pleural effusion based on your symptoms and medical history. Your health care provider will also do a physical exam and a chest X-ray. If the X-ray shows there is fluid in your chest, you may need to have this fluid removed using a needle (thoracentesis) so it can be tested. You may also have:  Imaging studies of the chest, such as:  Ultrasound.  CT scan.  Blood tests for kidney and liver function. TREATMENT Treatment depends on the cause of the pleural effusion. Treatment may include:  Taking antibiotic medicines to clear up an infection that is causing the pleural effusion.  Placing a tube in the chest to drain the effusion (tube thoracostomy). This procedure is often used when there is an infection in the fluid.  Surgery to remove the  fibrous outer layer of tissue from the pleural space (decortication).  Thoracentesis, which can improve cough and shortness of breath.  A procedure to put medicine into the chest cavity to seal the pleural space to prevent fluid buildup (pleurodesis).  Chemotherapy and radiation therapy. These may be required in the case of cancerous (malignant) pleural effusion. HOME CARE INSTRUCTIONS  Take medicines only as directed by your health care provider.  Keep track of how long you can gently exercise before you get short of breath. Try simply walking at first.  Do not use any tobacco products, including cigarettes, chewing tobacco, or electronic cigarettes. If you need help quitting, ask your health care provider.  Keep all follow-up visits as directed by your health care provider. This is important. SEEK MEDICAL CARE IF:  The amount of time that you are able to exercise decreases or does not improve with time.  You have pain or signs of infection at the puncture site if you had thoracentesis. Watch for:  Drainage.  Redness.  Swelling.  You have a fever. SEEK IMMEDIATE MEDICAL CARE IF:  You are short of breath.  You develop chest pain.  You develop a new cough. MAKE SURE YOU:  Understand these instructions.  Will watch your condition.  Will get help right away if you are not doing well or get worse.   This information is not intended to replace advice given to you by your health care provider. Make sure you discuss any questions you have with your health care provider.   Document Released: 10/07/2005 Document Revised: 10/28/2014 Document Reviewed: 03/02/2014 Elsevier Interactive Patient Education 2016 [employer].    IF you received an x-ray today, you will receive an invoice from [facility]. Please contact [facility] at [phone] with questions or concerns regarding your invoice.   IF you received labwork today, you will receive an invoice from  [employer]. Please contact [facility] at [phone] with questions or concerns regarding your invoice.   Our billing staff will not be able to assist you with questions regarding bills from these companies.  You will be contacted with the lab results as soon as they are available. The fastest way to get your results is to activate your My Chart account. Instructions are located on the last page of this paperwork. If you have not heard from [place] regarding the results in 2 weeks, please contact this office.

## 2016-08-13 NOTE — Progress Notes (Signed)
I called [facility] and confirmed her levothyroxine dose 94mcg qd last reilled  05/2015. I donot see any other dose rx.

## 2016-08-15 LAB — URINE CULTURE

## 2016-08-17 MED ORDER — CEPHALEXIN 500 MG PO CAPS: 500.0000 mg | ORAL_CAPSULE | Freq: Two times a day (BID) | ORAL | 0 refills | Status: DC

## 2016-08-17 NOTE — Addendum Note (Signed)
Addended by: [name] R on: 08/17/2016 12:29 PM   Modules accepted: Orders

## 2016-08-19 ENCOUNTER — Ambulatory Visit (INDEPENDENT_AMBULATORY_CARE_PROVIDER_SITE_OTHER): Payer: Medicare Other

## 2016-08-19 ENCOUNTER — Ambulatory Visit (INDEPENDENT_AMBULATORY_CARE_PROVIDER_SITE_OTHER): Payer: Medicare Other | Admitting: Family Medicine

## 2016-08-19 VITALS — BP 110/76 | HR 102 | Temp 98.1°F | Ht 64.5 in | Wt 114.0 lb

## 2016-08-19 DIAGNOSIS — Z23 Encounter for immunization: Secondary | ICD-10-CM

## 2016-08-19 DIAGNOSIS — J90 Pleural effusion, not elsewhere classified: Secondary | ICD-10-CM

## 2016-08-19 DIAGNOSIS — G301 Alzheimer's disease with late onset: Secondary | ICD-10-CM | POA: Diagnosis not present

## 2016-08-19 DIAGNOSIS — S8002XD Contusion of left knee, subsequent encounter: Secondary | ICD-10-CM

## 2016-08-19 DIAGNOSIS — E119 Type 2 diabetes mellitus without complications: Secondary | ICD-10-CM

## 2016-08-19 DIAGNOSIS — N3000 Acute cystitis without hematuria: Secondary | ICD-10-CM

## 2016-08-19 DIAGNOSIS — F02818 Dementia in other diseases classified elsewhere, unspecified severity, with other behavioral disturbance: Secondary | ICD-10-CM

## 2016-08-19 DIAGNOSIS — I70213 Atherosclerosis of native arteries of extremities with intermittent claudication, bilateral legs: Secondary | ICD-10-CM | POA: Diagnosis not present

## 2016-08-19 DIAGNOSIS — R5383 Other fatigue: Secondary | ICD-10-CM | POA: Diagnosis not present

## 2016-08-19 DIAGNOSIS — F0281 Dementia in other diseases classified elsewhere with behavioral disturbance: Secondary | ICD-10-CM | POA: Diagnosis not present

## 2016-08-19 DIAGNOSIS — S2232XD Fracture of one rib, left side, subsequent encounter for fracture with routine healing: Secondary | ICD-10-CM

## 2016-08-19 DIAGNOSIS — R000 Tachycardia, unspecified: Secondary | ICD-10-CM

## 2016-08-19 LAB — POCT CBC
Granulocyte percent: 66.2 %G (ref 37–80)
HCT, POC: 37.3 % — AB (ref 37.7–47.9)
Hemoglobin: 13.3 g/dL (ref 12.2–16.2)
Lymph, poc: 1.7 (ref 0.6–3.4)
MCH: 32.4 pg — AB (ref 27–31.2)
MCHC: 35.6 g/dL — AB (ref 31.8–35.4)
MCV: 90.9 fL (ref 80–97)
MID (CBC): 0.7 (ref 0–0.9)
MPV: 7.1 fL (ref 0–99.8)
POC Granulocyte: 9 — AB (ref 2–6.9)
POC LYMPH %: 23.9 % (ref 10–50)
POC MID %: 9 % (ref 0–12)
Platelet Count, POC: 277 10*3/uL (ref 142–424)
RBC: 4.11 M/uL (ref 4.04–5.48)
RDW, POC: 13.5 %
WBC: 7 10*3/uL (ref 4.6–10.2)

## 2016-08-19 LAB — COMPREHENSIVE METABOLIC PANEL
ALBUMIN: 3.5 g/dL — AB (ref 3.6–5.1)
ALK PHOS: 72 U/L (ref 33–130)
ALT: 9 U/L (ref 6–29)
AST: 19 U/L (ref 10–35)
BUN: 20 mg/dL (ref 7–25)
CALCIUM: 10.6 mg/dL — AB (ref 8.6–10.4)
CO2: 32 mmol/L — ABNORMAL HIGH (ref 20–31)
Chloride: 100 mmol/L (ref 98–110)
Creat: 0.96 mg/dL — ABNORMAL HIGH (ref 0.60–0.88)
GLUCOSE: 141 mg/dL — AB (ref 65–99)
POTASSIUM: 4.2 mmol/L (ref 3.5–5.3)
Sodium: 138 mmol/L (ref 135–146)
TOTAL PROTEIN: 6.5 g/dL (ref 6.1–8.1)
Total Bilirubin: 0.4 mg/dL (ref 0.2–1.2)

## 2016-08-19 NOTE — Patient Instructions (Signed)
     IF you received an x-ray today, you will receive an invoice from [facility]. Please contact [facility] at [phone] with questions or concerns regarding your invoice.   IF you received labwork today, you will receive an invoice from [facility]. Please contact [facility] at [phone] with questions or concerns regarding your invoice.   Our billing staff will not be able to assist you with questions regarding bills from these companies.  You will be contacted with the lab results as soon as they are available. The fastest way to get your results is to activate your My Chart account. Instructions are located on the last page of this paperwork. If you have not heard from us regarding the results in 2 weeks, please contact this office.      

## 2016-08-19 NOTE — Progress Notes (Signed)
Subjective:    Patient ID: [name], female    DOB: [birth year], 82 y.o.   MRN: [id number]  08/19/2016  Follow-up   HPI This 82 y.o. female presents for one week follow-up:  1. Rib fracture L seventh: detected on repeat CXR when following up for pleural effusion.    2.  Pleural effusion L: s/p azithromycin to treat underlying infectious etiology.    3.  Malaise/fatigue:  Still requiring rolling walker to ambulate.  Pt insists that walking without walker at home.  Pt insists that husband is not with her at all times. "I could be dead and he wouldn't know it".  Husband reports that stronger than was before.  Stronger than one week ago.  "I'm a strong old cuss".  Energy is not at baseline; not able to work in [employer] garden.  Still on daybed; not able to ambulate up the stairs yet. Six months ago started walking with a cane especially in public.    4.  Urinary tract infection: urine culture positive at last visit; treated with abx.    Wt Readings from Last 3 Encounters:  08/19/16 114 lb (51.7 kg)  08/13/16 113 lb (51.3 kg)  08/06/16 112 lb 9.6 oz (51.1 kg)   BP Readings from Last 3 Encounters:  08/19/16 110/76  08/13/16 (!) 118/54  08/06/16 (!) 176/72   Pulse Readings from Last 3 Encounters:  08/19/16 (!) 102  08/13/16 (!) 55  08/06/16 (!) 55   5. L knee contusion: hit knee with fall.  Has applied ice to L knee.  Knee pain much improved and ambulation has improved; no pain with ambulation.   6.  Tachycardia: no previous cardiology consultation in the past.  Takes Metoprolol daily; husband reports that patient takes medications 90% of time; husband prepares medications each day.     Review of Systems  Constitutional: Positive for fatigue. Negative for chills, diaphoresis and fever.  Eyes: Negative for visual disturbance.  Respiratory: Positive for shortness of breath. Negative for cough and wheezing.   Cardiovascular: Negative for chest pain, palpitations and leg swelling.    Gastrointestinal: Negative for abdominal pain, constipation, diarrhea, nausea and vomiting.  Endocrine: Negative for cold intolerance, heat intolerance, polydipsia, polyphagia and polyuria.  Neurological: Negative for dizziness, tremors, seizures, syncope, facial asymmetry, speech difficulty, weakness, light-headedness, numbness and headaches.    Past Medical History:  Diagnosis Date  . Alzheimer's dementia without behavioral disturbance    diagnosed 12/2015; Dr. Jade/neurology  . Anemia   . Asthma   . Blood transfusion 05-24-12  . Blood transfusion without reported diagnosis   . Chronic kidney disease    dr Clover Mealy  . Diabetes mellitus   . Diverticulosis   . DVT (deep venous thrombosis) ([place]) 03/2005   hx.left leg  . Esophageal stricture   . GERD (gastroesophageal reflux disease)   . Glaucoma   . Hiatal hernia 05/07/13  . Hyperlipidemia   . Hypertension   . Osteoarthritis   . Pancreatic duct dilated 05/07/13  . Pulmonary nodules 05/07/13  . PVD (peripheral vascular disease) ([place])   . Tubular adenoma of colon    Past Surgical History:  Procedure Laterality Date  . CATARACT EXTRACTION, BILATERAL    . COLONOSCOPY  05/27/2012   Procedure: COLONOSCOPY;  Surgeon: Lafayette Dragon, MD;  Location: [facility];  Service: Endoscopy;  Laterality: N/A;  . ESOPHAGOGASTRODUODENOSCOPY  05/26/2012   Procedure: ESOPHAGOGASTRODUODENOSCOPY (EGD);  Surgeon: Lafayette Dragon, MD;  Location: [facility];  Service: Endoscopy;  Laterality: N/A;  . EUS N/A 07/08/2013   Procedure: UPPER ENDOSCOPIC ULTRASOUND (EUS) LINEAR;  Surgeon: Milus Banister, MD;  Location: [facility];  Service: Endoscopy;  Laterality: N/A;  need side view scope  . EYE SURGERY    . VASCULAR SURGERY  09/2004  . WRIST FRACTURE SURGERY     R wrist fracture, plate   No Known Allergies Current Outpatient Prescriptions  Medication Sig Dispense Refill  . Ascorbic Acid (VITAMIN C) 1000 MG tablet Take 1,000 mg by mouth daily.    Marland Kitchen aspirin  81 MG tablet Take 81 mg by mouth daily.    . beclomethasone (QVAR) 40 MCG/ACT inhaler Inhale 2 puffs into the lungs 2 (two) times daily. 1 Inhaler 12  . brimonidine (ALPHAGAN P) 0.1 % SOLN INSTILL 1 DROP IN LEFT EYE TWICE DAILY.    . calcium carbonate (OS-CAL) 600 MG TABS Take 600 mg by mouth 2 (two) times daily with a meal.    . cephALEXin (KEFLEX) 500 MG capsule Take 1 capsule (500 mg total) by mouth 2 (two) times daily. 20 capsule 0  . dextrose (GLUTOSE) 40 % GEL Take 1 Tube by mouth once as needed.     . donepezil (ARICEPT) 10 MG tablet Take 1 tablet (10 mg total) by mouth at bedtime. 30 tablet 3  . famotidine (PEPCID) 20 MG tablet TAKE ONE TABLET AT BEDTIME. 90 tablet 0  . fenofibrate (TRICOR) 48 MG tablet TAKE 1 TABLET ONCE DAILY. 90 tablet 0  . ferrous fumarate (HEMOCYTE - 106 MG FE) 325 (106 FE) MG TABS Take 1 tablet by mouth every evening.     . fluticasone (FLONASE) 50 MCG/ACT nasal spray Place 2 sprays into both nostrils daily. 16 g 11  . levothyroxine (SYNTHROID, LEVOTHROID) 25 MCG tablet TAKE 1 TABLET DAILY BEFORE BREAKFAST 90 tablet 0  . metFORMIN (GLUCOPHAGE) 500 MG tablet TAKE 1 TABLET ONCE DAILY WITH SUPPER. 90 tablet 0  . metoprolol succinate (TOPROL-XL) 50 MG 24 hr tablet TAKE 1 TABLET DAILY AFTER A MEAL. 90 tablet 0  . Multiple Vitamin (MULTIVITAMIN WITH MINERALS) TABS tablet Take 1 tablet by mouth daily.    Marland Kitchen PROAIR HFA 108 (90 Base) MCG/ACT inhaler USE 2 PUFFS EVERY 6 HOURS AS NEEDED FOR SHORTNESS OF BREATH. 17 g 0   No current facility-administered medications for this visit.    Social History   Social History  . Marital status: Married    Spouse name: N/A  . Number of children: N/A  . Years of education: N/A   Occupational History  . Not on file.   Social History Main Topics  . Smoking status: Never Smoker  . Smokeless tobacco: Never Used  . Alcohol use No  . Drug use: No  . Sexual activity: Not on file   Other Topics Concern  . Not on file   Social  History Narrative   Marital status: married x 60 years      Children:  4 children; 11 grandchildren; 2 gg      Lives: with husband, dog      Employment: Retired      Tobacco: never      Alcohol: none      Exercise:  Walking daily short distances; previous Firefighter      ADLs:  Walks with cane; cleans and cooks; drives.     Born: [place], [place]   Has 2 years of college.    Family History  Problem Relation Age of Onset  .  Adopted: Yes  . Migraines Daughter   . Cancer Son   . Colon cancer Neg Hx        Objective:    BP 110/76 (BP Location: Right Arm, Patient Position: Sitting, Cuff Size: Normal)   Pulse (!) 102   Temp 98.1 F (36.7 C) (Oral)   Ht 5' 4.5" (1.638 m)   Wt 114 lb (51.7 kg)   SpO2 99%   BMI 19.27 kg/m  Physical Exam  Constitutional: She is oriented to person, place, and time. She appears well-developed and well-nourished. No distress.  HENT:  Head: Normocephalic and atraumatic.  Right Ear: External ear normal.  Left Ear: External ear normal.  Nose: Nose normal.  Mouth/Throat: Oropharynx is clear and moist.  Eyes: Conjunctivae and EOM are normal. Pupils are equal, round, and reactive to light.  Neck: Normal range of motion. Neck supple. Carotid bruit is not present. No thyromegaly present.  Cardiovascular: Normal rate, regular rhythm, normal heart sounds and intact distal pulses.  Exam reveals no gallop and no friction rub.   No murmur heard. Pulmonary/Chest: Effort normal. She has decreased breath sounds in the left lower field. She has no wheezes. She has rales in the left lower field.  Abdominal: Soft. Bowel sounds are normal. She exhibits no distension and no mass. There is no tenderness. There is no rebound and no guarding.  Musculoskeletal:       Left knee: She exhibits normal range of motion, no swelling and no effusion. No tenderness found. No medial joint line and no lateral joint line tenderness noted.  Lymphadenopathy:    She has no cervical  adenopathy.  Neurological: She is alert and oriented to person, place, and time. No cranial nerve deficit.  Skin: Skin is warm and dry. No rash noted. She is not diaphoretic. No erythema. No pallor.  Psychiatric: She has a normal mood and affect. Her behavior is normal.   Results for orders placed or performed in visit on 08/13/16  Urine culture  Result Value Ref Range   Culture ESCHERICHIA COLI    Colony Count 50,000-100,000 CFU/mL    Organism ID, Bacteria ESCHERICHIA COLI       Susceptibility   Escherichia coli -  (no method available)    AMPICILLIN <=2 Sensitive     AMOX/CLAVULANIC <=2 Sensitive     AMPICILLIN/SULBACTAM <=2 Sensitive     PIP/TAZO <=4 Sensitive     IMIPENEM <=0.25 Sensitive     CEFAZOLIN <=4 Not Reportable     CEFTRIAXONE <=1 Sensitive     CEFTAZIDIME <=1 Sensitive     CEFEPIME <=1 Sensitive     GENTAMICIN <=1 Sensitive     TOBRAMYCIN <=1 Sensitive     CIPROFLOXACIN <=0.25 Sensitive     LEVOFLOXACIN <=0.12 Sensitive     NITROFURANTOIN <=16 Sensitive     TRIMETH/SULFA* <=20 Sensitive      * NR=NOT REPORTABLE,SEE COMMENTORAL therapy:A cefazolin MIC of <32 predicts susceptibility to the oral agents cefaclor,cefdinir,cefpodoxime,cefprozil,cefuroxime,cephalexin,and loracarbef when used for therapy of uncomplicated UTIs due to E.coli,K.pneumomiae,and P.mirabilis. PARENTERAL therapy: A cefazolinMIC of >8 indicates resistance to parenteralcefazolin. An alternate test method must beperformed to confirm susceptibility to parenteralcefazolin.  CBC  Result Value Ref Range   WBC 9.9 3.8 - 10.8 K/uL   RBC 4.28 3.80 - 5.10 MIL/uL   Hemoglobin 13.4 11.7 - 15.5 g/dL   HCT 40.0 35.0 - 45.0 %   MCV 93.5 80.0 - 100.0 fL   MCH 31.3 27.0 - 33.0 pg  MCHC 33.5 32.0 - 36.0 g/dL   RDW 13.4 11.0 - 15.0 %   Platelets 398 140 - 400 K/uL   MPV 9.3 7.5 - 12.5 fL  COMPLETE METABOLIC PANEL WITH GFR  Result Value Ref Range   Sodium 139 135 - 146 mmol/L   Potassium 4.6 3.5 - 5.3 mmol/L     Chloride 100 98 - 110 mmol/L   CO2 32 (H) 20 - 31 mmol/L   Glucose, Bld 103 (H) 65 - 99 mg/dL   BUN 22 7 - 25 mg/dL   Creat 1.00 (H) 0.60 - 0.88 mg/dL   Total Bilirubin 0.5 0.2 - 1.2 mg/dL   Alkaline Phosphatase 81 33 - 130 U/L   AST 20 10 - 35 U/L   ALT 10 6 - 29 U/L   Total Protein 6.8 6.1 - 8.1 g/dL   Albumin 3.6 3.6 - 5.1 g/dL   Calcium 10.9 (H) 8.6 - 10.4 mg/dL   GFR, Est African American 60 >=60 mL/min   GFR, Est Non African American 52 (L) >=60 mL/min  TSH  Result Value Ref Range   TSH 3.89 mIU/L       Assessment & Plan:   1. Pleural effusion, left   2. Tachycardia   3. Acute cystitis without hematuria   4. Closed fracture of one rib of left side with routine healing, subsequent encounter   5. Contusion of left knee, subsequent encounter   6. Other fatigue   7. Need for prophylactic vaccination and inoculation against influenza   8. Type 2 diabetes mellitus without complication, without long-term current use of insulin ([place])   9. Late onset Alzheimer's disease with behavioral disturbance    -persistent L pleural effusion without change; husband and pt desire proceeding with CT chest thus will order.  With Alzheimer's dementia, will need to consider extent of work up of pleural effusion; husband appears to desire aggressive work up and treatment at this time. -refer to cardiology due to tachycardia despite Metoprolol.  Hesitant to increase Metoprolol due to low blood pressure in office.   -L rib fractures stable and patient in minimal pain at this time.  Becoming more mobile within home; pt declines home physical therapy at this time.  -s/p flu vaccine.   Orders Placed This Encounter  Procedures  . DG Chest 2 View    Standing Status:   Future    Number of Occurrences:   1    Standing Expiration Date:   08/19/2017    Order Specific Question:   Reason for Exam (SYMPTOM  OR DIAGNOSIS REQUIRED)    Answer:   follow-up of L pleural effusion    Order Specific Question:    Preferred imaging location?    Answer:   External  . CT Chest W Contrast    Standing Status:   Future    Standing Expiration Date:   10/19/2017    Scheduling Instructions:     NOT Tuesday, October 31 or Wednesday, August 21, 2016.    Order Specific Question:   If indicated for the ordered procedure, I authorize the administration of contrast media per Radiology protocol    Answer:   Yes    Order Specific Question:   Reason for Exam (SYMPTOM  OR DIAGNOSIS REQUIRED)    Answer:   L pleural effusion    Order Specific Question:   Preferred imaging location?    Answer:   [facility]  . Flu Vaccine QUAD 36+ mos IM  .  Comprehensive metabolic panel  . Ambulatory referral to Cardiology    Referral Priority:   Routine    Referral Type:   Consultation    Referral Reason:   Specialty Services Required    Referred to Provider:   Leonie Man, MD    Requested Specialty:   Cardiology    Number of Visits Requested:   1  . POCT CBC  . EKG 12-Lead   No orders of the defined types were placed in this encounter.   Return in about 4 weeks (around 09/16/2016) for recheck.   Umeka Wrench Elayne Guerin, M.D. [facility] [address] [place], [place]  [phone] phone [phone] fax

## 2016-08-28 ENCOUNTER — Ambulatory Visit
Admission: RE | Admit: 2016-08-28 | Discharge: 2016-08-28 | Disposition: A | Discharge status: Home / Self Care | Payer: Medicare Other | Source: Ambulatory Visit | Attending: Family Medicine | Admitting: Family Medicine

## 2016-08-28 DIAGNOSIS — I2510 Atherosclerotic heart disease of native coronary artery without angina pectoris: Secondary | ICD-10-CM | POA: Insufficient documentation

## 2016-08-28 DIAGNOSIS — J90 Pleural effusion, not elsewhere classified: Secondary | ICD-10-CM | POA: Diagnosis not present

## 2016-08-28 MED ORDER — IOPAMIDOL (ISOVUE-300) INJECTION 61%
75.0000 mL | Freq: Once | INTRAVENOUS | Status: AC | PRN
  Administered 2016-08-28: 75 mL via INTRAVENOUS

## 2016-09-18 ENCOUNTER — Encounter: Payer: Self-pay | Admitting: Family Medicine

## 2016-09-18 ENCOUNTER — Ambulatory Visit (INDEPENDENT_AMBULATORY_CARE_PROVIDER_SITE_OTHER): Payer: Medicare Other | Admitting: Family Medicine

## 2016-09-18 VITALS — BP 104/70 | HR 54 | Temp 97.6°F | Resp 16 | Ht 64.5 in | Wt 117.0 lb

## 2016-09-18 DIAGNOSIS — J4540 Moderate persistent asthma, uncomplicated: Secondary | ICD-10-CM

## 2016-09-18 DIAGNOSIS — S2232XD Fracture of one rib, left side, subsequent encounter for fracture with routine healing: Secondary | ICD-10-CM

## 2016-09-18 DIAGNOSIS — F0280 Dementia in other diseases classified elsewhere without behavioral disturbance: Secondary | ICD-10-CM

## 2016-09-18 DIAGNOSIS — J90 Pleural effusion, not elsewhere classified: Secondary | ICD-10-CM | POA: Diagnosis not present

## 2016-09-18 DIAGNOSIS — I70213 Atherosclerosis of native arteries of extremities with intermittent claudication, bilateral legs: Secondary | ICD-10-CM | POA: Diagnosis not present

## 2016-09-18 DIAGNOSIS — G301 Alzheimer's disease with late onset: Secondary | ICD-10-CM | POA: Diagnosis not present

## 2016-09-18 DIAGNOSIS — I10 Essential (primary) hypertension: Secondary | ICD-10-CM | POA: Diagnosis not present

## 2016-09-18 DIAGNOSIS — K862 Cyst of pancreas: Secondary | ICD-10-CM

## 2016-09-18 NOTE — Progress Notes (Signed)
Subjective:    Patient ID: [name], female    DOB: 07/14/1931, 82 y.o.   MRN: [id number]  09/18/2016  Follow-up   HPI This 82 y.o. female presents with husband for follow-up of pleural effusion. S/p CT chest since last visit.  Presenting to discuss results. No troubles breathing; not using inhalers at all.  No wheezing; intermittent coughing; still has rhinorrhea; not using two nasal sprays that have been prescribed.  No fever.  Energy level has improved.  Husband working 6-10 hours per day.    On 07/29/16 pt fell and all of this started;five months ago, not using a cane.  Now using a cane; activity level has greatly declined in the past six months.  Just returned to church on Sunday.  However, husband does mention that patient Doing much better from last visit.  Husband endorses that patient has made major progress; she has come a long way since last visit.  Energy level has greatly improved.  Husband is very pleased with regain of energy.  Tired of sleeping in the den; tired of hearing husband getting up three times per night to urinate.  Pt hosted Thanksgiving and had 65 family members to the house.  Still awaiting appointment with cardiology.  Pleural effusion L small was detected before fall on 07/29/16.  Then suffered L  rib fractures with fall.    Immunization History  Administered Date(s) Administered  . Influenza Split 08/21/2012, 08/22/2015  . Influenza,inj,Quad PF,36+ Mos 08/19/2016  . Influenza-Unspecified 08/21/2013  . Pneumococcal Conjugate-13 10/06/2014  . Pneumococcal Polysaccharide-23 08/21/2005  . Td 02/19/2004  . Zoster 06/21/2012   BP Readings from Last 3 Encounters:  09/18/16 104/70  08/19/16 110/76  08/13/16 (!) 118/54   Wt Readings from Last 3 Encounters:  09/18/16 117 lb (53.1 kg)  08/19/16 114 lb (51.7 kg)  08/13/16 113 lb (51.3 kg)    Review of Systems  Constitutional: Negative for chills, diaphoresis, fatigue and fever.  Eyes: Negative for  visual disturbance.  Respiratory: Negative for cough and shortness of breath.   Cardiovascular: Negative for chest pain, palpitations and leg swelling.  Gastrointestinal: Negative for abdominal pain, constipation, diarrhea, nausea and vomiting.  Endocrine: Negative for cold intolerance, heat intolerance, polydipsia, polyphagia and polyuria.  Neurological: Negative for dizziness, tremors, seizures, syncope, facial asymmetry, speech difficulty, weakness, light-headedness, numbness and headaches.    Past Medical History:  Diagnosis Date  . Alzheimer's dementia without behavioral disturbance    diagnosed 12/2015; Dr. Jade/neurology  . Anemia   . Asthma   . Blood transfusion 05-24-12  . Blood transfusion without reported diagnosis   . Chronic kidney disease    dr Clover Mealy  . Diabetes mellitus   . Diverticulosis   . DVT (deep venous thrombosis) ([place]) 03/2005   hx.left leg  . Esophageal stricture   . GERD (gastroesophageal reflux disease)   . Glaucoma   . Hiatal hernia 05/07/13  . Hyperlipidemia   . Hypertension   . Osteoarthritis   . Pancreatic duct dilated 05/07/13  . Pulmonary nodules 05/07/13  . PVD (peripheral vascular disease) ([place])   . Tubular adenoma of colon    Past Surgical History:  Procedure Laterality Date  . CATARACT EXTRACTION, BILATERAL    . COLONOSCOPY  05/27/2012   Procedure: COLONOSCOPY;  Surgeon: Lafayette Dragon, MD;  Location: [facility];  Service: Endoscopy;  Laterality: N/A;  . ESOPHAGOGASTRODUODENOSCOPY  05/26/2012   Procedure: ESOPHAGOGASTRODUODENOSCOPY (EGD);  Surgeon: Lafayette Dragon, MD;  Location: [facility];  Service:  Endoscopy;  Laterality: N/A;  . EUS N/A 07/08/2013   Procedure: UPPER ENDOSCOPIC ULTRASOUND (EUS) LINEAR;  Surgeon: Milus Banister, MD;  Location: [facility];  Service: Endoscopy;  Laterality: N/A;  need side view scope  . EYE SURGERY    . VASCULAR SURGERY  09/2004  . WRIST FRACTURE SURGERY     R wrist fracture, plate   No Known  Allergies Current Outpatient Prescriptions  Medication Sig Dispense Refill  . Ascorbic Acid (VITAMIN C) 1000 MG tablet Take 1,000 mg by mouth daily.    Marland Kitchen aspirin 81 MG tablet Take 81 mg by mouth daily.    . beclomethasone (QVAR) 40 MCG/ACT inhaler Inhale 2 puffs into the lungs 2 (two) times daily. 1 Inhaler 12  . brimonidine (ALPHAGAN P) 0.1 % SOLN INSTILL 1 DROP IN LEFT EYE TWICE DAILY.    . calcium carbonate (OS-CAL) 600 MG TABS Take 600 mg by mouth 2 (two) times daily with a meal.    . cephALEXin (KEFLEX) 500 MG capsule Take 1 capsule (500 mg total) by mouth 2 (two) times daily. 20 capsule 0  . dextrose (GLUTOSE) 40 % GEL Take 1 Tube by mouth once as needed.     . donepezil (ARICEPT) 10 MG tablet Take 1 tablet (10 mg total) by mouth at bedtime. 30 tablet 3  . famotidine (PEPCID) 20 MG tablet TAKE ONE TABLET AT BEDTIME. 90 tablet 0  . fenofibrate (TRICOR) 48 MG tablet TAKE 1 TABLET ONCE DAILY. 90 tablet 0  . ferrous fumarate (HEMOCYTE - 106 MG FE) 325 (106 FE) MG TABS Take 1 tablet by mouth every evening.     . fluticasone (FLONASE) 50 MCG/ACT nasal spray Place 2 sprays into both nostrils daily. 16 g 11  . levothyroxine (SYNTHROID, LEVOTHROID) 25 MCG tablet TAKE 1 TABLET DAILY BEFORE BREAKFAST 90 tablet 0  . metFORMIN (GLUCOPHAGE) 500 MG tablet TAKE 1 TABLET ONCE DAILY WITH SUPPER. 90 tablet 0  . metoprolol succinate (TOPROL-XL) 50 MG 24 hr tablet TAKE 1 TABLET DAILY AFTER A MEAL. 90 tablet 0  . Multiple Vitamin (MULTIVITAMIN WITH MINERALS) TABS tablet Take 1 tablet by mouth daily.    Marland Kitchen PROAIR HFA 108 (90 Base) MCG/ACT inhaler USE 2 PUFFS EVERY 6 HOURS AS NEEDED FOR SHORTNESS OF BREATH. 17 g 0   No current facility-administered medications for this visit.    Social History   Social History  . Marital status: Married    Spouse name: N/A  . Number of children: N/A  . Years of education: N/A   Occupational History  . Not on file.   Social History Main Topics  . Smoking status: Never  Smoker  . Smokeless tobacco: Never Used  . Alcohol use No  . Drug use: No  . Sexual activity: Not on file   Other Topics Concern  . Not on file   Social History Narrative   Marital status: married x 60 years      Children:  4 children; 11 grandchildren; 2 gg      Lives: with husband, dog      Employment: Retired      Tobacco: never      Alcohol: none      Exercise:  Walking daily short distances; previous Firefighter      ADLs:  Walks with cane; cleans and cooks; drives.     Born: [place], [place]   Has 2 years of college.    Family History  Problem Relation Age of Onset  .  Adopted: Yes  . Migraines Daughter   . Cancer Son   . Colon cancer Neg Hx        Objective:    BP 104/70 (BP Location: Right Arm, Patient Position: Sitting, Cuff Size: Small)   Pulse (!) 54   Temp 97.6 F (36.4 C) (Oral)   Resp 16   Ht 5' 4.5" (1.638 m)   Wt 117 lb (53.1 kg)   SpO2 96%   BMI 19.77 kg/m  Physical Exam  Constitutional: She is oriented to person, place, and time. She appears well-developed and well-nourished. No distress.  HENT:  Head: Normocephalic and atraumatic.  Right Ear: External ear normal.  Left Ear: External ear normal.  Nose: Nose normal.  Mouth/Throat: Oropharynx is clear and moist.  Eyes: Conjunctivae and EOM are normal. Pupils are equal, round, and reactive to light.  Neck: Normal range of motion. Neck supple. Carotid bruit is not present. No thyromegaly present.  Cardiovascular: Normal rate, regular rhythm, normal heart sounds and intact distal pulses.  Exam reveals no gallop and no friction rub.   No murmur heard. Pulmonary/Chest: Effort normal and breath sounds normal. She has no wheezes. She has no rales.  Abdominal: Soft. Bowel sounds are normal. She exhibits no distension and no mass. There is no tenderness. There is no rebound and no guarding.  Lymphadenopathy:    She has no cervical adenopathy.  Neurological: She is alert and oriented to person, place, and  time. No cranial nerve deficit.  Skin: Skin is warm and dry. No rash noted. She is not diaphoretic. No erythema. No pallor.  Psychiatric: She has a normal mood and affect. Her behavior is normal.   Results for orders placed or performed in visit on 08/19/16  Comprehensive metabolic panel  Result Value Ref Range   Sodium 138 135 - 146 mmol/L   Potassium 4.2 3.5 - 5.3 mmol/L   Chloride 100 98 - 110 mmol/L   CO2 32 (H) 20 - 31 mmol/L   Glucose, Bld 141 (H) 65 - 99 mg/dL   BUN 20 7 - 25 mg/dL   Creat 0.96 (H) 0.60 - 0.88 mg/dL   Total Bilirubin 0.4 0.2 - 1.2 mg/dL   Alkaline Phosphatase 72 33 - 130 U/L   AST 19 10 - 35 U/L   ALT 9 6 - 29 U/L   Total Protein 6.5 6.1 - 8.1 g/dL   Albumin 3.5 (L) 3.6 - 5.1 g/dL   Calcium 10.6 (H) 8.6 - 10.4 mg/dL  POCT CBC  Result Value Ref Range   WBC 7.0 4.6 - 10.2 K/uL   Lymph, poc 1.7 0.6 - 3.4   POC LYMPH PERCENT 23.9 10 - 50 %L   MID (cbc) 0.7 0 - 0.9   POC MID % 9 0 - 12 %M   POC Granulocyte 9 (A) 2 - 6.9   Granulocyte percent 66.2 37 - 80 %G   RBC 4.11 4.04 - 5.48 M/uL   Hemoglobin 13.3 12.2 - 16.2 g/dL   HCT, POC 37.3 (A) 37.7 - 47.9 %   MCV 90.9 80 - 97 fL   MCH, POC 32.4 (A) 27 - 31.2 pg   MCHC 35.6 (A) 31.8 - 35.4 g/dL   RDW, POC 13.5 %   Platelet Count, POC 277 142 - 424 K/uL   MPV 7.1 0 - 99.8 fL   Ct Chest W Contrast  Result Date: 08/28/2016 CLINICAL DATA:  Pleural effusion.  Left rib pain. EXAM: CT CHEST WITH CONTRAST  TECHNIQUE: Multidetector CT imaging of the chest was performed during intravenous contrast administration. CONTRAST:  68mL ISOVUE-300 IOPAMIDOL (ISOVUE-300) INJECTION 61% COMPARISON:  Chest radiograph 08/19/2016, chest CT 09/20/2013 FINDINGS: Cardiovascular: The heart is mildly enlarged. There is no evidence of pericardial effusion. There is moderate to advanced calcific atherosclerotic disease of the coronary arteries and less so aorta. Mediastinum/Nodes: No enlarged mediastinal, hilar, or axillary lymph nodes. Shotty  some calcified sub pathologic lymph nodes in the mediastinum seen. The trachea and esophagus demonstrate no significant findings. Bilateral thyroid hypoattenuated masses are seen, larger on the right thyroid lobe measuring 14 mm. Lungs/Pleura: There is hyperinflation of the lungs. Linear opacities in the lingula may represent atelectasis versus scarring. There is a moderate in size left pleural effusion. Numerous bilaterally mostly solid pulmonary nodules are largely stable in appearance. Index nodule in the left upper lobe anteriorly measures 5 mm, prior measurement of 6 mm, image 53/150, sequence 5. The index nodule in the left lung base is stable measuring 5 mm, image 116/150, sequence 5. Dumbbell-shaped index nodule in the right upper lobe is stable head 11 mm, image 79/150, sequence 5. Upper Abdomen: The pancreas has diffusely cystic appearance and contains linear calcific structure within its head, partially visualized. Again seen is 1.2 cm calcified aneurysm of the splenic artery. Bilateral renal cysts are seen. Musculoskeletal: No chest wall abnormality. No acute or significant osseous findings. Multilevel osteoarthritic changes of the thoracic spine noted. IMPRESSION: Hyperinflation of the lungs suggestive of COPD. Largely stable numerous bilateral soft tissue nodules, thought to represent inflammatory nodules. Slow growing metastatic disease may have a similar appearance. Interval development of moderate in size left pleural effusion. If histologic diagnosis is desired, left thoracentesis may be considered. Interval development of peribronchial thickening and atelectatic changes in the lingula and superior segment of the left lower lobe. Advanced calcific atherosclerotic disease of the coronary arteries. Moderate atherosclerotic disease of the aorta. Interval cystic degeneration of the pancreas with coarse calcifications seen within the head of the pancreas. This may represent cystic replacement of the  pancreatic parenchyma or alternatively chronic obstruction of the main pancreatic duct with diffuse dilation of the main pancreatic duct and its attributes. Please correlate to patient's serum pancreatic enzymes. Electronically Signed   By: Fidela Salisbury M.D.   On: 08/28/2016 15:09        Assessment & Plan:   1. Pleural effusion   2. Closed fracture of one rib of left side with routine healing, subsequent encounter   3. HYPERTENSION, BENIGN SYSTEMIC   4. Pancreas cyst   5. Moderate persistent chronic asthma without complication   6. Late onset Alzheimer's disease without behavioral disturbance    -discussed CT results that revealed L pleural effusion.  Currently asymptomatic and patient refusing thoracentesis for diagnostic reasons.  Pt insists that she feels great and energy has returned to baseline; does not desire aggressive work up.  Discussed CT findings with daughter via phone.  Husband also present during visit. To discuss as a family.  Offered option to discuss further workup with pulmonologist if that would be a compromise for the family; pt declined referral to pulmonologist.  -RTC one month for reevaluation and to reevaluate pleural effusion. -pt functioning well at home; looks good at visit today.  No orders of the defined types were placed in this encounter.  No orders of the defined types were placed in this encounter.   Return in about 4 weeks (around 10/16/2016) for recheck pleural effusion L.   Steffanie Dunn  Elayne Guerin, M.D. [facility] [address] [place], [place]  [address] [phone] phone [phone] fax

## 2016-09-18 NOTE — Patient Instructions (Signed)
     IF you received an x-ray today, you will receive an invoice from [facility]. Please contact [facility] at [phone] with questions or concerns regarding your invoice.   IF you received labwork today, you will receive an invoice from [facility]. Please contact [facility] at [phone] with questions or concerns regarding your invoice.   Our billing staff will not be able to assist you with questions regarding bills from these companies.  You will be contacted with the lab results as soon as they are available. The fastest way to get your results is to activate your My Chart account. Instructions are located on the last page of this paperwork. If you have not heard from us regarding the results in 2 weeks, please contact this office.      

## 2016-09-23 DIAGNOSIS — J90 Pleural effusion, not elsewhere classified: Secondary | ICD-10-CM | POA: Insufficient documentation

## 2016-09-26 ENCOUNTER — Other Ambulatory Visit: Payer: Self-pay | Admitting: Family Medicine

## 2016-10-02 ENCOUNTER — Other Ambulatory Visit: Payer: Self-pay | Admitting: Family Medicine

## 2016-10-07 ENCOUNTER — Encounter: Payer: Self-pay | Admitting: Cardiology

## 2016-10-07 ENCOUNTER — Ambulatory Visit (INDEPENDENT_AMBULATORY_CARE_PROVIDER_SITE_OTHER): Payer: Medicare Other | Admitting: Cardiology

## 2016-10-07 VITALS — BP 130/68 | HR 53 | Ht 67.0 in | Wt 117.0 lb

## 2016-10-07 DIAGNOSIS — R0609 Other forms of dyspnea: Secondary | ICD-10-CM

## 2016-10-07 DIAGNOSIS — I10 Essential (primary) hypertension: Secondary | ICD-10-CM

## 2016-10-07 DIAGNOSIS — I2510 Atherosclerotic heart disease of native coronary artery without angina pectoris: Secondary | ICD-10-CM

## 2016-10-07 DIAGNOSIS — E119 Type 2 diabetes mellitus without complications: Secondary | ICD-10-CM

## 2016-10-07 DIAGNOSIS — I447 Left bundle-branch block, unspecified: Secondary | ICD-10-CM

## 2016-10-07 DIAGNOSIS — R6889 Other general symptoms and signs: Secondary | ICD-10-CM

## 2016-10-07 DIAGNOSIS — I70219 Atherosclerosis of native arteries of extremities with intermittent claudication, unspecified extremity: Secondary | ICD-10-CM

## 2016-10-07 NOTE — Patient Instructions (Addendum)
SCHEDULE AT [facility] has requested that you have an echocardiogram. Echocardiography is a painless test that uses sound waves to create images of your heart. It provides your doctor with information about the size and shape of your heart and how well your heart's chambers and valves are working. This procedure takes approximately one hour. There are no restrictions for this procedure.    SCHEDULE AT [facility] has requested that you have a lexiscan myoview. For further information please visit [email or web address]. Please follow instruction sheet, as given.    Your physician recommends that you schedule a follow-up appointment in 1 MONTH AFTER TEST ARE COMPLETED

## 2016-10-07 NOTE — Progress Notes (Signed)
PCP: [name],[name], MD  Clinic Note: Chief Complaint  Patient presents with  . New Patient (Initial Visit)    pt states no chest pain some dizziness if getting up to fast   . Shortness of Breath    With exertion, excessive tolerance    HPI: [name] is a 82 y.o. female with a PMH below who presents today for Cardiology evaluation for decreased exercise tolerance & exertional dyspnea. All in the setting of ? Fall with L pleural effusion. She has a history of peripheral artery disease status post right femoropopliteal bypass back in 2005 2006. She also has venous stasis with varicose vein ablation in the past.  [name] was referred at the request of her husband,Thomas for evaluation.  Recent Hospitalizations:  UTI in Early October -- followed by fall. Fall (slipped while holding onto her changing table) - hit her L rib cage, chin & head --> L 7th rib Fxr w/ pleural effusion Over last few months, husband has noted increased weakness &malaise (less feisty) -- noted to have a L pleural effusion.  -- since the fall (also hurt her knee), has had to walk with walker, less active & more malaise. By end of November - strength starting to return.   Studies Reviewed:  Chest CT November 8: Moderate to advanced calcific atherosclerotic disease coronary arteries> aorta. A inflation of the lungs, suggest possible COPD. Moderate size left pleural effusion - consider thoracentesis  Interval History: [name] presents here today for evaluation for potentially ischemic etiology or cardiac etiology for her decrease excise tolerance over the last several months. Her husband states that over the last 6 months her energy level has declined quite notably. She's not doing as much around the house as she used to. She is noticing some exertional dyspnea.\He also however has been having a little bit of gait stability, and use a cane up until her recent fall and now she is using a walker. Since her fall back in  October, she had a repeat rib fracture which is bothering her as much is than the left knee hurting. This is deathly served to slow her down some. Today during her evaluation she swears that she feels fine she is doing fine she is bowling back her strength. She started to walk a little bit more. She was to get back out into her garden. She " feels fine and wants to be left alone".  Her husband notes that she really has slowed down a lot and is concerned.  She does note having some orthostatic dizziness or if she moves too fast she feels dizzy. She does feel like is probably safe for her for her to use a walker or a cane to do her walking now. Would prefer to use the cane around the house.  Is not noted any chest tightness or pressure with rest or exertion, but does note some exertional dyspnea. Usually if she does any thing more than just strolling around the house her husband notes that she's been complaining of dyspnea. Her left rib cage hurts from her fall and rib fracture, but this is left lateral chest discomfort and not central substernal pain. Minimal edema but this is been that way ever since her varicose vein treatment in the past. She denies any significant rapid irregular heartbeats or palpitations. She did not have any loss of consciousness related to her fall. This was simply cause she slipped. No syncope/near syncope. No TIA/amaurosis fugax symptoms. No melena, hematochezia, hematuria, or  epstaxis. No claudication.  ROS: A comprehensive was performed. Review of Systems  Constitutional: Positive for malaise/fatigue (Although it appeared that her energy is improved. She did fairly well over Thanksgiving, but after everyone left she was exhausted.). Negative for chills and fever.  HENT: Negative for congestion, hearing loss and nosebleeds.   Respiratory: Positive for shortness of breath. Negative for cough and wheezing.   Cardiovascular:       Per history of present illness    Gastrointestinal: Positive for heartburn. Negative for abdominal pain, blood in stool and melena.  Genitourinary: Negative for dysuria (Treated UTI recently).  Musculoskeletal: Positive for falls (See above) and joint pain (Left knee from her fall). Negative for myalgias.  Skin: Negative.   Neurological: Positive for dizziness. Negative for loss of consciousness.  Endo/Heme/Allergies: Negative for environmental allergies.  Psychiatric/Behavioral: Positive for memory loss. The patient is nervous/anxious.        Is being treated for early stages of dementia. She is somewhat emotionally labile.  All other systems reviewed and are negative.   Past Medical History:  Diagnosis Date  . Alzheimer's dementia without behavioral disturbance    diagnosed 12/2015; Dr. Jade/neurology  . Anemia   . Asthma   . Blood transfusion 05-24-12  . Blood transfusion without reported diagnosis   . Chronic kidney disease    dr Clover Mealy  . Diabetes mellitus   . Diverticulosis   . DVT (deep venous thrombosis) ([place]) 03/2005   hx.left leg  . Esophageal stricture   . GERD (gastroesophageal reflux disease)   . Glaucoma   . Hiatal hernia 05/07/13  . Hyperlipidemia   . Hypertension   . Osteoarthritis   . Pancreatic duct dilated 05/07/13  . Pulmonary nodules 05/07/13  . PVD (peripheral vascular disease) ([place])   . Tubular adenoma of colon     Past Surgical History:  Procedure Laterality Date  . CATARACT EXTRACTION, BILATERAL    . COLONOSCOPY  05/27/2012   Procedure: COLONOSCOPY;  Surgeon: Lafayette Dragon, MD;  Location: [facility];  Service: Endoscopy;  Laterality: N/A;  . ESOPHAGOGASTRODUODENOSCOPY  05/26/2012   Procedure: ESOPHAGOGASTRODUODENOSCOPY (EGD);  Surgeon: Lafayette Dragon, MD;  Location: [facility];  Service: Endoscopy;  Laterality: N/A;  . EUS N/A 07/08/2013   Procedure: UPPER ENDOSCOPIC ULTRASOUND (EUS) LINEAR;  Surgeon: Milus Banister, MD;  Location: [facility];  Service: Endoscopy;  Laterality:  N/A;  need side view scope  . EYE SURGERY    . VASCULAR SURGERY  09/2004  . WRIST FRACTURE SURGERY     R wrist fracture, plate    Current Meds  Medication Sig  . Ascorbic Acid (VITAMIN C) 1000 MG tablet Take 1,000 mg by mouth daily.  Marland Kitchen aspirin 81 MG tablet Take 81 mg by mouth daily.  . beclomethasone (QVAR) 40 MCG/ACT inhaler Inhale 2 puffs into the lungs 2 (two) times daily.  . brimonidine (ALPHAGAN P) 0.1 % SOLN INSTILL 1 DROP IN LEFT EYE TWICE DAILY.  . calcium carbonate (OS-CAL) 600 MG TABS Take 600 mg by mouth 2 (two) times daily with a meal.  . dextrose (GLUTOSE) 40 % GEL Take 1 Tube by mouth once as needed.   . donepezil (ARICEPT) 10 MG tablet Take 1 tablet (10 mg total) by mouth at bedtime.  . famotidine (PEPCID) 20 MG tablet TAKE ONE TABLET AT BEDTIME.  . fenofibrate (TRICOR) 48 MG tablet TAKE 1 TABLET ONCE DAILY.  . ferrous fumarate (HEMOCYTE - 106 MG FE) 325 (106 FE) MG TABS Take  1 tablet by mouth every evening.   . fluticasone (FLONASE) 50 MCG/ACT nasal spray Place 2 sprays into both nostrils daily.  Marland Kitchen levothyroxine (SYNTHROID, LEVOTHROID) 25 MCG tablet TAKE 1 TABLET DAILY BEFORE BREAKFAST  . metFORMIN (GLUCOPHAGE) 500 MG tablet TAKE 1 TABLET ONCE DAILY WITH SUPPER.  . Multiple Vitamin (MULTIVITAMIN WITH MINERALS) TABS tablet Take 1 tablet by mouth daily.  Marland Kitchen PROAIR HFA 108 (90 Base) MCG/ACT inhaler USE 2 PUFFS EVERY 6 HOURS AS NEEDED FOR SHORTNESS OF BREATH.  . [DISCONTINUED] metoprolol succinate (TOPROL-XL) 50 MG 24 hr tablet TAKE 1 TABLET DAILY AFTER A MEAL.    No Known Allergies  Social History   Social History  . Marital status: Married    Spouse name: N/A  . Number of children: N/A  . Years of education: N/A   Social History Main Topics  . Smoking status: Never Smoker  . Smokeless tobacco: Never Used  . Alcohol use No  . Drug use: No  . Sexual activity: Not Asked   Other Topics Concern  . None   Social History Narrative   Marital status: married x 60  years - Husband is Tiphani Villasana, Brooke Bonito (former Teacher, English as a foreign language of [employer]) -- they have been together since she was in 6th grade!!      Children:  4 children; 11 grandchildren; 2 gg      Lives: with husband, dog      Employment: Retired      Tobacco: never      Alcohol: none      Exercise:  Walking daily short distances; previous Firefighter      ADLs:  Walks with cane; cleans and cooks; drives sparingly   Born: [place], [place]   Has 2 years of college.   - at baseline, she likes to do yard work - gardening. Tends to be home alone most of the day as her husband is usually @ work.  family history includes Cancer in her son; Migraines in her daughter. She was adopted.  Wt Readings from Last 3 Encounters:  10/07/16 53.1 kg (117 lb)  09/18/16 53.1 kg (117 lb)  08/19/16 51.7 kg (114 lb)    PHYSICAL EXAM BP 130/68   Pulse (!) 53   Ht 5\' 7"  (1.702 m)   Wt 53.1 kg (117 lb)   BMI 18.32 kg/m  General appearance: alert, cooperative, appears stated age, no distress; thin, but well nourished - well groomed. Neck: no adenopathy, no carotid bruit and no JVD Lungs: clear to auscultation bilaterally, normal percussion bilaterally and non-labored Heart: regular rate and rhythm, S1, S2 normal, no murmur, click, rub or gallop; non-displaced PMI Abdomen: soft, non-tender; bowel sounds normal; no masses,  no organomegaly; no HJR Extremities: extremities normal, atraumatic, no cyanosis, or edema; Scars on R leg (notable more @ knee) from Fem-Pop bypass. Pulses: 2+ and symmetric;  Skin: normal for age.  No rash or major lesions.  Neurologic: Mental status: Alert, oriented, tends to ramble.  Swears that she is doing fine, but is always alone Cranial nerves: normal (II-XII grossly intact)    Adult ECG Report  Rate: 53 ;  Rhythm: sinus bradycardia and LBBB, 1 AVB (PR interval -364).; Heart rate is slower than before. Otherwise durations and voltage  Narrative Interpretation: stable EKG  Other studies  Reviewed: Additional studies/ records that were reviewed today include:  Recent Labs:   Lab Results  Component Value Date   CHOL 152 05/28/2016   HDL 64 05/28/2016   [place]  76 05/28/2016   TRIG 60 05/28/2016   CHOLHDL 2.4 05/28/2016    ASSESSMENT / PLAN: Problem List Items Addressed This Visit    Coronary artery calcification seen on CAT scan (Chronic)    Given her overall decreased exercise tolerance and exertional dyspnea with exertional dyspnea, deathly not unreasonable to evaluate for ischemic coronary disease with a Myoview stress test.  Plan: Lexiscan Myoview She is artery on aspirin and beta blocker along with fenofibrate. She has diabetes on oral medications.      Diabetes ([place])   Relevant Orders   Myocardial Perfusion Imaging   ECHOCARDIOGRAM COMPLETE   HYPERTENSION, BENIGN SYSTEMIC    Well-controlled on current dose of beta blocker. Would not be overly aggressive.      Atherosclerosis of native arteries of extremity with intermittent claudication ([place])    Are all stable with no active claudication symptoms. The presence of PAD years ago (her surgery was in 2005- 2006) as well as CT scan findings calcification of the coronary arteries and aorta increase likelihood of recovering actually obstructive coronary disease.      DOE (dyspnea on exertion) - Primary    Exertional dyspnea seems to improving. This is probably related to her pleural effusion, however the other finding on CT scan was coronary calcification. She does have risk factors with diabetes and known PAD.Marland Kitchen To exclude ischemic coronary disease as the etiology for her excess intolerance and dyspnea on exertion, we'll check a Lexiscan Myoview.   We will also check a 2-D echocardiogram to evaluate for the bundle branch block related reduced ejection fraction.      Relevant Orders   EKG 12-Lead   Myocardial Perfusion Imaging   ECHOCARDIOGRAM COMPLETE   LBBB (left bundle branch block)    Long-standing left  bundle branch block with coronary calcification on CT scan. Plan: Check 2-D echocardiogram and Lexiscan Myoview.      Relevant Orders   EKG 12-Lead   Myocardial Perfusion Imaging   ECHOCARDIOGRAM COMPLETE   Exercise intolerance    Mildly more related to her recent injury and lack of activity. However this seems to have predated her fall. Because of her risk factors, we will evaluate for ischemic coronary disease with a Myoview stress test.      Relevant Orders   EKG 12-Lead   Myocardial Perfusion Imaging   ECHOCARDIOGRAM COMPLETE      Current medicines are reviewed at length with the patient today. (+/- concerns) n/a The following changes have been made: n/a  Patient Instructions  [facility] AT [facility] has requested that you have an echocardiogram. Echocardiography is a painless test that uses sound waves to create images of your heart. It provides your doctor with information about the size and shape of your heart and how well your heart's chambers and valves are working. This procedure takes approximately one hour. There are no restrictions for this procedure.    SCH[facility]ULE AT [facility] has requested that you have a lexiscan myoview. For further information please visit [email or web address]. Please follow instruction sheet, as given.    Your physician recommends that you schedule a follow-up appointment in 1 MONTH AFTER TEST ARE COMPLETED    Studies Ordered:   Orders Placed This Encounter  Procedures  . Myocardial Perfusion Imaging  . EKG 12-Lead  . ECHOCARDIOGRAM COMPLETE      Glenetta Hew, M.D., M.S. Interventional Cardiologist   Pager # [phone] Phone # [phone] [address]  [place]. [place], [place] [address]

## 2016-10-07 NOTE — Telephone Encounter (Signed)
05/2016 last ov for gen check 07/2016 last cmp

## 2016-10-09 ENCOUNTER — Encounter: Payer: Self-pay | Admitting: Cardiology

## 2016-10-09 DIAGNOSIS — R0609 Other forms of dyspnea: Principal | ICD-10-CM

## 2016-10-09 DIAGNOSIS — I447 Left bundle-branch block, unspecified: Secondary | ICD-10-CM | POA: Insufficient documentation

## 2016-10-09 DIAGNOSIS — R6889 Other general symptoms and signs: Secondary | ICD-10-CM | POA: Insufficient documentation

## 2016-10-09 NOTE — Assessment & Plan Note (Signed)
Are all stable with no active claudication symptoms. The presence of PAD years ago (her surgery was in 2005- 2006) as well as CT scan findings calcification of the coronary arteries and aorta increase likelihood of recovering actually obstructive coronary disease.

## 2016-10-09 NOTE — Assessment & Plan Note (Addendum)
Given her overall decreased exercise tolerance and exertional dyspnea with exertional dyspnea, deathly not unreasonable to evaluate for ischemic coronary disease with a Myoview stress test.  Plan: Lexiscan Myoview She is artery on aspirin and beta blocker along with fenofibrate. She has diabetes on oral medications.

## 2016-10-09 NOTE — Assessment & Plan Note (Addendum)
Exertional dyspnea seems to improving. This is probably related to her pleural effusion, however the other finding on CT scan was coronary calcification. She does have risk factors with diabetes and known PAD.Marland Kitchen To exclude ischemic coronary disease as the etiology for her excess intolerance and dyspnea on exertion, we'll check a Lexiscan Myoview.   We will also check a 2-D echocardiogram to evaluate for the bundle branch block related reduced ejection fraction.

## 2016-10-09 NOTE — Assessment & Plan Note (Signed)
Well-controlled on current dose of beta blocker. Would not be overly aggressive.

## 2016-10-09 NOTE — Assessment & Plan Note (Signed)
Mildly more related to her recent injury and lack of activity. However this seems to have predated her fall. Because of her risk factors, we will evaluate for ischemic coronary disease with a Myoview stress test.

## 2016-10-09 NOTE — Assessment & Plan Note (Signed)
Long-standing left bundle branch block with coronary calcification on CT scan. Plan: Check 2-D echocardiogram and Lexiscan Myoview.

## 2016-10-15 ENCOUNTER — Other Ambulatory Visit: Payer: Self-pay | Admitting: Family Medicine

## 2016-10-15 DIAGNOSIS — F0280 Dementia in other diseases classified elsewhere without behavioral disturbance: Secondary | ICD-10-CM

## 2016-10-15 DIAGNOSIS — G301 Alzheimer's disease with late onset: Principal | ICD-10-CM

## 2016-10-21 HISTORY — PX: TRANSTHORACIC ECHOCARDIOGRAM: SHX275

## 2016-10-21 HISTORY — PX: NM MYOVIEW LTD: HXRAD82

## 2016-10-23 ENCOUNTER — Ambulatory Visit: Payer: Self-pay | Admitting: Family Medicine

## 2016-10-25 ENCOUNTER — Ambulatory Visit (HOSPITAL_COMMUNITY)
Admission: RE | Admit: 2016-10-25 | Discharge: 2016-10-25 | Disposition: A | Discharge status: Home / Self Care | Payer: Medicare Other | Source: Ambulatory Visit | Attending: Internal Medicine | Admitting: Internal Medicine

## 2016-10-25 DIAGNOSIS — R5383 Other fatigue: Secondary | ICD-10-CM | POA: Diagnosis not present

## 2016-10-25 DIAGNOSIS — R0609 Other forms of dyspnea: Secondary | ICD-10-CM | POA: Insufficient documentation

## 2016-10-25 DIAGNOSIS — E1151 Type 2 diabetes mellitus with diabetic peripheral angiopathy without gangrene: Secondary | ICD-10-CM | POA: Insufficient documentation

## 2016-10-25 DIAGNOSIS — R42 Dizziness and giddiness: Secondary | ICD-10-CM | POA: Diagnosis not present

## 2016-10-25 DIAGNOSIS — R6889 Other general symptoms and signs: Secondary | ICD-10-CM | POA: Insufficient documentation

## 2016-10-25 DIAGNOSIS — G309 Alzheimer's disease, unspecified: Secondary | ICD-10-CM | POA: Diagnosis not present

## 2016-10-25 DIAGNOSIS — I2510 Atherosclerotic heart disease of native coronary artery without angina pectoris: Secondary | ICD-10-CM | POA: Diagnosis not present

## 2016-10-25 DIAGNOSIS — F0280 Dementia in other diseases classified elsewhere without behavioral disturbance: Secondary | ICD-10-CM | POA: Insufficient documentation

## 2016-10-25 DIAGNOSIS — R0602 Shortness of breath: Secondary | ICD-10-CM | POA: Insufficient documentation

## 2016-10-25 DIAGNOSIS — I447 Left bundle-branch block, unspecified: Secondary | ICD-10-CM

## 2016-10-25 DIAGNOSIS — I10 Essential (primary) hypertension: Secondary | ICD-10-CM | POA: Insufficient documentation

## 2016-10-25 DIAGNOSIS — E119 Type 2 diabetes mellitus without complications: Secondary | ICD-10-CM

## 2016-10-25 LAB — MYOCARDIAL PERFUSION IMAGING
CHL CUP NUCLEAR SDS: 1
CHL CUP RESTING HR STRESS: 57 {beats}/min
CSEPPHR: 80 {beats}/min
LV dias vol: 49 mL (ref 46–106)
LV sys vol: 31 mL
NUC STRESS TID: 1.12
SRS: 3
SSS: 4

## 2016-10-25 MED ORDER — REGADENOSON 0.4 MG/5ML IV SOLN
0.4000 mg | Freq: Once | INTRAVENOUS | Status: AC
  Administered 2016-10-25: 0.4 mg via INTRAVENOUS

## 2016-10-25 MED ORDER — AMINOPHYLLINE 25 MG/ML IV SOLN
75.0000 mg | Freq: Once | INTRAVENOUS | Status: AC
  Administered 2016-10-25: 75 mg via INTRAVENOUS

## 2016-10-25 MED ORDER — TECHNETIUM TC 99M TETROFOSMIN IV KIT
9.8000 | PACK | Freq: Once | INTRAVENOUS | Status: AC | PRN
  Administered 2016-10-25: 9.8 via INTRAVENOUS
  Filled 2016-10-25: qty 10

## 2016-10-25 MED ORDER — TECHNETIUM TC 99M TETROFOSMIN IV KIT
30.4000 | PACK | Freq: Once | INTRAVENOUS | Status: AC | PRN
  Administered 2016-10-25: 30.4 via INTRAVENOUS
  Filled 2016-10-25: qty 31

## 2016-11-04 ENCOUNTER — Other Ambulatory Visit (HOSPITAL_COMMUNITY): Payer: Medicare Other

## 2016-11-20 ENCOUNTER — Ambulatory Visit (HOSPITAL_COMMUNITY): Payer: Medicare Other | Attending: Cardiovascular Disease

## 2016-11-20 ENCOUNTER — Other Ambulatory Visit: Payer: Self-pay

## 2016-11-20 DIAGNOSIS — R0609 Other forms of dyspnea: Secondary | ICD-10-CM | POA: Diagnosis not present

## 2016-11-20 DIAGNOSIS — I340 Nonrheumatic mitral (valve) insufficiency: Secondary | ICD-10-CM | POA: Insufficient documentation

## 2016-11-20 DIAGNOSIS — I10 Essential (primary) hypertension: Secondary | ICD-10-CM | POA: Diagnosis not present

## 2016-11-20 DIAGNOSIS — E785 Hyperlipidemia, unspecified: Secondary | ICD-10-CM | POA: Insufficient documentation

## 2016-11-20 DIAGNOSIS — E119 Type 2 diabetes mellitus without complications: Secondary | ICD-10-CM | POA: Insufficient documentation

## 2016-11-20 DIAGNOSIS — I447 Left bundle-branch block, unspecified: Secondary | ICD-10-CM

## 2016-11-20 DIAGNOSIS — R9431 Abnormal electrocardiogram [ECG] [EKG]: Secondary | ICD-10-CM | POA: Diagnosis present

## 2016-11-20 DIAGNOSIS — R6889 Other general symptoms and signs: Secondary | ICD-10-CM | POA: Diagnosis not present

## 2016-11-20 DIAGNOSIS — I351 Nonrheumatic aortic (valve) insufficiency: Secondary | ICD-10-CM | POA: Insufficient documentation

## 2016-11-20 DIAGNOSIS — R0600 Dyspnea, unspecified: Secondary | ICD-10-CM | POA: Diagnosis present

## 2016-11-29 ENCOUNTER — Encounter: Payer: Self-pay | Admitting: Cardiology

## 2016-11-29 ENCOUNTER — Ambulatory Visit (INDEPENDENT_AMBULATORY_CARE_PROVIDER_SITE_OTHER): Payer: Medicare Other | Admitting: Cardiology

## 2016-11-29 VITALS — BP 154/76 | HR 64 | Ht 67.5 in | Wt 115.2 lb

## 2016-11-29 DIAGNOSIS — R6889 Other general symptoms and signs: Secondary | ICD-10-CM | POA: Diagnosis not present

## 2016-11-29 DIAGNOSIS — I10 Essential (primary) hypertension: Secondary | ICD-10-CM

## 2016-11-29 DIAGNOSIS — I2510 Atherosclerotic heart disease of native coronary artery without angina pectoris: Secondary | ICD-10-CM | POA: Diagnosis not present

## 2016-11-29 DIAGNOSIS — I872 Venous insufficiency (chronic) (peripheral): Secondary | ICD-10-CM

## 2016-11-29 DIAGNOSIS — R0609 Other forms of dyspnea: Secondary | ICD-10-CM | POA: Diagnosis not present

## 2016-11-29 DIAGNOSIS — I447 Left bundle-branch block, unspecified: Secondary | ICD-10-CM | POA: Diagnosis not present

## 2016-11-29 NOTE — Patient Instructions (Addendum)
  INCREASE EXERCISE YOU MAY WALK AT LEAST TWICE AROUND YOUR HOME   EVERY 2 HOURS  NO OTHER CHANGES WITH MEDICATION OR TREATMENT    Your physician wants you to follow-up in [place]. You will receive a reminder letter in the mail two months in advance. If you don't receive a letter, please call our office to schedule the follow-up appointment.

## 2016-11-29 NOTE — Progress Notes (Signed)
PCP: [name],[name], MD  Clinic Note: Chief Complaint  Patient presents with  . Follow-up    1 month; follow-up studies to assess exertional dyspnea and atypical chest pain    HPI: [name] is a 83 y.o. female with a PMH below who presents today for Cardiology evaluation for decreased exercise tolerance & exertional dyspnea. All in the setting of ? Fall with L pleural effusion. She has a history of peripheral artery disease status post right femoropopliteal bypass back in 2005 2006. She also has venous stasis with varicose vein ablation in the past.  [name] was referred at the request of her husband,[name] for evaluation.  Recent Hospitalizations:  UTI in Early October -- followed by fall. Fall (slipped while holding onto her changing table) - hit her L rib cage, chin & head --> L 7th rib Fxr w/ pleural effusion Over last few months, husband has noted increased weakness &malaise (less feisty) -- noted to have a L pleural effusion.  -- since the fall (also hurt her knee), has had to walk with walker, less active & more malaise. By end of November - strength starting to return.   Studies Reviewed:  Chest CT November 8: Moderate to advanced calcific atherosclerotic disease coronary arteries> aorta. A inflation of the lungs, suggest possible COPD. Moderate size left pleural effusion - consider thoracentesis   Myoview January 2018: Read as intermediate risk due to EF of 38%.Anemia or infarction noted. No regional wall motion abnormality. This abnormal septal motion.  2-D Echocardiogram: EF 45-50% with possible inferior hypokinesis. Aortic sclerosis without stenosis.  Interval History: [name] presents here today for Follow-up, and his usual perseverates more on how he can't wait to weather changes for her to go get outside. She is not a very good historian, and really has no major complaints at all from a cardiac standpoint. He is still very limited as far as how much walking around  she does ever since her fall, but has not had any real heart failure or angina type symptoms with rest or exertion. We discussed her Myoview and echocardiogram results and it would seem that there is a suggestion of some possible wall motion abnormality is a reduced ejection fraction. Most likely this is consistent with her bundle branch block. Thankfully there was no evidence of any ischemia or infarction.  Her energy level is not wear her husband and daughter would like it to be, and she is still not really doing as much as they would like and she spends more time sitting. However she is not noted any significant edema, PND or orthopnea.   for potentially ischemic etiology or cardiac etiology for her decrease excise tolerance over the last several months. Her husband states that over the last 6 months her energy level has declined quite notably. She's not doing as much around the house as she used to. She is noticing some exertional dyspnea.\He also however has been having a little bit of gait stability, and use a cane up until her recent fall and now she is using a walker. Since her fall back in October, she had a repeat rib fracture which is bothering her as much is than the left knee hurting. This is deathly served to slow her down some. Today during her evaluation she swears that she feels fine she is doing fine she is bowling back her strength. She started to walk a little bit more. She was to get back out into her garden. She " feels fine  and wants to be left alone".  Her husband notes that she really has slowed down a lot and is concerned.  She does note having some orthostatic dizziness or if she moves too fast she feels dizzy. She does feel like is probably safe for her for her to use a walker or a cane to do her walking now. Would prefer to use the cane around the house. She is quite deconditioned and does complain of some shortness of breath. Her ribs seem to be improving.  Minimal edema but  this is been that way ever since her varicose vein treatment in the past. She denies any significant rapid irregular heartbeats or palpitations.  No syncope/near syncope. No TIA/amaurosis fugax symptoms. No melena, hematochezia, hematuria, or epstaxis. No claudication.  ROS: A comprehensive was performed. Review of Systems  Constitutional: Positive for malaise/fatigue (Although it appeared that her energy is improved. Just not back to baseline). Negative for chills and fever.  HENT: Negative for congestion, hearing loss and nosebleeds.   Respiratory: Positive for shortness of breath (Better.). Negative for cough and wheezing.   Cardiovascular:       Per history of present illness  Gastrointestinal: Positive for heartburn. Negative for abdominal pain, blood in stool and melena.  Genitourinary: Negative for dysuria (Treated UTI recently).  Musculoskeletal: Positive for falls (See above) and joint pain (Left knee from her fall). Negative for myalgias.       Rib pain has improved  Skin: Negative.   Neurological: Positive for dizziness. Negative for loss of consciousness.  Endo/Heme/Allergies: Negative for environmental allergies.  Psychiatric/Behavioral: Positive for memory loss. The patient is nervous/anxious.        Is being treated for early stages of dementia. She is somewhat emotionally labile.  All other systems reviewed and are negative.   Past Medical History:  Diagnosis Date  . Alzheimer's dementia without behavioral disturbance    diagnosed 12/2015; Dr. Jade/neurology  . Anemia   . Asthma   . Blood transfusion 05-24-12  . Blood transfusion without reported diagnosis   . Chronic kidney disease    dr Clover Mealy  . Diabetes mellitus   . Diverticulosis   . DVT (deep venous thrombosis) ([place]) 03/2005   hx.left leg  . Esophageal stricture   . GERD (gastroesophageal reflux disease)   . Glaucoma   . Hiatal hernia 05/07/13  . Hyperlipidemia   . Hypertension   . Osteoarthritis   .  Pancreatic duct dilated 05/07/13  . Pulmonary nodules 05/07/13  . PVD (peripheral vascular disease) ([place])   . Tubular adenoma of colon     Past Surgical History:  Procedure Laterality Date  . CATARACT EXTRACTION, BILATERAL    . COLONOSCOPY  05/27/2012   Procedure: COLONOSCOPY;  Surgeon: Lafayette Dragon, MD;  Location: [facility];  Service: Endoscopy;  Laterality: N/A;  . ESOPHAGOGASTRODUODENOSCOPY  05/26/2012   Procedure: ESOPHAGOGASTRODUODENOSCOPY (EGD);  Surgeon: Lafayette Dragon, MD;  Location: [facility];  Service: Endoscopy;  Laterality: N/A;  . EUS N/A 07/08/2013   Procedure: UPPER ENDOSCOPIC ULTRASOUND (EUS) LINEAR;  Surgeon: Milus Banister, MD;  Location: [facility];  Service: Endoscopy;  Laterality: N/A;  need side view scope  . EYE SURGERY    . VASCULAR SURGERY  09/2004  . WRIST FRACTURE SURGERY     R wrist fracture, plate    Current Meds  Medication Sig  . Ascorbic Acid (VITAMIN C) 1000 MG tablet Take 1,000 mg by mouth daily.  Marland Kitchen aspirin 81 MG tablet Take  81 mg by mouth daily.  . beclomethasone (QVAR) 40 MCG/ACT inhaler Inhale 2 puffs into the lungs 2 (two) times daily.  . brimonidine (ALPHAGAN P) 0.1 % SOLN INSTILL 1 DROP IN LEFT EYE TWICE DAILY.  . calcium carbonate (OS-CAL) 600 MG TABS Take 600 mg by mouth 2 (two) times daily with a meal.  . dextrose (GLUTOSE) 40 % GEL Take 1 Tube by mouth once as needed.   . donepezil (ARICEPT) 10 MG tablet TAKE ONE TABLET AT BEDTIME.  . famotidine (PEPCID) 20 MG tablet TAKE ONE TABLET AT BEDTIME.  . fenofibrate (TRICOR) 48 MG tablet TAKE 1 TABLET ONCE DAILY.  . ferrous fumarate (HEMOCYTE - 106 MG FE) 325 (106 FE) MG TABS Take 1 tablet by mouth every evening.   . fluticasone (FLONASE) 50 MCG/ACT nasal spray Place 2 sprays into both nostrils daily.  Marland Kitchen levothyroxine (SYNTHROID, LEVOTHROID) 25 MCG tablet TAKE 1 TABLET DAILY BEFORE BREAKFAST  . metFORMIN (GLUCOPHAGE) 500 MG tablet TAKE 1 TABLET ONCE DAILY WITH SUPPER.  . metoprolol succinate  (TOPROL-XL) 50 MG 24 hr tablet TAKE 1 TABLET DAILY AFTER A MEAL.  . Multiple Vitamin (MULTIVITAMIN WITH MINERALS) TABS tablet Take 1 tablet by mouth daily.  Marland Kitchen PROAIR HFA 108 (90 Base) MCG/ACT inhaler USE 2 PUFFS EVERY 6 HOURS AS NEEDED FOR SHORTNESS OF BREATH.    No Known Allergies  Social History   Social History  . Marital status: Married    Spouse name: N/A  . Number of children: N/A  . Years of education: N/A   Social History Main Topics  . Smoking status: Never Smoker  . Smokeless tobacco: Never Used  . Alcohol use No  . Drug use: No  . Sexual activity: Not Asked   Other Topics Concern  . None   Social History Narrative   Marital status: married x 60 years - Husband is [name], Brooke Bonito (former Teacher, English as a foreign language of [employer]) -- they have been together since she was in 6th grade!!      Children:  4 children; 11 grandchildren; 2 gg      Lives: with husband, dog      Employment: Retired      Tobacco: never      Alcohol: none      Exercise:  Walking daily short distances; previous Firefighter      ADLs:  Walks with cane; cleans and cooks; drives sparingly   Born: [place], [place]   Has 2 years of college.   - at baseline, she likes to do yard work - gardening. Tends to be home alone most of the day as her husband is usually @ work.  family history includes Cancer in her son; Migraines in her daughter. She was adopted.  Wt Readings from Last 3 Encounters:  11/29/16 52.3 kg (115 lb 3.2 oz)  10/25/16 53.1 kg (117 lb)  10/07/16 53.1 kg (117 lb)    PHYSICAL EXAM BP (!) 154/76   Pulse 64   Ht 5' 7.5" (1.715 m)   Wt 52.3 kg (115 lb 3.2 oz)   BMI 17.78 kg/m  General appearance: alert, cooperative, appears stated age, no distress; thin, but well nourished - well groomed. Neck: no adenopathy, no carotid bruit and no JVD Lungs: clear to auscultation bilaterally, normal percussion bilaterally and non-labored Heart: regular rate and rhythm, S1, S2 normal, no murmur, click, rub or  gallop; non-displaced PMI Abdomen: soft, non-tender; bowel sounds normal; no masses,  no organomegaly; no HJR Extremities: extremities normal, atraumatic,  no cyanosis, or edema; Scars on R leg (notable more @ knee) from Fem-Pop bypass. Pulses: 2+ and symmetric;  Neurologic: Mental status: Alert, oriented, tends to ramble.  Swears that she is doing fine, But perseverates on various important issues.    Adult ECG Report n/a  Other studies Reviewed: Additional studies/ records that were reviewed today include:  Recent Labs:   Lab Results  Component Value Date   CHOL 152 05/28/2016   HDL 64 05/28/2016   LDLCALC 76 05/28/2016   TRIG 60 05/28/2016   CHOLHDL 2.4 05/28/2016    ASSESSMENT / PLAN: Problem List Items Addressed This Visit    Coronary artery calcification seen on CAT scan - Primary (Chronic)    No evidence of ischemia noted on Lexiscan Myoview. Reduced EF is probably related to the bundle branch block and not a true finding as her echocardiogram showed that the EF is pretty much low normal.  Without her having any significant anginal symptoms at think we will simply just treat expectantly with risk factor modification.  Continue aspirin, beta blocker and fenofibrate along with oral diabetic medication.      HYPERTENSION, BENIGN SYSTEMIC (Chronic)    At this point, I would prefer to allow little bit permissive hypertension to avoid any dizziness or wooziness. I would not be overly aggressive treating. Although a little higher than I would like to be, her current pressures probably safer than trying to issue for lower pressures.      Venous (peripheral) insufficiency (Chronic)    Pretty stable overall. I'm not sure how well she would do wearing support stockings, but I have suggested in the past.      LBBB (left bundle branch block) (Chronic)    I think this is probably the reason why her EF appears to be down. The septal motion does off EF in Myoview stress test. She's  not having any heart failure symptoms and her EF is relatively preserved.  Probably simply natural aging as opposed to an ischemic finding.      DOE (dyspnea on exertion)    Probably this is due to deconditioning and the fact that she is now not doing much of anything physical. She also was troubled for a while by her rib injury.  Would not pursue any further aggressive cardiac evaluation unless she has worsening symptoms.      Exercise intolerance    She was already prone to deconditioning and then after her fall, she is now scared to get up more around. I recommended that she tries to do laps around the house at least 2 times around the inside of her house per hour. He should also start getting back into some of her routine chores such as doing dishes and cleaning. Hopefully when the weather improves she can get back into the garden.        Although straightforward clinical decision making, I spent close to half an hour with the patient and family. Greater than 50% of the times was spent in discussing the results of her tests as well as my feelings on the fact that I don't think her symptoms are very much related to cardiac issues but that she needs to gradually progress back into a more normal activity level.   Current medicines are reviewed at length with the patient today. (+/- concerns) n/a The following changes have been made: n/a  Patient Instructions   INCREASE EXERCISE YOU MAY [place]  2 HOURS  NO OTHER CHANGES WITH MEDICATION OR TREATMENT    Your physician wants you to follow-up in [place] DR Shep [place]. You will receive a reminder letter in the mail two months in advance. If you don't receive a letter, please call our office to schedule the follow-up appointment.      Studies Ordered:   No orders of the defined types were placed in this encounter.     Glenetta Hew, M.D., M.S. Interventional Cardiologist   Pager #  [phone] Phone # [phone] [address]. [place], [place] [address]

## 2016-12-01 ENCOUNTER — Encounter: Payer: Self-pay | Admitting: Cardiology

## 2016-12-01 NOTE — Assessment & Plan Note (Signed)
Pretty stable overall. I'm not sure how well she would do wearing support stockings, but I have suggested in the past.

## 2016-12-01 NOTE — Assessment & Plan Note (Addendum)
At this point, I would prefer to allow little bit permissive hypertension to avoid any dizziness or wooziness. I would not be overly aggressive treating. Although a little higher than I would like to be, her current pressures probably safer than trying to issue for lower pressures.

## 2016-12-01 NOTE — Assessment & Plan Note (Signed)
Probably this is due to deconditioning and the fact that she is now not doing much of anything physical. She also was troubled for a while by her rib injury.  Would not pursue any further aggressive cardiac evaluation unless she has worsening symptoms.

## 2016-12-01 NOTE — Assessment & Plan Note (Signed)
She was already prone to deconditioning and then after her fall, she is now scared to get up more around. I recommended that she tries to do laps around the house at least 2 times around the inside of her house per hour. He should also start getting back into some of her routine chores such as doing dishes and cleaning. Hopefully when the weather improves she can get back into the garden.

## 2016-12-01 NOTE — Assessment & Plan Note (Signed)
I think this is probably the reason why her EF appears to be down. The septal motion does off EF in Myoview stress test. She's not having any heart failure symptoms and her EF is relatively preserved.  Probably simply natural aging as opposed to an ischemic finding.

## 2016-12-01 NOTE — Assessment & Plan Note (Signed)
No evidence of ischemia noted on Lexiscan Myoview. Reduced EF is probably related to the bundle branch block and not a true finding as her echocardiogram showed that the EF is pretty much low normal.  Without her having any significant anginal symptoms at think we will simply just treat expectantly with risk factor modification.  Continue aspirin, beta blocker and fenofibrate along with oral diabetic medication.

## 2016-12-15 ENCOUNTER — Other Ambulatory Visit: Payer: Self-pay | Admitting: Family Medicine

## 2016-12-28 ENCOUNTER — Other Ambulatory Visit: Payer: Self-pay | Admitting: Family Medicine

## 2016-12-29 ENCOUNTER — Other Ambulatory Visit: Payer: Self-pay | Admitting: Family Medicine

## 2017-01-26 ENCOUNTER — Other Ambulatory Visit: Payer: Self-pay | Admitting: Family Medicine

## 2017-02-27 ENCOUNTER — Other Ambulatory Visit: Payer: Self-pay | Admitting: Family Medicine

## 2017-02-28 NOTE — Telephone Encounter (Signed)
Call --- pt is due for six month follow-up with me this month; please schedule follow-up office visit with me in upcoming 30 days.

## 2017-03-24 ENCOUNTER — Other Ambulatory Visit: Payer: Self-pay | Admitting: Family Medicine

## 2017-03-24 DIAGNOSIS — F0280 Dementia in other diseases classified elsewhere without behavioral disturbance: Secondary | ICD-10-CM

## 2017-03-24 DIAGNOSIS — G301 Alzheimer's disease with late onset: Principal | ICD-10-CM

## 2017-03-30 ENCOUNTER — Other Ambulatory Visit: Payer: Self-pay | Admitting: Family Medicine

## 2017-03-31 NOTE — Telephone Encounter (Signed)
Please call patient --- She is due for six month follow-up thus I only approved two months of Metformin and Fenofibrate.  Please schedule an appointment with me in the upcoming two months.

## 2017-04-01 ENCOUNTER — Telehealth: Payer: Self-pay | Admitting: Family Medicine

## 2017-04-01 NOTE — Telephone Encounter (Signed)
lmom to call an schedule an OV for a over due 6 month f/u

## 2017-04-02 NOTE — Telephone Encounter (Signed)
Patient has been called 3 times and 3 messages have been left for her to call back to make a follow up appointment.

## 2017-04-02 NOTE — Telephone Encounter (Signed)
Overdue for six month follow-up; I approved two months only of Metoprolol. Please call pt to schedule a follow-up with me in the upcoming two months.

## 2017-04-02 NOTE — Telephone Encounter (Signed)
Thank you for your attention/response.  Please sent patient a letter stating that we have attempted to contact her three times to schedule a follow-up appointment and to call the office to schedule a follow-up appointment.  Also, you may attempt to call pt's cell phone or work phone.

## 2017-04-03 ENCOUNTER — Telehealth: Payer: Self-pay | Admitting: Family Medicine

## 2017-04-03 NOTE — Telephone Encounter (Signed)
Letter sent and all the numbers on the chart have been called

## 2017-04-03 NOTE — Telephone Encounter (Signed)
PATIENT'S HUSBAND (TOM) WOULD LIKE DR. Tamala Julian TO KNOW THAT HIS WIFE'S DEMENTIA IS STARTING TO GET WORSE. SHE IS FEELING MORE FRUSTRATED AND SHE USES CUSSING WORDS NOW. HER NOSE IS RUNNING CONSTANTLY. SHE HAS NOT DROVE HER CAR SINCE OCT. OF 2017. SHE KEEPS SAYING SHE IS GOING TO DRIVE AND HE IS NOT SURE HOW MUCH LONGER THEY CAN KEEP HER FROM TRYING TO DO THIS. (SHE HAS AN APPOINTMENT TO SEE DR.  Tamala Julian ON FRI. 04/18/17 AT [address]) BEST PHONE [phone] (CELL) [place]. [place]

## 2017-04-07 NOTE — Telephone Encounter (Signed)
Pls Advise!

## 2017-04-07 NOTE — Telephone Encounter (Signed)
Noted.  Please let husband know I appreciate his call with updates.   Please have him write out a list of concerns in the form of a letter to give to me, as I understand that these issues can be challenging to address during the visit in front of the patient.

## 2017-04-08 NOTE — Telephone Encounter (Signed)
I spoke with Mr Belzer and advised him of your message and he stated he will also have his daughter contribute to that letter and bring it by.

## 2017-04-12 ENCOUNTER — Other Ambulatory Visit: Payer: Self-pay | Admitting: Physician Assistant

## 2017-04-18 ENCOUNTER — Encounter: Payer: Self-pay | Admitting: Family Medicine

## 2017-04-18 ENCOUNTER — Other Ambulatory Visit: Payer: Self-pay | Admitting: Family Medicine

## 2017-04-18 ENCOUNTER — Other Ambulatory Visit: Payer: Self-pay | Admitting: Internal Medicine

## 2017-04-18 ENCOUNTER — Ambulatory Visit (INDEPENDENT_AMBULATORY_CARE_PROVIDER_SITE_OTHER): Payer: Medicare Other | Admitting: Family Medicine

## 2017-04-18 VITALS — BP 170/70 | HR 55 | Temp 98.6°F | Resp 16 | Ht 67.5 in | Wt 123.0 lb

## 2017-04-18 DIAGNOSIS — I2510 Atherosclerotic heart disease of native coronary artery without angina pectoris: Secondary | ICD-10-CM | POA: Diagnosis not present

## 2017-04-18 DIAGNOSIS — G301 Alzheimer's disease with late onset: Secondary | ICD-10-CM

## 2017-04-18 DIAGNOSIS — F0281 Dementia in other diseases classified elsewhere with behavioral disturbance: Secondary | ICD-10-CM

## 2017-04-18 DIAGNOSIS — J301 Allergic rhinitis due to pollen: Secondary | ICD-10-CM

## 2017-04-18 DIAGNOSIS — J4540 Moderate persistent asthma, uncomplicated: Secondary | ICD-10-CM | POA: Diagnosis not present

## 2017-04-18 DIAGNOSIS — E119 Type 2 diabetes mellitus without complications: Secondary | ICD-10-CM | POA: Diagnosis not present

## 2017-04-18 DIAGNOSIS — I10 Essential (primary) hypertension: Secondary | ICD-10-CM | POA: Diagnosis not present

## 2017-04-18 DIAGNOSIS — E785 Hyperlipidemia, unspecified: Secondary | ICD-10-CM

## 2017-04-18 DIAGNOSIS — M7989 Other specified soft tissue disorders: Secondary | ICD-10-CM | POA: Diagnosis not present

## 2017-04-18 DIAGNOSIS — N39490 Overflow incontinence: Secondary | ICD-10-CM | POA: Diagnosis not present

## 2017-04-18 LAB — POCT URINALYSIS DIP (MANUAL ENTRY)
BILIRUBIN UA: NEGATIVE
BILIRUBIN UA: NEGATIVE mg/dL
Blood, UA: NEGATIVE
Glucose, UA: NEGATIVE mg/dL
LEUKOCYTES UA: NEGATIVE
Nitrite, UA: NEGATIVE
Spec Grav, UA: 1.02 (ref 1.010–1.025)
Urobilinogen, UA: 0.2 E.U./dL
pH, UA: 7 (ref 5.0–8.0)

## 2017-04-18 MED ORDER — CITALOPRAM HYDROBROMIDE 10 MG PO TABS: 10.0000 mg | ORAL_TABLET | Freq: Every day | ORAL | 1 refills | Status: DC

## 2017-04-18 MED ORDER — METOPROLOL TARTRATE 25 MG PO TABS: 25.0000 mg | ORAL_TABLET | Freq: Two times a day (BID) | ORAL | 3 refills | Status: DC

## 2017-04-18 NOTE — Progress Notes (Signed)
Subjective:    Patient ID: [name], female    DOB: 1931-09-13, 83 y.o.   MRN: [id number]  04/18/2017  Follow-up; Diabetes; Hypertension; and Dementia   HPI This 83 y.o. female presents with daughter  for follow-up for seven month follow-up memory loss/dementia, hypertension.  Patient reports good compliance with medication, good tolerance to medication, and good symptom control.    Not checking blood sugar; taking Metformin.   Sleeping in the den.  Has a walker and potty chair.   Sleeping downstairs now since fall six months ago.  Family reports that patient is becoming irritable and uses profanity daily which is highly unusual for pt.  More tearful than in the past; has always been tender hearted.  Asks the same question over and over to family.  Not always recognizes children.  Having urinary incontinence now.  Has not driven since October 2017 and very agitated that cannot drive; loss of independence.   RLE swelling localized posterior leg: no pain; no redness.  Daughter noticed when putting on her lotion the other day; did undergo vascular surgery in that leg.  Hx of DVT LEFT leg.   MMSE - Mini Mental State Exam 04/18/2017  Orientation to time 5  Orientation to Place 5  Registration 1  Attention/ Calculation 0  Recall 1  Language- name 2 objects 2  Language- repeat 1  Language- follow 3 step command 3  Language- read & follow direction 1  Write a sentence 1  Copy design 1  Total score 21     BP Readings from Last 3 Encounters:  04/18/17 (!) 170/70  11/29/16 (!) 154/76  10/07/16 130/68   Wt Readings from Last 3 Encounters:  04/18/17 123 lb (55.8 kg)  11/29/16 115 lb 3.2 oz (52.3 kg)  10/25/16 117 lb (53.1 kg)   Immunization History  Administered Date(s) Administered  . Influenza Split 08/21/2012, 08/22/2015  . Influenza,inj,Quad PF,36+ Mos 08/19/2016  . Influenza-Unspecified 08/21/2013  . Pneumococcal Conjugate-13 10/06/2014  . Pneumococcal Polysaccharide-23  08/21/2005  . Td 02/19/2004  . Zoster 06/21/2012     Review of Systems  Constitutional: Negative for chills, diaphoresis, fatigue and fever.  HENT: Positive for rhinorrhea.   Eyes: Negative for visual disturbance.  Respiratory: Negative for cough and shortness of breath.   Cardiovascular: Negative for chest pain, palpitations and leg swelling.  Gastrointestinal: Negative for abdominal pain, constipation, diarrhea, nausea and vomiting.  Endocrine: Negative for cold intolerance, heat intolerance, polydipsia, polyphagia and polyuria.  Genitourinary: Positive for frequency and urgency. Negative for difficulty urinating, dyspareunia, dysuria, enuresis, flank pain, hematuria and pelvic pain.  Neurological: Negative for dizziness, tremors, seizures, syncope, facial asymmetry, speech difficulty, weakness, light-headedness, numbness and headaches.  Psychiatric/Behavioral: Positive for behavioral problems, confusion and dysphoric mood.    Past Medical History:  Diagnosis Date  . Alzheimer's dementia without behavioral disturbance    diagnosed 12/2015; Dr. Jade/neurology  . Anemia   . Asthma   . Blood transfusion 05-24-12  . Blood transfusion without reported diagnosis   . Chronic kidney disease    dr Clover Mealy  . Diabetes mellitus   . Diverticulosis   . DVT (deep venous thrombosis) ([place]) 03/2005   hx.left leg  . Esophageal stricture   . GERD (gastroesophageal reflux disease)   . Glaucoma   . Hiatal hernia 05/07/13  . Hyperlipidemia   . Hypertension   . Osteoarthritis   . Pancreatic duct dilated 05/07/13  . Pulmonary nodules 05/07/13  . PVD (peripheral vascular disease) ([place])   .  Tubular adenoma of colon    Past Surgical History:  Procedure Laterality Date  . CATARACT EXTRACTION, BILATERAL    . COLONOSCOPY  05/27/2012   Procedure: COLONOSCOPY;  Surgeon: Lafayette Dragon, MD;  Location: [facility];  Service: Endoscopy;  Laterality: N/A;  . ESOPHAGOGASTRODUODENOSCOPY  05/26/2012    Procedure: ESOPHAGOGASTRODUODENOSCOPY (EGD);  Surgeon: Lafayette Dragon, MD;  Location: [facility];  Service: Endoscopy;  Laterality: N/A;  . EUS N/A 07/08/2013   Procedure: UPPER ENDOSCOPIC ULTRASOUND (EUS) LINEAR;  Surgeon: Milus Banister, MD;  Location: [facility];  Service: Endoscopy;  Laterality: N/A;  need side view scope  . EYE SURGERY    . VASCULAR SURGERY  09/2004  . WRIST FRACTURE SURGERY     R wrist fracture, plate   No Known Allergies  Social History   Social History  . Marital status: Married    Spouse name: N/A  . Number of children: N/A  . Years of education: N/A   Occupational History  . Not on file.   Social History Main Topics  . Smoking status: Never Smoker  . Smokeless tobacco: Never Used  . Alcohol use No  . Drug use: No  . Sexual activity: Not on file   Other Topics Concern  . Not on file   Social History Narrative   Marital status: married x 60 years - Husband is Carmalita Wakefield, Brooke Bonito (former Teacher, English as a foreign language of [employer]) -- they have been together since she was in 6th grade!!      Children:  4 children; 11 grandchildren; 2 gg      Lives: with husband, dog      Employment: Retired      Tobacco: never      Alcohol: none      Exercise:  Walking daily short distances; previous Firefighter      ADLs:  Walks with cane; cleans and cooks; drives sparingly   Born: [place], [place]   Has 2 years of college.    Family History  Problem Relation Age of Onset  . Adopted: Yes  . Migraines Daughter   . Cancer Son   . Colon cancer Neg Hx        Objective:    BP (!) 170/70   Pulse (!) 55   Temp 98.6 F (37 C) (Oral)   Resp 16   Ht 5' 7.5" (1.715 m)   Wt 123 lb (55.8 kg)   SpO2 92%   BMI 18.98 kg/m  Physical Exam  Constitutional: She is oriented to person, place, and time. She appears well-developed and well-nourished. No distress.  HENT:  Head: Normocephalic and atraumatic.  Right Ear: External ear normal.  Left Ear: External ear normal.  Nose: Nose  normal.  Mouth/Throat: Oropharynx is clear and moist.  Eyes: Conjunctivae and EOM are normal. Pupils are equal, round, and reactive to light.  Neck: Normal range of motion. Neck supple. Carotid bruit is not present. No thyromegaly present.  Cardiovascular: Normal rate, regular rhythm, normal heart sounds and intact distal pulses.  Exam reveals no gallop and no friction rub.   No murmur heard. Pulmonary/Chest: Effort normal and breath sounds normal. She has no wheezes. She has no rales.  Abdominal: Soft. Bowel sounds are normal. She exhibits no distension and no mass. There is no tenderness. There is no rebound and no guarding.  Musculoskeletal:  RLE: 1.5 cm diameter round nodule posterior medial proximal calf; no calf edema; Hommen's negative; no warmth or erythema.  Lymphadenopathy:  She has no cervical adenopathy.  Neurological: She is alert and oriented to person, place, and time. No cranial nerve deficit. She exhibits normal muscle tone. Coordination normal.  Skin: Skin is warm and dry. No rash noted. She is not diaphoretic. No erythema. No pallor.  Psychiatric: She has a normal mood and affect. Her behavior is normal.    Depression screen [facility] 2/9 04/18/2017 09/18/2016 08/19/2016 08/13/2016 08/06/2016  Decreased Interest 0 0 0 0 0  Down, Depressed, Hopeless 0 0 0 0 0  PHQ - 2 Score 0 0 0 0 0   Fall Risk  04/18/2017 09/18/2016 08/13/2016 08/06/2016 07/29/2016  Falls in the past year? No No Yes Yes Yes  Number falls in past yr: - - 1 2 or more 2 or more  Injury with Fall? - - Yes Yes -       Assessment & Plan:   1. HYPERTENSION, BENIGN SYSTEMIC   2. Seasonal allergic rhinitis due to pollen   3. Moderate persistent asthma without complication   4. Type 2 diabetes mellitus without complication, without long-term current use of insulin ([place])   5. Late onset Alzheimer's disease with behavioral disturbance   6. Dyslipidemia   7. Overflow incontinence of urine   8. Right leg swelling     -hypertension uncontrolled today; will change Metoprolol to tartrate 25mg  bid as patient is crushing medication. -obtain labs for chronic disease management; refills provided. -non-compliant with Flonase; recommend taking daily for rhinorrhea. -DMII controlled. Obtain labs; refills provided. -Alzheimer's disease progressing; no driving; tolerating Aricept; add Citalopram for underlying depression yet may warrant Risperdal for agitation. -new onset urinary incontinence; obtain urine culture. -new onset RLE nodule consistent with enlarged lymph node; obtain LE doppler for evaluation; not consistent with DVT despite history of one in past in LEFT leg. -discontinue Fenofibrate as minimal indication at this time. -prolonged face-to-face for 40 minutes with greater than 50% of time dedicated to counseling and coordination of care.  Orders Placed This Encounter  Procedures  . Urine Culture  . CBC with Differential/Platelet  . Comprehensive metabolic panel    Order Specific Question:   Has the patient fasted?    Answer:   Yes  . Hemoglobin A1c  . Lipid panel    Order Specific Question:   Has the patient fasted?    Answer:   Yes  . TSH  . Microalbumin, urine  . POCT urinalysis dipstick   Meds ordered this encounter  Medications  . metoprolol tartrate (LOPRESSOR) 25 MG tablet    Sig: Take 1 tablet (25 mg total) by mouth 2 (two) times daily.    Dispense:  180 tablet    Refill:  3  . citalopram (CELEXA) 10 MG tablet    Sig: Take 1 tablet (10 mg total) by mouth daily.    Dispense:  90 tablet    Refill:  1    Return in about 3 months (around 07/19/2017) for recheck blood sugar,hgh blood pressure.   [name] Elayne Guerin, M.D. Primary Care at [facility] previously [facility] [place] [address] [place], [place]  [address] [phone] phone [phone] fax

## 2017-04-18 NOTE — Patient Instructions (Addendum)
Pt advised in person to go to [facility] for right lower extremity venous doppler at 9am.    STOP FENOFIBRATE. STOP METOPROLOL ER AND START METOPROLOL 25MG  TWICE DAILY.   IF you received an x-ray today, you will receive an invoice from [facility]. Please contact [facility] at [phone] with questions or concerns regarding your invoice.   IF you received labwork today, you will receive an invoice from [facility]. Please contact [facility] at [phone] with questions or concerns regarding your invoice.   Our billing staff will not be able to assist you with questions regarding bills from these companies.  You will be contacted with the lab results as soon as they are available. The fastest way to get your results is to activate your My Chart account. Instructions are located on the last page of this paperwork. If you have not heard from [place] regarding the results in 2 weeks, please contact this office.

## 2017-04-19 LAB — TSH: TSH: 5.04 u[IU]/mL — AB (ref 0.450–4.500)

## 2017-04-19 LAB — MICROALBUMIN, URINE: Microalbumin, Urine: 50.5 ug/mL

## 2017-04-19 LAB — COMPREHENSIVE METABOLIC PANEL
A/G RATIO: 1.4 (ref 1.2–2.2)
ALBUMIN: 4 g/dL (ref 3.5–4.7)
ALT: 11 IU/L (ref 0–32)
AST: 24 IU/L (ref 0–40)
Alkaline Phosphatase: 50 IU/L (ref 39–117)
BUN/Creatinine Ratio: 24 (ref 12–28)
BUN: 25 mg/dL (ref 8–27)
Bilirubin Total: 0.3 mg/dL (ref 0.0–1.2)
CALCIUM: 10.4 mg/dL — AB (ref 8.7–10.3)
CO2: 28 mmol/L (ref 20–29)
CREATININE: 1.04 mg/dL — AB (ref 0.57–1.00)
Chloride: 100 mmol/L (ref 96–106)
GFR, EST AFRICAN AMERICAN: 57 mL/min/{1.73_m2} — AB (ref >59)
GFR, EST NON AFRICAN AMERICAN: 49 mL/min/{1.73_m2} — AB (ref >59)
GLOBULIN, TOTAL: 2.9 g/dL (ref 1.5–4.5)
Glucose: 87 mg/dL (ref 65–99)
Potassium: 5.1 mmol/L (ref 3.5–5.2)
SODIUM: 141 mmol/L (ref 134–144)
TOTAL PROTEIN: 6.9 g/dL (ref 6.0–8.5)

## 2017-04-19 LAB — LIPID PANEL
Chol/HDL Ratio: 2.9 ratio (ref 0.0–4.4)
Cholesterol, Total: 180 mg/dL (ref 100–199)
HDL: 62 mg/dL (ref >39)
LDL CALC: 104 mg/dL — AB (ref 0–99)
Triglycerides: 69 mg/dL (ref 0–149)
VLDL CHOLESTEROL CAL: 14 mg/dL (ref 5–40)

## 2017-04-19 LAB — CBC WITH DIFFERENTIAL/PLATELET
BASOS: 1 %
Basophils Absolute: 0.1 10*3/uL (ref 0.0–0.2)
EOS (ABSOLUTE): 0.6 10*3/uL — ABNORMAL HIGH (ref 0.0–0.4)
EOS: 8 %
HEMATOCRIT: 41.7 % (ref 34.0–46.6)
HEMOGLOBIN: 13.6 g/dL (ref 11.1–15.9)
IMMATURE GRANS (ABS): 0 10*3/uL (ref 0.0–0.1)
IMMATURE GRANULOCYTES: 0 %
Lymphocytes Absolute: 1.9 10*3/uL (ref 0.7–3.1)
Lymphs: 27 %
MCH: 31.4 pg (ref 26.6–33.0)
MCHC: 32.6 g/dL (ref 31.5–35.7)
MCV: 96 fL (ref 79–97)
MONOCYTES: 9 %
Monocytes Absolute: 0.7 10*3/uL (ref 0.1–0.9)
NEUTROS PCT: 55 %
Neutrophils Absolute: 4 10*3/uL (ref 1.4–7.0)
Platelets: 293 10*3/uL (ref 150–379)
RBC: 4.33 x10E6/uL (ref 3.77–5.28)
RDW: 13.8 % (ref 12.3–15.4)
WBC: 7.3 10*3/uL (ref 3.4–10.8)

## 2017-04-19 LAB — HEMOGLOBIN A1C
ESTIMATED AVERAGE GLUCOSE: 140 mg/dL
Hgb A1c MFr Bld: 6.5 % — ABNORMAL HIGH (ref 4.8–5.6)

## 2017-04-19 LAB — URINE CULTURE: Organism ID, Bacteria: NO GROWTH

## 2017-04-20 ENCOUNTER — Encounter: Payer: Self-pay | Admitting: Family Medicine

## 2017-04-21 ENCOUNTER — Ambulatory Visit: Payer: Self-pay

## 2017-04-21 ENCOUNTER — Other Ambulatory Visit: Payer: Self-pay | Admitting: Family Medicine

## 2017-04-21 ENCOUNTER — Ambulatory Visit (HOSPITAL_COMMUNITY)
Admission: RE | Admit: 2017-04-21 | Discharge: 2017-04-21 | Disposition: A | Discharge status: Home / Self Care | Payer: Medicare Other | Source: Ambulatory Visit | Attending: Family Medicine | Admitting: Family Medicine

## 2017-04-21 DIAGNOSIS — I82431 Acute embolism and thrombosis of right popliteal vein: Secondary | ICD-10-CM | POA: Insufficient documentation

## 2017-04-21 DIAGNOSIS — M79604 Pain in right leg: Secondary | ICD-10-CM | POA: Diagnosis not present

## 2017-04-21 DIAGNOSIS — M7989 Other specified soft tissue disorders: Secondary | ICD-10-CM | POA: Diagnosis not present

## 2017-04-21 DIAGNOSIS — M7121 Synovial cyst of popliteal space [Baker], right knee: Secondary | ICD-10-CM | POA: Diagnosis not present

## 2017-04-21 DIAGNOSIS — M79605 Pain in left leg: Secondary | ICD-10-CM

## 2017-04-21 NOTE — Progress Notes (Signed)
Letter sent.

## 2017-04-21 NOTE — Progress Notes (Signed)
**  Preliminary report by tech**  Right lower extremity venous duplex complete. There is evidence of age indeterminate deep vein thrombosis involving the popliteal vein of the right lower extremity. There appears to be a duplicate popliteal vein which is patent and compressible.  There is no evidence of superficial vein thrombosis involving the right lower extremity. Incidental findings are consistent with a Baker's Cyst measuring 1.2 cm high by 1.6 cm wide by 2.4 cm long on the right. Results were given to [facility] at Dr. Thompson Caul office.  04/21/17 9:45 AM [name] RVT

## 2017-04-22 ENCOUNTER — Other Ambulatory Visit: Payer: Self-pay | Admitting: Family Medicine

## 2017-04-22 ENCOUNTER — Telehealth: Payer: Self-pay | Admitting: Family Medicine

## 2017-04-22 MED ORDER — RIVAROXABAN (XARELTO) VTE STARTER PACK (15 & 20 MG): ORAL_TABLET | ORAL | 0 refills | Status: DC

## 2017-04-22 NOTE — Telephone Encounter (Signed)
Please advise 

## 2017-04-22 NOTE — Telephone Encounter (Signed)
Pharmacy is calling to see if we could e-scribe a rx on the new shingles vaccine pharmacy thinks there was a misunderstanding of the request earlier that was denied   Best number [phone] [facility]

## 2017-04-28 MED ORDER — ZOSTER VAC RECOMB ADJUVANTED 50 MCG/0.5ML IM SUSR: 0.5000 mL | Freq: Once | INTRAMUSCULAR | 1 refills | Status: AC

## 2017-05-16 ENCOUNTER — Other Ambulatory Visit: Payer: Self-pay | Admitting: Family Medicine

## 2017-05-16 NOTE — Telephone Encounter (Signed)
Per Dr. Thompson Caul note-pt to be on Xarelto x 3 months. Given starter dose.  Refill requested.  To Dr. Tamala Julian for clarification

## 2017-05-17 MED ORDER — RIVAROXABAN 20 MG PO TABS: 20.0000 mg | ORAL_TABLET | Freq: Every day | ORAL | 0 refills | Status: DC

## 2017-05-21 ENCOUNTER — Other Ambulatory Visit: Payer: Self-pay | Admitting: Family Medicine

## 2017-05-21 DIAGNOSIS — F0280 Dementia in other diseases classified elsewhere without behavioral disturbance: Secondary | ICD-10-CM

## 2017-05-21 DIAGNOSIS — G301 Alzheimer's disease with late onset: Principal | ICD-10-CM

## 2017-06-01 ENCOUNTER — Other Ambulatory Visit: Payer: Self-pay | Admitting: Family Medicine

## 2017-06-16 NOTE — Progress Notes (Signed)
Subjective:    Patient ID: [name], female    DOB: [birth year], 83 y.o.   MRN: [id number]  06/17/2017  Shortness of Breath (x 2 weeks ) and Leg Swelling (right leg follow-up)   HPI This 83 y.o. female presents with daughter and husband for two month follow-up of DVT new onset RIGHT.  Started on Xarelto at last visit with diagnosis of DVT.  Very sedentary at home.  Husband works all day.   SOB/DOE: worsening since last visit; has used one Proair in past two weeks. Ambulating from chair to bathroom, has DOE; much worse per family.  Known pleural effusion and patient has refused work up in the past year; also refused pulmonology consultation in the past year.  Not using QVAR; has it at home. Refuses QVAR.  Husband does not feel that patient can use HFA appropriately. S/p cardiology evaluation in past year for DOE; work up negative at that time.  Patient chronically DOE; physical activity has significantly declined in the past year.  Patient using Proair excessively for DOE yet no improvement; rare wheezing.  Allergic rhinitis: refuses to use Flonase.  Suffers with dripping nose constantly.  Had vascular surgery in 09/2004; then developed DVT in leg six months afterwards.  Alzheimer's dementia: Memory is not good.  Family reports that memory continues to decline.  Husband presents with a letter of concerns. Last office visit with Dr. Tomi Likens in 01/04/2016.  Donepezil 5mg  started at that time. MRI brain obtained in 10/2015 that revealed moderate chronic small vessel disease and atrophy within medial temporal lobes and hippocampi.  Montreal Congintive Assessment 12/2015 of 15.  Advised NOT to drive at that visit; recommended formal OT driving evaluation to assess safety.  Dr. Tomi Likens expressed concerns regarding husband's memory at that visit.  BP Readings from Last 3 Encounters:  06/17/17 124/65  04/18/17 (!) 170/70  11/29/16 (!) 154/76   Wt Readings from Last 3 Encounters:  04/18/17 123 lb  (55.8 kg)  11/29/16 115 lb 3.2 oz (52.3 kg)  10/25/16 117 lb (53.1 kg)   Immunization History  Administered Date(s) Administered  . Influenza Split 08/21/2012, 08/22/2015  . Influenza,inj,Quad PF,6+ Mos 08/19/2016, 06/17/2017  . Influenza-Unspecified 08/21/2013  . Pneumococcal Conjugate-13 10/06/2014  . Pneumococcal Polysaccharide-23 08/21/2005  . Td 02/19/2004  . Zoster 06/21/2012    Review of Systems  Constitutional: Negative for chills, diaphoresis, fatigue and fever.  Eyes: Negative for visual disturbance.  Respiratory: Positive for shortness of breath. Negative for cough and wheezing.   Cardiovascular: Negative for chest pain, palpitations and leg swelling.  Gastrointestinal: Negative for abdominal pain, constipation, diarrhea, nausea and vomiting.  Endocrine: Negative for cold intolerance, heat intolerance, polydipsia, polyphagia and polyuria.  Neurological: Negative for dizziness, tremors, seizures, syncope, facial asymmetry, speech difficulty, weakness, light-headedness, numbness and headaches.  Psychiatric/Behavioral: Positive for confusion and decreased concentration. Negative for dysphoric mood, self-injury, sleep disturbance and suicidal ideas. The patient is not nervous/anxious.     Past Medical History:  Diagnosis Date  . Alzheimer's dementia without behavioral disturbance    diagnosed 12/2015; Dr. Jade/neurology  . Anemia   . Asthma   . Blood transfusion 05-24-12  . Blood transfusion without reported diagnosis   . Chronic kidney disease    dr Clover Mealy  . Diabetes mellitus   . Diverticulosis   . DVT (deep venous thrombosis) ([place]) 03/2005   hx.left leg  . Esophageal stricture   . GERD (gastroesophageal reflux disease)   . Glaucoma   . Hiatal hernia 05/07/13  .  Hyperlipidemia   . Hypertension   . Osteoarthritis   . Pancreatic duct dilated 05/07/13  . Pulmonary nodules 05/07/13  . PVD (peripheral vascular disease) ([place])   . Tubular adenoma of colon    Past  Surgical History:  Procedure Laterality Date  . CATARACT EXTRACTION, BILATERAL    . COLONOSCOPY  05/27/2012   Procedure: COLONOSCOPY;  Surgeon: Lafayette Dragon, MD;  Location: [facility];  Service: Endoscopy;  Laterality: N/A;  . ESOPHAGOGASTRODUODENOSCOPY  05/26/2012   Procedure: ESOPHAGOGASTRODUODENOSCOPY (EGD);  Surgeon: Lafayette Dragon, MD;  Location: [facility];  Service: Endoscopy;  Laterality: N/A;  . EUS N/A 07/08/2013   Procedure: UPPER ENDOSCOPIC ULTRASOUND (EUS) LINEAR;  Surgeon: Milus Banister, MD;  Location: [facility];  Service: Endoscopy;  Laterality: N/A;  need side view scope  . EYE SURGERY    . VASCULAR SURGERY  09/2004  . WRIST FRACTURE SURGERY     R wrist fracture, plate   No Known Allergies  Social History   Social History  . Marital status: Married    Spouse name: N/A  . Number of children: N/A  . Years of education: N/A   Occupational History  . Not on file.   Social History Main Topics  . Smoking status: Never Smoker  . Smokeless tobacco: Never Used  . Alcohol use No  . Drug use: No  . Sexual activity: Not on file   Other Topics Concern  . Not on file   Social History Narrative   Marital status: married x 60 years - Husband is Hadeel Hillebrand, Brooke Bonito (former Teacher, English as a foreign language of [employer]) -- they have been together since she was in 6th grade!!      Children:  4 children; 11 grandchildren; 2 gg      Lives: with husband, dog      Employment: Retired      Tobacco: never      Alcohol: none      Exercise:  Walking daily short distances; previous Firefighter      ADLs:  Walks with cane; cleans and cooks; drives sparingly   Born: [place], [place]   Has 2 years of college.    Family History  Problem Relation Age of Onset  . Adopted: Yes  . Migraines Daughter   . Cancer Son   . Colon cancer Neg Hx        Objective:    BP 124/65   Pulse (!) 53   Temp 98 F (36.7 C) (Oral)   SpO2 93%  Physical Exam  Constitutional: She is oriented to person, place, and  time. She appears well-developed and well-nourished. No distress.  HENT:  Head: Normocephalic and atraumatic.  Right Ear: External ear normal.  Left Ear: External ear normal.  Nose: Nose normal.  Mouth/Throat: Oropharynx is clear and moist.  Eyes: Pupils are equal, round, and reactive to light. Conjunctivae and EOM are normal.  Neck: Normal range of motion. Neck supple. Carotid bruit is not present. No thyromegaly present.  Cardiovascular: Normal rate, regular rhythm, normal heart sounds and intact distal pulses.  Exam reveals no gallop and no friction rub.   No murmur heard. Pulmonary/Chest: Effort normal. No accessory muscle usage. No tachypnea. No respiratory distress. She has no wheezes. She has no rhonchi. She has rales in the right lower field and the left lower field.  Abdominal: Soft. Bowel sounds are normal. She exhibits no distension and no mass. There is no tenderness. There is no rebound and no guarding.  Lymphadenopathy:    She has no cervical adenopathy.  Neurological: She is alert and oriented to person, place, and time. No cranial nerve deficit.  Skin: Skin is warm and dry. No rash noted. She is not diaphoretic. No erythema. No pallor.  Psychiatric: She has a normal mood and affect. Her behavior is normal.    No results found. Depression screen [facility] 2/9 06/17/2017 04/18/2017 09/18/2016 08/19/2016 08/13/2016  Decreased Interest 0 0 0 0 0  Down, Depressed, Hopeless 0 0 0 0 0  PHQ - 2 Score 0 0 0 0 0   Fall Risk  06/17/2017 04/18/2017 09/18/2016 08/13/2016 08/06/2016  Falls in the past year? Yes No No Yes Yes  Number falls in past yr: 1 - - 1 2 or more  Comment - - - - fall x 3 got hurt only once  Injury with Fall? - - - Yes Yes  Comment - - - Cracked rib, hurt knee -   MMSE - Mini Mental State Exam 04/18/2017  Orientation to time 5  Orientation to Place 5  Registration 1  Attention/ Calculation 0  Recall 1  Language- name 2 objects 2  Language- repeat 1  Language-  follow 3 step command 3  Language- read & follow direction 1  Write a sentence 1  Copy design 1  Total score 21         Assessment & Plan:   1. Acute deep vein thrombosis (DVT) of right popliteal vein (HCC)   2. Dyspnea on exertion   3. Pleural effusion   4. Late onset Alzheimer's disease with behavioral disturbance   5. Moderate persistent asthma without complication   6. Type 2 diabetes mellitus without complication, without long-term current use of insulin ([facility])   7. Abnormal thyroid blood test   8. Need for immunization against influenza    -new onset DVT RIGHT; initiated on Xarelto after last visit in 03/2017; recommend continuing Xarelto for 3-6 months.  High risk for falls due to Alzheimer's dementia and deconditioning.  Recurrent DVT yet age of current DVT uncertain. -family reporting worsening DOE: obtain CXR to evaluate pleural effusions.  Pt has refused further work up for pleural effusions and agreeable yet may consider thoracentesis if has worsened for symptomatic relief only considering Alzheimers.   Also obtain labs to rule out secondary causes.  S/p cardiology consultation in past year for DOE chronic. -no wheezing on exam to suggest asthma exacerbation; I am sure that patient cannot use HFA adequately; will send in nebulizer. -Alzheimer's is gradually worsening; s/p neurology consultation in 2017 yet no follow-up; continue Aricept and consider adding Namenda at next visit.   Continue Celexa 10mg  daily for any underlying depression or anxiety; no improvement at this point.  -patient non-compliant with Flonase; recommend Claritin 10mg  daily. -never took Synthroid regularly; TSH always borderline; repeat today; will treat if TSH > 8-10.  -prolonged face-to-face for 40 minutes with greater than 50% of time dedicated to counseling and coordination of care.   Orders Placed This Encounter  Procedures  . DME Nebulizer machine    Order Specific Question:   Patient needs a  nebulizer to treat with the following condition    Answer:   Asthma [270350]  . DG Chest 2 View    Standing Status:   Future    Number of Occurrences:   1    Standing Expiration Date:   06/17/2018    Order Specific Question:   Reason for Exam (SYMPTOM  OR DIAGNOSIS  REQUIRED)    Answer:   DOE, SOB, pleural effusions    Order Specific Question:   Preferred imaging location?    Answer:   External  . Flu Vaccine QUAD 36+ mos IM  . CBC with Differential/Platelet  . Comprehensive metabolic panel  . TSH  . T4, free  . Ambulatory referral to Respiratory Therapy    Referral Priority:   Routine    Referral Type:   Consultation    Referral Reason:   Specialty Services Required    Requested Specialty:   Respiratory Therapy    Number of Visits Requested:   1  . EKG 12-Lead   Meds ordered this encounter  Medications  . DISCONTD: levothyroxine (SYNTHROID, LEVOTHROID) 25 MCG tablet    Sig: Take 25 mcg by mouth daily before breakfast.  . metFORMIN (GLUCOPHAGE) 500 MG tablet    Sig: TAKE 1 TABLET ONCE DAILY WITH SUPPER.    Dispense:  90 tablet    Refill:  3  . albuterol (PROVENTIL) (2.5 MG/3ML) 0.083% nebulizer solution    Sig: Take 3 mLs (2.5 mg total) by nebulization every 6 (six) hours as needed for wheezing or shortness of breath.    Dispense:  75 mL    Refill:  0  . budesonide (PULMICORT) 0.25 MG/2ML nebulizer solution    Sig: Take 2 mLs (0.25 mg total) by nebulization daily.    Dispense:  60 mL    Refill:  12    Return in about 6 weeks (around 07/29/2017) for recheck weakness, dyspnea.   Rodgers Likes Elayne Guerin, M.D. Primary Care at [facility] previously Ur[facility] [place] [address] [place], [place]  [address] [phone] phone [phone] fax

## 2017-06-17 ENCOUNTER — Ambulatory Visit (INDEPENDENT_AMBULATORY_CARE_PROVIDER_SITE_OTHER): Payer: Medicare Other | Admitting: Family Medicine

## 2017-06-17 ENCOUNTER — Ambulatory Visit (INDEPENDENT_AMBULATORY_CARE_PROVIDER_SITE_OTHER): Payer: Medicare Other

## 2017-06-17 ENCOUNTER — Encounter: Payer: Self-pay | Admitting: Family Medicine

## 2017-06-17 VITALS — BP 124/65 | HR 53 | Temp 98.0°F

## 2017-06-17 DIAGNOSIS — E119 Type 2 diabetes mellitus without complications: Secondary | ICD-10-CM

## 2017-06-17 DIAGNOSIS — I2510 Atherosclerotic heart disease of native coronary artery without angina pectoris: Secondary | ICD-10-CM

## 2017-06-17 DIAGNOSIS — J90 Pleural effusion, not elsewhere classified: Secondary | ICD-10-CM

## 2017-06-17 DIAGNOSIS — G301 Alzheimer's disease with late onset: Secondary | ICD-10-CM

## 2017-06-17 DIAGNOSIS — R7989 Other specified abnormal findings of blood chemistry: Secondary | ICD-10-CM

## 2017-06-17 DIAGNOSIS — R946 Abnormal results of thyroid function studies: Secondary | ICD-10-CM

## 2017-06-17 DIAGNOSIS — R0609 Other forms of dyspnea: Secondary | ICD-10-CM | POA: Diagnosis not present

## 2017-06-17 DIAGNOSIS — I82431 Acute embolism and thrombosis of right popliteal vein: Secondary | ICD-10-CM | POA: Diagnosis not present

## 2017-06-17 DIAGNOSIS — Z23 Encounter for immunization: Secondary | ICD-10-CM | POA: Diagnosis not present

## 2017-06-17 DIAGNOSIS — J4540 Moderate persistent asthma, uncomplicated: Secondary | ICD-10-CM

## 2017-06-17 DIAGNOSIS — F0281 Dementia in other diseases classified elsewhere with behavioral disturbance: Secondary | ICD-10-CM

## 2017-06-17 DIAGNOSIS — R0602 Shortness of breath: Secondary | ICD-10-CM | POA: Diagnosis not present

## 2017-06-17 DIAGNOSIS — F02818 Dementia in other diseases classified elsewhere, unspecified severity, with other behavioral disturbance: Secondary | ICD-10-CM

## 2017-06-17 NOTE — Patient Instructions (Addendum)
   claritin 10mg  one tablet daily for runny nose.  IF you received an x-ray today, you will receive an invoice from [facility]. Please contact [facility] at [phone] with questions or concerns regarding your invoice.   IF you received labwork today, you will receive an invoice from [facility]. Please contact [facility] at [phone] with questions or concerns regarding your invoice.   Our billing staff will not be able to assist you with questions regarding bills from these companies.  You will be contacted with the lab results as soon as they are available. The fastest way to get your results is to activate your My Chart account. Instructions are located on the last page of this paperwork. If you have not heard from [place] regarding the results in 2 weeks, please contact this office.

## 2017-06-18 LAB — COMPREHENSIVE METABOLIC PANEL
A/G RATIO: 1.4 (ref 1.2–2.2)
ALK PHOS: 63 IU/L (ref 39–117)
ALT: 17 IU/L (ref 0–32)
AST: 24 IU/L (ref 0–40)
Albumin: 4.2 g/dL (ref 3.5–4.7)
BILIRUBIN TOTAL: 0.3 mg/dL (ref 0.0–1.2)
BUN/Creatinine Ratio: 24 (ref 12–28)
BUN: 21 mg/dL (ref 8–27)
CHLORIDE: 97 mmol/L (ref 96–106)
CO2: 28 mmol/L (ref 20–29)
Calcium: 10.5 mg/dL — ABNORMAL HIGH (ref 8.7–10.3)
Creatinine, Ser: 0.86 mg/dL (ref 0.57–1.00)
GFR calc non Af Amer: 62 mL/min/{1.73_m2} (ref >59)
GFR, EST AFRICAN AMERICAN: 72 mL/min/{1.73_m2} (ref >59)
GLUCOSE: 104 mg/dL — AB (ref 65–99)
Globulin, Total: 2.9 g/dL (ref 1.5–4.5)
POTASSIUM: 4.5 mmol/L (ref 3.5–5.2)
Sodium: 140 mmol/L (ref 134–144)
TOTAL PROTEIN: 7.1 g/dL (ref 6.0–8.5)

## 2017-06-18 LAB — CBC WITH DIFFERENTIAL/PLATELET
BASOS ABS: 0 10*3/uL (ref 0.0–0.2)
Basos: 1 %
EOS (ABSOLUTE): 1.3 10*3/uL — AB (ref 0.0–0.4)
Eos: 15 %
Hematocrit: 41.9 % (ref 34.0–46.6)
Hemoglobin: 13.9 g/dL (ref 11.1–15.9)
IMMATURE GRANS (ABS): 0 10*3/uL (ref 0.0–0.1)
Immature Granulocytes: 0 %
LYMPHS: 22 %
Lymphocytes Absolute: 1.9 10*3/uL (ref 0.7–3.1)
MCH: 32.5 pg (ref 26.6–33.0)
MCHC: 33.2 g/dL (ref 31.5–35.7)
MCV: 98 fL — ABNORMAL HIGH (ref 79–97)
MONOS ABS: 0.8 10*3/uL (ref 0.1–0.9)
Monocytes: 9 %
NEUTROS ABS: 4.4 10*3/uL (ref 1.4–7.0)
NEUTROS PCT: 53 %
PLATELETS: 297 10*3/uL (ref 150–379)
RBC: 4.28 x10E6/uL (ref 3.77–5.28)
RDW: 12.9 % (ref 12.3–15.4)
WBC: 8.3 10*3/uL (ref 3.4–10.8)

## 2017-06-18 LAB — TSH: TSH: 4.67 u[IU]/mL — AB (ref 0.450–4.500)

## 2017-06-18 LAB — T4, FREE: FREE T4: 0.93 ng/dL (ref 0.82–1.77)

## 2017-06-25 ENCOUNTER — Telehealth: Payer: Self-pay | Admitting: Radiology

## 2017-06-25 DIAGNOSIS — R29898 Other symptoms and signs involving the musculoskeletal system: Secondary | ICD-10-CM

## 2017-06-25 DIAGNOSIS — J4540 Moderate persistent asthma, uncomplicated: Secondary | ICD-10-CM

## 2017-06-25 DIAGNOSIS — J90 Pleural effusion, not elsewhere classified: Secondary | ICD-10-CM

## 2017-06-25 DIAGNOSIS — F0281 Dementia in other diseases classified elsewhere with behavioral disturbance: Secondary | ICD-10-CM

## 2017-06-25 DIAGNOSIS — G301 Alzheimer's disease with late onset: Secondary | ICD-10-CM

## 2017-06-25 DIAGNOSIS — F02818 Dementia in other diseases classified elsewhere, unspecified severity, with other behavioral disturbance: Secondary | ICD-10-CM

## 2017-06-25 NOTE — Telephone Encounter (Signed)
The patient's husband called, informed of results, and he want to know what it can be done  for pleural effusion?

## 2017-06-26 DIAGNOSIS — Z86718 Personal history of other venous thrombosis and embolism: Secondary | ICD-10-CM | POA: Insufficient documentation

## 2017-06-26 MED ORDER — METFORMIN HCL 500 MG PO TABS: ORAL_TABLET | ORAL | 3 refills | Status: DC

## 2017-06-26 MED ORDER — ALBUTEROL SULFATE (2.5 MG/3ML) 0.083% IN NEBU: 2.5000 mg | INHALATION_SOLUTION | Freq: Four times a day (QID) | RESPIRATORY_TRACT | 0 refills | Status: DC | PRN

## 2017-06-26 MED ORDER — BUDESONIDE 0.25 MG/2ML IN SUSP: 0.2500 mg | Freq: Every day | RESPIRATORY_TRACT | 12 refills | Status: DC

## 2017-06-27 MED ORDER — ALBUTEROL SULFATE (2.5 MG/3ML) 0.083% IN NEBU: 2.5000 mg | INHALATION_SOLUTION | Freq: Four times a day (QID) | RESPIRATORY_TRACT | 0 refills | Status: DC | PRN

## 2017-06-27 MED ORDER — BUDESONIDE 0.25 MG/2ML IN SUSP: 0.2500 mg | Freq: Every day | RESPIRATORY_TRACT | 12 refills | Status: DC

## 2017-06-27 NOTE — Telephone Encounter (Signed)
Spoke with husband --- small pleural effusion persistent yet no larger.  Recommend repeating CT scan chest due to worsening DOE (though feel secondary to deconditioning). Also sent in nebulizer solution for Albuterol and Pulmicort due to difficulty with using HFAs.  Husband requesting home physical therapy for deconditioning.  Rx for nebulizer solution sent to [facility] because [facility] does not stock.  Please call [facility] or [facility] to deliver nebulizer.

## 2017-07-08 ENCOUNTER — Other Ambulatory Visit: Payer: Self-pay

## 2017-07-08 DIAGNOSIS — J4540 Moderate persistent asthma, uncomplicated: Secondary | ICD-10-CM

## 2017-07-08 MED ORDER — BUDESONIDE 0.25 MG/2ML IN SUSP: 0.2500 mg | Freq: Every day | RESPIRATORY_TRACT | 12 refills | Status: DC

## 2017-07-08 MED ORDER — ALBUTEROL SULFATE (2.5 MG/3ML) 0.083% IN NEBU: 2.5000 mg | INHALATION_SOLUTION | Freq: Four times a day (QID) | RESPIRATORY_TRACT | 0 refills | Status: DC | PRN

## 2017-07-09 ENCOUNTER — Other Ambulatory Visit: Payer: Self-pay

## 2017-07-09 DIAGNOSIS — J4540 Moderate persistent asthma, uncomplicated: Secondary | ICD-10-CM

## 2017-07-09 MED ORDER — BUDESONIDE 0.25 MG/2ML IN SUSP: 0.2500 mg | Freq: Every day | RESPIRATORY_TRACT | 12 refills | Status: DC

## 2017-07-10 NOTE — Telephone Encounter (Signed)
Information has been sent to [facility] for nebulizer to be delivered to her home.  Will call today to verify if they need anymore information.

## 2017-07-15 DIAGNOSIS — J90 Pleural effusion, not elsewhere classified: Secondary | ICD-10-CM | POA: Diagnosis not present

## 2017-07-15 DIAGNOSIS — I82401 Acute embolism and thrombosis of unspecified deep veins of right lower extremity: Secondary | ICD-10-CM | POA: Diagnosis not present

## 2017-07-15 DIAGNOSIS — R29898 Other symptoms and signs involving the musculoskeletal system: Secondary | ICD-10-CM | POA: Diagnosis not present

## 2017-07-15 DIAGNOSIS — E119 Type 2 diabetes mellitus without complications: Secondary | ICD-10-CM | POA: Diagnosis not present

## 2017-07-15 DIAGNOSIS — G301 Alzheimer's disease with late onset: Secondary | ICD-10-CM | POA: Diagnosis not present

## 2017-07-15 DIAGNOSIS — F0280 Dementia in other diseases classified elsewhere without behavioral disturbance: Secondary | ICD-10-CM | POA: Diagnosis not present

## 2017-07-16 ENCOUNTER — Telehealth: Payer: Self-pay | Admitting: Family Medicine

## 2017-07-16 NOTE — Telephone Encounter (Signed)
Bessie from [facility] called stated that she needs verbal orders for pt to continue her physical therapy 2 x a week for 4 weeks and then 1 x a week for 1 week.Marnette Burgess also needs an order for a nurse to come by and assist pt as well as an occupational therapist and a speech therapist.. Any questions contact Bessie  At: [phone]

## 2017-07-18 NOTE — Telephone Encounter (Signed)
Please call Bessie and approve all verbal orders as requested.

## 2017-07-21 DIAGNOSIS — I82401 Acute embolism and thrombosis of unspecified deep veins of right lower extremity: Secondary | ICD-10-CM | POA: Diagnosis not present

## 2017-07-21 DIAGNOSIS — J90 Pleural effusion, not elsewhere classified: Secondary | ICD-10-CM | POA: Diagnosis not present

## 2017-07-21 DIAGNOSIS — E119 Type 2 diabetes mellitus without complications: Secondary | ICD-10-CM | POA: Diagnosis not present

## 2017-07-21 DIAGNOSIS — F0280 Dementia in other diseases classified elsewhere without behavioral disturbance: Secondary | ICD-10-CM | POA: Diagnosis not present

## 2017-07-21 DIAGNOSIS — G301 Alzheimer's disease with late onset: Secondary | ICD-10-CM | POA: Diagnosis not present

## 2017-07-21 DIAGNOSIS — R29898 Other symptoms and signs involving the musculoskeletal system: Secondary | ICD-10-CM | POA: Diagnosis not present

## 2017-07-22 ENCOUNTER — Other Ambulatory Visit: Payer: Self-pay | Admitting: Family Medicine

## 2017-07-22 DIAGNOSIS — G301 Alzheimer's disease with late onset: Principal | ICD-10-CM

## 2017-07-22 DIAGNOSIS — F0280 Dementia in other diseases classified elsewhere without behavioral disturbance: Secondary | ICD-10-CM

## 2017-07-22 NOTE — Progress Notes (Signed)
Subjective:    Patient ID: [name], female    DOB: [birth year], 83 y.o.   MRN: [id number]  07/23/2017  Hypertension (3 month follow-up ); Diabetes; and Asthma   HPI This 83 y.o. female presents for two month follow-up of Alzheimer's dementia, asthma, dyspnea on exertion, pleural effusions, and DVT.  Management changes at last visit in August 2018 included continuing Xarelto for six months, obtaining CXR to reevaluate pleural effusions, ordering nebulizer to replace HFAs due to dementia, adding Claritin 10mg  daily for rhinorrhea, repeat TSH.  -TSH 4.6 at last visit.  Not taking supplementation. -CXR showed stable pleural effusions without acute worsening.  Nebulizer with pulmicort twice daily.  Uses Pulmicort every morning and Albuterol at night.  Using HFA as needed; using HFA daily and several times per day but much less than before.  CXR stable; LEFT pleural effusion unchanged yet persistent.   Physical therapist/Greg came on Monday of this week; patient loved Marya Amsler.  Almost passed out on patient and Marya Amsler did nothing. Got really weak with Marya Amsler.  Got very concerned.    Not checking sugars; refusing to check sugar.   Husband requesting additional medication for memory.  Husband also does not think patient is taking Citalopram.  No persistent crying.  BP Readings from Last 3 Encounters:  07/23/17 126/72  06/17/17 124/65  04/18/17 (!) 170/70   Wt Readings from Last 3 Encounters:  07/23/17 124 lb (56.2 kg)  04/18/17 123 lb (55.8 kg)  11/29/16 115 lb 3.2 oz (52.3 kg)   Immunization History  Administered Date(s) Administered  . Influenza Split 08/21/2012, 08/22/2015  . Influenza,inj,Quad PF,6+ Mos 08/19/2016, 06/17/2017  . Influenza-Unspecified 08/21/2013  . Pneumococcal Conjugate-13 10/06/2014  . Pneumococcal Polysaccharide-23 08/21/2005, 07/23/2017  . Td 02/19/2004  . Zoster 06/21/2012    Review of Systems  Constitutional: Negative for chills, diaphoresis, fatigue and  fever.  HENT: Positive for congestion and rhinorrhea.   Eyes: Negative for visual disturbance.  Respiratory: Positive for shortness of breath. Negative for cough and wheezing.   Cardiovascular: Negative for chest pain, palpitations and leg swelling.  Gastrointestinal: Negative for abdominal pain, constipation, diarrhea, nausea and vomiting.  Endocrine: Negative for cold intolerance, heat intolerance, polydipsia, polyphagia and polyuria.  Neurological: Negative for dizziness, tremors, seizures, syncope, facial asymmetry, speech difficulty, weakness, light-headedness, numbness and headaches.  Psychiatric/Behavioral: Positive for confusion and decreased concentration. Negative for dysphoric mood, hallucinations, self-injury and sleep disturbance. The patient is not nervous/anxious and is not hyperactive.     Past Medical History:  Diagnosis Date  . Alzheimer's dementia without behavioral disturbance    diagnosed 12/2015; Dr. Jade/neurology  . Anemia   . Asthma   . Blood transfusion 05-24-12  . Blood transfusion without reported diagnosis   . Chronic kidney disease    dr Clover Mealy  . Diabetes mellitus   . Diverticulosis   . DVT (deep venous thrombosis) ([place]) 03/2005   hx.left leg  . Esophageal stricture   . GERD (gastroesophageal reflux disease)   . Glaucoma   . Hiatal hernia 05/07/13  . Hyperlipidemia   . Hypertension   . Osteoarthritis   . Pancreatic duct dilated 05/07/13  . Pulmonary nodules 05/07/13  . PVD (peripheral vascular disease) ([place])   . Tubular adenoma of colon    Past Surgical History:  Procedure Laterality Date  . CATARACT EXTRACTION, BILATERAL    . COLONOSCOPY  05/27/2012   Procedure: COLONOSCOPY;  Surgeon: Lafayette Dragon, MD;  Location: [facility];  Service: Endoscopy;  Laterality: N/A;  .  ESOPHAGOGASTRODUODENOSCOPY  05/26/2012   Procedure: ESOPHAGOGASTRODUODENOSCOPY (EGD);  Surgeon: Lafayette Dragon, MD;  Location: [facility];  Service: Endoscopy;  Laterality: N/A;  .  EUS N/A 07/08/2013   Procedure: UPPER ENDOSCOPIC ULTRASOUND (EUS) LINEAR;  Surgeon: Milus Banister, MD;  Location: [facility];  Service: Endoscopy;  Laterality: N/A;  need side view scope  . EYE SURGERY    . VASCULAR SURGERY  09/2004  . WRIST FRACTURE SURGERY     R wrist fracture, plate   No Known Allergies  Social History   Social History  . Marital status: Married    Spouse name: N/A  . Number of children: N/A  . Years of education: N/A   Occupational History  . Not on file.   Social History Main Topics  . Smoking status: Never Smoker  . Smokeless tobacco: Never Used  . Alcohol use No  . Drug use: No  . Sexual activity: Not on file   Other Topics Concern  . Not on file   Social History Narrative   Marital status: married x 60 years - Husband is Brunette Lavalle, Brooke Bonito (former Teacher, English as a foreign language [employer]) -- they have been together since she was in 6th grade!!      Children:  4 children; 11 grandchildren; 2 gg      Lives: with husband, dog      Employment: Retired      Tobacco: never      Alcohol: none      Exercise:  Walking daily short distances; previous Firefighter      ADLs:  Walks with cane; cleans and cooks; drives sparingly   Born: [place], [place]   Has 2 years of college.    Family History  Problem Relation Age of Onset  . Adopted: Yes  . Migraines Daughter   . Cancer Son   . Colon cancer Neg Hx        Objective:    BP 126/72   Pulse (!) 57   Temp 98 F (36.7 C) (Oral)   Resp 16   Ht 5' 5.75" (1.67 m)   Wt 124 lb (56.2 kg)   SpO2 98%   BMI 20.17 kg/m  Physical Exam  Constitutional: She is oriented to person, place, and time. She appears well-developed and well-nourished. No distress.  Hair poorly groomed.  HENT:  Head: Normocephalic and atraumatic.  Right Ear: External ear normal.  Left Ear: External ear normal.  Nose: Nose normal.  Mouth/Throat: Oropharynx is clear and moist.  Eyes: Pupils are equal, round, and reactive to light. Conjunctivae  and EOM are normal.  Neck: Normal range of motion. Neck supple. Carotid bruit is not present. No thyromegaly present.  Cardiovascular: Normal rate, regular rhythm, normal heart sounds and intact distal pulses.  Exam reveals no gallop and no friction rub.   No murmur heard. Pulmonary/Chest: Effort normal and breath sounds normal. She has no wheezes. She has no rales.  Abdominal: Soft. Bowel sounds are normal. She exhibits no distension and no mass. There is no tenderness. There is no rebound and no guarding.  Lymphadenopathy:    She has no cervical adenopathy.  Neurological: She is alert and oriented to person, place, and time. No cranial nerve deficit. She exhibits normal muscle tone. Coordination normal.  Skin: Skin is warm and dry. No rash noted. She is not diaphoretic. No erythema. No pallor.  Psychiatric: She has a normal mood and affect. Her behavior is normal.    No results found. Depression  screen [facility] 2/9 06/17/2017 04/18/2017 09/18/2016 08/19/2016 08/13/2016  Decreased Interest 0 0 0 0 0  Down, Depressed, Hopeless 0 0 0 0 0  PHQ - 2 Score 0 0 0 0 0   Fall Risk  06/17/2017 04/18/2017 09/18/2016 08/13/2016 08/06/2016  Falls in the past year? Yes No No Yes Yes  Number falls in past yr: 1 - - 1 2 or more  Comment - - - - fall x 3 got hurt only once  Injury with Fall? - - - Yes Yes  Comment - - - Cracked rib, hurt knee -    MMSE - Mini Mental State Exam 07/23/2017 04/18/2017  Orientation to time 2 5  Orientation to Place 5 5  Registration 3 1  Attention/ Calculation 0 0  Recall 0 1  Language- name 2 objects 2 2  Language- repeat 1 1  Language- follow 3 step command 3 3  Language- read & follow direction 1 1  Write a sentence 1 1  Copy design 0 1  Total score 18 21        Assessment & Plan:   1. Late onset Alzheimer's disease without behavioral disturbance   2. Moderate persistent asthma without complication   3. Type 2 diabetes mellitus without complication, without  long-term current use of insulin ([place])   4. Lightheaded   5. HYPERTENSION, BENIGN SYSTEMIC   6. Hypothyroidism due to acquired atrophy of thyroid   7. Need for prophylactic vaccination against Streptococcus pneumoniae (pneumococcus)   8. Pleural effusion, left   9. Acute deep vein thrombosis (DVT) of right popliteal vein (H[facility])    -worsening MMSE despite Aricept; will add Namenda.  No longer driving.  Home Health started initial assessment last week to determine patient and family needs. Will need to increase dose of Namenda if tolerates well.  No underlying depression or anxiety yet a bit irritable; consider addition of Risperdal.  -asthma under better control with Pulmicort nebulizer bid.   -pleural effusions persistent per CXR in 05/2017 yet stable; no respiratory distress at this time.  No hypoxia. -DMII controlled; repeat labs; continue Metformin. -lightheaded during physical therapy last week; obtain labs to rule out secondary causes. -TSH 4.6 at last visit thus supplementation not warranted. -stablee DVT in lower extremity; continue Xarelto for six months only due to high fall risk with Alzheimer's dementia.   Orders Placed This Encounter  Procedures  . Pneumococcal polysaccharide vaccine 23-valent greater than or equal to 2yo subcutaneous/IM  . CBC with Differential/Platelet  . Comprehensive metabolic panel  . TSH  . Care order/instruction:    Scheduling Instructions:     Perform mini mental status exam  . POCT glucose (manual entry)  . POCT glycosylated hemoglobin (Hb A1C)   Meds ordered this encounter  Medications  . donepezil (ARICEPT) 10 MG tablet    Sig: Take 1 tablet (10 mg total) by mouth at bedtime.    Dispense:  90 tablet    Refill:  1  . memantine (NAMENDA) 5 MG tablet    Sig: Take 1 tablet (5 mg total) by mouth 2 (two) times daily.    Dispense:  60 tablet    Refill:  5    Return in about 6 weeks (around 09/03/2017) for follow-up chronic medical  conditions.   Sheyenne Konz Elayne Guerin, M.D. Primary Care at [facility] previously [facility] [place] [address] [place], [place]  [address] [phone] phone [phone] fax

## 2017-07-23 ENCOUNTER — Encounter: Payer: Self-pay | Admitting: Family Medicine

## 2017-07-23 ENCOUNTER — Ambulatory Visit (INDEPENDENT_AMBULATORY_CARE_PROVIDER_SITE_OTHER): Payer: Medicare Other | Admitting: Family Medicine

## 2017-07-23 VITALS — BP 126/72 | HR 57 | Temp 98.0°F | Resp 16 | Ht 65.75 in | Wt 124.0 lb

## 2017-07-23 DIAGNOSIS — R42 Dizziness and giddiness: Secondary | ICD-10-CM | POA: Diagnosis not present

## 2017-07-23 DIAGNOSIS — E034 Atrophy of thyroid (acquired): Secondary | ICD-10-CM

## 2017-07-23 DIAGNOSIS — G301 Alzheimer's disease with late onset: Secondary | ICD-10-CM

## 2017-07-23 DIAGNOSIS — E119 Type 2 diabetes mellitus without complications: Secondary | ICD-10-CM | POA: Diagnosis not present

## 2017-07-23 DIAGNOSIS — I82431 Acute embolism and thrombosis of right popliteal vein: Secondary | ICD-10-CM

## 2017-07-23 DIAGNOSIS — I2510 Atherosclerotic heart disease of native coronary artery without angina pectoris: Secondary | ICD-10-CM | POA: Diagnosis not present

## 2017-07-23 DIAGNOSIS — J4540 Moderate persistent asthma, uncomplicated: Secondary | ICD-10-CM

## 2017-07-23 DIAGNOSIS — F0280 Dementia in other diseases classified elsewhere without behavioral disturbance: Secondary | ICD-10-CM

## 2017-07-23 DIAGNOSIS — Z23 Encounter for immunization: Secondary | ICD-10-CM | POA: Diagnosis not present

## 2017-07-23 DIAGNOSIS — I10 Essential (primary) hypertension: Secondary | ICD-10-CM

## 2017-07-23 DIAGNOSIS — J90 Pleural effusion, not elsewhere classified: Secondary | ICD-10-CM | POA: Diagnosis not present

## 2017-07-23 LAB — GLUCOSE, POCT (MANUAL RESULT ENTRY): POC GLUCOSE: 169 mg/dL — AB (ref 70–99)

## 2017-07-23 LAB — POCT GLYCOSYLATED HEMOGLOBIN (HGB A1C): Hemoglobin A1C: 7

## 2017-07-23 MED ORDER — DONEPEZIL HCL 10 MG PO TABS: 10.0000 mg | ORAL_TABLET | Freq: Every day | ORAL | 1 refills | Status: DC

## 2017-07-23 MED ORDER — MEMANTINE HCL 5 MG PO TABS: 5.0000 mg | ORAL_TABLET | Freq: Two times a day (BID) | ORAL | 5 refills | Status: DC

## 2017-07-23 NOTE — Patient Instructions (Signed)
     IF you received an x-ray today, you will receive an invoice from [facility]. Please contact [facility] at [phone] with questions or concerns regarding your invoice.   IF you received labwork today, you will receive an invoice from [facility]. Please contact [facility] at [phone] with questions or concerns regarding your invoice.   Our billing staff will not be able to assist you with questions regarding bills from these companies.  You will be contacted with the lab results as soon as they are available. The fastest way to get your results is to activate your My Chart account. Instructions are located on the last page of this paperwork. If you have not heard from us regarding the results in 2 weeks, please contact this office.     

## 2017-07-24 DIAGNOSIS — E119 Type 2 diabetes mellitus without complications: Secondary | ICD-10-CM | POA: Diagnosis not present

## 2017-07-24 DIAGNOSIS — J90 Pleural effusion, not elsewhere classified: Secondary | ICD-10-CM | POA: Diagnosis not present

## 2017-07-24 DIAGNOSIS — I82401 Acute embolism and thrombosis of unspecified deep veins of right lower extremity: Secondary | ICD-10-CM | POA: Diagnosis not present

## 2017-07-24 DIAGNOSIS — R29898 Other symptoms and signs involving the musculoskeletal system: Secondary | ICD-10-CM | POA: Diagnosis not present

## 2017-07-24 DIAGNOSIS — G301 Alzheimer's disease with late onset: Secondary | ICD-10-CM | POA: Diagnosis not present

## 2017-07-24 DIAGNOSIS — F0280 Dementia in other diseases classified elsewhere without behavioral disturbance: Secondary | ICD-10-CM | POA: Diagnosis not present

## 2017-07-24 LAB — CBC WITH DIFFERENTIAL/PLATELET
BASOS: 1 %
Basophils Absolute: 0 10*3/uL (ref 0.0–0.2)
EOS (ABSOLUTE): 0.5 10*3/uL — AB (ref 0.0–0.4)
Eos: 6 %
Hematocrit: 39 % (ref 34.0–46.6)
Hemoglobin: 13.1 g/dL (ref 11.1–15.9)
IMMATURE GRANS (ABS): 0 10*3/uL (ref 0.0–0.1)
Immature Granulocytes: 0 %
Lymphocytes Absolute: 1 10*3/uL (ref 0.7–3.1)
Lymphs: 11 %
MCH: 32.1 pg (ref 26.6–33.0)
MCHC: 33.6 g/dL (ref 31.5–35.7)
MCV: 96 fL (ref 79–97)
MONOS ABS: 0.7 10*3/uL (ref 0.1–0.9)
Monocytes: 8 %
NEUTROS ABS: 6.3 10*3/uL (ref 1.4–7.0)
Neutrophils: 74 %
PLATELETS: 259 10*3/uL (ref 150–379)
RBC: 4.08 x10E6/uL (ref 3.77–5.28)
RDW: 13.2 % (ref 12.3–15.4)
WBC: 8.5 10*3/uL (ref 3.4–10.8)

## 2017-07-24 LAB — COMPREHENSIVE METABOLIC PANEL
A/G RATIO: 1.4 (ref 1.2–2.2)
ALT: 15 IU/L (ref 0–32)
AST: 20 IU/L (ref 0–40)
Albumin: 3.6 g/dL (ref 3.5–4.7)
Alkaline Phosphatase: 59 IU/L (ref 39–117)
BILIRUBIN TOTAL: 0.4 mg/dL (ref 0.0–1.2)
BUN / CREAT RATIO: 27 (ref 12–28)
BUN: 23 mg/dL (ref 8–27)
CHLORIDE: 100 mmol/L (ref 96–106)
CO2: 29 mmol/L (ref 20–29)
Calcium: 10.3 mg/dL (ref 8.7–10.3)
Creatinine, Ser: 0.86 mg/dL (ref 0.57–1.00)
GFR calc non Af Amer: 62 mL/min/{1.73_m2} (ref >59)
GFR, EST AFRICAN AMERICAN: 72 mL/min/{1.73_m2} (ref >59)
GLOBULIN, TOTAL: 2.6 g/dL (ref 1.5–4.5)
Glucose: 166 mg/dL — ABNORMAL HIGH (ref 65–99)
POTASSIUM: 5.4 mmol/L — AB (ref 3.5–5.2)
SODIUM: 140 mmol/L (ref 134–144)
TOTAL PROTEIN: 6.2 g/dL (ref 6.0–8.5)

## 2017-07-24 LAB — TSH: TSH: 4.92 u[IU]/mL — ABNORMAL HIGH (ref 0.450–4.500)

## 2017-07-25 DIAGNOSIS — F0280 Dementia in other diseases classified elsewhere without behavioral disturbance: Secondary | ICD-10-CM | POA: Diagnosis not present

## 2017-07-25 DIAGNOSIS — G301 Alzheimer's disease with late onset: Secondary | ICD-10-CM | POA: Diagnosis not present

## 2017-07-25 DIAGNOSIS — E119 Type 2 diabetes mellitus without complications: Secondary | ICD-10-CM | POA: Diagnosis not present

## 2017-07-25 DIAGNOSIS — J90 Pleural effusion, not elsewhere classified: Secondary | ICD-10-CM | POA: Diagnosis not present

## 2017-07-25 DIAGNOSIS — R29898 Other symptoms and signs involving the musculoskeletal system: Secondary | ICD-10-CM | POA: Diagnosis not present

## 2017-07-25 DIAGNOSIS — I82401 Acute embolism and thrombosis of unspecified deep veins of right lower extremity: Secondary | ICD-10-CM | POA: Diagnosis not present

## 2017-07-28 DIAGNOSIS — J90 Pleural effusion, not elsewhere classified: Secondary | ICD-10-CM | POA: Diagnosis not present

## 2017-07-28 DIAGNOSIS — I82401 Acute embolism and thrombosis of unspecified deep veins of right lower extremity: Secondary | ICD-10-CM | POA: Diagnosis not present

## 2017-07-28 DIAGNOSIS — F0280 Dementia in other diseases classified elsewhere without behavioral disturbance: Secondary | ICD-10-CM | POA: Diagnosis not present

## 2017-07-28 DIAGNOSIS — R29898 Other symptoms and signs involving the musculoskeletal system: Secondary | ICD-10-CM | POA: Diagnosis not present

## 2017-07-28 DIAGNOSIS — E119 Type 2 diabetes mellitus without complications: Secondary | ICD-10-CM | POA: Diagnosis not present

## 2017-07-28 DIAGNOSIS — G301 Alzheimer's disease with late onset: Secondary | ICD-10-CM | POA: Diagnosis not present

## 2017-07-30 ENCOUNTER — Telehealth: Payer: Self-pay | Admitting: Family Medicine

## 2017-07-30 DIAGNOSIS — I82401 Acute embolism and thrombosis of unspecified deep veins of right lower extremity: Secondary | ICD-10-CM | POA: Diagnosis not present

## 2017-07-30 DIAGNOSIS — G301 Alzheimer's disease with late onset: Secondary | ICD-10-CM | POA: Diagnosis not present

## 2017-07-30 DIAGNOSIS — J90 Pleural effusion, not elsewhere classified: Secondary | ICD-10-CM | POA: Diagnosis not present

## 2017-07-30 DIAGNOSIS — R29898 Other symptoms and signs involving the musculoskeletal system: Secondary | ICD-10-CM | POA: Diagnosis not present

## 2017-07-30 DIAGNOSIS — F0280 Dementia in other diseases classified elsewhere without behavioral disturbance: Secondary | ICD-10-CM | POA: Diagnosis not present

## 2017-07-30 DIAGNOSIS — E119 Type 2 diabetes mellitus without complications: Secondary | ICD-10-CM | POA: Diagnosis not present

## 2017-07-30 NOTE — Telephone Encounter (Signed)
Flonnie Hailstone from [facility] called stating she was following up on OT orders to get verbal orders. She stated she needed 1 week for 1 week and then 2 times a week for 3 weeks for safety, ADL, independence, functioning. She said she can be reached at [phone]. Thanks.

## 2017-07-31 NOTE — Telephone Encounter (Signed)
[name] checking on this request.

## 2017-08-01 ENCOUNTER — Encounter: Payer: Self-pay | Admitting: Family Medicine

## 2017-08-01 DIAGNOSIS — R29898 Other symptoms and signs involving the musculoskeletal system: Secondary | ICD-10-CM | POA: Diagnosis not present

## 2017-08-01 DIAGNOSIS — J90 Pleural effusion, not elsewhere classified: Secondary | ICD-10-CM | POA: Diagnosis not present

## 2017-08-01 DIAGNOSIS — F0280 Dementia in other diseases classified elsewhere without behavioral disturbance: Secondary | ICD-10-CM | POA: Diagnosis not present

## 2017-08-01 DIAGNOSIS — E119 Type 2 diabetes mellitus without complications: Secondary | ICD-10-CM | POA: Diagnosis not present

## 2017-08-01 DIAGNOSIS — G301 Alzheimer's disease with late onset: Secondary | ICD-10-CM | POA: Diagnosis not present

## 2017-08-01 DIAGNOSIS — I82401 Acute embolism and thrombosis of unspecified deep veins of right lower extremity: Secondary | ICD-10-CM | POA: Diagnosis not present

## 2017-08-04 DIAGNOSIS — F0280 Dementia in other diseases classified elsewhere without behavioral disturbance: Secondary | ICD-10-CM | POA: Diagnosis not present

## 2017-08-04 DIAGNOSIS — J90 Pleural effusion, not elsewhere classified: Secondary | ICD-10-CM | POA: Diagnosis not present

## 2017-08-04 DIAGNOSIS — R29898 Other symptoms and signs involving the musculoskeletal system: Secondary | ICD-10-CM | POA: Diagnosis not present

## 2017-08-04 DIAGNOSIS — I82401 Acute embolism and thrombosis of unspecified deep veins of right lower extremity: Secondary | ICD-10-CM | POA: Diagnosis not present

## 2017-08-04 DIAGNOSIS — G301 Alzheimer's disease with late onset: Secondary | ICD-10-CM | POA: Diagnosis not present

## 2017-08-04 DIAGNOSIS — E119 Type 2 diabetes mellitus without complications: Secondary | ICD-10-CM | POA: Diagnosis not present

## 2017-08-06 DIAGNOSIS — F0280 Dementia in other diseases classified elsewhere without behavioral disturbance: Secondary | ICD-10-CM | POA: Diagnosis not present

## 2017-08-06 DIAGNOSIS — E119 Type 2 diabetes mellitus without complications: Secondary | ICD-10-CM | POA: Diagnosis not present

## 2017-08-06 DIAGNOSIS — G301 Alzheimer's disease with late onset: Secondary | ICD-10-CM | POA: Diagnosis not present

## 2017-08-06 DIAGNOSIS — I82401 Acute embolism and thrombosis of unspecified deep veins of right lower extremity: Secondary | ICD-10-CM | POA: Diagnosis not present

## 2017-08-06 DIAGNOSIS — R29898 Other symptoms and signs involving the musculoskeletal system: Secondary | ICD-10-CM | POA: Diagnosis not present

## 2017-08-06 DIAGNOSIS — J90 Pleural effusion, not elsewhere classified: Secondary | ICD-10-CM | POA: Diagnosis not present

## 2017-08-06 NOTE — Telephone Encounter (Signed)
IC [name]/[facility] to give verbal orders.   Gave orders per request.  She states Dr. Tamala Julian called Fri and gave same orders

## 2017-08-08 DIAGNOSIS — F0280 Dementia in other diseases classified elsewhere without behavioral disturbance: Secondary | ICD-10-CM | POA: Diagnosis not present

## 2017-08-08 DIAGNOSIS — G301 Alzheimer's disease with late onset: Secondary | ICD-10-CM | POA: Diagnosis not present

## 2017-08-08 DIAGNOSIS — E119 Type 2 diabetes mellitus without complications: Secondary | ICD-10-CM | POA: Diagnosis not present

## 2017-08-08 DIAGNOSIS — I82401 Acute embolism and thrombosis of unspecified deep veins of right lower extremity: Secondary | ICD-10-CM | POA: Diagnosis not present

## 2017-08-08 DIAGNOSIS — J90 Pleural effusion, not elsewhere classified: Secondary | ICD-10-CM | POA: Diagnosis not present

## 2017-08-08 DIAGNOSIS — R29898 Other symptoms and signs involving the musculoskeletal system: Secondary | ICD-10-CM | POA: Diagnosis not present

## 2017-08-11 DIAGNOSIS — F0280 Dementia in other diseases classified elsewhere without behavioral disturbance: Secondary | ICD-10-CM | POA: Diagnosis not present

## 2017-08-11 DIAGNOSIS — J90 Pleural effusion, not elsewhere classified: Secondary | ICD-10-CM | POA: Diagnosis not present

## 2017-08-11 DIAGNOSIS — R29898 Other symptoms and signs involving the musculoskeletal system: Secondary | ICD-10-CM | POA: Diagnosis not present

## 2017-08-11 DIAGNOSIS — E119 Type 2 diabetes mellitus without complications: Secondary | ICD-10-CM | POA: Diagnosis not present

## 2017-08-11 DIAGNOSIS — G301 Alzheimer's disease with late onset: Secondary | ICD-10-CM | POA: Diagnosis not present

## 2017-08-11 DIAGNOSIS — I82401 Acute embolism and thrombosis of unspecified deep veins of right lower extremity: Secondary | ICD-10-CM | POA: Diagnosis not present

## 2017-08-13 DIAGNOSIS — R29898 Other symptoms and signs involving the musculoskeletal system: Secondary | ICD-10-CM | POA: Diagnosis not present

## 2017-08-13 DIAGNOSIS — I82401 Acute embolism and thrombosis of unspecified deep veins of right lower extremity: Secondary | ICD-10-CM | POA: Diagnosis not present

## 2017-08-13 DIAGNOSIS — J90 Pleural effusion, not elsewhere classified: Secondary | ICD-10-CM | POA: Diagnosis not present

## 2017-08-13 DIAGNOSIS — F0280 Dementia in other diseases classified elsewhere without behavioral disturbance: Secondary | ICD-10-CM | POA: Diagnosis not present

## 2017-08-13 DIAGNOSIS — E119 Type 2 diabetes mellitus without complications: Secondary | ICD-10-CM | POA: Diagnosis not present

## 2017-08-13 DIAGNOSIS — G301 Alzheimer's disease with late onset: Secondary | ICD-10-CM | POA: Diagnosis not present

## 2017-08-15 ENCOUNTER — Telehealth: Payer: Self-pay | Admitting: Family Medicine

## 2017-08-15 NOTE — Telephone Encounter (Signed)
The Nurse from [facility] wanted Dr Tamala Julian to know that Mr [name] had changed the Nurses  visit with patient from today to next Friday Nov 2nd he had to go out of town

## 2017-08-17 ENCOUNTER — Other Ambulatory Visit: Payer: Self-pay | Admitting: Family Medicine

## 2017-08-19 DIAGNOSIS — R29898 Other symptoms and signs involving the musculoskeletal system: Secondary | ICD-10-CM | POA: Diagnosis not present

## 2017-08-19 DIAGNOSIS — E119 Type 2 diabetes mellitus without complications: Secondary | ICD-10-CM | POA: Diagnosis not present

## 2017-08-19 DIAGNOSIS — J90 Pleural effusion, not elsewhere classified: Secondary | ICD-10-CM | POA: Diagnosis not present

## 2017-08-19 DIAGNOSIS — G301 Alzheimer's disease with late onset: Secondary | ICD-10-CM | POA: Diagnosis not present

## 2017-08-19 DIAGNOSIS — F0280 Dementia in other diseases classified elsewhere without behavioral disturbance: Secondary | ICD-10-CM | POA: Diagnosis not present

## 2017-08-19 DIAGNOSIS — I82401 Acute embolism and thrombosis of unspecified deep veins of right lower extremity: Secondary | ICD-10-CM | POA: Diagnosis not present

## 2017-08-20 DIAGNOSIS — R29898 Other symptoms and signs involving the musculoskeletal system: Secondary | ICD-10-CM | POA: Diagnosis not present

## 2017-08-20 DIAGNOSIS — J90 Pleural effusion, not elsewhere classified: Secondary | ICD-10-CM | POA: Diagnosis not present

## 2017-08-20 DIAGNOSIS — E119 Type 2 diabetes mellitus without complications: Secondary | ICD-10-CM | POA: Diagnosis not present

## 2017-08-20 DIAGNOSIS — F0280 Dementia in other diseases classified elsewhere without behavioral disturbance: Secondary | ICD-10-CM | POA: Diagnosis not present

## 2017-08-20 DIAGNOSIS — I82401 Acute embolism and thrombosis of unspecified deep veins of right lower extremity: Secondary | ICD-10-CM | POA: Diagnosis not present

## 2017-08-20 DIAGNOSIS — G301 Alzheimer's disease with late onset: Secondary | ICD-10-CM | POA: Diagnosis not present

## 2017-08-22 DIAGNOSIS — G301 Alzheimer's disease with late onset: Secondary | ICD-10-CM | POA: Diagnosis not present

## 2017-08-22 DIAGNOSIS — R29898 Other symptoms and signs involving the musculoskeletal system: Secondary | ICD-10-CM | POA: Diagnosis not present

## 2017-08-22 DIAGNOSIS — J90 Pleural effusion, not elsewhere classified: Secondary | ICD-10-CM | POA: Diagnosis not present

## 2017-08-22 DIAGNOSIS — I82401 Acute embolism and thrombosis of unspecified deep veins of right lower extremity: Secondary | ICD-10-CM | POA: Diagnosis not present

## 2017-08-22 DIAGNOSIS — F0280 Dementia in other diseases classified elsewhere without behavioral disturbance: Secondary | ICD-10-CM | POA: Diagnosis not present

## 2017-08-22 DIAGNOSIS — E119 Type 2 diabetes mellitus without complications: Secondary | ICD-10-CM | POA: Diagnosis not present

## 2017-08-22 NOTE — Telephone Encounter (Signed)
Remo Lipps called from [facility] wanting Dr. Tamala Julian to know pt's husband didn't want her to be seen today. Remo Lipps would like pt to be seen one day next week for her discharge visit. Please call if any questions at [phone]

## 2017-08-29 DIAGNOSIS — R29898 Other symptoms and signs involving the musculoskeletal system: Secondary | ICD-10-CM | POA: Diagnosis not present

## 2017-08-29 DIAGNOSIS — J90 Pleural effusion, not elsewhere classified: Secondary | ICD-10-CM | POA: Diagnosis not present

## 2017-08-29 DIAGNOSIS — G301 Alzheimer's disease with late onset: Secondary | ICD-10-CM | POA: Diagnosis not present

## 2017-08-29 DIAGNOSIS — F0280 Dementia in other diseases classified elsewhere without behavioral disturbance: Secondary | ICD-10-CM | POA: Diagnosis not present

## 2017-08-29 DIAGNOSIS — E119 Type 2 diabetes mellitus without complications: Secondary | ICD-10-CM | POA: Diagnosis not present

## 2017-08-29 DIAGNOSIS — I82401 Acute embolism and thrombosis of unspecified deep veins of right lower extremity: Secondary | ICD-10-CM | POA: Diagnosis not present

## 2017-09-03 ENCOUNTER — Other Ambulatory Visit: Payer: Self-pay

## 2017-09-03 ENCOUNTER — Encounter: Payer: Self-pay | Admitting: Family Medicine

## 2017-09-03 ENCOUNTER — Ambulatory Visit (INDEPENDENT_AMBULATORY_CARE_PROVIDER_SITE_OTHER): Payer: Medicare Other | Admitting: Family Medicine

## 2017-09-03 VITALS — BP 120/62 | HR 74 | Temp 97.8°F | Resp 16 | Ht 66.54 in | Wt 124.6 lb

## 2017-09-03 DIAGNOSIS — J90 Pleural effusion, not elsewhere classified: Secondary | ICD-10-CM

## 2017-09-03 DIAGNOSIS — F0280 Dementia in other diseases classified elsewhere without behavioral disturbance: Secondary | ICD-10-CM | POA: Diagnosis not present

## 2017-09-03 DIAGNOSIS — R6889 Other general symptoms and signs: Secondary | ICD-10-CM | POA: Diagnosis not present

## 2017-09-03 DIAGNOSIS — G301 Alzheimer's disease with late onset: Secondary | ICD-10-CM | POA: Diagnosis not present

## 2017-09-03 DIAGNOSIS — I2510 Atherosclerotic heart disease of native coronary artery without angina pectoris: Secondary | ICD-10-CM | POA: Diagnosis not present

## 2017-09-03 DIAGNOSIS — J4540 Moderate persistent asthma, uncomplicated: Secondary | ICD-10-CM

## 2017-09-03 DIAGNOSIS — I82431 Acute embolism and thrombosis of right popliteal vein: Secondary | ICD-10-CM

## 2017-09-03 MED ORDER — ALBUTEROL SULFATE HFA 108 (90 BASE) MCG/ACT IN AERS: INHALATION_SPRAY | RESPIRATORY_TRACT | 5 refills | Status: DC

## 2017-09-03 MED ORDER — BUDESONIDE 0.25 MG/2ML IN SUSP: 0.2500 mg | Freq: Every day | RESPIRATORY_TRACT | 12 refills | Status: DC

## 2017-09-03 MED ORDER — CITALOPRAM HYDROBROMIDE 10 MG PO TABS: 10.0000 mg | ORAL_TABLET | Freq: Every day | ORAL | 1 refills | Status: DC

## 2017-09-03 MED ORDER — ALBUTEROL SULFATE (2.5 MG/3ML) 0.083% IN NEBU: 2.5000 mg | INHALATION_SOLUTION | Freq: Four times a day (QID) | RESPIRATORY_TRACT | 5 refills | Status: DC | PRN

## 2017-09-03 MED ORDER — ZOSTER VAC RECOMB ADJUVANTED 50 MCG/0.5ML IM SUSR: 0.5000 mL | Freq: Once | INTRAMUSCULAR | 1 refills | Status: AC

## 2017-09-03 NOTE — Progress Notes (Signed)
Subjective:    Patient ID: [name], female    DOB: 05-May-1931, 83 y.o.   MRN: [id number]  09/03/2017  Alzheimer (6 week follow-up) and Asthma    HPI This 83 y.o. female presents for evaluation of Alzheimer's dementia and asthma and hypertension and diabetes and pleural effusions.  Management changes at last visit include the following:  worsening MMSE despite Aricept; will add Namenda.  No longer driving.  Home Health started initial assessment last week to determine patient and family needs. Will need to increase dose of Namenda if tolerates well.  No underlying depression or anxiety yet a bit irritable; consider addition of Risperdal.  -asthma under better control with Pulmicort nebulizer bid.   -pleural effusions persistent per CXR in 05/2017 yet stable; no respiratory distress at this time.  No hypoxia. -DMII controlled; repeat labs; continue Metformin. -lightheaded during physical therapy last week; obtain labs to rule out secondary causes. -TSH 4.6 at last visit thus supplementation not warranted. -stablee DVT in lower extremity; continue Xarelto for six months only due to high fall risk with Alzheimer's dementia  Using ProAir much less. Pulmicort nebulizer atleast once per day when needed.   Sleeping 16 hours per day.  Started one year ago October 2017.  Working on bringing someone in.  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  Physical Therapy finished last week.  Fell yesterday; did not hurt anything.  [name] got patient up.  No driving in one year.  BP Readings from Last 3 Encounters:  09/03/17 120/62  07/23/17 126/72  06/17/17 124/65   Wt Readings from Last 3 Encounters:  09/03/17 124 lb 9.6 oz (56.5 kg)  07/23/17 124 lb (56.2 kg)  04/18/17 123 lb (55.8 kg)   Immunization History  Administered Date(s) Administered  . Influenza Split 08/21/2012, 08/22/2015  . Influenza,inj,Quad PF,6+ Mos 08/19/2016, 06/17/2017  . Influenza-Unspecified 08/21/2013  . Pneumococcal Conjugate-13  10/06/2014  . Pneumococcal Polysaccharide-23 08/21/2005, 07/23/2017  . Td 02/19/2004  . Zoster 06/21/2012    Review of Systems  Constitutional: Negative for chills, diaphoresis, fatigue and fever.  Eyes: Negative for visual disturbance.  Respiratory: Negative for cough and shortness of breath.   Cardiovascular: Negative for chest pain, palpitations and leg swelling.  Gastrointestinal: Negative for abdominal pain, constipation, diarrhea, nausea and vomiting.  Endocrine: Negative for cold intolerance, heat intolerance, polydipsia, polyphagia and polyuria.  Neurological: Negative for dizziness, tremors, seizures, syncope, facial asymmetry, speech difficulty, weakness, light-headedness, numbness and headaches.    Past Medical History:  Diagnosis Date  . Alzheimer's dementia without behavioral disturbance    diagnosed 12/2015; Dr. Jade/neurology  . Anemia   . Asthma   . Blood transfusion 05-24-12  . Blood transfusion without reported diagnosis   . Chronic kidney disease    dr Clover Mealy  . Diabetes mellitus   . Diverticulosis   . DVT (deep venous thrombosis) ([place]) 03/2005   hx.left leg  . Esophageal stricture   . GERD (gastroesophageal reflux disease)   . Glaucoma   . Hiatal hernia 05/07/13  . Hyperlipidemia   . Hypertension   . Osteoarthritis   . Pancreatic duct dilated 05/07/13  . Pulmonary nodules 05/07/13  . PVD (peripheral vascular disease) ([place])   . Tubular adenoma of colon    Past Surgical History:  Procedure Laterality Date  . CATARACT EXTRACTION, BILATERAL    . COLONOSCOPY  05/27/2012   Procedure: COLONOSCOPY;  Surgeon: Lafayette Dragon, MD;  Location: [facility];  Service: Endoscopy;  Laterality: N/A;  . ESOPHAGOGASTRODUODENOSCOPY  05/26/2012  Procedure: ESOPHAGOGASTRODUODENOSCOPY (EGD);  Surgeon: Lafayette Dragon, MD;  Location: [facility];  Service: Endoscopy;  Laterality: N/A;  . EUS N/A 07/08/2013   Procedure: UPPER ENDOSCOPIC ULTRASOUND (EUS) LINEAR;  Surgeon: Milus Banister, MD;  Location: [facility];  Service: Endoscopy;  Laterality: N/A;  need side view scope  . EYE SURGERY    . VASCULAR SURGERY  09/2004  . WRIST FRACTURE SURGERY     R wrist fracture, plate   No Known Allergies Current Outpatient Medications on File Prior to Visit  Medication Sig Dispense Refill  . Ascorbic Acid (VITAMIN C) 1000 MG tablet Take 1,000 mg by mouth daily.    Marland Kitchen aspirin 81 MG tablet Take 81 mg by mouth daily.    . brimonidine (ALPHAGAN P) 0.1 % SOLN INSTILL 1 DROP IN LEFT EYE TWICE DAILY.    . calcium carbonate (OS-CAL) 600 MG TABS Take 600 mg by mouth 2 (two) times daily with a meal.    . donepezil (ARICEPT) 10 MG tablet Take 1 tablet (10 mg total) by mouth at bedtime. 90 tablet 1  . IRON PO Take 1 day or 1 dose by mouth.    . memantine (NAMENDA) 5 MG tablet Take 1 tablet (5 mg total) by mouth 2 (two) times daily. 60 tablet 5  . metFORMIN (GLUCOPHAGE) 500 MG tablet TAKE 1 TABLET ONCE DAILY WITH SUPPER. 90 tablet 3  . metoprolol tartrate (LOPRESSOR) 25 MG tablet Take 1 tablet (25 mg total) by mouth 2 (two) times daily. 180 tablet 3  . Multiple Vitamin (MULTIVITAMIN WITH MINERALS) TABS tablet Take 1 tablet by mouth daily.    [name] 20 MG TABS tablet TAKE 1 TABLET ONCE DAILY WITH SUPPER. 90 tablet 0   No current facility-administered medications on file prior to visit.    Social History   Socioeconomic History  . Marital status: Married    Spouse name: Not on file  . Number of children: Not on file  . Years of education: Not on file  . Highest education level: Not on file  Social Needs  . Financial resource strain: Not on file  . Food insecurity - worry: Not on file  . Food insecurity - inability: Not on file  . Transportation needs - medical: Not on file  . Transportation needs - non-medical: Not on file  Occupational History  . Not on file  Tobacco Use  . Smoking status: Never Smoker  . Smokeless tobacco: Never Used  Substance and Sexual Activity  .  Alcohol use: No  . Drug use: No  . Sexual activity: Not Currently  Other Topics Concern  . Not on file  Social History Narrative   Marital status: married x 60 years - Husband is Mekhia Brogan, Brooke Bonito (former Teacher, English as a foreign language of [employer]) -- they have been together since she was in 6th grade!!      Children:  4 children; 11 grandchildren; 2 gg      Lives: with husband, dog      Employment: Retired      Tobacco: never      Alcohol: none      Exercise:  Walking daily short distances; previous tennis player      ADLs:  Walks with cane; cleans and cooks; NO DRIVING SINCE 2703.      Born: [place], [place]   Has 2 years of college.    Family History  Adopted: Yes  Problem Relation Age of Onset  . Migraines Daughter   .  Cancer Son   . Colon cancer Neg Hx        Objective:    BP 120/62   Pulse 74   Temp 97.8 F (36.6 C) (Oral)   Resp 16   Ht 5' 6.54" (1.69 m)   Wt 124 lb 9.6 oz (56.5 kg)   SpO2 91%   BMI 19.79 kg/m  Physical Exam  Constitutional: She is oriented to person, place, and time. She appears well-developed and well-nourished. No distress.  HENT:  Head: Normocephalic and atraumatic.  Right Ear: External ear normal.  Left Ear: External ear normal.  Nose: Nose normal.  Mouth/Throat: Oropharynx is clear and moist.  Eyes: Conjunctivae and EOM are normal. Pupils are equal, round, and reactive to light.  Neck: Normal range of motion. Neck supple. Carotid bruit is not present. No thyromegaly present.  Cardiovascular: Normal rate, regular rhythm, normal heart sounds and intact distal pulses. Exam reveals no gallop and no friction rub.  No murmur heard. Pulmonary/Chest: Effort normal and breath sounds normal. She has no wheezes. She has no rales.  Abdominal: Soft. Bowel sounds are normal. She exhibits no distension and no mass. There is no tenderness. There is no rebound and no guarding.  Lymphadenopathy:    She has no cervical adenopathy.  Neurological: She is alert and oriented to  person, place, and time. No cranial nerve deficit.  Skin: Skin is warm and dry. No rash noted. She is not diaphoretic. No erythema. No pallor.  Psychiatric: She has a normal mood and affect. Her behavior is normal.   No results found. Depression screen [facility] 2/9 09/03/2017 06/17/2017 04/18/2017 09/18/2016 08/19/2016  Decreased Interest 0 0 0 0 0  Down, Depressed, Hopeless 0 0 0 0 0  PHQ - 2 Score 0 0 0 0 0   Fall Risk  09/03/2017 06/17/2017 04/18/2017 09/18/2016 08/13/2016  Falls in the past year? Yes Yes No No Yes  Number falls in past yr: 1 1 - - 1  Comment - - - - -  Injury with Fall? No - - - Yes  Comment - - - - Cracked rib, hurt knee    MMSE - Mini Mental State Exam 09/03/2017 07/23/2017 04/18/2017  Orientation to time 1 2 5   Orientation to Place 5 5 5   Registration 3 3 1   Attention/ Calculation 1 0 0  Recall 1 0 1  Language- name 2 objects 2 2 2   Language- repeat 1 1 1   Language- follow 3 step command 2 3 3   Language- read & follow direction 1 1 1   Write a sentence 1 1 1   Copy design 0 0 1  Total score 18 18 21         Assessment & Plan:   1. Late onset Alzheimer's disease without behavioral disturbance   2. Acute deep vein thrombosis (DVT) of right popliteal vein (HCC)   3. Moderate persistent asthma without complication   4. Pleural effusion, left   5. Exercise intolerance     Late onset Alzheimer's disease with mild behavioral disturbance.  Tolerating addition of Namenda to Aricept therapy.  Consider increasing Namenda at next visit.  We will continue citalopram as well for irritability.  Status post physical therapy and home health evaluation to support family.  Husband present today and reporting that they will have medical assistant at home during the day with patient to assist with bathing and ADLs.  Patient no longer driving as appropriate with dementia.  Weight is stable which is reassuring.  Refill of Xarelto provided.  1 refill completed, patient can  discontinue.  She will have completed 6 months of Xarelto therapy for DVT in the right leg.  Asthma improved control with nebulizer administered Pulmicort and albuterol.  Refills provided today.  Left pleural effusion stable without difficulties.  Recent chest x-ray showed stable small pleural effusion.  Normal pulse oximetry at recent visits.  Patient continues to suffer with exercise intolerance.  Deconditioning due to limited activity and minimal activities outside of the home.  Encourage regular daily activity.  Encourage regular outings with family.  Orders Placed This Encounter  Procedures  . Care order/instruction:    Scheduling Instructions:     Perform mini mental status exam   Meds ordered this encounter  Medications  . albuterol (PROVENTIL) (2.5 MG/3ML) 0.083% nebulizer solution    Sig: Take 3 mLs (2.5 mg total) every 6 (six) hours as needed by nebulization for wheezing or shortness of breath. ICD10 OEVO-J50.909    Dispense:  75 mL    Refill:  5  . budesonide (PULMICORT) 0.25 MG/2ML nebulizer solution    Sig: Take 2 mLs (0.25 mg total) daily by nebulization. ICD10 KXFG-H82.909    Dispense:  60 mL    Refill:  12  . citalopram (CELEXA) 10 MG tablet    Sig: Take 1 tablet (10 mg total) daily by mouth.    Dispense:  90 tablet    Refill:  1  . albuterol (PROAIR HFA) 108 (90 Base) MCG/ACT inhaler    Sig: 2 PUFFS EVERY 6 HOURS AS NEEDED FOR SHORTNESS OF BREATH.    Dispense:  17 g    Refill:  5  . Zoster Vaccine Adjuvanted [facility]) injection    Sig: Inject 0.5 mLs once for 1 dose into the muscle.    Dispense:  0.5 mL    Refill:  1    Return in about 4 months (around 01/01/2018) for follow-up chronic medical conditions.   Mahealani Clements Elayne Guerin, M.D. Primary [facility] at [facility] [address] [place], [place]  [address] [phone] phone [phone] fax

## 2017-09-03 NOTE — Patient Instructions (Addendum)
   After completing the current prescription of Xarelto, you can stop it.    IF you received an x-ray today, you will receive an invoice from [facility]. Please contact [facility] at [phone] with questions or concerns regarding your invoice.   IF you received labwork today, you will receive an invoice from [place]. Please contact [facility] at [phone] with questions or concerns regarding your invoice.   Our billing staff will not be able to assist you with questions regarding bills from these companies.  You will be contacted with the lab results as soon as they are available. The fastest way to get your results is to activate your My Chart account. Instructions are located on the last page of this paperwork. If you have not heard from [place] regarding the results in 2 weeks, please contact this office.

## 2017-09-20 ENCOUNTER — Other Ambulatory Visit: Payer: Self-pay | Admitting: Family Medicine

## 2017-09-22 ENCOUNTER — Other Ambulatory Visit: Payer: Self-pay | Admitting: Family Medicine

## 2017-09-22 NOTE — Telephone Encounter (Signed)
Rx refill

## 2017-09-23 ENCOUNTER — Other Ambulatory Visit: Payer: Self-pay | Admitting: Family Medicine

## 2017-11-22 ENCOUNTER — Other Ambulatory Visit: Payer: Self-pay | Admitting: Family Medicine

## 2017-12-01 ENCOUNTER — Telehealth: Payer: Self-pay | Admitting: Family Medicine

## 2017-12-25 ENCOUNTER — Other Ambulatory Visit: Payer: Self-pay | Admitting: Family Medicine

## 2017-12-30 ENCOUNTER — Telehealth: Payer: Self-pay | Admitting: Family Medicine

## 2017-12-30 NOTE — Telephone Encounter (Signed)
°  Called pt- left VM - to remind them of their appt tomorrow. Advised them of the time, building number, early arrival and late policy. °

## 2017-12-31 ENCOUNTER — Other Ambulatory Visit: Payer: Self-pay

## 2017-12-31 ENCOUNTER — Ambulatory Visit (INDEPENDENT_AMBULATORY_CARE_PROVIDER_SITE_OTHER): Payer: Medicare Other | Admitting: Family Medicine

## 2017-12-31 ENCOUNTER — Encounter: Payer: Self-pay | Admitting: Family Medicine

## 2017-12-31 VITALS — BP 106/58 | HR 59 | Temp 97.4°F | Resp 16 | Ht 66.54 in | Wt 122.4 lb

## 2017-12-31 DIAGNOSIS — I10 Essential (primary) hypertension: Secondary | ICD-10-CM

## 2017-12-31 DIAGNOSIS — E119 Type 2 diabetes mellitus without complications: Secondary | ICD-10-CM

## 2017-12-31 DIAGNOSIS — F02818 Dementia in other diseases classified elsewhere, unspecified severity, with other behavioral disturbance: Secondary | ICD-10-CM

## 2017-12-31 DIAGNOSIS — J4540 Moderate persistent asthma, uncomplicated: Secondary | ICD-10-CM | POA: Diagnosis not present

## 2017-12-31 DIAGNOSIS — F0281 Dementia in other diseases classified elsewhere with behavioral disturbance: Secondary | ICD-10-CM

## 2017-12-31 DIAGNOSIS — R0609 Other forms of dyspnea: Secondary | ICD-10-CM

## 2017-12-31 DIAGNOSIS — G301 Alzheimer's disease with late onset: Secondary | ICD-10-CM | POA: Diagnosis not present

## 2017-12-31 NOTE — Patient Instructions (Addendum)
  STOP METOPROLOL (DUE TO LOW BLOOD PRESSURE AND HEART RATE).   IF you received an x-ray today, you will receive an invoice from [facility] Radiology. Please contact [facility] Radiology at [phone] with questions or concerns regarding your invoice.   IF you received labwork today, you will receive an invoice from [place]. Please contact [facility] at [phone] with questions or concerns regarding your invoice.   Our billing staff will not be able to assist you with questions regarding bills from these companies.  You will be contacted with the lab results as soon as they are available. The fastest way to get your results is to activate your My Chart account. Instructions are located on the last page of this paperwork. If you have not heard from [place] regarding the results in 2 weeks, please contact this office.

## 2017-12-31 NOTE — Progress Notes (Signed)
Subjective:    Patient ID: [name], female    DOB: 05/19/1931, 84 y.o.   MRN: [id number]  12/31/2017  Follow-up (Alzheimer's )    HPI This 84 y.o. female presents for four month follow-up Alzheimer's dementia, hypertension, DMII, asthma.  Management changes made at last visit include the following:  Late onset Alzheimer's disease with mild behavioral disturbance.  Tolerating addition of Namenda to Aricept therapy.  Consider increasing Namenda at next visit.  We will continue citalopram as well for irritability.  Status post physical therapy and home health evaluation to support family.  Husband present today and reporting that they will have medical assistant at home during the day with patient to assist with bathing and ADLs.  Patient no longer driving as appropriate with dementia.  Weight is stable which is reassuring. Refill of Xarelto provided.  1 refill completed, patient can discontinue.  She will have completed 6 months of Xarelto therapy for DVT in the right leg. Asthma improved control with nebulizer administered Pulmicort and albuterol.  Refills provided today.  Left pleural effusion stable without difficulties.  Recent chest x-ray showed stable small pleural effusion.  Normal pulse oximetry at recent visits. Patient continues to suffer with exercise intolerance.  Deconditioning due to limited activity and minimal activities outside of the home.  Encourage regular daily activity.  Encourage regular outings with family. Labs from last visit: -No evidence of anemia. -Sugar is normal. -Thyroid function is borderline yet you do not need to take thyroid medication due to minimal elevation in TSH. -Sugar is slightly elevated at 169 and Hemoglobin A1c is at goal at 7.0.  Continue current dose of medications.  Update from last visit: No exercise. Sleeps 16 hours per day. Vitamin D requesting check. Bedridden within 1.5 hours. Mood and behavior have improved.     MMSE - Mini  Mental State Exam 01/02/2018 09/03/2017 07/23/2017 04/18/2017  Orientation to time 2 1 2 5   Orientation to Place 4 5 5 5   Registration 3 3 3 1   Attention/ Calculation 5 1 0 0  Recall 0 1 0 1  Language- name 2 objects 2 2 2 2   Language- repeat 1 1 1 1   Language- follow 3 step command 3 2 3 3   Language- read & follow direction 1 1 1 1   Write a sentence 1 1 1 1   Copy design 1 0 0 1  Total score 23 18 18 21     BP Readings from Last 3 Encounters:  12/31/17 (!) 106/58  09/03/17 120/62  07/23/17 126/72   Wt Readings from Last 3 Encounters:  12/31/17 122 lb 6.4 oz (55.5 kg)  09/03/17 124 lb 9.6 oz (56.5 kg)  07/23/17 124 lb (56.2 kg)   Immunization History  Administered Date(s) Administered  . Influenza Split 08/21/2012, 08/22/2015  . Influenza,inj,Quad PF,6+ Mos 08/19/2016, 06/17/2017  . Influenza-Unspecified 08/21/2013  . Pneumococcal Conjugate-13 10/06/2014  . Pneumococcal Polysaccharide-23 08/21/2005, 07/23/2017  . Td 02/19/2004  . Zoster 06/21/2012   Grits with cheese 3 boosts per day. Sandwich for lunch. Ham and cheese or banana. [employer] vegetable, meat, dessert and last for 3 meals.   Review of Systems  Constitutional: Negative for chills, diaphoresis, fatigue and fever.  Eyes: Negative for visual disturbance.  Respiratory: Negative for cough and shortness of breath.   Cardiovascular: Negative for chest pain, palpitations and leg swelling.  Gastrointestinal: Negative for abdominal pain, constipation, diarrhea, nausea and vomiting.  Endocrine: Negative for cold intolerance, heat intolerance, polydipsia, polyphagia and polyuria.  Neurological: Negative for dizziness, tremors, seizures, syncope, facial asymmetry, speech difficulty, weakness, light-headedness, numbness and headaches.  Psychiatric/Behavioral: Positive for behavioral problems and confusion. Negative for dysphoric mood, hallucinations, self-injury, sleep disturbance and suicidal ideas. The patient is not  nervous/anxious and is not hyperactive.     Past Medical History:  Diagnosis Date  . Alzheimer's dementia without behavioral disturbance    diagnosed 12/2015; Dr. Jade/neurology  . Anemia   . Asthma   . Blood transfusion 05-24-12  . Blood transfusion without reported diagnosis   . Chronic kidney disease    dr Clover Mealy  . Diabetes mellitus   . Diverticulosis   . DVT (deep venous thrombosis) ([place]) 03/2005   hx.left leg  . Esophageal stricture   . GERD (gastroesophageal reflux disease)   . Glaucoma   . Hiatal hernia 05/07/13  . Hyperlipidemia   . Hypertension   . Osteoarthritis   . Pancreatic duct dilated 05/07/13  . Pulmonary nodules 05/07/13  . PVD (peripheral vascular disease) ([place])   . Tubular adenoma of colon    Past Surgical History:  Procedure Laterality Date  . CATARACT EXTRACTION, BILATERAL    . COLONOSCOPY  05/27/2012   Procedure: COLONOSCOPY;  Surgeon: Lafayette Dragon, MD;  Location: [facility];  Service: Endoscopy;  Laterality: N/A;  . ESOPHAGOGASTRODUODENOSCOPY  05/26/2012   Procedure: ESOPHAGOGASTRODUODENOSCOPY (EGD);  Surgeon: Lafayette Dragon, MD;  Location: [facility];  Service: Endoscopy;  Laterality: N/A;  . EUS N/A 07/08/2013   Procedure: UPPER ENDOSCOPIC ULTRASOUND (EUS) LINEAR;  Surgeon: Milus Banister, MD;  Location: [facility];  Service: Endoscopy;  Laterality: N/A;  need side view scope  . EYE SURGERY    . VASCULAR SURGERY  09/2004  . WRIST FRACTURE SURGERY     R wrist fracture, plate   No Known Allergies Current Outpatient Medications on File Prior to Visit  Medication Sig Dispense Refill  . albuterol (PROAIR HFA) 108 (90 Base) MCG/ACT inhaler 2 PUFFS EVERY 6 HOURS AS NEEDED FOR SHORTNESS OF BREATH. 17 g 5  . albuterol (PROVENTIL) (2.5 MG/3ML) 0.083% nebulizer solution Take 3 mLs (2.5 mg total) every 6 (six) hours as needed by nebulization for wheezing or shortness of breath. ICD10 code-J45.909 75 mL 5  . Ascorbic Acid (VITAMIN C) 1000 MG tablet Take  1,000 mg by mouth daily.    Marland Kitchen aspirin 81 MG tablet Take 81 mg by mouth daily.    . brimonidine (ALPHAGAN P) 0.1 % SOLN INSTILL 1 DROP IN LEFT EYE TWICE DAILY.    . budesonide (PULMICORT) 0.25 MG/2ML nebulizer solution Take 2 mLs (0.25 mg total) daily by nebulization. ICD10 Code-J45.909 60 mL 12  . calcium carbonate (OS-CAL) 600 MG TABS Take 600 mg by mouth 2 (two) times daily with a meal.    . citalopram (CELEXA) 10 MG tablet Take 1 tablet (10 mg total) daily by mouth. 90 tablet 1  . donepezil (ARICEPT) 10 MG tablet Take 1 tablet (10 mg total) by mouth at bedtime. 90 tablet 1  . IRON PO Take 1 day or 1 dose by mouth.    . memantine (NAMENDA) 5 MG tablet Take 1 tablet (5 mg total) by mouth 2 (two) times daily. 60 tablet 5  . metFORMIN (GLUCOPHAGE) 500 MG tablet TAKE 1 TABLET ONCE DAILY WITH SUPPER. 90 tablet 3  . Multiple Vitamin (MULTIVITAMIN WITH MINERALS) TABS tablet Take 1 tablet by mouth daily.     No current facility-administered medications on file prior to visit.    Social  History   Socioeconomic History  . Marital status: Married    Spouse name: Not on file  . Number of children: Not on file  . Years of education: Not on file  . Highest education level: Not on file  Occupational History  . Not on file  Social Needs  . Financial resource strain: Not on file  . Food insecurity:    Worry: Not on file    Inability: Not on file  . Transportation needs:    Medical: Not on file    Non-medical: Not on file  Tobacco Use  . Smoking status: Never Smoker  . Smokeless tobacco: Never Used  Substance and Sexual Activity  . Alcohol use: No  . Drug use: No  . Sexual activity: Not Currently  Lifestyle  . Physical activity:    Days per week: Not on file    Minutes per session: Not on file  . Stress: Not on file  Relationships  . Social connections:    Talks on phone: Not on file    Gets together: Not on file    Attends religious service: Not on file    Active member of club or  organization: Not on file    Attends meetings of clubs or organizations: Not on file    Relationship status: Not on file  . Intimate partner violence:    Fear of current or ex partner: Not on file    Emotionally abused: Not on file    Physically abused: Not on file    Forced sexual activity: Not on file  Other Topics Concern  . Not on file  Social History Narrative   Marital status: married x 60 years - Husband is Naome Brigandi, Brooke Bonito (former Teacher, English as a foreign language of [employer]) -- they have been together since she was in 6th grade!!      Children:  4 children; 11 grandchildren; 2 gg      Lives: with husband, dog      Employment: Retired      Tobacco: never      Alcohol: none      Exercise:  Walking daily short distances; previous tennis player      ADLs:  Walks with cane; cleans and cooks; NO DRIVING SINCE 4008.      Born: [place], [place]   Has 2 years of college.    Family History  Adopted: Yes  Problem Relation Age of Onset  . Migraines Daughter   . Cancer Son   . Colon cancer Neg Hx        Objective:    BP (!) 106/58   Pulse (!) 59   Temp (!) 97.4 F (36.3 C)   Resp 16   Ht 5' 6.54" (1.69 m)   Wt 122 lb 6.4 oz (55.5 kg)   BMI 19.44 kg/m  Physical Exam  Constitutional: She is oriented to person, place, and time. She appears well-developed and well-nourished. No distress.  HENT:  Head: Normocephalic and atraumatic.  Right Ear: External ear normal.  Left Ear: External ear normal.  Nose: Nose normal.  Mouth/Throat: Oropharynx is clear and moist.  Eyes: Conjunctivae and EOM are normal. Pupils are equal, round, and reactive to light.  Neck: Normal range of motion. Neck supple. Carotid bruit is not present. No thyromegaly present.  Cardiovascular: Normal rate, regular rhythm, normal heart sounds and intact distal pulses. Exam reveals no gallop and no friction rub.  No murmur heard. Pulmonary/Chest: Effort normal and breath sounds normal. She has no  wheezes. She has no rales.    Abdominal: Soft. Bowel sounds are normal. She exhibits no distension and no mass. There is no tenderness. There is no rebound and no guarding.  Lymphadenopathy:    She has no cervical adenopathy.  Neurological: She is alert and oriented to person, place, and time. No cranial nerve deficit.  Skin: Skin is warm and dry. No rash noted. She is not diaphoretic. No erythema. No pallor.  Psychiatric: She has a normal mood and affect. Her behavior is normal.   No results found. Depression screen [facility] 2/9 09/03/2017 06/17/2017 04/18/2017 09/18/2016 08/19/2016  Decreased Interest 0 0 0 0 0  Down, Depressed, Hopeless 0 0 0 0 0  PHQ - 2 Score 0 0 0 0 0   Fall Risk  09/03/2017 06/17/2017 04/18/2017 09/18/2016 08/13/2016  Falls in the past year? Yes Yes No No Yes  Number falls in past yr: 1 1 - - 1  Comment - - - - -  Injury with Fall? No - - - Yes  Comment - - - - Cracked rib, hurt knee        Assessment & Plan:   1. Late onset Alzheimer's disease with behavioral disturbance   2. HYPERTENSION, BENIGN SYSTEMIC   3. Type 2 diabetes mellitus without complication, without long-term current use of insulin ([place])   4. Moderate persistent asthma without complication   5. DOE (dyspnea on exertion)     Hypertension: with low blood pressure reading in office; stop Metoprolol. Alzheimer's dementia: stable with Aricept and Namenda therapies; good family support.   Asthma: stable on current regimen. DMII: stable; due to advanced age and dementia, HgbA1c goal < 8.0.  Orders Placed This Encounter  Procedures  . QuantiFERON-TB Gold Plus  . Hemoglobin A1c  . Comprehensive metabolic panel  . TSH  . CBC with Differential/Platelet  . VITAMIN D 25 Hydroxy (Vit-D Deficiency, Fractures)  . Care order/instruction:    Scheduling Instructions:     Perform mini mental status exam   No orders of the defined types were placed in this encounter.   Return in about 4 months (around 05/02/2018) for follow-up  chronic medical conditions.   [name] Elayne Guerin, M.D. Primary Care at [facility] previously [facility] [address] [place], [place]  [address] [phone] phone [phone] fax

## 2018-01-05 LAB — CBC WITH DIFFERENTIAL/PLATELET
BASOS: 0 %
Basophils Absolute: 0 10*3/uL (ref 0.0–0.2)
EOS (ABSOLUTE): 0.6 10*3/uL — ABNORMAL HIGH (ref 0.0–0.4)
EOS: 6 %
HEMATOCRIT: 40.8 % (ref 34.0–46.6)
Hemoglobin: 12.6 g/dL (ref 11.1–15.9)
IMMATURE GRANS (ABS): 0 10*3/uL (ref 0.0–0.1)
IMMATURE GRANULOCYTES: 0 %
LYMPHS: 11 %
Lymphocytes Absolute: 1 10*3/uL (ref 0.7–3.1)
MCH: 30.9 pg (ref 26.6–33.0)
MCHC: 30.9 g/dL — ABNORMAL LOW (ref 31.5–35.7)
MCV: 100 fL — AB (ref 79–97)
MONOS ABS: 0.7 10*3/uL (ref 0.1–0.9)
Monocytes: 8 %
NEUTROS ABS: 6.5 10*3/uL (ref 1.4–7.0)
NEUTROS PCT: 75 %
Platelets: 314 10*3/uL (ref 150–379)
RBC: 4.08 x10E6/uL (ref 3.77–5.28)
RDW: 13.8 % (ref 12.3–15.4)
WBC: 8.8 10*3/uL (ref 3.4–10.8)

## 2018-01-05 LAB — COMPREHENSIVE METABOLIC PANEL
ALBUMIN: 3.4 g/dL — AB (ref 3.5–4.7)
ALT: 12 IU/L (ref 0–32)
AST: 17 IU/L (ref 0–40)
Albumin/Globulin Ratio: 1.1 — ABNORMAL LOW (ref 1.2–2.2)
Alkaline Phosphatase: 68 IU/L (ref 39–117)
BUN / CREAT RATIO: 26 (ref 12–28)
BUN: 23 mg/dL (ref 8–27)
Bilirubin Total: 0.3 mg/dL (ref 0.0–1.2)
CALCIUM: 9.8 mg/dL (ref 8.7–10.3)
CO2: 27 mmol/L (ref 20–29)
Chloride: 98 mmol/L (ref 96–106)
Creatinine, Ser: 0.9 mg/dL (ref 0.57–1.00)
GFR, EST AFRICAN AMERICAN: 68 mL/min/{1.73_m2} (ref >59)
GFR, EST NON AFRICAN AMERICAN: 59 mL/min/{1.73_m2} — AB (ref >59)
Globulin, Total: 3.2 g/dL (ref 1.5–4.5)
Glucose: 266 mg/dL — ABNORMAL HIGH (ref 65–99)
POTASSIUM: 5 mmol/L (ref 3.5–5.2)
Sodium: 139 mmol/L (ref 134–144)
TOTAL PROTEIN: 6.6 g/dL (ref 6.0–8.5)

## 2018-01-05 LAB — QUANTIFERON-TB GOLD PLUS
QUANTIFERON TB1 AG VALUE: 0.02 [IU]/mL
QUANTIFERON TB2 AG VALUE: 0.02 [IU]/mL
QuantiFERON Mitogen Value: 1.42 IU/mL
QuantiFERON Nil Value: 0.02 IU/mL
QuantiFERON-TB Gold Plus: NEGATIVE

## 2018-01-05 LAB — HEMOGLOBIN A1C
ESTIMATED AVERAGE GLUCOSE: 157 mg/dL
Hgb A1c MFr Bld: 7.1 % — ABNORMAL HIGH (ref 4.8–5.6)

## 2018-01-05 LAB — VITAMIN D 25 HYDROXY (VIT D DEFICIENCY, FRACTURES): Vit D, 25-Hydroxy: 43.1 ng/mL (ref 30.0–100.0)

## 2018-01-05 LAB — TSH: TSH: 4.54 u[IU]/mL — ABNORMAL HIGH (ref 0.450–4.500)

## 2018-01-19 ENCOUNTER — Ambulatory Visit: Payer: Medicare Other | Admitting: Cardiology

## 2018-01-31 ENCOUNTER — Other Ambulatory Visit: Payer: Self-pay | Admitting: Family Medicine

## 2018-02-16 ENCOUNTER — Ambulatory Visit (INDEPENDENT_AMBULATORY_CARE_PROVIDER_SITE_OTHER): Payer: Medicare Other | Admitting: Cardiology

## 2018-02-16 ENCOUNTER — Encounter: Payer: Self-pay | Admitting: Cardiology

## 2018-02-16 VITALS — BP 138/78 | HR 67 | Ht 66.5 in | Wt 126.8 lb

## 2018-02-16 DIAGNOSIS — I447 Left bundle-branch block, unspecified: Secondary | ICD-10-CM

## 2018-02-16 DIAGNOSIS — I10 Essential (primary) hypertension: Secondary | ICD-10-CM

## 2018-02-16 DIAGNOSIS — H401122 Primary open-angle glaucoma, left eye, moderate stage: Secondary | ICD-10-CM | POA: Diagnosis not present

## 2018-02-16 DIAGNOSIS — I2510 Atherosclerotic heart disease of native coronary artery without angina pectoris: Secondary | ICD-10-CM

## 2018-02-16 DIAGNOSIS — I872 Venous insufficiency (chronic) (peripheral): Secondary | ICD-10-CM

## 2018-02-16 DIAGNOSIS — I70211 Atherosclerosis of native arteries of extremities with intermittent claudication, right leg: Secondary | ICD-10-CM

## 2018-02-16 NOTE — Progress Notes (Signed)
PCP: Wardell Honour, MD  Clinic Note: Chief Complaint  Patient presents with  . Follow-up    pt denies swelling in hands/feet, chest pains, SOB   Annual follow-up for some exertional dyspnea HPI: [name] is a 84 y.o. female with a PMH below who presents today for an possible heart failure symptoms in the past.. As a result of an abnormal chest CT, we check an echocardiogram and Myoview with results noted below.  Despite having some mild irregularities, we decided to treat medically. She is away from long-standing patient of mine [name]).. She is starting to get a little bit more difficult to manage with up and down days secondary to late onset Alzheimer's.  [name] was last seen on November 29, 2016 to discuss results of her Myoview and echocardiogram.  These did suggest some possible reduced EF of roughly 45 to 50% with inferior hypokinesis and some aortic sclerosis.  Stress test was read as "intermediate risk" because of a EF of 38%.   There is no evidence of ischemia or infarction noted  Recent Hospitalizations: None  Studies Personally Reviewed - (if available, images/films reviewed: From Epic Chart or Care Everywhere)  None since last visit  Interval History: [name] returns here today with her husband [name] really denying any symptoms.  She says she just feels lousy getting old.  She wants to get back into her garden.  Interestingly, her husband says that she spends most of the day (16 hours) in bed or sleeping.  She is gotten profoundly weaker and has been losing weight because she has not been eating very much. She denies any cardiac symptoms besides may be some mild edema.    No chest pain or shortness of breath with rest or exertion. No PND, orthopnea or edema. No palpitations, lightheadedness, dizziness, weakness or syncope/near syncope. No TIA/amaurosis fugax symptoms. No melena, hematochezia, hematuria, or epstaxis. No claudication.  ROS: A comprehensive was  performed. Review of Systems  Constitutional: Positive for malaise/fatigue (Sleeps a lot) and weight loss. Negative for chills and fever.  Respiratory: Positive for shortness of breath (Only if she really exerts).   Cardiovascular: Negative for claudication.  Genitourinary:       Occasional loss of bladder control  Musculoskeletal: Positive for back pain and joint pain. Negative for falls (No further falls).  Psychiatric/Behavioral: Positive for memory loss. The patient has insomnia (She may not sleep well at night).   All other systems reviewed and are negative.    I have reviewed and (if needed) personally updated the patient's problem list, medications, allergies, past medical and surgical history, social and family history.   Past Medical History:  Diagnosis Date  . Alzheimer's dementia without behavioral disturbance    diagnosed 12/2015; Dr. Jade/neurology  . Anemia   . Asthma   . Blood transfusion 05-24-12  . Blood transfusion without reported diagnosis   . Chronic kidney disease    dr Clover Mealy  . Diabetes mellitus   . Diverticulosis   . DVT (deep venous thrombosis) ([place]) 03/2005   hx.left leg  . Esophageal stricture   . GERD (gastroesophageal reflux disease)   . Glaucoma   . Hiatal hernia 05/07/13  . Hyperlipidemia   . Hypertension   . Osteoarthritis   . Pancreatic duct dilated 05/07/13  . Pulmonary nodules 05/07/13  . PVD (peripheral vascular disease) ([place])   . Tubular adenoma of colon     Past Surgical History:  Procedure Laterality Date  . CATARACT EXTRACTION, BILATERAL    .  COLONOSCOPY  05/27/2012   Procedure: COLONOSCOPY;  Surgeon: Lafayette Dragon, MD;  Location: [facility];  Service: Endoscopy;  Laterality: N/A;  . ESOPHAGOGASTRODUODENOSCOPY  05/26/2012   Procedure: ESOPHAGOGASTRODUODENOSCOPY (EGD);  Surgeon: Lafayette Dragon, MD;  Location: [facility];  Service: Endoscopy;  Laterality: N/A;  . EUS N/A 07/08/2013   Procedure: UPPER ENDOSCOPIC ULTRASOUND (EUS)  LINEAR;  Surgeon: Milus Banister, MD;  Location: [facility];  Service: Endoscopy;  Laterality: N/A;  need side view scope  . EYE SURGERY    . NM MYOVIEW LTD  10/2016   Read as intermediate risk due to EF of 38%.Anemia or infarction noted. Only abnormal septal WM.   Marland Kitchen TRANSTHORACIC ECHOCARDIOGRAM  10/2016    EF 45-50% with possible inferior hypokinesis. Aortic sclerosis without stenosis (ordered to reassess EF from Myoview)  . VASCULAR SURGERY  09/2004  . WRIST FRACTURE SURGERY     R wrist fracture, plate  Chest CT November 8: Moderate to advanced calcific atherosclerotic disease coronary arteries> aorta. A inflation of the lungs, suggest possible COPD. Moderate size left pleural effusion - consider thoracentesis Myoview January 2018: Read as intermediate risk due to EF of 38%.Anemia or infarction noted. No regional wall motion abnormality. This abnormal septal motion.  2-D Echocardiogram: EF 45-50% with possible inferior hypokinesis. Aortic sclerosis without stenosis    Current Meds  Medication Sig  . albuterol (PROAIR HFA) 108 (90 Base) MCG/ACT inhaler 2 PUFFS EVERY 6 HOURS AS NEEDED FOR SHORTNESS OF BREATH.  Marland Kitchen albuterol (PROVENTIL) (2.5 MG/3ML) 0.083% nebulizer solution Take 3 mLs (2.5 mg total) every 6 (six) hours as needed by nebulization for wheezing or shortness of breath. ICD10 code-J45.909  . Ascorbic Acid (VITAMIN C) 1000 MG tablet Take 1,000 mg by mouth daily.  Marland Kitchen aspirin 81 MG tablet Take 81 mg by mouth daily.  . brimonidine (ALPHAGAN P) 0.1 % SOLN INSTILL 1 DROP IN BOTH EYE TWICE DAILY.  . budesonide (PULMICORT) 0.25 MG/2ML nebulizer solution Take 2 mLs (0.25 mg total) daily by nebulization. ICD10 Code-J45.909  . calcium carbonate (OS-CAL) 600 MG TABS Take 600 mg by mouth 2 (two) times daily with a meal.  . citalopram (CELEXA) 10 MG tablet Take 1 tablet (10 mg total) daily by mouth.  . donepezil (ARICEPT) 10 MG tablet Take 1 tablet (10 mg total) by mouth at bedtime.  . IRON PO  Take 1 day or 1 dose by mouth.  . memantine (NAMENDA) 5 MG tablet TAKE 1 TABLET BY MOUTH TWICE DAILY.  . metFORMIN (GLUCOPHAGE) 500 MG tablet TAKE 1 TABLET ONCE DAILY WITH SUPPER.  . Multiple Vitamin (MULTIVITAMIN WITH MINERALS) TABS tablet Take 1 tablet by mouth daily.    No Known Allergies  Social History   Tobacco Use  . Smoking status: Never Smoker  . Smokeless tobacco: Never Used  Substance Use Topics  . Alcohol use: No  . Drug use: No   Social History   Social History Narrative   Marital status: married x 60 years - Husband is [name], [name] (former Teacher, English as a foreign language of [employer]) -- they have been together since she was in 6th grade!!      Children:  4 children; 11 grandchildren; 2 gg      Lives: with husband, dog      Employment: Retired      Tobacco: never      Alcohol: none      Exercise:  Walking daily short distances; previous tennis player      ADLs:  Walks with cane; cleans and cooks; NO DRIVING SINCE 6160.      Born: [place], [place]   Has 2 years of college.     family history includes Cancer in her son; Migraines in her daughter. She was adopted.  Wt Readings from Last 3 Encounters:  02/16/18 126 lb 12.8 oz (57.5 kg)  12/31/17 122 lb 6.4 oz (55.5 kg)  09/03/17 124 lb 9.6 oz (56.5 kg)    PHYSICAL EXAM BP 138/78 (BP Location: Left Arm)   Pulse 67   Ht 5' 6.5" (1.689 m)   Wt 126 lb 12.8 oz (57.5 kg)   BMI 20.16 kg/m  Physical Exam  Constitutional: She is oriented to person, place, and time. She appears well-developed and well-nourished. No distress.  Appears to be her stated age.  Well-groomed.  Somewhat thinner than before.  HENT:  Head: Normocephalic and atraumatic.  Neck: No hepatojugular reflux and no JVD present. Carotid bruit is not present.  Cardiovascular: Normal rate, regular rhythm, normal heart sounds and intact distal pulses.  Occasional extrasystoles are present. PMI is not displaced. Exam reveals no gallop and no friction rub.  No murmur  heard. Pulmonary/Chest: Effort normal and breath sounds normal. No respiratory distress. She has no wheezes. She exhibits no tenderness.  Abdominal: Soft. Bowel sounds are normal. She exhibits no distension. There is no tenderness.  Musculoskeletal: Normal range of motion. She exhibits no edema (Trivial).  Neurological: She is alert and oriented to person, place, and time.  Psychiatric: She has a normal mood and affect. Her behavior is normal. Thought content normal.     Adult ECG Report Not checked  Other studies Reviewed: Additional studies/ records that were reviewed today include:  Recent Labs:  PCP lab check June 2018.  None since then.    ASSESSMENT / PLAN: Problem List Items Addressed This Visit    Venous (peripheral) insufficiency (Chronic)    Relatively stable.  I still recommend support stockings and foot elevation.      LBBB (left bundle branch block) (Chronic)    This is probably why the EF appears down and there is septal wall motion on the stress test and echocardiogram.  With no active heart failure symptoms to suggest that she is symptomatic from this I would not make any adjustments. She is not having any ischemic symptoms.      HYPERTENSION, BENIGN SYSTEMIC (Chronic)    Blood pressure is borderline today on no medications.  I would again would err on the side of a mild permissive hypertension and would not be overly aggressive to avoid any orthostatic hypotension or syncope.      Coronary artery calcification seen on CAT scan - Primary (Chronic)    Essentially nonischemic Myoview couple years ago.  I think given her age and comorbidities her LDL is pretty much a target of 104.  Would not be much more aggressive.  She is already on aspirin, but not on statin  --   Partially because of memory issues).  She is also not on any antihypertensives.      Atherosclerosis of native arteries of extremity with intermittent claudication (HCC) (Chronic)    She does not do a  lot of walking around, but denies any claudication symptoms. Continue to walk when possible.          I spent a total of 25 minutes with the patient and chart review. >  50% of the time was spent in direct patient consultation.   Current medicines  are reviewed at length with the patient today.  (+/- concerns) n/a The following changes have been made:  n/a  Patient Instructions  No change with current medications   when you drink BOOST, fill up with water and drink it.   Your physician wants you to follow-up in 12 months with Dr Ellyn Hack. You will receive a reminder letter in the mail two months in advance. If you don't receive a letter, please call our office to schedule the follow-up appointment.    If you need a refill on your cardiac medications before your next appointment, please call your pharmacy.     Studies Ordered:   No orders of the defined types were placed in this encounter.     Glenetta Hew, M.D., M.S. Interventional Cardiologist   Pager # [phone] Phone # [phone] [address] [place], [place] [address]   Thank you for choosing [facility] at [facility]!!

## 2018-02-16 NOTE — Patient Instructions (Addendum)
No change with current medications   when you drink BOOST, fill up with water and drink it.   Your physician wants you to follow-up in 12 months with Dr Ellyn Hack. You will receive a reminder letter in the mail two months in advance. If you don't receive a letter, please call our office to schedule the follow-up appointment.    If you need a refill on your cardiac medications before your next appointment, please call your pharmacy.

## 2018-02-17 ENCOUNTER — Encounter: Payer: Self-pay | Admitting: Cardiology

## 2018-02-17 NOTE — Assessment & Plan Note (Signed)
This is probably why the EF appears down and there is septal wall motion on the stress test and echocardiogram.  With no active heart failure symptoms to suggest that she is symptomatic from this I would not make any adjustments. She is not having any ischemic symptoms.

## 2018-02-17 NOTE — Assessment & Plan Note (Signed)
Relatively stable.  I still recommend support stockings and foot elevation.

## 2018-02-17 NOTE — Assessment & Plan Note (Signed)
Essentially nonischemic Myoview couple years ago.  I think given her age and comorbidities her LDL is pretty much a target of 104.  Would not be much more aggressive.  She is already on aspirin, but not on statin  --   Partially because of memory issues).  She is also not on any antihypertensives.

## 2018-02-17 NOTE — Assessment & Plan Note (Signed)
She does not do a lot of walking around, but denies any claudication symptoms. Continue to walk when possible.

## 2018-02-17 NOTE — Assessment & Plan Note (Signed)
Blood pressure is borderline today on no medications.  I would again would err on the side of a mild permissive hypertension and would not be overly aggressive to avoid any orthostatic hypotension or syncope.

## 2018-02-26 ENCOUNTER — Other Ambulatory Visit: Payer: Self-pay | Admitting: Family Medicine

## 2018-02-26 NOTE — Telephone Encounter (Signed)
Albuterol 0.083% nebulizer solution refill Last OV: 12/31/17  Last Refill:09/03/17 75 ml 5 RF Pharmacy:[facility] [address] PCP: Reginia Forts MD

## 2018-03-05 ENCOUNTER — Other Ambulatory Visit: Payer: Self-pay | Admitting: Family Medicine

## 2018-03-18 ENCOUNTER — Encounter: Payer: Self-pay | Admitting: Family Medicine

## 2018-03-30 ENCOUNTER — Other Ambulatory Visit: Payer: Self-pay | Admitting: Family Medicine

## 2018-03-31 NOTE — Telephone Encounter (Signed)
Donepezil refill Last Refill:07/23/17 # 90 tab 1 RF Last OV: 12/21/17 PCP: Reginia Forts MD Pharmacy:[facility] Pt has upcoming appt 05/04/18.

## 2018-04-07 ENCOUNTER — Other Ambulatory Visit: Payer: Self-pay | Admitting: Family Medicine

## 2018-04-16 ENCOUNTER — Ambulatory Visit: Payer: Medicare Other | Admitting: Family Medicine

## 2018-05-01 ENCOUNTER — Encounter: Payer: Self-pay | Admitting: Family Medicine

## 2018-05-01 ENCOUNTER — Ambulatory Visit (INDEPENDENT_AMBULATORY_CARE_PROVIDER_SITE_OTHER): Payer: Medicare Other | Admitting: Family Medicine

## 2018-05-01 VITALS — BP 128/68 | HR 93 | Temp 98.0°F | Ht 66.5 in | Wt 120.2 lb

## 2018-05-01 DIAGNOSIS — G301 Alzheimer's disease with late onset: Secondary | ICD-10-CM | POA: Diagnosis not present

## 2018-05-01 DIAGNOSIS — K862 Cyst of pancreas: Secondary | ICD-10-CM | POA: Diagnosis not present

## 2018-05-01 DIAGNOSIS — R531 Weakness: Secondary | ICD-10-CM

## 2018-05-01 DIAGNOSIS — Z86718 Personal history of other venous thrombosis and embolism: Secondary | ICD-10-CM | POA: Diagnosis not present

## 2018-05-01 DIAGNOSIS — K219 Gastro-esophageal reflux disease without esophagitis: Secondary | ICD-10-CM

## 2018-05-01 DIAGNOSIS — I872 Venous insufficiency (chronic) (peripheral): Secondary | ICD-10-CM | POA: Diagnosis not present

## 2018-05-01 DIAGNOSIS — I10 Essential (primary) hypertension: Secondary | ICD-10-CM | POA: Diagnosis not present

## 2018-05-01 DIAGNOSIS — F0281 Dementia in other diseases classified elsewhere with behavioral disturbance: Secondary | ICD-10-CM

## 2018-05-01 DIAGNOSIS — E119 Type 2 diabetes mellitus without complications: Secondary | ICD-10-CM | POA: Diagnosis not present

## 2018-05-01 DIAGNOSIS — J90 Pleural effusion, not elsewhere classified: Secondary | ICD-10-CM

## 2018-05-01 DIAGNOSIS — J301 Allergic rhinitis due to pollen: Secondary | ICD-10-CM

## 2018-05-01 DIAGNOSIS — J4540 Moderate persistent asthma, uncomplicated: Secondary | ICD-10-CM

## 2018-05-01 DIAGNOSIS — R413 Other amnesia: Secondary | ICD-10-CM | POA: Diagnosis not present

## 2018-05-01 DIAGNOSIS — I70211 Atherosclerosis of native arteries of extremities with intermittent claudication, right leg: Secondary | ICD-10-CM

## 2018-05-01 DIAGNOSIS — D7589 Other specified diseases of blood and blood-forming organs: Secondary | ICD-10-CM | POA: Diagnosis not present

## 2018-05-01 LAB — COMPREHENSIVE METABOLIC PANEL
ALK PHOS: 54 U/L (ref 39–117)
ALT: 12 U/L (ref 0–35)
AST: 17 U/L (ref 0–37)
Albumin: 3.4 g/dL — ABNORMAL LOW (ref 3.5–5.2)
BILIRUBIN TOTAL: 0.4 mg/dL (ref 0.2–1.2)
BUN: 25 mg/dL — AB (ref 6–23)
CO2: 33 mEq/L — ABNORMAL HIGH (ref 19–32)
CREATININE: 0.96 mg/dL (ref 0.40–1.20)
Calcium: 10.1 mg/dL (ref 8.4–10.5)
Chloride: 98 mEq/L (ref 96–112)
GFR: 58.69 mL/min — ABNORMAL LOW (ref >60.00)
GLUCOSE: 179 mg/dL — AB (ref 70–99)
POTASSIUM: 4.4 meq/L (ref 3.5–5.1)
SODIUM: 137 meq/L (ref 135–145)
TOTAL PROTEIN: 7.2 g/dL (ref 6.0–8.3)

## 2018-05-01 LAB — LIPID PANEL
CHOL/HDL RATIO: 4
Cholesterol: 184 mg/dL (ref 0–200)
HDL: 48.7 mg/dL (ref >39.00)
LDL CALC: 106 mg/dL — AB (ref 0–99)
NONHDL: 135.63
TRIGLYCERIDES: 148 mg/dL (ref 0.0–149.0)
VLDL: 29.6 mg/dL (ref 0.0–40.0)

## 2018-05-01 LAB — POCT GLYCOSYLATED HEMOGLOBIN (HGB A1C): Hemoglobin A1C: 7.2 % — AB (ref 4.0–5.6)

## 2018-05-01 LAB — VITAMIN B12: Vitamin B-12: 1267 pg/mL — ABNORMAL HIGH (ref 211–911)

## 2018-05-01 LAB — CBC
HCT: 39.8 % (ref 36.0–46.0)
Hemoglobin: 13.3 g/dL (ref 12.0–15.0)
MCHC: 33.3 g/dL (ref 30.0–36.0)
MCV: 94.9 fl (ref 78.0–100.0)
Platelets: 367 10*3/uL (ref 150.0–400.0)
RBC: 4.2 Mil/uL (ref 3.87–5.11)
RDW: 12.6 % (ref 11.5–15.5)
WBC: 8.5 10*3/uL (ref 4.0–10.5)

## 2018-05-01 LAB — TSH: TSH: 3.89 u[IU]/mL (ref 0.35–4.50)

## 2018-05-01 LAB — LIPASE: Lipase: 7 U/L — ABNORMAL LOW (ref 11.0–59.0)

## 2018-05-01 LAB — AMYLASE: Amylase: 14 U/L — ABNORMAL LOW (ref 27–131)

## 2018-05-01 MED ORDER — DONEPEZIL HCL 10 MG PO TABS: 10.0000 mg | ORAL_TABLET | Freq: Every day | ORAL | 3 refills | Status: DC

## 2018-05-01 MED ORDER — AZELASTINE HCL 0.1 % NA SOLN: 1.0000 | Freq: Two times a day (BID) | NASAL | 11 refills | Status: DC

## 2018-05-01 MED ORDER — MEMANTINE HCL 5 MG PO TABS: 5.0000 mg | ORAL_TABLET | Freq: Two times a day (BID) | ORAL | 3 refills | Status: DC

## 2018-05-01 NOTE — Assessment & Plan Note (Signed)
S: Pulmicort twice a day. Has albuterol but not using regularly A/P: continue current rx

## 2018-05-01 NOTE — Patient Instructions (Addendum)
I would also like for you to sign up for an annual wellness visit with one of our nurses, Cassie or Manuela Schwartz, who both specialize in the annual wellness visit. This is a free benefit under medicare that may help [place] find additional ways to help you. Some highlights are reviewing medications, lifestyle, and doing a dementia screen.  Please stop by lab before you go  We are going to do an MRI to evaluate the memory loss. We will call you within two weeks about your referral. If you do not hear within 3 weeks, give [place] a call.   Try astelin to see if helps with runny nose- may stop if not working within 2 weeks  I will refer to home health for the generalized weakness.   May need to consider adjusting celexa up  Check back in after MRI

## 2018-05-01 NOTE — Assessment & Plan Note (Signed)
S: discussed results from CT abdomen 08/28/16 Interval cystic degeneration of the pancreas with coarse calcifications seen within the head of the pancreas. This may represent cystic replacement of the pancreatic parenchyma or alternatively chronic obstruction of the main pancreatic duct with diffuse dilation of the main pancreatic duct and its attributes. Please correlate to patient's serum pancreatic enzymes. A/P: family aware we could do further workup for this such as repeat imaging. They decline. amylase or lipase in future could be considered.

## 2018-05-01 NOTE — Assessment & Plan Note (Signed)
claritin doesn't help her runny nose. Trial astelin. Avoid flonase with glacoma

## 2018-05-01 NOTE — Assessment & Plan Note (Signed)
After vascular surgery right leg- 6 months later had blood clot in leg. Xarelto 6 months- now off

## 2018-05-01 NOTE — Assessment & Plan Note (Addendum)
S: remains On aricept 10mg  and namenda 5mg  twice a day. No neurologist involved. No longer driving since 0263 MMSE - [place] Exam 01/02/2018 09/03/2017 07/23/2017  Orientation to time 2 1 2   Orientation to Place 4 5 5   Registration 3 3 3   Attention/ Calculation 5 1 0  Recall 0 1 0  Language- name 2 objects 2 2 2   Language- repeat 1 1 1   Language- follow 3 step command 3 2 3   Language- read & follow direction 1 1 1   Write a sentence 1 1 1   Copy design 1 0 0  Total score 23 18 18   A/P: monitor this at least every 6-12 months. Continue current medicine.   Family brings notes over last year of progression of symptoms. They knew she had dementia prior to October 2017 but after a rib fracture she seemed to have steep drop off. Seems to have no strength or energy. Does not exercise at all. Sleeps 16 hours a day. Walks with walker now. Low motivation, anhedonia. Used to be talkative but now is not. Sits and watches tv. Not eating- losing weight. Wears a diaper and wets herself- sits in wet diaper for hours- seems incontinence is new over last year or two per family present today.   Does not appear ever had neuroimaging. We will get MRI brain to evaluate prior stroke or something like NPH with gait issues, incontinence. Will also get Cbc, cbmp, lipid, b12, tsh. Reassess after the above- may need to consider changing or uptitrating her celexa- might need to review prior EKG for this. This could be depression on top of alzheimer's or may just be sign of progression of her disease. Also will do PT to try to get her moving.

## 2018-05-01 NOTE — Progress Notes (Signed)
Phone: [phone]  Subjective:  Patient presents today to establish care.  Prior patient of Dr. Tamala Julian at [facility]. Chief complaint-noted.   See problem oriented charting  The following were reviewed and entered/updated in epic: Past Medical History:  Diagnosis Date  . Alzheimer's dementia without behavioral disturbance    diagnosed 12/2015; Dr. Jade/neurology  . Anemia   . Asthma   . Blood transfusion 05-24-12  . Blood transfusion without reported diagnosis   . Chronic kidney disease    dr Clover Mealy  . Diabetes mellitus   . Diverticulosis   . DVT (deep venous thrombosis) ([place]) 03/2005   hx.left leg  . Esophageal stricture   . GERD (gastroesophageal reflux disease)   . Glaucoma   . Hiatal hernia 05/07/13  . Hyperlipidemia   . Hypertension   . Osteoarthritis   . Pancreatic duct dilated 05/07/13  . Pulmonary nodules 05/07/13  . PVD (peripheral vascular disease) ([place])   . Tubular adenoma of colon    Patient Active Problem List   Diagnosis Date Noted  . History of DVT in adulthood 06/26/2017    Priority: High  . Coronary artery calcification seen on CAT scan 08/28/2016    Priority: High  . Alzheimer's disease 01/04/2016    Priority: High  . Diabetes ([place]) 12/18/2006    Priority: High  . Atherosclerosis of native arteries of extremity with intermittent claudication ([place]) 12/18/2006    Priority: High  . Generalized weakness 05/02/2018    Priority: Medium  . Pancreas cyst 07/02/2015    Priority: Medium  . Asthma, chronic 03/16/2015    Priority: Medium  . Primary open angle glaucoma of left eye, moderate stage 12/25/2014    Priority: Medium  . HYPERTENSION, BENIGN SYSTEMIC 12/18/2006    Priority: Medium  . GERD (gastroesophageal reflux disease) 05/02/2018    Priority: Low  . LBBB (left bundle branch block) 10/09/2016    Priority: Low  . Pleural effusion, left 09/23/2016    Priority: Low  . Pseudophakia of both eyes 02/15/2013    Priority: Low  . Anemia 05/25/2012   Priority: Low  . Astigmatism 04/29/2012    Priority: Low  . Allergic rhinitis 10/30/2010    Priority: Low  . Venous (peripheral) insufficiency 04/13/2007    Priority: Low  . OSTEOARTHRITIS OF SPINE, NOS 12/18/2006    Priority: Low   Past Surgical History:  Procedure Laterality Date  . CATARACT EXTRACTION, BILATERAL    . COLONOSCOPY  05/27/2012   Procedure: COLONOSCOPY;  Surgeon: Lafayette Dragon, MD;  Location: [facility];  Service: Endoscopy;  Laterality: N/A;  . ESOPHAGOGASTRODUODENOSCOPY  05/26/2012   Procedure: ESOPHAGOGASTRODUODENOSCOPY (EGD);  Surgeon: Lafayette Dragon, MD;  Location: [facility];  Service: Endoscopy;  Laterality: N/A;  . EUS N/A 07/08/2013   Procedure: UPPER ENDOSCOPIC ULTRASOUND (EUS) LINEAR;  Surgeon: Milus Banister, MD;  Location: [facility];  Service: Endoscopy;  Laterality: N/A;  need side view scope  . EYE SURGERY    . NM MYOVIEW LTD  10/2016   Read as intermediate risk due to EF of 38%.Anemia or infarction noted. Only abnormal septal WM.   Marland Kitchen TRANSTHORACIC ECHOCARDIOGRAM  10/2016    EF 45-50% with possible inferior hypokinesis. Aortic sclerosis without stenosis (ordered to reassess EF from Myoview)  . VASCULAR SURGERY  09/2004  . WRIST FRACTURE SURGERY     R wrist fracture, plate    Family History  Adopted: Yes  Problem Relation Age of Onset  . Migraines Daughter   . Cancer Son   .  Colon cancer Neg Hx     Medications- reviewed and updated Current Outpatient Medications  Medication Sig Dispense Refill  . famotidine (PEPCID) 20 MG tablet Take 1 tablet by mouth at bedtime.    Marland Kitchen albuterol (PROAIR HFA) 108 (90 Base) MCG/ACT inhaler 2 PUFFS EVERY 6 HOURS AS NEEDED FOR SHORTNESS OF BREATH. 17 g 5  . albuterol (PROVENTIL) (2.5 MG/3ML) 0.083% nebulizer solution USE THREE MILLILITERS VIA NEBULIZATION BY MOUTH EVERY 6 HOURS AS NEEDED FOR WHEEZING OR SHORTNESS OF BREATH 75 mL 4  . Ascorbic Acid (VITAMIN C) 1000 MG tablet Take 1,000 mg by mouth daily.    Marland Kitchen  aspirin 81 MG tablet Take 81 mg by mouth daily.    Marland Kitchen azelastine (ASTELIN) 0.1 % nasal spray Place 1 spray into both nostrils 2 (two) times daily. Use in each nostril as directed 30 mL 11  . brimonidine (ALPHAGAN P) 0.1 % SOLN INSTILL 1 DROP IN BOTH EYE TWICE DAILY.    . budesonide (PULMICORT) 0.25 MG/2ML nebulizer solution Take 2 mLs (0.25 mg total) daily by nebulization. ICD10 Code-J45.909 60 mL 12  . calcium carbonate (OS-CAL) 600 MG TABS Take 600 mg by mouth 2 (two) times daily with a meal.    . citalopram (CELEXA) 10 MG tablet Take 1 tablet (10 mg total) daily by mouth. 90 tablet 1  . donepezil (ARICEPT) 10 MG tablet Take 1 tablet (10 mg total) by mouth at bedtime. 90 tablet 3  . IRON PO Take 1 day or 1 dose by mouth.    . memantine (NAMENDA) 5 MG tablet Take 1 tablet (5 mg total) by mouth 2 (two) times daily. 180 tablet 3  . metFORMIN (GLUCOPHAGE) 500 MG tablet TAKE 1 TABLET ONCE DAILY WITH SUPPER. 90 tablet 3  . Multiple Vitamin (MULTIVITAMIN WITH MINERALS) TABS tablet Take 1 tablet by mouth daily.     No current facility-administered medications for this visit.     Allergies-reviewed and updated No Known Allergies  Social History   Social History Narrative   Marital status: married x 30 years - Husband is Lilleigh Hechavarria, Brooke Bonito (former Teacher, English as a foreign language of [employer]) -- they have been together since she was in 6th grade!!      Children:  4 children; 11 grandchildren; 2 gg      Lives: with husband, dog      Employment: Retired      Tobacco: never      Alcohol: none      Exercise:  Walking daily short distances; previous tennis player      ADLs:  Walks with cane; cleans and cooks; NO DRIVING SINCE [id number].      Born: [place], [place]   Has 2 years of college.     ROS--Full ROS was completed Review of Systems  Constitutional: Negative for chills and fever.  HENT: Positive for congestion. Negative for sinus pain.        Rhinorrhea  Eyes: Negative for pain and discharge.  Respiratory: Negative  for hemoptysis and wheezing.   Cardiovascular: Negative for chest pain and palpitations.  Gastrointestinal: Negative for abdominal pain and vomiting.  Genitourinary: Negative for dysuria and hematuria.  Musculoskeletal: Negative for falls (none recently) and myalgias.  Skin: Negative for itching and rash.  Neurological: Positive for weakness. Negative for seizures.  Endo/Heme/Allergies: Negative for environmental allergies and polydipsia.  Psychiatric/Behavioral: Negative for hallucinations and substance abuse.   Objective: BP 128/68 (BP Location: Left Arm, Patient Position: Sitting, Cuff Size: Normal)   Pulse 93  Temp 98 F (36.7 C) (Oral)   Ht 5' 6.5" (1.689 m)   Wt 120 lb 3.2 oz (54.5 kg)   SpO2 95%   BMI 19.11 kg/m  Gen: NAD, resting comfortably HEENT: Mucous membranes are moist. Oropharynx normal. TM normal. Eyes: sclera and lids normal, PERRLA Neck: no thyromegaly, no cervical lymphadenopathy CV: RRR no murmurs rubs or gallops Lungs: CTAB no crackles, wheeze, rhonchi Abdomen: soft/nontender/nondistended/normal bowel sounds. No rebound or guarding.  Ext: no edema Skin: warm, dry Neuro: in wheelchair through visit- able to stand but does not appear very stable  Assessment/Plan:  Diabetes ([place]) S: well controlled on metformin 500mg  daily Lab Results  Component Value Date   HGBA1C 7.1 (H) 12/31/2017   HGBA1C 7.0 07/23/2017   HGBA1C 6.5 (H) 04/18/2017  A/P: update a1c- reasonable to have goal of 8    Pancreas cyst S: discussed results from CT abdomen 08/28/16 Interval cystic degeneration of the pancreas with coarse calcifications seen within the head of the pancreas. This may represent cystic replacement of the pancreatic parenchyma or alternatively chronic obstruction of the main pancreatic duct with diffuse dilation of the main pancreatic duct and its attributes. Please correlate to patient's serum pancreatic enzymes. A/P: family aware we could do further  workup for this such as repeat imaging. They decline. amylase or lipase in future could be considered.    HYPERTENSION, BENIGN SYSTEMIC S: controlled on no rx. Off all BP meds- used to be on metoprolol BP Readings from Last 3 Encounters:  05/01/18 128/68  02/16/18 138/78  12/31/17 (!) 106/58  A/P: We discussed blood pressure goal of <140/90. Continue without meds    Asthma, chronic S: Pulmicort twice a day. Has albuterol but not using regularly A/P: continue current rx  Alzheimer's disease S: remains On aricept 10mg  and namenda 5mg  twice a day. No neurologist involved. No longer driving since 9678 MMSE - [place] Exam 01/02/2018 09/03/2017 07/23/2017  Orientation to time 2 1 2   Orientation to Place 4 5 5   Registration 3 3 3   Attention/ Calculation 5 1 0  Recall 0 1 0  Language- name 2 objects 2 2 2   Language- repeat 1 1 1   Language- follow 3 step command 3 2 3   Language- read & follow direction 1 1 1   Write a sentence 1 1 1   Copy design 1 0 0  Total score 23 18 18   A/P: monitor this at least every 6-12 months. Continue current medicine.   Family brings notes over last year of progression of symptoms. They knew she had dementia prior to October 2017 but after a rib fracture she seemed to have steep drop off. Seems to have no strength or energy. Does not exercise at all. Sleeps 16 hours a day. Walks with walker now. Low motivation, anhedonia. Used to be talkative but now is not. Sits and watches tv. Not eating- losing weight. Wears a diaper and wets herself- sits in wet diaper for hours- seems incontinence is new over last year or two per family present today.   Does not appear ever had neuroimaging. We will get MRI brain to evaluate prior stroke or something like NPH with gait issues, incontinence. Will also get Cbc, cbmp, lipid, b12, tsh. Reassess after the above- may need to consider changing or uptitrating her celexa- might need to review prior EKG for this. This could be  depression on top of alzheimer's or may just be sign of progression of her disease. Also will do PT  to try to get her moving.   Allergic rhinitis claritin doesn't help her runny nose. Trial astelin. Avoid flonase with glacoma  History of DVT in adulthood After vascular surgery right leg- 6 months later had blood clot in leg. Xarelto 6 months- now off  GERD (gastroesophageal reflux disease) GERD- controlled with pepcid with bigger meals  Generalized weakness S: Family has noted more and more issues with mobility. Patient does not want to have assist for most part and is resistant to help but she needs it due to generalized weakness. Seems to have worsened in recent months. Walks with a rollator walker.   She gets help fromStacy [facility] 9 AM-1 PM. Family asking if she needs more help or even about alternate living arrangements A/P: We will do a referral to home health for PT. I would also love if a Education officer, museum from there could discuss options with patient about getting extra assistance at home or alternate living arrangements potentially. The demands are creating quite the caregiver burden on the husband.    Future Appointments  Date Time Provider [place]  07/02/2018  2:30 PM Marin Olp, MD [facility]   Lab/Order associations: Venous (peripheral) insufficiency  Type 2 diabetes mellitus without complication, without long-term current use of insulin (HCC) - Plan: POCT glycosylated hemoglobin (Hb A1C), CBC, Comprehensive metabolic panel, Lipid panel  Memory loss - Plan: TSH, Vitamin B12, MR Brain W Wo Contrast  Generalized weakness - Plan: Ambulatory referral to [facility] ordered this encounter  Medications  . azelastine (ASTELIN) 0.1 % nasal spray    Sig: Place 1 spray into both nostrils 2 (two) times daily. Use in each nostril as directed    Dispense:  30 mL    Refill:  11  . donepezil (ARICEPT) 10 MG tablet    Sig: Take 1 tablet (10 mg total) by mouth at  bedtime.    Dispense:  90 tablet    Refill:  3  . memantine (NAMENDA) 5 MG tablet    Sig: Take 1 tablet (5 mg total) by mouth 2 (two) times daily.    Dispense:  180 tablet    Refill:  3   Please evaluate Harolyn Cocker for admission to [facility].  Disciplines requested: Physical Therapy, Medical Social Work and [place] to provide: Strengthening Exercises and Evaluate  Physician to follow patient's care (the person listed here will be responsible for signing ongoing orders): PCP  Requested Start of Care Date: Next Week  I certify that this patient is under my care and that I, or a Nurse Practitioner or Physician's Assistant working with me, had a face-to-face encounter that meets the physician face-to-face requirements with patient on 05/02/18. The encounter with the patient was in whole, or in part for the following medical condition(s) which is the primary reason for home health care (List medical condition). Generalized weakness, alzheimer's dementia  Return precautions advised. Garret Reddish, MD

## 2018-05-01 NOTE — Assessment & Plan Note (Signed)
S: well controlled on metformin 500mg  daily Lab Results  Component Value Date   HGBA1C 7.1 (H) 12/31/2017   HGBA1C 7.0 07/23/2017   HGBA1C 6.5 (H) 04/18/2017  A/P: update a1c- reasonable to have goal of 8

## 2018-05-01 NOTE — Assessment & Plan Note (Signed)
S: controlled on no rx. Off all BP meds- used to be on metoprolol BP Readings from Last 3 Encounters:  05/01/18 128/68  02/16/18 138/78  12/31/17 (!) 106/58  A/P: We discussed blood pressure goal of <140/90. Continue without meds

## 2018-05-02 ENCOUNTER — Encounter: Payer: Self-pay | Admitting: Family Medicine

## 2018-05-02 DIAGNOSIS — R531 Weakness: Secondary | ICD-10-CM | POA: Insufficient documentation

## 2018-05-02 DIAGNOSIS — K219 Gastro-esophageal reflux disease without esophagitis: Secondary | ICD-10-CM | POA: Insufficient documentation

## 2018-05-02 NOTE — Assessment & Plan Note (Signed)
S: Family has noted more and more issues with mobility. Patient does not want to have assist for most part and is resistant to help but she needs it due to generalized weakness. Seems to have worsened in recent months. Walks with a rollator walker.   She gets help fromStacy [facility]-w 9 AM-1 PM. Family asking if she needs more help or even about alternate living arrangements A/P: We will do a referral to home health for PT. I would also love if a Education officer, museum from there could discuss options with patient about getting extra assistance at home or alternate living arrangements potentially. The demands are creating quite the caregiver burden on the husband.

## 2018-05-02 NOTE — Assessment & Plan Note (Signed)
GERD- controlled with pepcid with bigger meals

## 2018-05-04 ENCOUNTER — Ambulatory Visit: Payer: Self-pay | Admitting: Family Medicine

## 2018-05-08 DIAGNOSIS — E1151 Type 2 diabetes mellitus with diabetic peripheral angiopathy without gangrene: Secondary | ICD-10-CM | POA: Diagnosis not present

## 2018-05-08 DIAGNOSIS — I10 Essential (primary) hypertension: Secondary | ICD-10-CM | POA: Diagnosis not present

## 2018-05-08 DIAGNOSIS — F0280 Dementia in other diseases classified elsewhere without behavioral disturbance: Secondary | ICD-10-CM | POA: Diagnosis not present

## 2018-05-08 DIAGNOSIS — J4540 Moderate persistent asthma, uncomplicated: Secondary | ICD-10-CM | POA: Diagnosis not present

## 2018-05-08 DIAGNOSIS — G301 Alzheimer's disease with late onset: Secondary | ICD-10-CM | POA: Diagnosis not present

## 2018-05-08 DIAGNOSIS — M6281 Muscle weakness (generalized): Secondary | ICD-10-CM | POA: Diagnosis not present

## 2018-05-12 DIAGNOSIS — E1151 Type 2 diabetes mellitus with diabetic peripheral angiopathy without gangrene: Secondary | ICD-10-CM | POA: Diagnosis not present

## 2018-05-12 DIAGNOSIS — M6281 Muscle weakness (generalized): Secondary | ICD-10-CM | POA: Diagnosis not present

## 2018-05-12 DIAGNOSIS — J4540 Moderate persistent asthma, uncomplicated: Secondary | ICD-10-CM | POA: Diagnosis not present

## 2018-05-12 DIAGNOSIS — G301 Alzheimer's disease with late onset: Secondary | ICD-10-CM | POA: Diagnosis not present

## 2018-05-12 DIAGNOSIS — F0280 Dementia in other diseases classified elsewhere without behavioral disturbance: Secondary | ICD-10-CM | POA: Diagnosis not present

## 2018-05-12 DIAGNOSIS — I10 Essential (primary) hypertension: Secondary | ICD-10-CM | POA: Diagnosis not present

## 2018-05-13 ENCOUNTER — Telehealth: Payer: Self-pay

## 2018-05-13 DIAGNOSIS — E1151 Type 2 diabetes mellitus with diabetic peripheral angiopathy without gangrene: Secondary | ICD-10-CM | POA: Diagnosis not present

## 2018-05-13 DIAGNOSIS — J4540 Moderate persistent asthma, uncomplicated: Secondary | ICD-10-CM | POA: Diagnosis not present

## 2018-05-13 DIAGNOSIS — F0280 Dementia in other diseases classified elsewhere without behavioral disturbance: Secondary | ICD-10-CM | POA: Diagnosis not present

## 2018-05-13 DIAGNOSIS — I10 Essential (primary) hypertension: Secondary | ICD-10-CM | POA: Diagnosis not present

## 2018-05-13 DIAGNOSIS — M6281 Muscle weakness (generalized): Secondary | ICD-10-CM | POA: Diagnosis not present

## 2018-05-13 DIAGNOSIS — G301 Alzheimer's disease with late onset: Secondary | ICD-10-CM | POA: Diagnosis not present

## 2018-05-13 NOTE — Telephone Encounter (Signed)
Called and provided verbal order as requested 

## 2018-05-13 NOTE — Telephone Encounter (Signed)
Laurey Arrow - Physical Therapist with [facility] called to report pt. Has a pressure wound to left heel. Would like an order for a nurse visit to assess wound. Call office with orders  - [phone].

## 2018-05-14 ENCOUNTER — Encounter: Payer: Self-pay | Admitting: Family Medicine

## 2018-05-14 ENCOUNTER — Ambulatory Visit (INDEPENDENT_AMBULATORY_CARE_PROVIDER_SITE_OTHER): Payer: Medicare Other | Admitting: Family Medicine

## 2018-05-14 VITALS — BP 136/86 | HR 92 | Temp 97.8°F | Ht 66.5 in | Wt 124.2 lb

## 2018-05-14 DIAGNOSIS — L8941 Pressure ulcer of contiguous site of back, buttock and hip, stage 1: Secondary | ICD-10-CM

## 2018-05-14 DIAGNOSIS — L97422 Non-pressure chronic ulcer of left heel and midfoot with fat layer exposed: Secondary | ICD-10-CM | POA: Diagnosis not present

## 2018-05-14 DIAGNOSIS — I70211 Atherosclerosis of native arteries of extremities with intermittent claudication, right leg: Secondary | ICD-10-CM | POA: Diagnosis not present

## 2018-05-14 DIAGNOSIS — M810 Age-related osteoporosis without current pathological fracture: Secondary | ICD-10-CM

## 2018-05-14 DIAGNOSIS — E119 Type 2 diabetes mellitus without complications: Secondary | ICD-10-CM | POA: Diagnosis not present

## 2018-05-14 DIAGNOSIS — E11621 Type 2 diabetes mellitus with foot ulcer: Secondary | ICD-10-CM | POA: Diagnosis not present

## 2018-05-14 DIAGNOSIS — L89622 Pressure ulcer of left heel, stage 2: Secondary | ICD-10-CM | POA: Diagnosis not present

## 2018-05-14 DIAGNOSIS — L97502 Non-pressure chronic ulcer of other part of unspecified foot with fat layer exposed: Secondary | ICD-10-CM

## 2018-05-14 NOTE — Patient Instructions (Addendum)
Health Maintenance Due  Topic Date Due  . DEXA SCAN - Schedule your bone density test at check out desk. You may also call directly to X-ray at [phone] to schedule an appointment that is convenient for you.  - located [address] across the street from [place] - in the basement - you do need an appointment for the bone density tests.  03/23/1998  . TETANUS/TDAP -Informed patient to have it done at her pharmacy. 02/18/2014  . OPHTHALMOLOGY EXAM - Patient will schedule an appointment with her eye doctor. 05/29/2017  . I would also like for you to sign up for an annual wellness visit with one of our nurses, Cassie or Manuela Schwartz, who both specialize in the annual wellness visit. This is a free benefit under medicare that may help [place] find additional ways to help you. Some highlights are reviewing medications, lifestyle, and doing a dementia screen.    . Please check with your pharmacy to see if they have the shingrix vaccine. If they do- please get this immunization and update [place] by phone call or mychart with dates you receive the vaccine   . URINE MICROALBUMIN - can you give a urine before you leave? 04/18/2018   Stage II pressure ulcer left heel as well as Stage I sacral decubitus ulcer- we are sending home health out to evaluate and treat- they will send [place] orders for materials/supplies if needed and we will sign for those  I think the biggest thing is to keep pressure off the heels- would consider wedges for the bed to help you sleep on your side and could be changed halfway through the night potentially. Need to get up from seated position at least every 20-30 minutes during the day- prolonged sitting not helping  Home health services to come out hopefully in next few days. Keep wound dry- so change dressing perhaps twice a day until that time.   im out of the office next week but if this is getting worse can see one of my colleagues- could go ahead and schedule in 2 weeks to see me  to check in on areas

## 2018-05-14 NOTE — Assessment & Plan Note (Signed)
S: Patient had a Education officer, museum come out to the house to do an evaluation. Apparently they mentioned the patient's heels were red and with calluses. Patient lays in bed or sits in chair majority of the day. Also has some redness in sacral decubitus area. Family not sure how long these areas have been there but certainly over a week.   A/P: Stage II ulcer left heel in diabetic- high risk wound- will refer to Dr. Sharol Given as he is our local expert on diabetic wounds. Will get his opinion on if any debridement needed.    I am going to refer to home health to see if they can help patient with positioning, cushioning to offload heels as well as sacral area.  For today- we recovered the ulcer with dry dressing as it was somewhat moist and encouraged BID changes. Also strongly advised wedge pillow to keep her on her side and hopefully offload sacrum and heels.

## 2018-05-14 NOTE — Progress Notes (Signed)
Subjective:  [name] is a 84 y.o. year old very pleasant female patient who presents for/with See problem oriented charting ROS- continued memory issues. Some left heel pain and discharge.    Past Medical History-  Dementia, diabetes, generalized weakness  Medications- reviewed and updated Current Outpatient Medications  Medication Sig Dispense Refill  . albuterol (PROAIR HFA) 108 (90 Base) MCG/ACT inhaler 2 PUFFS EVERY 6 HOURS AS NEEDED FOR SHORTNESS OF BREATH. 17 g 5  . albuterol (PROVENTIL) (2.5 MG/3ML) 0.083% nebulizer solution USE THREE MILLILITERS VIA NEBULIZATION BY MOUTH EVERY 6 HOURS AS NEEDED FOR WHEEZING OR SHORTNESS OF BREATH 75 mL 4  . Ascorbic Acid (VITAMIN C) 1000 MG tablet Take 1,000 mg by mouth daily.    Marland Kitchen aspirin 81 MG tablet Take 81 mg by mouth daily.    Marland Kitchen azelastine (ASTELIN) 0.1 % nasal spray Place 1 spray into both nostrils 2 (two) times daily. Use in each nostril as directed 30 mL 11  . brimonidine (ALPHAGAN P) 0.1 % SOLN INSTILL 1 DROP IN BOTH EYE TWICE DAILY.    . budesonide (PULMICORT) 0.25 MG/2ML nebulizer solution Take 2 mLs (0.25 mg total) daily by nebulization. ICD10 Code-J45.909 60 mL 12  . calcium carbonate (OS-CAL) 600 MG TABS Take 600 mg by mouth 2 (two) times daily with a meal.    . citalopram (CELEXA) 10 MG tablet Take 1 tablet (10 mg total) daily by mouth. 90 tablet 1  . donepezil (ARICEPT) 10 MG tablet Take 1 tablet (10 mg total) by mouth at bedtime. 90 tablet 3  . famotidine (PEPCID) 20 MG tablet Take 1 tablet by mouth at bedtime.    . IRON PO Take 1 day or 1 dose by mouth.    . memantine (NAMENDA) 5 MG tablet Take 1 tablet (5 mg total) by mouth 2 (two) times daily. 180 tablet 3  . metFORMIN (GLUCOPHAGE) 500 MG tablet TAKE 1 TABLET ONCE DAILY WITH SUPPER. 90 tablet 3  . Multiple Vitamin (MULTIVITAMIN WITH MINERALS) TABS tablet Take 1 tablet by mouth daily.     No current facility-administered medications for this visit.     Objective: BP 136/86  (BP Location: Left Arm, Patient Position: Sitting, Cuff Size: Normal)   Pulse 92   Temp 97.8 F (36.6 C) (Oral)   Ht 5' 6.5" (1.689 m)   Wt 124 lb 3.2 oz (56.3 kg)   SpO2 94%   BMI 19.75 kg/m  Gen: NAD, resting comfortably CV: RRR  Lungs: nonlabored, normal respiratory rate Abdomen: soft/nondistended Ext: no edema Skin: warm, dry Blanching redness through most of sacral decubitus area. One patch at top of buttocks nonblanching.  On right heel there is a quarter size callous- when removed just dry skin underneath. Not tender. On the left heel there is a 3 cm callous that when removed there is a stage II ulceration underneath. Serosanguinous drainage noted Neuro: walks with wheelchair  Assessment/Plan:  Diabetic ulcer of foot with fat layer exposed ([place]) S: Patient had a Education officer, museum come out to the house to do an evaluation. Apparently they mentioned the patient's heels were red and with calluses. Patient lays in bed or sits in chair majority of the day. Also has some redness in sacral decubitus area. Family not sure how long these areas have been there but certainly over a week.   A/P: Stage II ulcer left heel in diabetic- high risk wound- will refer to Dr. Sharol Given as he is our local expert on diabetic wounds. Will  get his opinion on if any debridement needed.    I am going to refer to home health to see if they can help patient with positioning, cushioning to offload heels as well as sacral area.  For today- we recovered the ulcer with dry dressing as it was somewhat moist and encouraged BID changes. Also strongly advised wedge pillow to keep her on her side and hopefully offload sacrum and heels.     Future Appointments  Date Time Provider [place]  05/19/2018  1:20 PM GI-315 MR 2 GI-315MRI GI-315 [address] [facility]  07/02/2018  2:30 PM Yong Channel Brayton Mars, MD [facility]   Also see AVS  Lab/Order associations: Type 2 diabetes mellitus without complication, without long-term current  use of insulin ([place]) - Plan: Microalbumin / creatinine urine ratio, Ambulatory referral to Orthopedics, CANCELED: Microalbumin / creatinine urine ratio  Pressure injury of left heel, stage 2 - Plan: Ambulatory referral to Orthopedics  Pressure injury of contiguous region involving buttock and hip, stage 1, unspecified laterality  Osteoporosis, unspecified osteoporosis type, unspecified pathological fracture presence - Plan: DG Bone Density  Diabetic ulcer of left heel associated with type 2 diabetes mellitus, with fat layer exposed ([place])  Return precautions advised.  Garret Reddish, MD

## 2018-05-15 ENCOUNTER — Telehealth: Payer: Self-pay | Admitting: Family Medicine

## 2018-05-15 DIAGNOSIS — F0280 Dementia in other diseases classified elsewhere without behavioral disturbance: Secondary | ICD-10-CM | POA: Diagnosis not present

## 2018-05-15 DIAGNOSIS — M6281 Muscle weakness (generalized): Secondary | ICD-10-CM | POA: Diagnosis not present

## 2018-05-15 DIAGNOSIS — J4540 Moderate persistent asthma, uncomplicated: Secondary | ICD-10-CM | POA: Diagnosis not present

## 2018-05-15 DIAGNOSIS — G301 Alzheimer's disease with late onset: Secondary | ICD-10-CM | POA: Diagnosis not present

## 2018-05-15 DIAGNOSIS — E1151 Type 2 diabetes mellitus with diabetic peripheral angiopathy without gangrene: Secondary | ICD-10-CM | POA: Diagnosis not present

## 2018-05-15 DIAGNOSIS — I10 Essential (primary) hypertension: Secondary | ICD-10-CM | POA: Diagnosis not present

## 2018-05-15 NOTE — Telephone Encounter (Signed)
See note

## 2018-05-15 NOTE — Telephone Encounter (Signed)
Copied from [place] [id number]. Topic: Quick Communication - See Telephone Encounter >> May 15, 2018 10:03 AM Synthia Innocent wrote: CRM for notification. See Telephone encounter for: 05/15/18. Home Health calling for wound care, Left heel calcium alginate 2x a week, coix foam dressage 2x

## 2018-05-15 NOTE — Telephone Encounter (Signed)
Called and provided verbal order as requested 

## 2018-05-18 DIAGNOSIS — H401122 Primary open-angle glaucoma, left eye, moderate stage: Secondary | ICD-10-CM | POA: Diagnosis not present

## 2018-05-18 LAB — HM DIABETES EYE EXAM

## 2018-05-19 ENCOUNTER — Encounter (INDEPENDENT_AMBULATORY_CARE_PROVIDER_SITE_OTHER): Payer: Self-pay

## 2018-05-19 ENCOUNTER — Ambulatory Visit
Admission: RE | Admit: 2018-05-19 | Discharge: 2018-05-19 | Disposition: A | Discharge status: Home / Self Care | Payer: Medicare Other | Source: Ambulatory Visit | Attending: Family Medicine | Admitting: Family Medicine

## 2018-05-19 DIAGNOSIS — G301 Alzheimer's disease with late onset: Secondary | ICD-10-CM | POA: Diagnosis not present

## 2018-05-19 DIAGNOSIS — R410 Disorientation, unspecified: Secondary | ICD-10-CM | POA: Diagnosis not present

## 2018-05-19 DIAGNOSIS — I10 Essential (primary) hypertension: Secondary | ICD-10-CM | POA: Diagnosis not present

## 2018-05-19 DIAGNOSIS — F0280 Dementia in other diseases classified elsewhere without behavioral disturbance: Secondary | ICD-10-CM | POA: Diagnosis not present

## 2018-05-19 DIAGNOSIS — M6281 Muscle weakness (generalized): Secondary | ICD-10-CM | POA: Diagnosis not present

## 2018-05-19 DIAGNOSIS — R413 Other amnesia: Secondary | ICD-10-CM

## 2018-05-19 DIAGNOSIS — E1151 Type 2 diabetes mellitus with diabetic peripheral angiopathy without gangrene: Secondary | ICD-10-CM | POA: Diagnosis not present

## 2018-05-19 DIAGNOSIS — J4540 Moderate persistent asthma, uncomplicated: Secondary | ICD-10-CM | POA: Diagnosis not present

## 2018-05-19 MED ORDER — GADOBENATE DIMEGLUMINE 529 MG/ML IV SOLN
10.0000 mL | Freq: Once | INTRAVENOUS | Status: AC | PRN
  Administered 2018-05-19: 10 mL via INTRAVENOUS

## 2018-05-20 ENCOUNTER — Encounter (INDEPENDENT_AMBULATORY_CARE_PROVIDER_SITE_OTHER): Payer: Self-pay | Admitting: Family

## 2018-05-20 ENCOUNTER — Ambulatory Visit (INDEPENDENT_AMBULATORY_CARE_PROVIDER_SITE_OTHER): Payer: Medicare Other | Admitting: Family

## 2018-05-20 VITALS — Ht 66.0 in | Wt 124.0 lb

## 2018-05-20 DIAGNOSIS — L89622 Pressure ulcer of left heel, stage 2: Secondary | ICD-10-CM | POA: Diagnosis not present

## 2018-05-20 DIAGNOSIS — I70211 Atherosclerosis of native arteries of extremities with intermittent claudication, right leg: Secondary | ICD-10-CM | POA: Diagnosis not present

## 2018-05-20 NOTE — Progress Notes (Signed)
Office Visit Note   Patient: [name]           Date of Birth: [birth year]           MRN: [id number] Visit Date: 05/20/2018              Requested by: Marin Olp, MD [place], [place] [address] PCP: Marin Olp, MD  Chief Complaint  Patient presents with  . Left Foot - Pain      HPI: Patient is an 84 year old woman evaluation of new ulceration to her left heel.  He has a history of Alzheimer's. Spouse accompanies, adds she has been spending majority of day in bed or recliner with pressure on heel. Were referred to [place] from her PCP, Dr. Yong Channel.   Spouse relates the ulcer has been present for about 3 weeks to their knowledge.   Having [facility] for wound care with [facility]. PT with [facility] as well. Doing silvercell dressings.  Assessment & Plan: Visit Diagnoses: No diagnosis found.  Plan: Daily wound cleansing and apply silver dressing with HH. Do add prafo when in recliner or bed. Have provided one today. Discussed risks of wound non healing with patient and spouse. To follow up in office in 2 weeks.   Follow-Up Instructions: No follow-ups on file.   Ortho Exam  Patient is alert, oriented, no adenopathy, well-dressed, normal affect, normal respiratory effort. On examination of left foot has posterior heel decubitus ulcer is 2 cm x 1 cm. This is 1 mm deep. Stage 2. Scant drainage. No surrounding maceration. No erythema. No odor. No sign of infection. Does not have LE edema.   Imaging: No results found. No images are attached to the encounter.  Labs: Lab Results  Component Value Date   HGBA1C 7.2 (A) 05/01/2018   HGBA1C 7.1 (H) 12/31/2017   HGBA1C 7.0 07/23/2017   ESRSEDRATE 4 11/17/2014   REPTSTATUS 07/21/2009 FINAL 07/20/2009   REPTSTATUS 07/21/2009 FINAL 07/20/2009   CULT  07/20/2009    Multiple bacterial morphotypes present, none predominant. Suggest appropriate recollection if clinically indicated.   LABORGA ESCHERICHIA COLI  08/13/2016     Lab Results  Component Value Date   ALBUMIN 3.4 (L) 05/01/2018   ALBUMIN 3.4 (L) 12/31/2017   ALBUMIN 3.6 07/23/2017    Body mass index is 20.01 kg/m.  Orders:  No orders of the defined types were placed in this encounter.  No orders of the defined types were placed in this encounter.    Procedures: No procedures performed  Clinical Data: No additional findings.  ROS:  All other systems negative, except as noted in the HPI. Review of Systems  Constitutional: Negative for chills and fever.  Cardiovascular: Negative for leg swelling.  Skin: Positive for color change and wound.    Objective: Vital Signs: Ht 5\' 6"  (1.676 m)   Wt 124 lb (56.2 kg)   BMI 20.01 kg/m   Specialty Comments:  No specialty comments available.  PMFS History: Patient Active Problem List   Diagnosis Date Noted  . Diabetic ulcer of foot with fat layer exposed ([place]) 05/14/2018  . GERD (gastroesophageal reflux disease) 05/02/2018  . Generalized weakness 05/02/2018  . History of DVT in adulthood 06/26/2017  . LBBB (left bundle branch block) 10/09/2016  . Pleural effusion, left 09/23/2016  . Coronary artery calcification seen on CAT scan 08/28/2016  . Alzheimer's disease 01/04/2016  . Pancreas cyst 07/02/2015  . Asthma, chronic 03/16/2015  . Primary open angle glaucoma  of left eye, moderate stage 12/25/2014  . Pseudophakia of both eyes 02/15/2013  . Anemia 05/25/2012  . Astigmatism 04/29/2012  . Allergic rhinitis 10/30/2010  . Venous (peripheral) insufficiency 04/13/2007  . Diabetes ([place]) 12/18/2006  . HYPERTENSION, BENIGN SYSTEMIC 12/18/2006  . Atherosclerosis of native arteries of extremity with intermittent claudication ([place]) 12/18/2006  . OSTEOARTHRITIS OF SPINE, NOS 12/18/2006   Past Medical History:  Diagnosis Date  . Alzheimer's dementia without behavioral disturbance    diagnosed 12/2015; Dr. Jade/neurology  . Anemia   . Asthma   . Blood transfusion 05-24-12    . Blood transfusion without reported diagnosis   . Chronic kidney disease    dr Clover Mealy  . Diabetes mellitus   . Diverticulosis   . DVT (deep venous thrombosis) ([place]) 03/2005   hx.left leg  . Esophageal stricture   . GERD (gastroesophageal reflux disease)   . Glaucoma   . Hiatal hernia 05/07/13  . Hyperlipidemia   . Hypertension   . Osteoarthritis   . Pancreatic duct dilated 05/07/13  . Pulmonary nodules 05/07/13  . PVD (peripheral vascular disease) ([place])   . Tubular adenoma of colon     Family History  Adopted: Yes  Problem Relation Age of Onset  . Migraines Daughter   . Cancer Son   . Colon cancer Neg Hx     Past Surgical History:  Procedure Laterality Date  . CATARACT EXTRACTION, BILATERAL    . COLONOSCOPY  05/27/2012   Procedure: COLONOSCOPY;  Surgeon: Lafayette Dragon, MD;  Location: [facility];  Service: Endoscopy;  Laterality: N/A;  . ESOPHAGOGASTRODUODENOSCOPY  05/26/2012   Procedure: ESOPHAGOGASTRODUODENOSCOPY (EGD);  Surgeon: Lafayette Dragon, MD;  Location: [facility];  Service: Endoscopy;  Laterality: N/A;  . EUS N/A 07/08/2013   Procedure: UPPER ENDOSCOPIC ULTRASOUND (EUS) LINEAR;  Surgeon: Milus Banister, MD;  Location: [facility];  Service: Endoscopy;  Laterality: N/A;  need side view scope  . EYE SURGERY    . NM MYOVIEW LTD  10/2016   Read as intermediate risk due to EF of 38%.Anemia or infarction noted. Only abnormal septal WM.   Marland Kitchen TRANSTHORACIC ECHOCARDIOGRAM  10/2016    EF 45-50% with possible inferior hypokinesis. Aortic sclerosis without stenosis (ordered to reassess EF from Myoview)  . VASCULAR SURGERY  09/2004  . WRIST FRACTURE SURGERY     R wrist fracture, plate   Social History   Occupational History  . Not on file  Tobacco Use  . Smoking status: Never Smoker  . Smokeless tobacco: Never Used  Substance and Sexual Activity  . Alcohol use: No  . Drug use: No  . Sexual activity: Not Currently

## 2018-05-21 DIAGNOSIS — G301 Alzheimer's disease with late onset: Secondary | ICD-10-CM | POA: Diagnosis not present

## 2018-05-21 DIAGNOSIS — E1151 Type 2 diabetes mellitus with diabetic peripheral angiopathy without gangrene: Secondary | ICD-10-CM | POA: Diagnosis not present

## 2018-05-21 DIAGNOSIS — M6281 Muscle weakness (generalized): Secondary | ICD-10-CM | POA: Diagnosis not present

## 2018-05-21 DIAGNOSIS — J4540 Moderate persistent asthma, uncomplicated: Secondary | ICD-10-CM | POA: Diagnosis not present

## 2018-05-21 DIAGNOSIS — I10 Essential (primary) hypertension: Secondary | ICD-10-CM | POA: Diagnosis not present

## 2018-05-21 DIAGNOSIS — F0280 Dementia in other diseases classified elsewhere without behavioral disturbance: Secondary | ICD-10-CM | POA: Diagnosis not present

## 2018-05-25 DIAGNOSIS — E1151 Type 2 diabetes mellitus with diabetic peripheral angiopathy without gangrene: Secondary | ICD-10-CM | POA: Diagnosis not present

## 2018-05-25 DIAGNOSIS — M6281 Muscle weakness (generalized): Secondary | ICD-10-CM | POA: Diagnosis not present

## 2018-05-25 DIAGNOSIS — J4540 Moderate persistent asthma, uncomplicated: Secondary | ICD-10-CM | POA: Diagnosis not present

## 2018-05-25 DIAGNOSIS — I10 Essential (primary) hypertension: Secondary | ICD-10-CM | POA: Diagnosis not present

## 2018-05-25 DIAGNOSIS — F0280 Dementia in other diseases classified elsewhere without behavioral disturbance: Secondary | ICD-10-CM | POA: Diagnosis not present

## 2018-05-25 DIAGNOSIS — G301 Alzheimer's disease with late onset: Secondary | ICD-10-CM | POA: Diagnosis not present

## 2018-05-26 ENCOUNTER — Ambulatory Visit: Payer: Medicare Other | Admitting: Family Medicine

## 2018-05-28 ENCOUNTER — Encounter: Payer: Self-pay | Admitting: Family Medicine

## 2018-05-28 ENCOUNTER — Ambulatory Visit (INDEPENDENT_AMBULATORY_CARE_PROVIDER_SITE_OTHER): Payer: Medicare Other | Admitting: Family Medicine

## 2018-05-28 DIAGNOSIS — M6281 Muscle weakness (generalized): Secondary | ICD-10-CM

## 2018-05-28 DIAGNOSIS — F0281 Dementia in other diseases classified elsewhere with behavioral disturbance: Secondary | ICD-10-CM

## 2018-05-28 DIAGNOSIS — R531 Weakness: Secondary | ICD-10-CM

## 2018-05-28 DIAGNOSIS — F02818 Dementia in other diseases classified elsewhere, unspecified severity, with other behavioral disturbance: Secondary | ICD-10-CM

## 2018-05-28 DIAGNOSIS — G301 Alzheimer's disease with late onset: Principal | ICD-10-CM

## 2018-05-28 NOTE — Progress Notes (Signed)
Completed home health certification and plan of care. I spent 10 minutes reviewing the plan.   [name]

## 2018-05-29 ENCOUNTER — Telehealth: Payer: Self-pay | Admitting: Family Medicine

## 2018-05-29 DIAGNOSIS — M6281 Muscle weakness (generalized): Secondary | ICD-10-CM | POA: Diagnosis not present

## 2018-05-29 DIAGNOSIS — E1151 Type 2 diabetes mellitus with diabetic peripheral angiopathy without gangrene: Secondary | ICD-10-CM | POA: Diagnosis not present

## 2018-05-29 DIAGNOSIS — F0280 Dementia in other diseases classified elsewhere without behavioral disturbance: Secondary | ICD-10-CM | POA: Diagnosis not present

## 2018-05-29 DIAGNOSIS — G301 Alzheimer's disease with late onset: Secondary | ICD-10-CM | POA: Diagnosis not present

## 2018-05-29 DIAGNOSIS — J4540 Moderate persistent asthma, uncomplicated: Secondary | ICD-10-CM | POA: Diagnosis not present

## 2018-05-29 DIAGNOSIS — I10 Essential (primary) hypertension: Secondary | ICD-10-CM | POA: Diagnosis not present

## 2018-05-29 NOTE — Telephone Encounter (Signed)
Copied from [place] [id number]. Topic: Quick Communication - See Telephone Encounter >> May 29, 2018  1:59 PM Bea Graff, NT wrote: CRM for notification. See Telephone encounter for: 05/29/18. Pete with  PT at [facility] calling for verbal orders to extend orders for another month for this pt. He would like to see if he can have an order for a walking boot for this pt as well. CB#: [phone]

## 2018-05-29 NOTE — Telephone Encounter (Signed)
Yes thanks to all questions

## 2018-06-01 NOTE — Telephone Encounter (Signed)
Called and spoke with [place]. I provided verbal orders for both the PT and the walking boot

## 2018-06-02 DIAGNOSIS — M6281 Muscle weakness (generalized): Secondary | ICD-10-CM | POA: Diagnosis not present

## 2018-06-02 DIAGNOSIS — F0280 Dementia in other diseases classified elsewhere without behavioral disturbance: Secondary | ICD-10-CM | POA: Diagnosis not present

## 2018-06-02 DIAGNOSIS — G301 Alzheimer's disease with late onset: Secondary | ICD-10-CM | POA: Diagnosis not present

## 2018-06-02 DIAGNOSIS — J4540 Moderate persistent asthma, uncomplicated: Secondary | ICD-10-CM | POA: Diagnosis not present

## 2018-06-02 DIAGNOSIS — I10 Essential (primary) hypertension: Secondary | ICD-10-CM | POA: Diagnosis not present

## 2018-06-02 DIAGNOSIS — E1151 Type 2 diabetes mellitus with diabetic peripheral angiopathy without gangrene: Secondary | ICD-10-CM | POA: Diagnosis not present

## 2018-06-03 ENCOUNTER — Ambulatory Visit (INDEPENDENT_AMBULATORY_CARE_PROVIDER_SITE_OTHER): Payer: Medicare Other | Admitting: Family

## 2018-06-03 ENCOUNTER — Encounter (INDEPENDENT_AMBULATORY_CARE_PROVIDER_SITE_OTHER): Payer: Self-pay | Admitting: Family

## 2018-06-03 DIAGNOSIS — E11621 Type 2 diabetes mellitus with foot ulcer: Secondary | ICD-10-CM

## 2018-06-03 DIAGNOSIS — I70211 Atherosclerosis of native arteries of extremities with intermittent claudication, right leg: Secondary | ICD-10-CM | POA: Diagnosis not present

## 2018-06-03 DIAGNOSIS — L97422 Non-pressure chronic ulcer of left heel and midfoot with fat layer exposed: Secondary | ICD-10-CM | POA: Diagnosis not present

## 2018-06-03 DIAGNOSIS — I872 Venous insufficiency (chronic) (peripheral): Secondary | ICD-10-CM | POA: Diagnosis not present

## 2018-06-04 DIAGNOSIS — F0281 Dementia in other diseases classified elsewhere with behavioral disturbance: Secondary | ICD-10-CM

## 2018-06-04 DIAGNOSIS — G301 Alzheimer's disease with late onset: Secondary | ICD-10-CM | POA: Diagnosis not present

## 2018-06-04 DIAGNOSIS — J4540 Moderate persistent asthma, uncomplicated: Secondary | ICD-10-CM | POA: Diagnosis not present

## 2018-06-04 DIAGNOSIS — I10 Essential (primary) hypertension: Secondary | ICD-10-CM | POA: Diagnosis not present

## 2018-06-04 DIAGNOSIS — R531 Weakness: Secondary | ICD-10-CM | POA: Diagnosis not present

## 2018-06-04 DIAGNOSIS — M6281 Muscle weakness (generalized): Secondary | ICD-10-CM | POA: Diagnosis not present

## 2018-06-04 DIAGNOSIS — E1151 Type 2 diabetes mellitus with diabetic peripheral angiopathy without gangrene: Secondary | ICD-10-CM | POA: Diagnosis not present

## 2018-06-04 DIAGNOSIS — F0280 Dementia in other diseases classified elsewhere without behavioral disturbance: Secondary | ICD-10-CM | POA: Diagnosis not present

## 2018-06-05 DIAGNOSIS — F0280 Dementia in other diseases classified elsewhere without behavioral disturbance: Secondary | ICD-10-CM | POA: Diagnosis not present

## 2018-06-05 DIAGNOSIS — I10 Essential (primary) hypertension: Secondary | ICD-10-CM | POA: Diagnosis not present

## 2018-06-05 DIAGNOSIS — E1151 Type 2 diabetes mellitus with diabetic peripheral angiopathy without gangrene: Secondary | ICD-10-CM | POA: Diagnosis not present

## 2018-06-05 DIAGNOSIS — J4540 Moderate persistent asthma, uncomplicated: Secondary | ICD-10-CM | POA: Diagnosis not present

## 2018-06-05 DIAGNOSIS — M6281 Muscle weakness (generalized): Secondary | ICD-10-CM | POA: Diagnosis not present

## 2018-06-05 DIAGNOSIS — G301 Alzheimer's disease with late onset: Secondary | ICD-10-CM | POA: Diagnosis not present

## 2018-06-05 NOTE — Progress Notes (Signed)
Office Visit Note   Patient: [name]           Date of Birth: 1931/03/10           MRN: [id number] Visit Date: 06/03/2018              Requested by: Marin Olp, MD [place], [place] [address] PCP: Marin Olp, MD  No chief complaint on file.     HPI: The patient is an 84 year old woman who presents today for reevaluation of decubitus ulcer to the left heel.  History of Alzheimer's.  Poor historian.  Spouse accompanies.  He has been providing her care.  [facility] home [facility] has been doing silver cell dressing changes there has been little change per their report and the ulcer.  Does have a PRAFO boot which she has been wearing around-the-clock   Assessment & Plan: Visit Diagnoses:  1. Diabetic ulcer of left heel associated with type 2 diabetes mellitus, with fat layer exposed ([place])   2. Venous (peripheral) insufficiency     Plan: Wound is stable.  Daily wound cleansing and apply silver dressing with HH. Do add prafo when in recliner or bed. Have provided one today. Discussed risks of wound non healing with patient and spouse. To follow up in office in 3 more weeks.   Follow-Up Instructions: Return in about 4 weeks (around 07/01/2018).   Ortho Exam  Patient is alert, oriented, no adenopathy, well-dressed, normal affect, normal respiratory effort. On examination of left foot has posterior heel decubitus ulcer is 1 cm in diameter with no depth.  Covered with eschar. no drainage. No surrounding maceration. No erythema. No odor. No sign of infection. Does not have LE edema.  Does have a palpable dorsalis pedis pulse.  Imaging: No results found. No images are attached to the encounter.  Labs: Lab Results  Component Value Date   HGBA1C 7.2 (A) 05/01/2018   HGBA1C 7.1 (H) 12/31/2017   HGBA1C 7.0 07/23/2017   ESRSEDRATE 4 11/17/2014   REPTSTATUS 07/21/2009 FINAL 07/20/2009   REPTSTATUS 07/21/2009 FINAL 07/20/2009   CULT  07/20/2009    Multiple  bacterial morphotypes present, none predominant. Suggest appropriate recollection if clinically indicated.   LABORGA ESCHERICHIA COLI 08/13/2016     Lab Results  Component Value Date   ALBUMIN 3.4 (L) 05/01/2018   ALBUMIN 3.4 (L) 12/31/2017   ALBUMIN 3.6 07/23/2017    There is no height or weight on file to calculate BMI.  Orders:  No orders of the defined types were placed in this encounter.  No orders of the defined types were placed in this encounter.    Procedures: No procedures performed  Clinical Data: No additional findings.  ROS:  All other systems negative, except as noted in the HPI. Review of Systems  Constitutional: Negative for chills and fever.  Cardiovascular: Negative for leg swelling.  Skin: Positive for color change and wound.    Objective: Vital Signs: There were no vitals taken for this visit.  Specialty Comments:  No specialty comments available.  PMFS History: Patient Active Problem List   Diagnosis Date Noted  . Diabetic ulcer of foot with fat layer exposed ([place]) 05/14/2018  . GERD (gastroesophageal reflux disease) 05/02/2018  . Generalized weakness 05/02/2018  . History of DVT in adulthood 06/26/2017  . LBBB (left bundle branch block) 10/09/2016  . Pleural effusion, left 09/23/2016  . Coronary artery calcification seen on CAT scan 08/28/2016  . Alzheimer's disease 01/04/2016  .  Pancreas cyst 07/02/2015  . Asthma, chronic 03/16/2015  . Primary open angle glaucoma of left eye, moderate stage 12/25/2014  . Pseudophakia of both eyes 02/15/2013  . Anemia 05/25/2012  . Astigmatism 04/29/2012  . Allergic rhinitis 10/30/2010  . Venous (peripheral) insufficiency 04/13/2007  . Diabetes ([place]) 12/18/2006  . HYPERTENSION, BENIGN SYSTEMIC 12/18/2006  . Atherosclerosis of native arteries of extremity with intermittent claudication ([place]) 12/18/2006  . OSTEOARTHRITIS OF SPINE, NOS 12/18/2006   Past Medical History:  Diagnosis Date  .  Alzheimer's dementia without behavioral disturbance    diagnosed 12/2015; Dr. Jade/neurology  . Anemia   . Asthma   . Blood transfusion 05-24-12  . Blood transfusion without reported diagnosis   . Chronic kidney disease    dr Clover Mealy  . Diabetes mellitus   . Diverticulosis   . DVT (deep venous thrombosis) ([place]) 03/2005   hx.left leg  . Esophageal stricture   . GERD (gastroesophageal reflux disease)   . Glaucoma   . Hiatal hernia 05/07/13  . Hyperlipidemia   . Hypertension   . Osteoarthritis   . Pancreatic duct dilated 05/07/13  . Pulmonary nodules 05/07/13  . PVD (peripheral vascular disease) ([place])   . Tubular adenoma of colon     Family History  Adopted: Yes  Problem Relation Age of Onset  . Migraines Daughter   . Cancer Son   . Colon cancer Neg Hx     Past Surgical History:  Procedure Laterality Date  . CATARACT EXTRACTION, BILATERAL    . COLONOSCOPY  05/27/2012   Procedure: COLONOSCOPY;  Surgeon: Lafayette Dragon, MD;  [facility];  Service: Endoscopy;  Laterality: N/A;  . ESOPHAGOGASTRODUODENOSCOPY  05/26/2012   Procedure: ESOPHAGOGASTRODUODENOSCOPY (EGD);  Surgeon: Lafayette Dragon, MD;  Location: [facility];  Service: Endoscopy;  Laterality: N/A;  . EUS N/A 07/08/2013   Procedure: UPPER ENDOSCOPIC ULTRASOUND (EUS) LINEAR;  Surgeon: Milus Banister, MD;  [facility];  Service: Endoscopy;  Laterality: N/A;  need side view scope  . EYE SURGERY    . NM MYOVIEW LTD  10/2016   Read as intermediate risk due to EF of 38%.Anemia or infarction noted. Only abnormal septal WM.   Marland Kitchen TRANSTHORACIC ECHOCARDIOGRAM  10/2016    EF 45-50% with possible inferior hypokinesis. Aortic sclerosis without stenosis (ordered to reassess EF from Myoview)  . VASCULAR SURGERY  09/2004  . WRIST FRACTURE SURGERY     R wrist fracture, plate   Social History   Occupational History  . Not on file  Tobacco Use  . Smoking status: Never Smoker  . Smokeless tobacco: Never Used  Substance  and Sexual Activity  . Alcohol use: No  . Drug use: No  . Sexual activity: Not Currently

## 2018-06-08 DIAGNOSIS — M6281 Muscle weakness (generalized): Secondary | ICD-10-CM | POA: Diagnosis not present

## 2018-06-08 DIAGNOSIS — G301 Alzheimer's disease with late onset: Secondary | ICD-10-CM | POA: Diagnosis not present

## 2018-06-08 DIAGNOSIS — E1151 Type 2 diabetes mellitus with diabetic peripheral angiopathy without gangrene: Secondary | ICD-10-CM | POA: Diagnosis not present

## 2018-06-08 DIAGNOSIS — I10 Essential (primary) hypertension: Secondary | ICD-10-CM | POA: Diagnosis not present

## 2018-06-08 DIAGNOSIS — J4540 Moderate persistent asthma, uncomplicated: Secondary | ICD-10-CM | POA: Diagnosis not present

## 2018-06-08 DIAGNOSIS — F0280 Dementia in other diseases classified elsewhere without behavioral disturbance: Secondary | ICD-10-CM | POA: Diagnosis not present

## 2018-06-09 DIAGNOSIS — M6281 Muscle weakness (generalized): Secondary | ICD-10-CM | POA: Diagnosis not present

## 2018-06-09 DIAGNOSIS — E1151 Type 2 diabetes mellitus with diabetic peripheral angiopathy without gangrene: Secondary | ICD-10-CM | POA: Diagnosis not present

## 2018-06-09 DIAGNOSIS — I10 Essential (primary) hypertension: Secondary | ICD-10-CM | POA: Diagnosis not present

## 2018-06-09 DIAGNOSIS — J4540 Moderate persistent asthma, uncomplicated: Secondary | ICD-10-CM | POA: Diagnosis not present

## 2018-06-09 DIAGNOSIS — F0280 Dementia in other diseases classified elsewhere without behavioral disturbance: Secondary | ICD-10-CM | POA: Diagnosis not present

## 2018-06-09 DIAGNOSIS — G301 Alzheimer's disease with late onset: Secondary | ICD-10-CM | POA: Diagnosis not present

## 2018-06-10 DIAGNOSIS — E1151 Type 2 diabetes mellitus with diabetic peripheral angiopathy without gangrene: Secondary | ICD-10-CM | POA: Diagnosis not present

## 2018-06-10 DIAGNOSIS — M6281 Muscle weakness (generalized): Secondary | ICD-10-CM | POA: Diagnosis not present

## 2018-06-10 DIAGNOSIS — J4540 Moderate persistent asthma, uncomplicated: Secondary | ICD-10-CM | POA: Diagnosis not present

## 2018-06-10 DIAGNOSIS — I10 Essential (primary) hypertension: Secondary | ICD-10-CM | POA: Diagnosis not present

## 2018-06-10 DIAGNOSIS — G301 Alzheimer's disease with late onset: Secondary | ICD-10-CM | POA: Diagnosis not present

## 2018-06-10 DIAGNOSIS — F0280 Dementia in other diseases classified elsewhere without behavioral disturbance: Secondary | ICD-10-CM | POA: Diagnosis not present

## 2018-06-15 DIAGNOSIS — G301 Alzheimer's disease with late onset: Secondary | ICD-10-CM | POA: Diagnosis not present

## 2018-06-15 DIAGNOSIS — F0280 Dementia in other diseases classified elsewhere without behavioral disturbance: Secondary | ICD-10-CM | POA: Diagnosis not present

## 2018-06-15 DIAGNOSIS — M6281 Muscle weakness (generalized): Secondary | ICD-10-CM | POA: Diagnosis not present

## 2018-06-15 DIAGNOSIS — J4540 Moderate persistent asthma, uncomplicated: Secondary | ICD-10-CM | POA: Diagnosis not present

## 2018-06-15 DIAGNOSIS — I10 Essential (primary) hypertension: Secondary | ICD-10-CM | POA: Diagnosis not present

## 2018-06-15 DIAGNOSIS — E1151 Type 2 diabetes mellitus with diabetic peripheral angiopathy without gangrene: Secondary | ICD-10-CM | POA: Diagnosis not present

## 2018-06-17 ENCOUNTER — Telehealth: Payer: Self-pay | Admitting: Family Medicine

## 2018-06-17 DIAGNOSIS — J4540 Moderate persistent asthma, uncomplicated: Secondary | ICD-10-CM | POA: Diagnosis not present

## 2018-06-17 DIAGNOSIS — E1151 Type 2 diabetes mellitus with diabetic peripheral angiopathy without gangrene: Secondary | ICD-10-CM | POA: Diagnosis not present

## 2018-06-17 DIAGNOSIS — G301 Alzheimer's disease with late onset: Secondary | ICD-10-CM | POA: Diagnosis not present

## 2018-06-17 DIAGNOSIS — F0280 Dementia in other diseases classified elsewhere without behavioral disturbance: Secondary | ICD-10-CM | POA: Diagnosis not present

## 2018-06-17 DIAGNOSIS — I10 Essential (primary) hypertension: Secondary | ICD-10-CM | POA: Diagnosis not present

## 2018-06-17 DIAGNOSIS — M6281 Muscle weakness (generalized): Secondary | ICD-10-CM | POA: Diagnosis not present

## 2018-06-17 NOTE — Telephone Encounter (Signed)
Copied from [place] [id number]. Topic: General - Other >> Jun 17, 2018  2:49 PM Cecelia Byars, NT wrote: Reason for CRM: Collier Salina from [facility] called and said she was set to be d/c but she is doing good and he would like to see her another 2 weeks please advise [phone]  ok to lv msg

## 2018-06-17 NOTE — Telephone Encounter (Signed)
See note

## 2018-06-18 NOTE — Telephone Encounter (Signed)
Called and spoke with Laurey Arrow who is requesting 2 more weeks of PT to work with patient on her walking.Provided order as requested

## 2018-06-23 ENCOUNTER — Ambulatory Visit (INDEPENDENT_AMBULATORY_CARE_PROVIDER_SITE_OTHER): Payer: Medicare Other | Admitting: Orthopedic Surgery

## 2018-06-23 ENCOUNTER — Encounter (INDEPENDENT_AMBULATORY_CARE_PROVIDER_SITE_OTHER): Payer: Self-pay | Admitting: Orthopedic Surgery

## 2018-06-23 DIAGNOSIS — J4540 Moderate persistent asthma, uncomplicated: Secondary | ICD-10-CM | POA: Diagnosis not present

## 2018-06-23 DIAGNOSIS — I70211 Atherosclerosis of native arteries of extremities with intermittent claudication, right leg: Secondary | ICD-10-CM

## 2018-06-23 DIAGNOSIS — I10 Essential (primary) hypertension: Secondary | ICD-10-CM | POA: Diagnosis not present

## 2018-06-23 DIAGNOSIS — F0280 Dementia in other diseases classified elsewhere without behavioral disturbance: Secondary | ICD-10-CM | POA: Diagnosis not present

## 2018-06-23 DIAGNOSIS — G301 Alzheimer's disease with late onset: Secondary | ICD-10-CM | POA: Diagnosis not present

## 2018-06-23 DIAGNOSIS — L89622 Pressure ulcer of left heel, stage 2: Secondary | ICD-10-CM

## 2018-06-23 DIAGNOSIS — M6281 Muscle weakness (generalized): Secondary | ICD-10-CM | POA: Diagnosis not present

## 2018-06-23 DIAGNOSIS — I872 Venous insufficiency (chronic) (peripheral): Secondary | ICD-10-CM | POA: Diagnosis not present

## 2018-06-23 DIAGNOSIS — E1151 Type 2 diabetes mellitus with diabetic peripheral angiopathy without gangrene: Secondary | ICD-10-CM | POA: Diagnosis not present

## 2018-06-23 NOTE — Progress Notes (Signed)
Office Visit Note   Patient: [name]           Date of Birth: 1931-09-10           MRN: [id number] Visit Date: 06/23/2018              Requested by: Marin Olp, MD [place], [place] [address] PCP: Marin Olp, MD  Chief Complaint  Patient presents with  . Left Foot - Follow-up      HPI: Patient is an 84 year old woman with a left heel decubitus ulcer with peripheral vascular disease.  Assessment & Plan: Visit Diagnoses:  1. Pressure injury of left heel, stage 2   2. Venous (peripheral) insufficiency     Plan: Patient will continue with the [facility] on the left recommended exercise to improve the circulation to her feet recommended continuing with the home health nursing dressing change and wear the [facility] around-the-clock.  Follow-Up Instructions: Return in about 3 weeks (around 07/14/2018).   Ortho Exam  Patient is alert, not oriented, no adenopathy, well-dressed, normal affect, normal respiratory effort. Patient does have Alzheimer's dementia and does repeat the same question several times.  Examination she does not have a palpable pulse in either lower extremity but her feet are warm.  She is status post revascularization with Dr. Kellie Simmering on the right.  Right heel shows a scab over the heel this was removed there is good epithelization of the base.  On the left foot she does have a decubitus ulcer.  After informed consent a 10 blade knife was used to debride the skin and soft tissue back to bleeding viable granulation tissue after debridement the ulcer is 2 cm diameter 1 mm deep there is still a central area of eschar that is about 10 mm in diameter.  There is no redness no cellulitis no odor no drainage no exposed bone no signs of infection.  Iodosorb and a Band-Aid was applied.  Imaging: No results found. No images are attached to the encounter.  Labs: Lab Results  Component Value Date   HGBA1C 7.2 (A) 05/01/2018   HGBA1C 7.1 (H) 12/31/2017    HGBA1C 7.0 07/23/2017   ESRSEDRATE 4 11/17/2014   REPTSTATUS 07/21/2009 FINAL 07/20/2009   REPTSTATUS 07/21/2009 FINAL 07/20/2009   CULT  07/20/2009    Multiple bacterial morphotypes present, none predominant. Suggest appropriate recollection if clinically indicated.   LABORGA ESCHERICHIA COLI 08/13/2016     Lab Results  Component Value Date   ALBUMIN 3.4 (L) 05/01/2018   ALBUMIN 3.4 (L) 12/31/2017   ALBUMIN 3.6 07/23/2017    There is no height or weight on file to calculate BMI.  Orders:  No orders of the defined types were placed in this encounter.  No orders of the defined types were placed in this encounter.    Procedures: No procedures performed  Clinical Data: No additional findings.  ROS:  All other systems negative, except as noted in the HPI. Review of Systems  Objective: Vital Signs: There were no vitals taken for this visit.  Specialty Comments:  No specialty comments available.  PMFS History: Patient Active Problem List   Diagnosis Date Noted  . Diabetic ulcer of foot with fat layer exposed ([place]) 05/14/2018  . GERD (gastroesophageal reflux disease) 05/02/2018  . Generalized weakness 05/02/2018  . History of DVT in adulthood 06/26/2017  . LBBB (left bundle branch block) 10/09/2016  . Pleural effusion, left 09/23/2016  . Coronary artery calcification seen on CAT scan  08/28/2016  . Alzheimer's disease 01/04/2016  . Pancreas cyst 07/02/2015  . Asthma, chronic 03/16/2015  . Primary open angle glaucoma of left eye, moderate stage 12/25/2014  . Pseudophakia of both eyes 02/15/2013  . Anemia 05/25/2012  . Astigmatism 04/29/2012  . Allergic rhinitis 10/30/2010  . Venous (peripheral) insufficiency 04/13/2007  . Diabetes ([place]) 12/18/2006  . HYPERTENSION, BENIGN SYSTEMIC 12/18/2006  . Atherosclerosis of native arteries of extremity with intermittent claudication ([place]) 12/18/2006  . OSTEOARTHRITIS OF SPINE, NOS 12/18/2006   Past Medical History:    Diagnosis Date  . Alzheimer's dementia without behavioral disturbance    diagnosed 12/2015; Dr. Jade/neurology  . Anemia   . Asthma   . Blood transfusion 05-24-12  . Blood transfusion without reported diagnosis   . Chronic kidney disease    dr Clover Mealy  . Diabetes mellitus   . Diverticulosis   . DVT (deep venous thrombosis) ([place]) 03/2005   hx.left leg  . Esophageal stricture   . GERD (gastroesophageal reflux disease)   . Glaucoma   . Hiatal hernia 05/07/13  . Hyperlipidemia   . Hypertension   . Osteoarthritis   . Pancreatic duct dilated 05/07/13  . Pulmonary nodules 05/07/13  . PVD (peripheral vascular disease) ([place])   . Tubular adenoma of colon     Family History  Adopted: Yes  Problem Relation Age of Onset  . Migraines Daughter   . Cancer Son   . Colon cancer Neg Hx     Past Surgical History:  Procedure Laterality Date  . CATARACT EXTRACTION, BILATERAL    . COLONOSCOPY  05/27/2012   Procedure: COLONOSCOPY;  Surgeon: Lafayette Dragon, MD;  [facility];  Service: Endoscopy;  Laterality: N/A;  . ESOPHAGOGASTRODUODENOSCOPY  05/26/2012   Procedure: ESOPHAGOGASTRODUODENOSCOPY (EGD);  Surgeon: Lafayette Dragon, MD;  Location: [facility];  Service: Endoscopy;  Laterality: N/A;  . EUS N/A 07/08/2013   Procedure: UPPER ENDOSCOPIC ULTRASOUND (EUS) LINEAR;  Surgeon: Milus Banister, MD;  [facility];  Service: Endoscopy;  Laterality: N/A;  need side view scope  . EYE SURGERY    . NM MYOVIEW LTD  10/2016   Read as intermediate risk due to EF of 38%.Anemia or infarction noted. Only abnormal septal WM.   Marland Kitchen TRANSTHORACIC ECHOCARDIOGRAM  10/2016    EF 45-50% with possible inferior hypokinesis. Aortic sclerosis without stenosis (ordered to reassess EF from Myoview)  . VASCULAR SURGERY  09/2004  . WRIST FRACTURE SURGERY     R wrist fracture, plate   Social History   Occupational History  . Not on file  Tobacco Use  . Smoking status: Never Smoker  . Smokeless tobacco:  Never Used  Substance and Sexual Activity  . Alcohol use: No  . Drug use: No  . Sexual activity: Not Currently

## 2018-06-24 ENCOUNTER — Ambulatory Visit (INDEPENDENT_AMBULATORY_CARE_PROVIDER_SITE_OTHER)
Admission: RE | Admit: 2018-06-24 | Discharge: 2018-06-24 | Disposition: A | Discharge status: Home / Self Care | Payer: Medicare Other | Source: Ambulatory Visit | Attending: Family Medicine | Admitting: Family Medicine

## 2018-06-24 ENCOUNTER — Ambulatory Visit (INDEPENDENT_AMBULATORY_CARE_PROVIDER_SITE_OTHER): Payer: Self-pay | Admitting: Orthopedic Surgery

## 2018-06-24 DIAGNOSIS — M810 Age-related osteoporosis without current pathological fracture: Secondary | ICD-10-CM

## 2018-06-25 DIAGNOSIS — J4540 Moderate persistent asthma, uncomplicated: Secondary | ICD-10-CM | POA: Diagnosis not present

## 2018-06-25 DIAGNOSIS — I10 Essential (primary) hypertension: Secondary | ICD-10-CM | POA: Diagnosis not present

## 2018-06-25 DIAGNOSIS — E1151 Type 2 diabetes mellitus with diabetic peripheral angiopathy without gangrene: Secondary | ICD-10-CM | POA: Diagnosis not present

## 2018-06-25 DIAGNOSIS — F0280 Dementia in other diseases classified elsewhere without behavioral disturbance: Secondary | ICD-10-CM | POA: Diagnosis not present

## 2018-06-25 DIAGNOSIS — G301 Alzheimer's disease with late onset: Secondary | ICD-10-CM | POA: Diagnosis not present

## 2018-06-25 DIAGNOSIS — M6281 Muscle weakness (generalized): Secondary | ICD-10-CM | POA: Diagnosis not present

## 2018-06-26 DIAGNOSIS — F0280 Dementia in other diseases classified elsewhere without behavioral disturbance: Secondary | ICD-10-CM | POA: Diagnosis not present

## 2018-06-26 DIAGNOSIS — G301 Alzheimer's disease with late onset: Secondary | ICD-10-CM | POA: Diagnosis not present

## 2018-06-26 DIAGNOSIS — E1151 Type 2 diabetes mellitus with diabetic peripheral angiopathy without gangrene: Secondary | ICD-10-CM | POA: Diagnosis not present

## 2018-06-26 DIAGNOSIS — M6281 Muscle weakness (generalized): Secondary | ICD-10-CM | POA: Diagnosis not present

## 2018-06-26 DIAGNOSIS — I10 Essential (primary) hypertension: Secondary | ICD-10-CM | POA: Diagnosis not present

## 2018-06-26 DIAGNOSIS — J4540 Moderate persistent asthma, uncomplicated: Secondary | ICD-10-CM | POA: Diagnosis not present

## 2018-07-02 ENCOUNTER — Encounter: Payer: Self-pay | Admitting: Family Medicine

## 2018-07-02 ENCOUNTER — Ambulatory Visit (INDEPENDENT_AMBULATORY_CARE_PROVIDER_SITE_OTHER): Payer: Medicare Other | Admitting: Family Medicine

## 2018-07-02 VITALS — BP 126/84 | HR 88 | Temp 97.8°F | Ht 66.0 in | Wt 122.0 lb

## 2018-07-02 DIAGNOSIS — E119 Type 2 diabetes mellitus without complications: Secondary | ICD-10-CM | POA: Diagnosis not present

## 2018-07-02 DIAGNOSIS — B351 Tinea unguium: Secondary | ICD-10-CM | POA: Diagnosis not present

## 2018-07-02 DIAGNOSIS — J3489 Other specified disorders of nose and nasal sinuses: Secondary | ICD-10-CM | POA: Diagnosis not present

## 2018-07-02 DIAGNOSIS — Z23 Encounter for immunization: Secondary | ICD-10-CM | POA: Diagnosis not present

## 2018-07-02 DIAGNOSIS — G301 Alzheimer's disease with late onset: Secondary | ICD-10-CM

## 2018-07-02 DIAGNOSIS — M810 Age-related osteoporosis without current pathological fracture: Secondary | ICD-10-CM | POA: Insufficient documentation

## 2018-07-02 DIAGNOSIS — F0281 Dementia in other diseases classified elsewhere with behavioral disturbance: Secondary | ICD-10-CM | POA: Diagnosis not present

## 2018-07-02 DIAGNOSIS — I70211 Atherosclerosis of native arteries of extremities with intermittent claudication, right leg: Secondary | ICD-10-CM

## 2018-07-02 NOTE — Assessment & Plan Note (Signed)
S:Bone density shows osteoporosis with worst t score of -3.2.  A/P:Hold off on fosamax due to issues eating, in case reflux were to cause worsening food intake- we opted to focus on improving her PO intake first

## 2018-07-02 NOTE — Assessment & Plan Note (Signed)
S: a1c reasonably controlled on metformin 500mg  daily.  Lab Results  Component Value Date   HGBA1C 7.2 (A) 05/01/2018   A/P:  stop metformin- will let a1c trend up but may help with appetite. With her dementia and debility I think a1c goal of 8.5 or 9 is reasonable

## 2018-07-02 NOTE — Patient Instructions (Addendum)
Health Maintenance Due  Topic Date Due  . URINE MICROALBUMIN - please drop this off with [place] 04/18/2018  . INFLUENZA VACCINE - today 05/21/2018   Until weight loss stabilizes and foot intake increases Im somewhat hesitant to start fosamax though she does have osteoporosis- will hold off for now  Have her try to at least do 1 boost for lunch at least (would honestly think 2-3 a day would be reasonable for her)  We will call you within two weeks about your referral to ear/nose/throat doctors and podiatry (2 separate referrals). If you do not hear within 3 weeks, give [place] a call.   If you are ok with it- our team can also enter referral to neurology for another opinion on memory loss/likely alzheimers and for help with more resources  Lets check in 2-3 months from now

## 2018-07-02 NOTE — Assessment & Plan Note (Signed)
S: Patient is  On aricept 10mg  and namenda 5mg  twice a day. Has seen Dr. Tomi Likens years ago- had not had recent imaging and family thought symptoms were worsening so wanted to rule out stroke or mass.   MRI brain reviewed together. We reviewed there was a prior stroke on MRI. We discussed could consider statin in light of this. Given her worsening dementia, muscle weaknss- we opted to hold off.   Im not sure what her baseline is but she seems to make some inappropriate remarks such as "by me a sexy swimsuit" and a few lewd comments to a female nursing student today  Weight loss, not eating - family reports 3-4 years of issues but worse lately- BMI has dropped under 20 and she has lost another 2 lbs from last visit. Eating small breakfast, no lunch, decent dinner. Having to be fed breakfast to get her to eat. She states she likes boost but they state she doesn't drink very much A/P: Will refer back to Dr. Cecille Amsterdam after talking to family. I really think her increased PO is due to worsening dementia which they state has been present for several years. They ask about using medications to stimulate appetite and I told them they reading I had done did not indicate helpful long term outcomes- but would be open to Dr. Georgie Chard opinion as well. I am going to stop metformin to see if that will help. Holding off on fosamax due to concerns may worsen eating in relation to reflux.   Follow up 2-3 months

## 2018-07-02 NOTE — Progress Notes (Signed)
Subjective:  Nely Dedmon is a 84 y.o. year old very pleasant female patient who presents for/with See problem oriented charting ROS- no chest pain or shortness of breath. Some left knee pain and swelling. Wound on left heel improving.    Past Medical History-  Patient Active Problem List   Diagnosis Date Noted  . History of DVT in adulthood 06/26/2017    Priority: High  . Coronary artery calcification seen on CAT scan 08/28/2016    Priority: High  . Alzheimer's disease 01/04/2016    Priority: High  . Diabetes ([place]) 12/18/2006    Priority: High  . Atherosclerosis of native arteries of extremity with intermittent claudication ([place]) 12/18/2006    Priority: High  . Generalized weakness 05/02/2018    Priority: Medium  . Pancreas cyst 07/02/2015    Priority: Medium  . Asthma, chronic 03/16/2015    Priority: Medium  . Primary open angle glaucoma of left eye, moderate stage 12/25/2014    Priority: Medium  . HYPERTENSION, BENIGN SYSTEMIC 12/18/2006    Priority: Medium  . GERD (gastroesophageal reflux disease) 05/02/2018    Priority: Low  . LBBB (left bundle branch block) 10/09/2016    Priority: Low  . Pleural effusion, left 09/23/2016    Priority: Low  . Pseudophakia of both eyes 02/15/2013    Priority: Low  . Anemia 05/25/2012    Priority: Low  . Astigmatism 04/29/2012    Priority: Low  . Allergic rhinitis 10/30/2010    Priority: Low  . Venous (peripheral) insufficiency 04/13/2007    Priority: Low  . OSTEOARTHRITIS OF SPINE, NOS 12/18/2006    Priority: Low  . Osteoporosis 07/02/2018  . Diabetic ulcer of foot with fat layer exposed ([place]) 05/14/2018    Medications- reviewed and updated Current Outpatient Medications  Medication Sig Dispense Refill  . albuterol (PROAIR HFA) 108 (90 Base) MCG/ACT inhaler 2 PUFFS EVERY 6 HOURS AS NEEDED FOR SHORTNESS OF BREATH. 17 g 5  . albuterol (PROVENTIL) (2.5 MG/3ML) 0.083% nebulizer solution USE THREE MILLILITERS VIA NEBULIZATION BY  MOUTH EVERY 6 HOURS AS NEEDED FOR WHEEZING OR SHORTNESS OF BREATH 75 mL 4  . Ascorbic Acid (VITAMIN C) 1000 MG tablet Take 1,000 mg by mouth daily.    Marland Kitchen aspirin 81 MG tablet Take 81 mg by mouth daily.    Marland Kitchen azelastine (ASTELIN) 0.1 % nasal spray Place 1 spray into both nostrils 2 (two) times daily. Use in each nostril as directed 30 mL 11  . brimonidine (ALPHAGAN P) 0.1 % SOLN INSTILL 1 DROP IN BOTH EYE TWICE DAILY.    . budesonide (PULMICORT) 0.25 MG/2ML nebulizer solution Take 2 mLs (0.25 mg total) daily by nebulization. ICD10 Code-J45.909 60 mL 12  . calcium carbonate (OS-CAL) 600 MG TABS Take 600 mg by mouth 2 (two) times daily with a meal.    . citalopram (CELEXA) 10 MG tablet Take 1 tablet (10 mg total) daily by mouth. 90 tablet 1  . donepezil (ARICEPT) 10 MG tablet Take 1 tablet (10 mg total) by mouth at bedtime. 90 tablet 3  . famotidine (PEPCID) 20 MG tablet Take 1 tablet by mouth at bedtime.    . IRON PO Take 1 day or 1 dose by mouth.    . memantine (NAMENDA) 5 MG tablet Take 1 tablet (5 mg total) by mouth 2 (two) times daily. 180 tablet 3  . metFORMIN (GLUCOPHAGE) 500 MG tablet TAKE 1 TABLET ONCE DAILY WITH SUPPER. 90 tablet 3  . Multiple Vitamin (MULTIVITAMIN WITH MINERALS)  TABS tablet Take 1 tablet by mouth daily.     No current facility-administered medications for this visit.     Objective: BP 126/84 (BP Location: Left Arm, Patient Position: Sitting, Cuff Size: Normal)   Pulse 88   Temp 97.8 F (36.6 C) (Oral)   Ht 5\' 6"  (1.676 m)   Wt 122 lb (55.3 kg)   SpO2 98%   BMI 19.69 kg/m  Gen: NAD, resting comfortably CV: RRR no murmurs rubs or gallops Lungs: CTAB no crackles, wheeze, rhonchi Ext: no edema Skin: warm, dry Quarter or bigger size prior on left heel ulceration, today down to dime, also with dime sized superficial callous on right heel. ulcer on bottom of left foot at 1st MTp joint under callous- no pain  Assessment/Plan:  Topics discussed today: 1.  Heels/nails (onychomycosis noted)- refer to podiatry. She will continue with wound care under Dr. Sharol Given for left heel which has significantly improved.  2. Per family Left knee swelling -no significant edema. We will monitor.  3. Runny nose/chronic rhinorrhea for years (always has a tissue)- refer to ENT for their opinion, topical sprays havent been helpful  Alzheimer's disease S: Patient is  On aricept 10mg  and namenda 5mg  twice a day. Has seen Dr. Tomi Likens years ago- had not had recent imaging and family thought symptoms were worsening so wanted to rule out stroke or mass.   MRI brain reviewed together. We reviewed there was a prior stroke on MRI. We discussed could consider statin in light of this. Given her worsening dementia, muscle weaknss- we opted to hold off.   Im not sure what her baseline is but she seems to make some inappropriate remarks such as "by me a sexy swimsuit" and a few lewd comments to a female nursing student today  Weight loss, not eating - family reports 3-4 years of issues but worse lately- BMI has dropped under 20 and she has lost another 2 lbs from last visit. Eating small breakfast, no lunch, decent dinner. Having to be fed breakfast to get her to eat. She states she likes boost but they state she doesn't drink very much A/P: Will refer back to Dr. Cecille Amsterdam after talking to family. I really think her increased PO is due to worsening dementia which they state has been present for several years. They ask about using medications to stimulate appetite and I told them they reading I had done did not indicate helpful long term outcomes- but would be open to Dr. Georgie Chard opinion as well. I am going to stop metformin to see if that will help. Holding off on fosamax due to concerns may worsen eating in relation to reflux.   Follow up 2-3 months  Diabetes ([place]) S: a1c reasonably controlled on metformin 500mg  daily.  Lab Results  Component Value Date   HGBA1C 7.2 (A) 05/01/2018    A/P:  stop metformin- will let a1c trend up but may help with appetite. With her dementia and debility I think a1c goal of 8.5 or 9 is reasonable   Osteoporosis S:Bone density shows osteoporosis with worst t score of -3.2.  A/P:Hold off on fosamax due to issues eating, in case reflux were to cause worsening food intake- we opted to focus on improving her PO intake first   Future Appointments  Date Time Provider [place]  07/15/2018  2:00 PM Newt Minion, MD [facility] None  10/01/2018  2:30 PM Marin Olp, MD [facility]   Lab/Order associations: Need for prophylactic  vaccination and inoculation against influenza - Plan: Flu vaccine HIGH DOSE PF  Age-related osteoporosis without current pathological fracture  Rhinorrhea - Plan: Ambulatory referral to ENT  Fungal infection of toenail - Plan: Ambulatory referral to Podiatry  Late onset Alzheimer's disease with behavioral disturbance - Plan: Ambulatory referral to Neurology  Type 2 diabetes mellitus without complication, without long-term current use of insulin ([place])  Return precautions advised.  Garret Reddish, MD

## 2018-07-03 DIAGNOSIS — G301 Alzheimer's disease with late onset: Secondary | ICD-10-CM | POA: Diagnosis not present

## 2018-07-03 DIAGNOSIS — I10 Essential (primary) hypertension: Secondary | ICD-10-CM | POA: Diagnosis not present

## 2018-07-03 DIAGNOSIS — E1151 Type 2 diabetes mellitus with diabetic peripheral angiopathy without gangrene: Secondary | ICD-10-CM | POA: Diagnosis not present

## 2018-07-03 DIAGNOSIS — F0280 Dementia in other diseases classified elsewhere without behavioral disturbance: Secondary | ICD-10-CM | POA: Diagnosis not present

## 2018-07-03 DIAGNOSIS — J4540 Moderate persistent asthma, uncomplicated: Secondary | ICD-10-CM | POA: Diagnosis not present

## 2018-07-03 DIAGNOSIS — M6281 Muscle weakness (generalized): Secondary | ICD-10-CM | POA: Diagnosis not present

## 2018-07-06 ENCOUNTER — Telehealth: Payer: Self-pay | Admitting: Family Medicine

## 2018-07-06 DIAGNOSIS — G301 Alzheimer's disease with late onset: Secondary | ICD-10-CM | POA: Diagnosis not present

## 2018-07-06 DIAGNOSIS — F0280 Dementia in other diseases classified elsewhere without behavioral disturbance: Secondary | ICD-10-CM | POA: Diagnosis not present

## 2018-07-06 DIAGNOSIS — E1151 Type 2 diabetes mellitus with diabetic peripheral angiopathy without gangrene: Secondary | ICD-10-CM | POA: Diagnosis not present

## 2018-07-06 DIAGNOSIS — M6281 Muscle weakness (generalized): Secondary | ICD-10-CM | POA: Diagnosis not present

## 2018-07-06 DIAGNOSIS — I10 Essential (primary) hypertension: Secondary | ICD-10-CM | POA: Diagnosis not present

## 2018-07-06 DIAGNOSIS — J4540 Moderate persistent asthma, uncomplicated: Secondary | ICD-10-CM | POA: Diagnosis not present

## 2018-07-06 NOTE — Telephone Encounter (Signed)
Copied from [place] [phone]. Topic: Inquiry >> Jul 06, 2018 11:43 AM Oliver Pila B wrote: Reason for CRM: [facility] called to inform pcp that pt's left second toe is swollen; the nail is about to come off; also redness and warmth; contact to advise; [facility] will be there for another 57mins contact [phone]

## 2018-07-06 NOTE — Telephone Encounter (Signed)
From OV note 07/02/18:  Topics discussed today: 1. Heels/nails (onychomycosis noted)- refer to podiatry. She will continue with wound care under Dr. Sharol Given for left heel which has significantly improved.  2. Per family Left knee swelling -no significant edema. We will monitor  Please advise

## 2018-07-06 NOTE — Telephone Encounter (Signed)
See note

## 2018-07-06 NOTE — Telephone Encounter (Signed)
When is her podiatry appointment scheduled?

## 2018-07-07 DIAGNOSIS — F0280 Dementia in other diseases classified elsewhere without behavioral disturbance: Secondary | ICD-10-CM | POA: Diagnosis not present

## 2018-07-07 DIAGNOSIS — E11621 Type 2 diabetes mellitus with foot ulcer: Secondary | ICD-10-CM | POA: Diagnosis not present

## 2018-07-07 DIAGNOSIS — L97523 Non-pressure chronic ulcer of other part of left foot with necrosis of muscle: Secondary | ICD-10-CM | POA: Diagnosis not present

## 2018-07-07 DIAGNOSIS — G301 Alzheimer's disease with late onset: Secondary | ICD-10-CM | POA: Diagnosis not present

## 2018-07-07 DIAGNOSIS — L89620 Pressure ulcer of left heel, unstageable: Secondary | ICD-10-CM | POA: Diagnosis not present

## 2018-07-07 DIAGNOSIS — J4540 Moderate persistent asthma, uncomplicated: Secondary | ICD-10-CM | POA: Diagnosis not present

## 2018-07-08 DIAGNOSIS — F0280 Dementia in other diseases classified elsewhere without behavioral disturbance: Secondary | ICD-10-CM | POA: Diagnosis not present

## 2018-07-08 DIAGNOSIS — L97523 Non-pressure chronic ulcer of other part of left foot with necrosis of muscle: Secondary | ICD-10-CM | POA: Diagnosis not present

## 2018-07-08 DIAGNOSIS — E11621 Type 2 diabetes mellitus with foot ulcer: Secondary | ICD-10-CM | POA: Diagnosis not present

## 2018-07-08 DIAGNOSIS — L89620 Pressure ulcer of left heel, unstageable: Secondary | ICD-10-CM | POA: Diagnosis not present

## 2018-07-08 DIAGNOSIS — G301 Alzheimer's disease with late onset: Secondary | ICD-10-CM | POA: Diagnosis not present

## 2018-07-08 DIAGNOSIS — J4540 Moderate persistent asthma, uncomplicated: Secondary | ICD-10-CM | POA: Diagnosis not present

## 2018-07-09 DIAGNOSIS — G301 Alzheimer's disease with late onset: Secondary | ICD-10-CM | POA: Diagnosis not present

## 2018-07-09 DIAGNOSIS — L89620 Pressure ulcer of left heel, unstageable: Secondary | ICD-10-CM | POA: Diagnosis not present

## 2018-07-09 DIAGNOSIS — J4540 Moderate persistent asthma, uncomplicated: Secondary | ICD-10-CM | POA: Diagnosis not present

## 2018-07-09 DIAGNOSIS — L97523 Non-pressure chronic ulcer of other part of left foot with necrosis of muscle: Secondary | ICD-10-CM | POA: Diagnosis not present

## 2018-07-09 DIAGNOSIS — E11621 Type 2 diabetes mellitus with foot ulcer: Secondary | ICD-10-CM | POA: Diagnosis not present

## 2018-07-09 DIAGNOSIS — F0280 Dementia in other diseases classified elsewhere without behavioral disturbance: Secondary | ICD-10-CM | POA: Diagnosis not present

## 2018-07-09 NOTE — Telephone Encounter (Signed)
No- needs to get sooner appointment with Dr. Sharol Given. He is truly an expert in these type of ulcerations- I would only want to refer to wound care center if he suggests it- Roselyn Reef please call to try to get that appointment moved up

## 2018-07-09 NOTE — Telephone Encounter (Signed)
[name] with [facility] called as she out to evaluate the ulcer on the left foot. She states it looks like it has puss in it. She is going to clean and treat the ulcer but is requesting a referral to the wound center. She states the family is in agreement. Please advise if ok to refer?

## 2018-07-10 DIAGNOSIS — L89620 Pressure ulcer of left heel, unstageable: Secondary | ICD-10-CM | POA: Diagnosis not present

## 2018-07-10 DIAGNOSIS — G301 Alzheimer's disease with late onset: Secondary | ICD-10-CM | POA: Diagnosis not present

## 2018-07-10 DIAGNOSIS — F0280 Dementia in other diseases classified elsewhere without behavioral disturbance: Secondary | ICD-10-CM | POA: Diagnosis not present

## 2018-07-10 DIAGNOSIS — J4540 Moderate persistent asthma, uncomplicated: Secondary | ICD-10-CM | POA: Diagnosis not present

## 2018-07-10 DIAGNOSIS — L97523 Non-pressure chronic ulcer of other part of left foot with necrosis of muscle: Secondary | ICD-10-CM | POA: Diagnosis not present

## 2018-07-10 DIAGNOSIS — E11621 Type 2 diabetes mellitus with foot ulcer: Secondary | ICD-10-CM | POA: Diagnosis not present

## 2018-07-10 NOTE — Telephone Encounter (Signed)
error 

## 2018-07-14 ENCOUNTER — Ambulatory Visit (INDEPENDENT_AMBULATORY_CARE_PROVIDER_SITE_OTHER): Payer: Medicare Other

## 2018-07-14 ENCOUNTER — Ambulatory Visit (INDEPENDENT_AMBULATORY_CARE_PROVIDER_SITE_OTHER): Payer: Medicare Other | Admitting: Orthopedic Surgery

## 2018-07-14 ENCOUNTER — Encounter (INDEPENDENT_AMBULATORY_CARE_PROVIDER_SITE_OTHER): Payer: Self-pay | Admitting: Physician Assistant

## 2018-07-14 DIAGNOSIS — E11621 Type 2 diabetes mellitus with foot ulcer: Secondary | ICD-10-CM | POA: Diagnosis not present

## 2018-07-14 DIAGNOSIS — F0280 Dementia in other diseases classified elsewhere without behavioral disturbance: Secondary | ICD-10-CM | POA: Diagnosis not present

## 2018-07-14 DIAGNOSIS — L97422 Non-pressure chronic ulcer of left heel and midfoot with fat layer exposed: Secondary | ICD-10-CM

## 2018-07-14 DIAGNOSIS — L97523 Non-pressure chronic ulcer of other part of left foot with necrosis of muscle: Secondary | ICD-10-CM | POA: Diagnosis not present

## 2018-07-14 DIAGNOSIS — L89620 Pressure ulcer of left heel, unstageable: Secondary | ICD-10-CM | POA: Diagnosis not present

## 2018-07-14 DIAGNOSIS — G301 Alzheimer's disease with late onset: Secondary | ICD-10-CM | POA: Diagnosis not present

## 2018-07-14 DIAGNOSIS — I70211 Atherosclerosis of native arteries of extremities with intermittent claudication, right leg: Secondary | ICD-10-CM

## 2018-07-14 DIAGNOSIS — J4540 Moderate persistent asthma, uncomplicated: Secondary | ICD-10-CM | POA: Diagnosis not present

## 2018-07-14 DIAGNOSIS — L89622 Pressure ulcer of left heel, stage 2: Secondary | ICD-10-CM

## 2018-07-14 MED ORDER — MUPIROCIN 2 % EX OINT: 1.0000 "application " | TOPICAL_OINTMENT | Freq: Two times a day (BID) | CUTANEOUS | 3 refills | Status: DC

## 2018-07-14 MED ORDER — DOXYCYCLINE HYCLATE 100 MG PO TABS: 100.0000 mg | ORAL_TABLET | Freq: Two times a day (BID) | ORAL | 0 refills | Status: DC

## 2018-07-15 ENCOUNTER — Ambulatory Visit (INDEPENDENT_AMBULATORY_CARE_PROVIDER_SITE_OTHER): Payer: Self-pay | Admitting: Orthopedic Surgery

## 2018-07-16 ENCOUNTER — Ambulatory Visit: Payer: Self-pay | Admitting: Podiatry

## 2018-07-17 DIAGNOSIS — G301 Alzheimer's disease with late onset: Secondary | ICD-10-CM | POA: Diagnosis not present

## 2018-07-17 DIAGNOSIS — L97523 Non-pressure chronic ulcer of other part of left foot with necrosis of muscle: Secondary | ICD-10-CM | POA: Diagnosis not present

## 2018-07-17 DIAGNOSIS — J4540 Moderate persistent asthma, uncomplicated: Secondary | ICD-10-CM | POA: Diagnosis not present

## 2018-07-17 DIAGNOSIS — L89620 Pressure ulcer of left heel, unstageable: Secondary | ICD-10-CM | POA: Diagnosis not present

## 2018-07-17 DIAGNOSIS — E11621 Type 2 diabetes mellitus with foot ulcer: Secondary | ICD-10-CM | POA: Diagnosis not present

## 2018-07-17 DIAGNOSIS — F0280 Dementia in other diseases classified elsewhere without behavioral disturbance: Secondary | ICD-10-CM | POA: Diagnosis not present

## 2018-07-20 ENCOUNTER — Encounter (INDEPENDENT_AMBULATORY_CARE_PROVIDER_SITE_OTHER): Payer: Self-pay | Admitting: Orthopedic Surgery

## 2018-07-20 DIAGNOSIS — F0280 Dementia in other diseases classified elsewhere without behavioral disturbance: Secondary | ICD-10-CM | POA: Diagnosis not present

## 2018-07-20 DIAGNOSIS — G301 Alzheimer's disease with late onset: Secondary | ICD-10-CM | POA: Diagnosis not present

## 2018-07-20 DIAGNOSIS — L89620 Pressure ulcer of left heel, unstageable: Secondary | ICD-10-CM | POA: Diagnosis not present

## 2018-07-20 DIAGNOSIS — E11621 Type 2 diabetes mellitus with foot ulcer: Secondary | ICD-10-CM | POA: Diagnosis not present

## 2018-07-20 DIAGNOSIS — L97523 Non-pressure chronic ulcer of other part of left foot with necrosis of muscle: Secondary | ICD-10-CM | POA: Diagnosis not present

## 2018-07-20 DIAGNOSIS — J4540 Moderate persistent asthma, uncomplicated: Secondary | ICD-10-CM | POA: Diagnosis not present

## 2018-07-20 NOTE — Progress Notes (Signed)
Office Visit Note   Patient: [name]           Date of Birth: [birth year]           MRN: [id number] Visit Date: 07/14/2018              Requested by: Marin Olp, MD [place], [place] [address] PCP: Marin Olp, MD  Chief Complaint  Patient presents with  . Left Foot - Follow-up  . Left 2nd Toe - Edema, Wound Check      HPI: Patient is a 84 year old woman who presents follow-up for decubitus left heel ulcer currently wearing a PRAFO boot patient recently has avulsed the second toenail right foot.  Patient states that she does have an appointment with podiatry next week.  Patient is drinking protein supplements 3 times a day states she is lost 91 pounds over the past several months.  Assessment & Plan: Visit Diagnoses:  1. Diabetic ulcer of left heel associated with type 2 diabetes mellitus, with fat layer exposed ([place])   2. Pressure injury of left heel, stage 2 ([place])     Plan: Will start doxycycline and Bactroban prescriptions were called in.  Reevaluate in 3 weeks continue with the [facility] boot to unload pressure from her foot.  Follow-Up Instructions: Return in about 3 weeks (around 08/04/2018).   Ortho Exam  Patient is alert, oriented, no adenopathy, well-dressed, normal affect, normal respiratory effort. Examination patient has avulsion of the right second toenail there is no exposed bone no cellulitis no signs of infection.  There is no odor.  Patient's left heel decubitus ulcer is stable with no further breakdown no cellulitis no odor no drainage.  Imaging: No results found. No images are attached to the encounter.  Labs: Lab Results  Component Value Date   HGBA1C 7.2 (A) 05/01/2018   HGBA1C 7.1 (H) 12/31/2017   HGBA1C 7.0 07/23/2017   ESRSEDRATE 4 11/17/2014   REPTSTATUS 07/21/2009 FINAL 07/20/2009   REPTSTATUS 07/21/2009 FINAL 07/20/2009   CULT  07/20/2009    Multiple bacterial morphotypes present, none predominant. Suggest  appropriate recollection if clinically indicated.   LABORGA ESCHERICHIA COLI 08/13/2016     Lab Results  Component Value Date   ALBUMIN 3.4 (L) 05/01/2018   ALBUMIN 3.4 (L) 12/31/2017   ALBUMIN 3.6 07/23/2017    There is no height or weight on file to calculate BMI.  Orders:  Orders Placed This Encounter  Procedures  . XR Foot Complete Left   Meds ordered this encounter  Medications  . mupirocin ointment (BACTROBAN) 2 %    Sig: Apply 1 application topically 2 (two) times daily. Apply to the affected area 2 times a day    Dispense:  22 g    Refill:  3  . doxycycline (VIBRA-TABS) 100 MG tablet    Sig: Take 1 tablet (100 mg total) by mouth 2 (two) times daily.    Dispense:  60 tablet    Refill:  0     Procedures: No procedures performed  Clinical Data: No additional findings.  ROS:  All other systems negative, except as noted in the HPI. Review of Systems  Objective: Vital Signs: There were no vitals taken for this visit.  Specialty Comments:  No specialty comments available.  PMFS History: Patient Active Problem List   Diagnosis Date Noted  . Osteoporosis 07/02/2018  . Diabetic ulcer of foot with fat layer exposed ([place]) 05/14/2018  . GERD (gastroesophageal reflux disease) 05/02/2018  .  Generalized weakness 05/02/2018  . History of DVT in adulthood 06/26/2017  . LBBB (left bundle branch block) 10/09/2016  . Pleural effusion, left 09/23/2016  . Coronary artery calcification seen on CAT scan 08/28/2016  . Alzheimer's disease 01/04/2016  . Pancreas cyst 07/02/2015  . Asthma, chronic 03/16/2015  . Primary open angle glaucoma of left eye, moderate stage 12/25/2014  . Pseudophakia of both eyes 02/15/2013  . Anemia 05/25/2012  . Astigmatism 04/29/2012  . Allergic rhinitis 10/30/2010  . Venous (peripheral) insufficiency 04/13/2007  . Diabetes ([place]) 12/18/2006  . HYPERTENSION, BENIGN SYSTEMIC 12/18/2006  . Atherosclerosis of native arteries of extremity with  intermittent claudication ([place]) 12/18/2006  . OSTEOARTHRITIS OF SPINE, NOS 12/18/2006   Past Medical History:  Diagnosis Date  . Alzheimer's dementia without behavioral disturbance    diagnosed 12/2015; Dr. Jade/neurology  . Anemia   . Asthma   . Blood transfusion 05-24-12  . Blood transfusion without reported diagnosis   . Chronic kidney disease    dr Clover Mealy  . Diabetes mellitus   . Diverticulosis   . DVT (deep venous thrombosis) ([place]) 03/2005   hx.left leg  . Esophageal stricture   . GERD (gastroesophageal reflux disease)   . Glaucoma   . Hiatal hernia 05/07/13  . Hyperlipidemia   . Hypertension   . Osteoarthritis   . Pancreatic duct dilated 05/07/13  . Pulmonary nodules 05/07/13  . PVD (peripheral vascular disease) ([place])   . Tubular adenoma of colon     Family History  Adopted: Yes  Problem Relation Age of Onset  . Migraines Daughter   . Cancer Son   . Colon cancer Neg Hx     Past Surgical History:  Procedure Laterality Date  . CATARACT EXTRACTION, BILATERAL    . COLONOSCOPY  05/27/2012   Procedure: COLONOSCOPY;  Surgeon: Lafayette Dragon, MD;  Location: [facility] ENDOSCOPY;  Service: Endoscopy;  Laterality: N/A;  . ESOPHAGOGASTRODUODENOSCOPY  05/26/2012   Procedure: ESOPHAGOGASTRODUODENOSCOPY (EGD);  Surgeon: Lafayette Dragon, MD;  Location: [facility];  Service: Endoscopy;  Laterality: N/A;  . EUS N/A 07/08/2013   Procedure: UPPER ENDOSCOPIC ULTRASOUND (EUS) LINEAR;  Surgeon: Milus Banister, MD;  Location: [facility] ENDOSCOPY;  Service: Endoscopy;  Laterality: N/A;  need side view scope  . EYE SURGERY    . NM MYOVIEW LTD  10/2016   Read as intermediate risk due to EF of 38%.Anemia or infarction noted. Only abnormal septal WM.   Marland Kitchen TRANSTHORACIC ECHOCARDIOGRAM  10/2016    EF 45-50% with possible inferior hypokinesis. Aortic sclerosis without stenosis (ordered to reassess EF from Myoview)  . VASCULAR SURGERY  09/2004  . WRIST FRACTURE SURGERY     R wrist fracture, plate   Social History    Occupational History  . Not on file  Tobacco Use  . Smoking status: Never Smoker  . Smokeless tobacco: Never Used  Substance and Sexual Activity  . Alcohol use: No  . Drug use: No  . Sexual activity: Not Currently

## 2018-07-21 ENCOUNTER — Encounter: Payer: Self-pay | Admitting: Family Medicine

## 2018-07-21 ENCOUNTER — Ambulatory Visit (INDEPENDENT_AMBULATORY_CARE_PROVIDER_SITE_OTHER): Payer: Medicare Other

## 2018-07-21 ENCOUNTER — Ambulatory Visit (INDEPENDENT_AMBULATORY_CARE_PROVIDER_SITE_OTHER): Payer: Medicare Other | Admitting: Family Medicine

## 2018-07-21 VITALS — BP 124/60 | HR 76 | Temp 98.0°F | Ht 66.0 in | Wt 118.0 lb

## 2018-07-21 DIAGNOSIS — I700 Atherosclerosis of aorta: Secondary | ICD-10-CM | POA: Diagnosis not present

## 2018-07-21 DIAGNOSIS — L89620 Pressure ulcer of left heel, unstageable: Secondary | ICD-10-CM | POA: Diagnosis not present

## 2018-07-21 DIAGNOSIS — R05 Cough: Secondary | ICD-10-CM

## 2018-07-21 DIAGNOSIS — R059 Cough, unspecified: Secondary | ICD-10-CM

## 2018-07-21 DIAGNOSIS — J90 Pleural effusion, not elsewhere classified: Secondary | ICD-10-CM | POA: Diagnosis not present

## 2018-07-21 DIAGNOSIS — E11621 Type 2 diabetes mellitus with foot ulcer: Secondary | ICD-10-CM | POA: Diagnosis not present

## 2018-07-21 DIAGNOSIS — I70211 Atherosclerosis of native arteries of extremities with intermittent claudication, right leg: Secondary | ICD-10-CM | POA: Diagnosis not present

## 2018-07-21 DIAGNOSIS — E441 Mild protein-calorie malnutrition: Secondary | ICD-10-CM | POA: Diagnosis not present

## 2018-07-21 DIAGNOSIS — J439 Emphysema, unspecified: Secondary | ICD-10-CM | POA: Diagnosis not present

## 2018-07-21 DIAGNOSIS — E46 Unspecified protein-calorie malnutrition: Secondary | ICD-10-CM | POA: Insufficient documentation

## 2018-07-21 DIAGNOSIS — E119 Type 2 diabetes mellitus without complications: Secondary | ICD-10-CM

## 2018-07-21 DIAGNOSIS — J4541 Moderate persistent asthma with (acute) exacerbation: Secondary | ICD-10-CM | POA: Diagnosis not present

## 2018-07-21 DIAGNOSIS — L97523 Non-pressure chronic ulcer of other part of left foot with necrosis of muscle: Secondary | ICD-10-CM | POA: Diagnosis not present

## 2018-07-21 DIAGNOSIS — G301 Alzheimer's disease with late onset: Secondary | ICD-10-CM | POA: Diagnosis not present

## 2018-07-21 DIAGNOSIS — F0280 Dementia in other diseases classified elsewhere without behavioral disturbance: Secondary | ICD-10-CM | POA: Diagnosis not present

## 2018-07-21 DIAGNOSIS — J4540 Moderate persistent asthma, uncomplicated: Secondary | ICD-10-CM | POA: Diagnosis not present

## 2018-07-21 MED ORDER — METHYLPREDNISOLONE ACETATE 40 MG/ML IJ SUSP
40.0000 mg | Freq: Once | INTRAMUSCULAR | Status: AC
  Administered 2018-07-21: 40 mg via INTRAMUSCULAR

## 2018-07-21 NOTE — Progress Notes (Signed)
Subjective:  Lawrie Tunks is a 84 y.o. year old very pleasant female patient who presents for/with See problem oriented charting ROS- has cough, congestion. No fever reported. Has wheeze and more SOB than has been recently. Continued weight loss.    Past Medical History-  Patient Active Problem List   Diagnosis Date Noted  . History of DVT in adulthood 06/26/2017    Priority: High  . Coronary artery calcification seen on CAT scan 08/28/2016    Priority: High  . Alzheimer's disease ([place]) 01/04/2016    Priority: High  . Diabetes ([place]) 12/18/2006    Priority: High  . Atherosclerosis of native arteries of extremity with intermittent claudication ([place]) 12/18/2006    Priority: High  . Generalized weakness 05/02/2018    Priority: Medium  . Pancreas cyst 07/02/2015    Priority: Medium  . Asthma, chronic 03/16/2015    Priority: Medium  . Primary open angle glaucoma of left eye, moderate stage 12/25/2014    Priority: Medium  . HYPERTENSION, BENIGN SYSTEMIC 12/18/2006    Priority: Medium  . GERD (gastroesophageal reflux disease) 05/02/2018    Priority: Low  . LBBB (left bundle branch block) 10/09/2016    Priority: Low  . Pleural effusion, left 09/23/2016    Priority: Low  . Pseudophakia of both eyes 02/15/2013    Priority: Low  . Anemia 05/25/2012    Priority: Low  . Astigmatism 04/29/2012    Priority: Low  . Allergic rhinitis 10/30/2010    Priority: Low  . Venous (peripheral) insufficiency 04/13/2007    Priority: Low  . OSTEOARTHRITIS OF SPINE, NOS 12/18/2006    Priority: Low  . Osteoporosis 07/02/2018  . Diabetic ulcer of foot with fat layer exposed ([place]) 05/14/2018    Medications- reviewed and updated Current Outpatient Medications  Medication Sig Dispense Refill  . albuterol (PROAIR HFA) 108 (90 Base) MCG/ACT inhaler 2 PUFFS EVERY 6 HOURS AS NEEDED FOR SHORTNESS OF BREATH. 17 g 5  . albuterol (PROVENTIL) (2.5 MG/3ML) 0.083% nebulizer solution USE THREE MILLILITERS VIA  NEBULIZATION BY MOUTH EVERY 6 HOURS AS NEEDED FOR WHEEZING OR SHORTNESS OF BREATH 75 mL 4  . Ascorbic Acid (VITAMIN C) 1000 MG tablet Take 1,000 mg by mouth daily.    Marland Kitchen aspirin 81 MG tablet Take 81 mg by mouth daily.    Marland Kitchen azelastine (ASTELIN) 0.1 % nasal spray Place 1 spray into both nostrils 2 (two) times daily. Use in each nostril as directed 30 mL 11  . brimonidine (ALPHAGAN P) 0.1 % SOLN INSTILL 1 DROP IN BOTH EYE TWICE DAILY.    . budesonide (PULMICORT) 0.25 MG/2ML nebulizer solution Take 2 mLs (0.25 mg total) daily by nebulization. ICD10 Code-J45.909 60 mL 12  . calcium carbonate (OS-CAL) 600 MG TABS Take 600 mg by mouth 2 (two) times daily with a meal.    . citalopram (CELEXA) 10 MG tablet Take 1 tablet (10 mg total) daily by mouth. 90 tablet 1  . donepezil (ARICEPT) 10 MG tablet Take 1 tablet (10 mg total) by mouth at bedtime. 90 tablet 3  . doxycycline (VIBRA-TABS) 100 MG tablet Take 1 tablet (100 mg total) by mouth 2 (two) times daily. 60 tablet 0  . famotidine (PEPCID) 20 MG tablet Take 1 tablet by mouth at bedtime.    . IRON PO Take 1 day or 1 dose by mouth.    . memantine (NAMENDA) 5 MG tablet Take 1 tablet (5 mg total) by mouth 2 (two) times daily. 180 tablet 3  .  metFORMIN (GLUCOPHAGE) 500 MG tablet TAKE 1 TABLET ONCE DAILY WITH SUPPER. 90 tablet 3  . Multiple Vitamin (MULTIVITAMIN WITH MINERALS) TABS tablet Take 1 tablet by mouth daily.    . mupirocin ointment (BACTROBAN) 2 % Apply 1 application topically 2 (two) times daily. Apply to the affected area 2 times a day 22 g 3   Objective: BP 124/60 (BP Location: Left Arm, Patient Position: Sitting, Cuff Size: Normal)   Pulse 76   Temp 98 F (36.7 C) (Oral)   Ht 5\' 6"  (1.676 m)   Wt 118 lb (53.5 kg)   LMP  (LMP Unknown)   SpO2 94%   BMI 19.05 kg/m  Gen:  very thin, loss of subcutaenous fat in cheeks Slightly dry mucus membranes CV: RRR no murmurs rubs or gallops Lungs: respiratory rate over 20, lungs with diffuse mild  wheeze but no crackles, rhonchi Abdomen: soft/nontender/nondistended/normal bowel sounds. Very thin Ext: no edema Skin: warm, dry  Weight down 4 lbs in since 07/02/18  Assessment/Plan:  Cough, congestion, wheeze, shortness of breath S: Patient presents because home health told her she had "fluid in her lungs" particularly in left lower lobe. Patient states both he and husband have been sick. Family helps with details due to dementia. She has a hacking cough and trouble with wheezing. Cough started 6 days ago. Seems to do better propped up in recliner at night. She is using pulmicort for known asthma- but family not sure she has been using consistnetly. Wheezing more at home. Coughing up yellow sputum for 2 days. No sinus pressure.  We went back and reviewed prior x-rays. Appears patient has had a small to moderate left pleural effusion. Looking back at CT [facility] 08/28/16 noted "Largely stable numerous bilateral soft tissue nodules, thought to represent inflammatory nodules. Slow growing metastatic disease may have a similar appearance." They did note moderate plerual effusion at that time. First note of pleural effusion was 07/29/16 when it was noted as new. It appears follow up chest CT was ordered but never completed. While reviewing prior scans noted aortic atherosclerosis.  A/P: Patient with history of asthma- supposed to be on pulmicort twice a day but does not appear compliant. Encouraged regular pulmicort. Cannot swllow pills - so did not send in prednisone- instead depo medrol 40mg  IM given today. Advised albuterol q4-6 hours for next 24 hours. I asked them to call me if she is not improving within 48 hours- may add additional antibiotic. She is on doxycycline for her toe  Already which gives some respiratory coverage.   We did an x-ray today. Pending radiology read- I independently reviewed this and appears to me that left pleural effusion is smaller. She does appear to have some thickened  interstitial markings particularly centrally concerning for bronchitis. I do not see obvious consolidation/pneumonia.   She could have bronchitis or simply asthma flare. I think depo medrol plus continued doxycycline is a reasonable first step. We discussed if radiology thought there was fluid overload could also consider furosemide.   Patient certainly with protein calorie malnutrition- with her getting sick this has worsened patient should continue to be offered food. I would consider CT chest with weight loss and pulmonary issues as well as prior CT results (mention of possible malignancy). We will need to reassess this when outside of acute illness- hoping she can at least maintain or regain prior weight loss- if not may need to consider more aggressive cancer evaluation potentially. Prior plan for this in discussions with  family was to workup only if worsening shortness of breath.    Aortic atherosclerosis noted on prior x-ray/scans as above- will continue risk factor modification.   A1c is controlled in regards to diabetes and suspect she can tolerate the steroid injection  Time Stamp The duration of face-to-face time during this visit was greater than 40 minutes (4:16-4:56 PM). Greater than 50% of this time was spent in counseling, explanation of diagnosis, planning of further management, and/or coordination of care including -->We spent extended time going over home controllers vs. Rescue inhalers- working between patient, daughter, and spouse to get regimen correct. Patient also required frequent redirection due to dementia as well as advisement to avoid inappropriate remarks (such as saying "im cold, boy come sit in my lap" to me). We also discussed potential next steps depending on results of x-ray (further imaging, diuretics, continue current plan, add antibiotic)  Future Appointments  Date Time Provider [place]  07/24/2018 10:30 AM Evelina Bucy, DPM [facility]   08/04/2018  1:45 PM Newt Minion, MD [facility] None  08/11/2018 10:30 AM Pieter Partridge, DO [facility] None  10/01/2018  2:30 PM Marin Olp, MD [facility]   Lab/Order associations: Cough - Plan: DG Chest 2 View, methylPREDNISolone acetate (DEPO-MEDROL) injection 40 mg  Pleural effusion, left - Plan: DG Chest 2 View  Meds ordered this encounter  Medications  . methylPREDNISolone acetate (DEPO-MEDROL) injection 40 mg    Return precautions advised.  Garret Reddish, MD

## 2018-07-21 NOTE — Patient Instructions (Addendum)
Steroid shot today since you cant take prednisone pills and I dont think you can crush them.   Give me an update in 24-48 hours- if she is not improving may add antibiotic.  Possible fluid on both lungs- may need to send in Fluid pill depending on radiology report  If new or worsening symptoms- please contact us immediately. Use pulmicort/budesonide twice a day everyday. Also for next 24 hours try to use albuterol inhaler or nebulizer every 4-6 hours at least while awake.

## 2018-07-24 ENCOUNTER — Ambulatory Visit (INDEPENDENT_AMBULATORY_CARE_PROVIDER_SITE_OTHER): Payer: Medicare Other | Admitting: Podiatry

## 2018-07-24 ENCOUNTER — Ambulatory Visit: Payer: Medicare Other | Admitting: Family Medicine

## 2018-07-24 VITALS — BP 118/59 | HR 64

## 2018-07-24 DIAGNOSIS — E11621 Type 2 diabetes mellitus with foot ulcer: Secondary | ICD-10-CM | POA: Diagnosis not present

## 2018-07-24 DIAGNOSIS — L97421 Non-pressure chronic ulcer of left heel and midfoot limited to breakdown of skin: Secondary | ICD-10-CM

## 2018-07-24 DIAGNOSIS — E1142 Type 2 diabetes mellitus with diabetic polyneuropathy: Secondary | ICD-10-CM

## 2018-07-24 DIAGNOSIS — E119 Type 2 diabetes mellitus without complications: Secondary | ICD-10-CM | POA: Diagnosis not present

## 2018-07-24 DIAGNOSIS — B351 Tinea unguium: Secondary | ICD-10-CM | POA: Diagnosis not present

## 2018-07-24 DIAGNOSIS — L97521 Non-pressure chronic ulcer of other part of left foot limited to breakdown of skin: Secondary | ICD-10-CM

## 2018-07-24 NOTE — Progress Notes (Signed)
Subjective:  Patient ID: [name], female    DOB: 10-May-1931,  MRN: [id number]  Chief Complaint  Patient presents with  . Nail Problem    PT Toenails are thick and yellow and cannot be trimmed themselves.   . Foot Problem    Left foot ulcers that are being treated by Dr. Sharol Given    84 y.o. female presents  for diabetic foot care. Last AMBS was unknown.  Last A1c also unknown.  Type II diabetic.  Complains of thickened yellow discolored nails to both feet.  Reports a history of ulcers to left foot that are being treated by Dr. Sharol Given.  States that the second toe was followed up.  PCP Dr. Garret Reddish.. Reports numbness and tingling in their feet. Denies cramping in legs and thighs.  Review of Systems: Negative except as noted in the HPI. Denies N/V/F/Ch.  Past Medical History:  Diagnosis Date  . Alzheimer's dementia without behavioral disturbance ([place])    diagnosed 12/2015; Dr. Jade/neurology  . Anemia   . Asthma   . Blood transfusion 05-24-12  . Blood transfusion without reported diagnosis   . Chronic kidney disease    dr Clover Mealy  . Diabetes mellitus   . Diverticulosis   . DVT (deep venous thrombosis) ([place]) 03/2005   hx.left leg  . Esophageal stricture   . GERD (gastroesophageal reflux disease)   . Glaucoma   . Hiatal hernia 05/07/13  . Hyperlipidemia   . Hypertension   . Osteoarthritis   . Pancreatic duct dilated 05/07/13  . Pulmonary nodules 05/07/13  . PVD (peripheral vascular disease) ([place])   . Tubular adenoma of colon     Current Outpatient Medications:  .  levothyroxine (SYNTHROID, LEVOTHROID) 25 MCG tablet, Take by mouth., Disp: , Rfl:  .  albuterol (PROAIR HFA) 108 (90 Base) MCG/ACT inhaler, 2 PUFFS EVERY 6 HOURS AS NEEDED FOR SHORTNESS OF BREATH., Disp: 17 g, Rfl: 5 .  albuterol (PROVENTIL) (2.5 MG/3ML) 0.083% nebulizer solution, USE THREE MILLILITERS VIA NEBULIZATION BY MOUTH EVERY 6 HOURS AS NEEDED FOR WHEEZING OR SHORTNESS OF BREATH, Disp: 75 mL, Rfl: 4 .   Ascorbic Acid (VITAMIN C) 1000 MG tablet, Take 1,000 mg by mouth daily., Disp: , Rfl:  .  aspirin 81 MG tablet, Take 81 mg by mouth daily., Disp: , Rfl:  .  azelastine (ASTELIN) 0.1 % nasal spray, Place 1 spray into both nostrils 2 (two) times daily. Use in each nostril as directed, Disp: 30 mL, Rfl: 11 .  brimonidine (ALPHAGAN P) 0.1 % SOLN, INSTILL 1 DROP IN BOTH EYE TWICE DAILY., Disp: , Rfl:  .  budesonide (PULMICORT) 0.25 MG/2ML nebulizer solution, Take 2 mLs (0.25 mg total) daily by nebulization. ICD10 Code-J45.909, Disp: 60 mL, Rfl: 12 .  calcium carbonate (OS-CAL) 600 MG TABS, Take 600 mg by mouth 2 (two) times daily with a meal., Disp: , Rfl:  .  citalopram (CELEXA) 10 MG tablet, Take 1 tablet (10 mg total) daily by mouth., Disp: 90 tablet, Rfl: 1 .  donepezil (ARICEPT) 10 MG tablet, Take 1 tablet (10 mg total) by mouth at bedtime., Disp: 90 tablet, Rfl: 3 .  doxycycline (VIBRA-TABS) 100 MG tablet, Take 1 tablet (100 mg total) by mouth 2 (two) times daily., Disp: 60 tablet, Rfl: 0 .  famotidine (PEPCID) 20 MG tablet, Take 1 tablet by mouth at bedtime., Disp: , Rfl:  .  IRON PO, Take 1 day or 1 dose by mouth., Disp: , Rfl:  .  memantine (NAMENDA)  5 MG tablet, Take 1 tablet (5 mg total) by mouth 2 (two) times daily., Disp: 180 tablet, Rfl: 3 .  metFORMIN (GLUCOPHAGE) 500 MG tablet, TAKE 1 TABLET ONCE DAILY WITH SUPPER., Disp: 90 tablet, Rfl: 3 .  Multiple Vitamin (MULTIVITAMIN WITH MINERALS) TABS tablet, Take 1 tablet by mouth daily., Disp: , Rfl:  .  mupirocin ointment (BACTROBAN) 2 %, Apply 1 application topically 2 (two) times daily. Apply to the affected area 2 times a day, Disp: 22 g, Rfl: 3  Social History   Tobacco Use  Smoking Status Never Smoker  Smokeless Tobacco Never Used    No Known Allergies Objective:   Vitals:   07/24/18 1100  BP: (!) 118/59  Pulse: 64   There is no height or weight on file to calculate BMI. Constitutional Well developed. Well nourished.    Vascular Dorsalis pedis pulses present 1+ bilaterally  Posterior tibial pulses absent bilaterally  Pedal hair growth absent. Capillary refill normal to all digits.  No cyanosis or clubbing noted.  Neurologic Normal speech. Oriented to person, place, and time. Epicritic sensation to light touch grossly present bilaterally. Vibratory sensation absent bilaterally.  Dermatologic Nails elongated, thickened, dystrophic.  Ulcers present to the left second toe left sub-met 1 and left heel all with relatively granular base without active drainage or signs of acute infection.  Orthopedic: Normal joint ROM without pain or crepitus bilaterally. No visible deformities. No bony tenderness.   Assessment:   1. DM type 2 with diabetic peripheral neuropathy ([place])   2. Onychomycosis   3. Diabetic ulcer of left midfoot associated with type 2 diabetes mellitus, limited to breakdown of skin ([place])   4. Diabetic ulcer of toe of left foot associated with type 2 diabetes mellitus, limited to breakdown of skin ([place])   5. Diabetic ulcer of left heel associated with type 2 diabetes mellitus, limited to breakdown of skin ([place])   6. Encounter for diabetic foot exam [facility])    Plan:  Patient was evaluated and treated and all questions answered.  Diabetes with diabetic peripheral neuropathy and history of ulceration with active ulceration, Onychomycosis -Educated on diabetic footcare. Diabetic risk level 3 -Nails x10 debrided sharply and manually with large nail nipper and rotary burr.  -Once ulcers have further progressed would likely benefit from diabetic shoes  Procedure: Nail Debridement Rationale: Patient meets criteria for routine foot care due to DPN Type of Debridement: manual, sharp debridement. Instrumentation: Nail nipper, rotary burr. Number of Nails: 10  Left foot ulcerations -Dressed with Silvadene and DSD.  Defer care to Dr. Sharol Given.  Return in about 3 months (around 10/24/2018) for Diabetic Foot  Care.

## 2018-07-31 DIAGNOSIS — L97523 Non-pressure chronic ulcer of other part of left foot with necrosis of muscle: Secondary | ICD-10-CM | POA: Diagnosis not present

## 2018-07-31 DIAGNOSIS — E11621 Type 2 diabetes mellitus with foot ulcer: Secondary | ICD-10-CM | POA: Diagnosis not present

## 2018-07-31 DIAGNOSIS — L89620 Pressure ulcer of left heel, unstageable: Secondary | ICD-10-CM | POA: Diagnosis not present

## 2018-07-31 DIAGNOSIS — J4540 Moderate persistent asthma, uncomplicated: Secondary | ICD-10-CM | POA: Diagnosis not present

## 2018-07-31 DIAGNOSIS — F0280 Dementia in other diseases classified elsewhere without behavioral disturbance: Secondary | ICD-10-CM | POA: Diagnosis not present

## 2018-07-31 DIAGNOSIS — G301 Alzheimer's disease with late onset: Secondary | ICD-10-CM | POA: Diagnosis not present

## 2018-08-02 ENCOUNTER — Other Ambulatory Visit: Payer: Self-pay | Admitting: Family Medicine

## 2018-08-03 DIAGNOSIS — J4540 Moderate persistent asthma, uncomplicated: Secondary | ICD-10-CM | POA: Diagnosis not present

## 2018-08-03 DIAGNOSIS — E11621 Type 2 diabetes mellitus with foot ulcer: Secondary | ICD-10-CM | POA: Diagnosis not present

## 2018-08-03 DIAGNOSIS — F0280 Dementia in other diseases classified elsewhere without behavioral disturbance: Secondary | ICD-10-CM | POA: Diagnosis not present

## 2018-08-03 DIAGNOSIS — L97523 Non-pressure chronic ulcer of other part of left foot with necrosis of muscle: Secondary | ICD-10-CM | POA: Diagnosis not present

## 2018-08-03 DIAGNOSIS — G301 Alzheimer's disease with late onset: Secondary | ICD-10-CM | POA: Diagnosis not present

## 2018-08-03 DIAGNOSIS — L89620 Pressure ulcer of left heel, unstageable: Secondary | ICD-10-CM | POA: Diagnosis not present

## 2018-08-04 ENCOUNTER — Ambulatory Visit (INDEPENDENT_AMBULATORY_CARE_PROVIDER_SITE_OTHER): Payer: Medicare Other | Admitting: Orthopedic Surgery

## 2018-08-04 ENCOUNTER — Other Ambulatory Visit: Payer: Self-pay | Admitting: Family Medicine

## 2018-08-04 ENCOUNTER — Encounter (INDEPENDENT_AMBULATORY_CARE_PROVIDER_SITE_OTHER): Payer: Self-pay | Admitting: Orthopedic Surgery

## 2018-08-04 VITALS — Ht 66.0 in | Wt 118.0 lb

## 2018-08-04 DIAGNOSIS — L89620 Pressure ulcer of left heel, unstageable: Secondary | ICD-10-CM | POA: Diagnosis not present

## 2018-08-04 DIAGNOSIS — I70211 Atherosclerosis of native arteries of extremities with intermittent claudication, right leg: Secondary | ICD-10-CM | POA: Diagnosis not present

## 2018-08-04 DIAGNOSIS — L97523 Non-pressure chronic ulcer of other part of left foot with necrosis of muscle: Secondary | ICD-10-CM | POA: Diagnosis not present

## 2018-08-04 DIAGNOSIS — G301 Alzheimer's disease with late onset: Secondary | ICD-10-CM | POA: Diagnosis not present

## 2018-08-04 DIAGNOSIS — E11621 Type 2 diabetes mellitus with foot ulcer: Secondary | ICD-10-CM

## 2018-08-04 DIAGNOSIS — L97422 Non-pressure chronic ulcer of left heel and midfoot with fat layer exposed: Secondary | ICD-10-CM | POA: Diagnosis not present

## 2018-08-04 DIAGNOSIS — F0280 Dementia in other diseases classified elsewhere without behavioral disturbance: Secondary | ICD-10-CM | POA: Diagnosis not present

## 2018-08-04 DIAGNOSIS — J4540 Moderate persistent asthma, uncomplicated: Secondary | ICD-10-CM | POA: Diagnosis not present

## 2018-08-04 NOTE — Progress Notes (Signed)
Office Visit Note   Patient: [name]           Date of Birth: Aug 20, 1931           MRN: [id number] Visit Date: 08/04/2018              Requested by: Marin Olp, MD [place], [place] [address] PCP: Marin Olp, MD  Chief Complaint  Patient presents with  . Left Foot - Follow-up, Wound Check      HPI: Patient is an 84 year old woman who presents in follow-up for multiple ulcers to the left lower extremity she has had a heel ulcer and wearing a PRAFO she has had an ulcer beneath the great toe first metatarsal head as well as an ulcer over the dorsum of the second toe.  Patient has completed a course of doxycycline and is used Bactroban over the wound she is currently wearing a PRAFO.  Patient has a new ischemic ulcer dorsally over the left foot.  Patient denies any pain.  Assessment & Plan: Visit Diagnoses:  1. Diabetic ulcer of left heel associated with type 2 diabetes mellitus, with fat layer exposed ([place])     Plan: Patient is showing improvement with current wound care she will continue wearing a PRAFO continue with Silvadene dressing changes.  Follow-Up Instructions: Return in about 4 weeks (around 09/01/2018).   Ortho Exam  Patient is alert, oriented, no adenopathy, well-dressed, normal affect, normal respiratory effort. Examination patient's decubitus heel ulcer is showing excellent improvement is currently about 5 mm in diameter with good epithelization around the wound edges.  The ulcer over the dorsum of the second toe is also almost completely healed and this has good epithelialization as well.  She has a new partial-thickness ischemic ulcer of the dorsum of her foot which is 5 mm in diameter.  She does have a insensate ulcer beneath the first metatarsal head left foot.  After informed consent a 10 blade knife was used to debride the skin and soft tissue back to healthy viable tissue the ulcer is 2 cm in diameter 1 mm deep.  A Band-Aid was  applied.  Imaging: No results found. No images are attached to the encounter.  Labs: Lab Results  Component Value Date   HGBA1C 7.2 (A) 05/01/2018   HGBA1C 7.1 (H) 12/31/2017   HGBA1C 7.0 07/23/2017   ESRSEDRATE 4 11/17/2014   REPTSTATUS 07/21/2009 FINAL 07/20/2009   REPTSTATUS 07/21/2009 FINAL 07/20/2009   CULT  07/20/2009    Multiple bacterial morphotypes present, none predominant. Suggest appropriate recollection if clinically indicated.   LABORGA ESCHERICHIA COLI 08/13/2016     Lab Results  Component Value Date   ALBUMIN 3.4 (L) 05/01/2018   ALBUMIN 3.4 (L) 12/31/2017   ALBUMIN 3.6 07/23/2017    Body mass index is 19.05 kg/m.  Orders:  No orders of the defined types were placed in this encounter.  No orders of the defined types were placed in this encounter.    Procedures: No procedures performed  Clinical Data: No additional findings.  ROS:  All other systems negative, except as noted in the HPI. Review of Systems  Objective: Vital Signs: Ht 5\' 6"  (1.676 m)   Wt 118 lb (53.5 kg)   LMP  (LMP Unknown)   BMI 19.05 kg/m   Specialty Comments:  No specialty comments available.  PMFS History: Patient Active Problem List   Diagnosis Date Noted  . Protein-calorie malnutrition ([place]) 07/21/2018  . Osteoporosis 07/02/2018  .  Diabetic ulcer of foot with fat layer exposed ([place]) 05/14/2018  . GERD (gastroesophageal reflux disease) 05/02/2018  . Generalized weakness 05/02/2018  . History of DVT in adulthood 06/26/2017  . LBBB (left bundle branch block) 10/09/2016  . Pleural effusion, left 09/23/2016  . Coronary artery calcification seen on CAT scan 08/28/2016  . Alzheimer's disease ([place]) 01/04/2016  . Pancreas cyst 07/02/2015  . Asthma, chronic 03/16/2015  . Primary open angle glaucoma of left eye, moderate stage 12/25/2014  . Pseudophakia of both eyes 02/15/2013  . Anemia 05/25/2012  . Astigmatism 04/29/2012  . Allergic rhinitis 10/30/2010  . Venous  (peripheral) insufficiency 04/13/2007  . Diabetes ([place]) 12/18/2006  . HYPERTENSION, BENIGN SYSTEMIC 12/18/2006  . Atherosclerosis of native arteries of extremity with intermittent claudication ([place]) 12/18/2006  . OSTEOARTHRITIS OF SPINE, NOS 12/18/2006   Past Medical History:  Diagnosis Date  . Alzheimer's dementia without behavioral disturbance ([place])    diagnosed 12/2015; Dr. Jade/neurology  . Anemia   . Asthma   . Blood transfusion 05-24-12  . Blood transfusion without reported diagnosis   . Chronic kidney disease    dr Clover Mealy  . Diabetes mellitus   . Diverticulosis   . DVT (deep venous thrombosis) ([place]) 03/2005   hx.left leg  . Esophageal stricture   . GERD (gastroesophageal reflux disease)   . Glaucoma   . Hiatal hernia 05/07/13  . Hyperlipidemia   . Hypertension   . Osteoarthritis   . Pancreatic duct dilated 05/07/13  . Pulmonary nodules 05/07/13  . PVD (peripheral vascular disease) ([place])   . Tubular adenoma of colon     Family History  Adopted: Yes  Problem Relation Age of Onset  . Migraines Daughter   . Cancer Son   . Colon cancer Neg Hx     Past Surgical History:  Procedure Laterality Date  . CATARACT EXTRACTION, BILATERAL    . COLONOSCOPY  05/27/2012   Procedure: COLONOSCOPY;  Surgeon: Lafayette Dragon, MD;  Location: [facility] ENDOSCOPY;  Service: Endoscopy;  Laterality: N/A;  . ESOPHAGOGASTRODUODENOSCOPY  05/26/2012   Procedure: ESOPHAGOGASTRODUODENOSCOPY (EGD);  Surgeon: Lafayette Dragon, MD;  Location: [facility];  Service: Endoscopy;  Laterality: N/A;  . EUS N/A 07/08/2013   Procedure: UPPER ENDOSCOPIC ULTRASOUND (EUS) LINEAR;  Surgeon: Milus Banister, MD;  Location: [facility] ENDOSCOPY;  Service: Endoscopy;  Laterality: N/A;  need side view scope  . EYE SURGERY    . NM MYOVIEW LTD  10/2016   Read as intermediate risk due to EF of 38%.Anemia or infarction noted. Only abnormal septal WM.   Marland Kitchen TRANSTHORACIC ECHOCARDIOGRAM  10/2016    EF 45-50% with possible inferior hypokinesis.  Aortic sclerosis without stenosis (ordered to reassess EF from Myoview)  . VASCULAR SURGERY  09/2004  . WRIST FRACTURE SURGERY     R wrist fracture, plate   Social History   Occupational History  . Not on file  Tobacco Use  . Smoking status: Never Smoker  . Smokeless tobacco: Never Used  Substance and Sexual Activity  . Alcohol use: No  . Drug use: No  . Sexual activity: Not Currently

## 2018-08-05 NOTE — Telephone Encounter (Signed)
Patient is requesting a refill of the following medications: Requested Prescriptions   Pending Prescriptions Disp Refills  . citalopram (CELEXA) 10 MG tablet [Pharmacy Med Name: CITALOPRAM HBR 10 MG TABLET] 90 tablet 0    Sig: TAKE 1 TABLET ONCE DAILY.    Date of patient request: 08/05/2018 Last office visit: 12/31/17 Date of last refill: 09/03/17 Last refill amount: #90 with 1 refill Follow up time period per chart: Return in about 4 months (around 05/02/2018) for follow-up chronic medical conditions.

## 2018-08-10 ENCOUNTER — Other Ambulatory Visit: Payer: Self-pay | Admitting: Family Medicine

## 2018-08-10 ENCOUNTER — Telehealth: Payer: Self-pay | Admitting: Family Medicine

## 2018-08-10 DIAGNOSIS — L97523 Non-pressure chronic ulcer of other part of left foot with necrosis of muscle: Secondary | ICD-10-CM | POA: Diagnosis not present

## 2018-08-10 DIAGNOSIS — F0280 Dementia in other diseases classified elsewhere without behavioral disturbance: Secondary | ICD-10-CM | POA: Diagnosis not present

## 2018-08-10 DIAGNOSIS — J4540 Moderate persistent asthma, uncomplicated: Secondary | ICD-10-CM | POA: Diagnosis not present

## 2018-08-10 DIAGNOSIS — L89620 Pressure ulcer of left heel, unstageable: Secondary | ICD-10-CM | POA: Diagnosis not present

## 2018-08-10 DIAGNOSIS — G301 Alzheimer's disease with late onset: Secondary | ICD-10-CM | POA: Diagnosis not present

## 2018-08-10 DIAGNOSIS — E11621 Type 2 diabetes mellitus with foot ulcer: Secondary | ICD-10-CM | POA: Diagnosis not present
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
## 2018-08-10 NOTE — Telephone Encounter (Signed)
See note

## 2018-08-10 NOTE — Telephone Encounter (Unsigned)
Copied from [place] [id number]. Topic: Quick Communication - Home Health Verbal Orders >> Aug 10, 2018  2:52 PM Carolyn Stare wrote: Caller/Agency  [facility]   Callback Number [phone]     Requesting  orders for pressure wound that was a stage 1 now it is 2 open areas that are stage 2   [facility]   Request refill citalopram  10mg 

## 2018-08-10 NOTE — Telephone Encounter (Signed)
Spoke with nurse Patty and provided verbal order for wound dressing. SHe is going to apply Med Honey and a dry dressing with daily dressing changes.  Patient is requesting refill on citalopram but Dr. Yong Channel has never prescribed this for the patient and this was a prescription form over a year ago from a Dr at [facility]. Advised patient would need an appointment to discuss with Dr. Yong Channel

## 2018-08-11 ENCOUNTER — Encounter: Payer: Self-pay | Admitting: Neurology

## 2018-08-11 ENCOUNTER — Ambulatory Visit (INDEPENDENT_AMBULATORY_CARE_PROVIDER_SITE_OTHER): Payer: Medicare Other | Admitting: Neurology

## 2018-08-11 VITALS — BP 108/54 | HR 72 | Ht 66.0 in | Wt 118.0 lb

## 2018-08-11 DIAGNOSIS — I70211 Atherosclerosis of native arteries of extremities with intermittent claudication, right leg: Secondary | ICD-10-CM | POA: Diagnosis not present

## 2018-08-11 DIAGNOSIS — G301 Alzheimer's disease with late onset: Secondary | ICD-10-CM | POA: Diagnosis not present

## 2018-08-11 DIAGNOSIS — F0281 Dementia in other diseases classified elsewhere with behavioral disturbance: Secondary | ICD-10-CM | POA: Diagnosis not present

## 2018-08-11 DIAGNOSIS — F02818 Dementia in other diseases classified elsewhere, unspecified severity, with other behavioral disturbance: Secondary | ICD-10-CM

## 2018-08-11 MED ORDER — MEMANTINE HCL 10 MG PO TABS: 10.0000 mg | ORAL_TABLET | Freq: Two times a day (BID) | ORAL | 5 refills | Status: DC

## 2018-08-11 NOTE — Patient Instructions (Signed)
1.  Increase memantine to 10mg  twice daily 2.  Continue donepezil 10mg  at bedtime 3.  Recommend 24 hour supervision.   RESOURCES: Development worker, community of [facility]: [phone]  Tel [place]:  [phone]  www.senior-resources-[place].org/resources.cfm   Resources for common questions found under "Pathways & Protocols "  www.senior-resources-[place].org/pathways/Pathways_Menu.htm   Resources for [place] [facility] and Assisted Living facilities:  www.ncnursinghomeguide.com   For assistance with senior care, elder law, and estate planning (POA, medical directives):  [employer]  [address], [place] [address]  Tel: [phone]  [email or web address]   Berneice Heinrich  Tel: [phone]  [email or web address]

## 2018-08-17 DIAGNOSIS — L89620 Pressure ulcer of left heel, unstageable: Secondary | ICD-10-CM | POA: Diagnosis not present

## 2018-08-17 DIAGNOSIS — E11621 Type 2 diabetes mellitus with foot ulcer: Secondary | ICD-10-CM | POA: Diagnosis not present

## 2018-08-17 DIAGNOSIS — G301 Alzheimer's disease with late onset: Secondary | ICD-10-CM | POA: Diagnosis not present

## 2018-08-17 DIAGNOSIS — J4540 Moderate persistent asthma, uncomplicated: Secondary | ICD-10-CM | POA: Diagnosis not present

## 2018-08-17 DIAGNOSIS — L97523 Non-pressure chronic ulcer of other part of left foot with necrosis of muscle: Secondary | ICD-10-CM | POA: Diagnosis not present

## 2018-08-17 DIAGNOSIS — F0280 Dementia in other diseases classified elsewhere without behavioral disturbance: Secondary | ICD-10-CM | POA: Diagnosis not present

## 2018-08-25 DIAGNOSIS — F0280 Dementia in other diseases classified elsewhere without behavioral disturbance: Secondary | ICD-10-CM | POA: Diagnosis not present

## 2018-08-25 DIAGNOSIS — J4540 Moderate persistent asthma, uncomplicated: Secondary | ICD-10-CM | POA: Diagnosis not present

## 2018-08-25 DIAGNOSIS — E11621 Type 2 diabetes mellitus with foot ulcer: Secondary | ICD-10-CM | POA: Diagnosis not present

## 2018-08-25 DIAGNOSIS — L97523 Non-pressure chronic ulcer of other part of left foot with necrosis of muscle: Secondary | ICD-10-CM | POA: Diagnosis not present

## 2018-08-25 DIAGNOSIS — L89620 Pressure ulcer of left heel, unstageable: Secondary | ICD-10-CM | POA: Diagnosis not present

## 2018-08-25 DIAGNOSIS — G301 Alzheimer's disease with late onset: Secondary | ICD-10-CM | POA: Diagnosis not present

## 2018-09-01 DIAGNOSIS — G301 Alzheimer's disease with late onset: Secondary | ICD-10-CM | POA: Diagnosis not present

## 2018-09-01 DIAGNOSIS — L89620 Pressure ulcer of left heel, unstageable: Secondary | ICD-10-CM | POA: Diagnosis not present

## 2018-09-01 DIAGNOSIS — E11621 Type 2 diabetes mellitus with foot ulcer: Secondary | ICD-10-CM | POA: Diagnosis not present

## 2018-09-01 DIAGNOSIS — F0280 Dementia in other diseases classified elsewhere without behavioral disturbance: Secondary | ICD-10-CM | POA: Diagnosis not present

## 2018-09-01 DIAGNOSIS — L97523 Non-pressure chronic ulcer of other part of left foot with necrosis of muscle: Secondary | ICD-10-CM | POA: Diagnosis not present

## 2018-09-01 DIAGNOSIS — J4540 Moderate persistent asthma, uncomplicated: Secondary | ICD-10-CM | POA: Diagnosis not present

## 2018-09-02 ENCOUNTER — Ambulatory Visit (INDEPENDENT_AMBULATORY_CARE_PROVIDER_SITE_OTHER): Payer: Medicare Other | Admitting: Orthopedic Surgery

## 2018-09-02 ENCOUNTER — Encounter (INDEPENDENT_AMBULATORY_CARE_PROVIDER_SITE_OTHER): Payer: Self-pay | Admitting: Orthopedic Surgery

## 2018-09-02 VITALS — Ht 66.0 in | Wt 118.0 lb

## 2018-09-02 DIAGNOSIS — L97422 Non-pressure chronic ulcer of left heel and midfoot with fat layer exposed: Secondary | ICD-10-CM | POA: Diagnosis not present

## 2018-09-02 DIAGNOSIS — I70211 Atherosclerosis of native arteries of extremities with intermittent claudication, right leg: Secondary | ICD-10-CM | POA: Diagnosis not present

## 2018-09-02 DIAGNOSIS — E11621 Type 2 diabetes mellitus with foot ulcer: Secondary | ICD-10-CM | POA: Diagnosis not present

## 2018-09-02 DIAGNOSIS — L97521 Non-pressure chronic ulcer of other part of left foot limited to breakdown of skin: Secondary | ICD-10-CM | POA: Diagnosis not present

## 2018-09-04 ENCOUNTER — Other Ambulatory Visit: Payer: Self-pay

## 2018-09-05 DIAGNOSIS — J4540 Moderate persistent asthma, uncomplicated: Secondary | ICD-10-CM | POA: Diagnosis not present

## 2018-09-05 DIAGNOSIS — E11621 Type 2 diabetes mellitus with foot ulcer: Secondary | ICD-10-CM | POA: Diagnosis not present

## 2018-09-05 DIAGNOSIS — G301 Alzheimer's disease with late onset: Secondary | ICD-10-CM | POA: Diagnosis not present

## 2018-09-05 DIAGNOSIS — L97423 Non-pressure chronic ulcer of left heel and midfoot with necrosis of muscle: Secondary | ICD-10-CM | POA: Diagnosis not present

## 2018-09-05 DIAGNOSIS — L89620 Pressure ulcer of left heel, unstageable: Secondary | ICD-10-CM | POA: Diagnosis not present

## 2018-09-05 DIAGNOSIS — F0280 Dementia in other diseases classified elsewhere without behavioral disturbance: Secondary | ICD-10-CM | POA: Diagnosis not present

## 2018-09-07 ENCOUNTER — Encounter (INDEPENDENT_AMBULATORY_CARE_PROVIDER_SITE_OTHER): Payer: Self-pay | Admitting: Orthopedic Surgery

## 2018-09-07 ENCOUNTER — Other Ambulatory Visit: Payer: Self-pay | Admitting: Family Medicine

## 2018-09-07 NOTE — Progress Notes (Addendum)
Office Visit Note   Patient: [name]           Date of Birth: 1931/09/05           MRN: [id number] Visit Date: 09/02/2018              Requested by: Marin Olp, MD [place], [place] [address] PCP: Marin Olp, MD  Chief Complaint  Patient presents with  . Left Foot - Pain, Follow-up      HPI: Patient is a 84 year old woman who is currently weightbearing with a walker.  She presents with a new ulcer over the MTP joint of the left great toe she previously has been treated for a heel ulcer with wearing the [facility] boot.  Assessment & Plan: Visit Diagnoses:  1. Diabetic ulcer of left heel associated with type 2 diabetes mellitus, with fat layer exposed ([place])   2. Ulcer of toe of left foot, limited to breakdown of skin ([place])     Plan: Ulcer was debrided of skin and soft tissues she will use Silvadene dressing changes we will reevaluate in 3 weeks.  Follow-Up Instructions: Return in about 3 weeks (around 09/23/2018).   Ortho Exam  Patient is alert, oriented, no adenopathy, well-dressed, normal affect, normal respiratory effort. Examination patient's decubitus heel ulcer has completely resolved she is developed a new abrasion ulcer over the MTP joint of the left great toe.  There is no redness no cellulitis no signs of infection.  After informed consent a 10 blade knife was used to debride the skin and soft tissue back to healthy viable granulation tissue.  Band-Aid and Iodosorb was applied.  Ulcer is 2 cm in diameter after debridement 1 mm deep there is no exposed bone or tendon.  Imaging: No results found. No images are attached to the encounter.  Labs: Lab Results  Component Value Date   HGBA1C 7.2 (A) 05/01/2018   HGBA1C 7.1 (H) 12/31/2017   HGBA1C 7.0 07/23/2017   ESRSEDRATE 4 11/17/2014   REPTSTATUS 07/21/2009 FINAL 07/20/2009   REPTSTATUS 07/21/2009 FINAL 07/20/2009   CULT  07/20/2009    Multiple bacterial morphotypes present, none  predominant. Suggest appropriate recollection if clinically indicated.   LABORGA ESCHERICHIA COLI 08/13/2016     Lab Results  Component Value Date   ALBUMIN 3.4 (L) 05/01/2018   ALBUMIN 3.4 (L) 12/31/2017   ALBUMIN 3.6 07/23/2017    Body mass index is 19.05 kg/m.  Orders:  No orders of the defined types were placed in this encounter.  No orders of the defined types were placed in this encounter.    Procedures: No procedures performed  Clinical Data: No additional findings.  ROS:  All other systems negative, except as noted in the HPI. Review of Systems  Objective: Vital Signs: Ht 5\' 6"  (1.676 m)   Wt 118 lb (53.5 kg)   LMP  (LMP Unknown)   BMI 19.05 kg/m   Specialty Comments:  No specialty comments available.  PMFS History: Patient Active Problem List   Diagnosis Date Noted  . Protein-calorie malnutrition ([place]) 07/21/2018  . Osteoporosis 07/02/2018  . Diabetic ulcer of foot with fat layer exposed ([place]) 05/14/2018  . GERD (gastroesophageal reflux disease) 05/02/2018  . Generalized weakness 05/02/2018  . History of DVT in adulthood 06/26/2017  . LBBB (left bundle branch block) 10/09/2016  . Pleural effusion, left 09/23/2016  . Coronary artery calcification seen on CAT scan 08/28/2016  . Alzheimer's disease ([place]) 01/04/2016  . Pancreas  cyst 07/02/2015  . Asthma, chronic 03/16/2015  . Primary open angle glaucoma of left eye, moderate stage 12/25/2014  . Pseudophakia of both eyes 02/15/2013  . Anemia 05/25/2012  . Astigmatism 04/29/2012  . Allergic rhinitis 10/30/2010  . Venous (peripheral) insufficiency 04/13/2007  . Diabetes ([place]) 12/18/2006  . HYPERTENSION, BENIGN SYSTEMIC 12/18/2006  . Atherosclerosis of native arteries of extremity with intermittent claudication ([place]) 12/18/2006  . OSTEOARTHRITIS OF SPINE, NOS 12/18/2006   Past Medical History:  Diagnosis Date  . Alzheimer's dementia without behavioral disturbance ([place])    diagnosed 12/2015; Dr.  Jade/neurology  . Anemia   . Asthma   . Blood transfusion 05-24-12  . Blood transfusion without reported diagnosis   . Chronic kidney disease    dr Clover Mealy  . Diabetes mellitus   . Diverticulosis   . DVT (deep venous thrombosis) ([place]) 03/2005   hx.left leg  . Esophageal stricture   . GERD (gastroesophageal reflux disease)   . Glaucoma   . Hiatal hernia 05/07/13  . Hyperlipidemia   . Hypertension   . Osteoarthritis   . Pancreatic duct dilated 05/07/13  . Pulmonary nodules 05/07/13  . PVD (peripheral vascular disease) ([place])   . Tubular adenoma of colon     Family History  Adopted: Yes  Problem Relation Age of Onset  . Migraines Daughter   . Cancer Son   . Colon cancer Neg Hx     Past Surgical History:  Procedure Laterality Date  . CATARACT EXTRACTION, BILATERAL    . COLONOSCOPY  05/27/2012   Procedure: COLONOSCOPY;  Surgeon: Lafayette Dragon, MD;  Location: [facility] ENDOSCOPY;  Service: Endoscopy;  Laterality: N/A;  . ESOPHAGOGASTRODUODENOSCOPY  05/26/2012   Procedure: ESOPHAGOGASTRODUODENOSCOPY (EGD);  Surgeon: Lafayette Dragon, MD;  Location: [facility];  Service: Endoscopy;  Laterality: N/A;  . EUS N/A 07/08/2013   Procedure: UPPER ENDOSCOPIC ULTRASOUND (EUS) LINEAR;  Surgeon: Milus Banister, MD;  Location: [facility] ENDOSCOPY;  Service: Endoscopy;  Laterality: N/A;  need side view scope  . EYE SURGERY    . NM MYOVIEW LTD  10/2016   Read as intermediate risk due to EF of 38%.Anemia or infarction noted. Only abnormal septal WM.   Marland Kitchen TRANSTHORACIC ECHOCARDIOGRAM  10/2016    EF 45-50% with possible inferior hypokinesis. Aortic sclerosis without stenosis (ordered to reassess EF from Myoview)  . VASCULAR SURGERY  09/2004  . WRIST FRACTURE SURGERY     R wrist fracture, plate   Social History   Occupational History  . Not on file  Tobacco Use  . Smoking status: Never Smoker  . Smokeless tobacco: Never Used  Substance and Sexual Activity  . Alcohol use: No  . Drug use: No  . Sexual activity:  Not Currently

## 2018-09-08 NOTE — Telephone Encounter (Signed)
Pt requesting refill on metformin but medication d/c'd 07/02/18 per Dr Garret Reddish, [facility]; however pt receive refill 08/05/18 per Delia Chimes, [facility]  With notation that Pt needs to ov to establish care"; will route to PCP office for final disposition.

## 2018-09-10 ENCOUNTER — Other Ambulatory Visit: Payer: Self-pay | Admitting: Family Medicine

## 2018-09-10 NOTE — Telephone Encounter (Signed)
Requested medication (s) are due for refill today: yes  Requested medication (s) are on the active medication list: yes    Last refill: 08/05/18 #30 0 refills  Future visit scheduled no  Notes to clinic: Last  prescribed by different provider  Requested Prescriptions  Pending Prescriptions Disp Refills   metFORMIN (GLUCOPHAGE) 500 MG tablet [Pharmacy Med Name: METFORMIN HCL 500 MG TABLET] 30 tablet 0    Sig: TAKE 1 TABLET ONCE DAILY WITH SUPPER.     Endocrinology:  Diabetes - Biguanides Failed - 09/10/2018  9:13 AM      Failed - eGFR in normal range and within 360 days    GFR, Est African American  Date Value Ref Range Status  08/13/2016 60 >=60 mL/min Final   GFR calc Af Amer  Date Value Ref Range Status  12/31/2017 68 >59 mL/min/1.73 Final   GFR, Est Non African American  Date Value Ref Range Status  08/13/2016 52 (L) >=60 mL/min Final   GFR calc non Af Amer  Date Value Ref Range Status  12/31/2017 59 (L) >59 mL/min/1.73 Final   GFR  Date Value Ref Range Status  05/01/2018 58.69 (L) >60.00 mL/min Final         Passed - Cr in normal range and within 360 days    Creat  Date Value Ref Range Status  08/19/2016 0.96 (H) 0.60 - 0.88 mg/dL Final    Comment:      For patients > or = 82 years of age: The upper reference limit for Creatinine is approximately 13% higher for people identified as African-American.      Creatinine, Ser  Date Value Ref Range Status  05/01/2018 0.96 0.40 - 1.20 mg/dL Final         Passed - HBA1C is between 0 and 7.9 and within 180 days    Hemoglobin A1C  Date Value Ref Range Status  05/01/2018 7.2 (A) 4.0 - 5.6 % Final   Hgb A1c MFr Bld  Date Value Ref Range Status  12/31/2017 7.1 (H) 4.8 - 5.6 % Final    Comment:             Prediabetes: 5.7 - 6.4          Diabetes: >6.4          Glycemic control for adults with diabetes: <7.0          Passed - Valid encounter within last 6 months    Recent Outpatient Visits          1  month ago Moderate persistent asthma with exacerbation   [facility], Brayton Mars, MD   2 months ago Age-related osteoporosis without current pathological fracture   [facility], Brayton Mars, MD   3 months ago Late onset Alzheimer's disease with behavioral disturbance   [facility] [place], Brayton Mars, MD   3 months ago Pressure injury of left heel, stage 2   [place] [place], Brayton Mars, MD   4 months ago Late onset Alzheimer's disease with behavioral disturbance   [facility], Brayton Mars, MD      Future Appointments            In 1 week Newt Minion, MD [facility]   In 3 weeks Yong Channel, Brayton Mars, MD [place], [facility]

## 2018-09-14 DIAGNOSIS — J4540 Moderate persistent asthma, uncomplicated: Secondary | ICD-10-CM | POA: Diagnosis not present

## 2018-09-14 DIAGNOSIS — G301 Alzheimer's disease with late onset: Secondary | ICD-10-CM | POA: Diagnosis not present

## 2018-09-14 DIAGNOSIS — F0280 Dementia in other diseases classified elsewhere without behavioral disturbance: Secondary | ICD-10-CM | POA: Diagnosis not present

## 2018-09-14 DIAGNOSIS — E11621 Type 2 diabetes mellitus with foot ulcer: Secondary | ICD-10-CM | POA: Diagnosis not present

## 2018-09-14 DIAGNOSIS — L89620 Pressure ulcer of left heel, unstageable: Secondary | ICD-10-CM | POA: Diagnosis not present

## 2018-09-14 DIAGNOSIS — L97423 Non-pressure chronic ulcer of left heel and midfoot with necrosis of muscle: Secondary | ICD-10-CM | POA: Diagnosis not present

## 2018-09-16 DIAGNOSIS — E11621 Type 2 diabetes mellitus with foot ulcer: Secondary | ICD-10-CM | POA: Diagnosis not present

## 2018-09-16 DIAGNOSIS — I10 Essential (primary) hypertension: Secondary | ICD-10-CM | POA: Diagnosis not present

## 2018-09-16 DIAGNOSIS — J4550 Severe persistent asthma, uncomplicated: Secondary | ICD-10-CM

## 2018-09-16 DIAGNOSIS — M6281 Muscle weakness (generalized): Secondary | ICD-10-CM

## 2018-09-16 DIAGNOSIS — L97423 Non-pressure chronic ulcer of left heel and midfoot with necrosis of muscle: Secondary | ICD-10-CM | POA: Diagnosis not present

## 2018-09-16 DIAGNOSIS — F0280 Dementia in other diseases classified elsewhere without behavioral disturbance: Secondary | ICD-10-CM

## 2018-09-16 DIAGNOSIS — Z7984 Long term (current) use of oral hypoglycemic drugs: Secondary | ICD-10-CM | POA: Diagnosis not present

## 2018-09-16 DIAGNOSIS — E1151 Type 2 diabetes mellitus with diabetic peripheral angiopathy without gangrene: Secondary | ICD-10-CM

## 2018-09-16 DIAGNOSIS — G301 Alzheimer's disease with late onset: Secondary | ICD-10-CM | POA: Diagnosis not present

## 2018-09-16 DIAGNOSIS — L89620 Pressure ulcer of left heel, unstageable: Secondary | ICD-10-CM | POA: Diagnosis not present

## 2018-09-16 DIAGNOSIS — Z86718 Personal history of other venous thrombosis and embolism: Secondary | ICD-10-CM

## 2018-09-16 DIAGNOSIS — L89151 Pressure ulcer of sacral region, stage 1: Secondary | ICD-10-CM | POA: Diagnosis not present

## 2018-09-16 DIAGNOSIS — R2689 Other abnormalities of gait and mobility: Secondary | ICD-10-CM | POA: Diagnosis not present

## 2018-09-21 ENCOUNTER — Encounter (INDEPENDENT_AMBULATORY_CARE_PROVIDER_SITE_OTHER): Payer: Self-pay | Admitting: Orthopedic Surgery

## 2018-09-21 ENCOUNTER — Ambulatory Visit (INDEPENDENT_AMBULATORY_CARE_PROVIDER_SITE_OTHER): Payer: Medicare Other | Admitting: Orthopedic Surgery

## 2018-09-21 VITALS — Ht 66.0 in | Wt 118.0 lb

## 2018-09-21 DIAGNOSIS — I70211 Atherosclerosis of native arteries of extremities with intermittent claudication, right leg: Secondary | ICD-10-CM | POA: Diagnosis not present

## 2018-09-21 DIAGNOSIS — L97521 Non-pressure chronic ulcer of other part of left foot limited to breakdown of skin: Secondary | ICD-10-CM | POA: Diagnosis not present

## 2018-09-21 NOTE — Progress Notes (Signed)
Office Visit Note   Patient: [name]           Date of Birth: 11-07-1930           MRN: [id number] Visit Date: 09/21/2018              Requested by: Marin Olp, MD [place], [place] [address] PCP: Marin Olp, MD  Chief Complaint  Patient presents with  . Left Foot - Follow-up, Wound Check      HPI: Patient is a 84 year old woman who presents with a ulcer beneath the left foot first metatarsal head.  She also has a ischemic pressure area over the dorsum of the left foot.  Assessment & Plan: Visit Diagnoses:  1. Ulcer of toe of left foot, limited to breakdown of skin ([place])     Plan: We will place her in a postoperative shoe with a felt relieving donut and they will be able to do dressing changes every other day.  Discussed the importance of nonweightbearing and elevation.  Follow-Up Instructions: Return in about 4 weeks (around 10/19/2018).   Ortho Exam  Patient is alert, oriented, no adenopathy, well-dressed, normal affect, normal respiratory effort. Examination patient does have a palpable dorsalis pedis and posterior tibial pulse.  She has a Medical illustrator grade 1 ulcer beneath the left foot first metatarsal head that has granulation tissue this does not probe to bone or tendon there is no drainage no cellulitis no swelling.  The ulcer is 2 cm in diameter 1 mm deep.  Imaging: No results found. No images are attached to the encounter.  Labs: Lab Results  Component Value Date   HGBA1C 7.2 (A) 05/01/2018   HGBA1C 7.1 (H) 12/31/2017   HGBA1C 7.0 07/23/2017   ESRSEDRATE 4 11/17/2014   REPTSTATUS 07/21/2009 FINAL 07/20/2009   REPTSTATUS 07/21/2009 FINAL 07/20/2009   CULT  07/20/2009    Multiple bacterial morphotypes present, none predominant. Suggest appropriate recollection if clinically indicated.   LABORGA ESCHERICHIA COLI 08/13/2016     Lab Results  Component Value Date   ALBUMIN 3.4 (L) 05/01/2018   ALBUMIN 3.4 (L) 12/31/2017   ALBUMIN 3.6 07/23/2017    Body mass index is 19.05 kg/m.  Orders:  No orders of the defined types were placed in this encounter.  No orders of the defined types were placed in this encounter.    Procedures: No procedures performed  Clinical Data: No additional findings.  ROS:  All other systems negative, except as noted in the HPI. Review of Systems  Objective: Vital Signs: Ht 5\' 6"  (1.676 m)   Wt 118 lb (53.5 kg)   LMP  (LMP Unknown)   BMI 19.05 kg/m   Specialty Comments:  No specialty comments available.  PMFS History: Patient Active Problem List   Diagnosis Date Noted  . Protein-calorie malnutrition ([place]) 07/21/2018  . Osteoporosis 07/02/2018  . Diabetic ulcer of foot with fat layer exposed ([place]) 05/14/2018  . GERD (gastroesophageal reflux disease) 05/02/2018  . Generalized weakness 05/02/2018  . History of DVT in adulthood 06/26/2017  . LBBB (left bundle branch block) 10/09/2016  . Pleural effusion, left 09/23/2016  . Coronary artery calcification seen on CAT scan 08/28/2016  . Alzheimer's disease ([place]) 01/04/2016  . Pancreas cyst 07/02/2015  . Asthma, chronic 03/16/2015  . Primary open angle glaucoma of left eye, moderate stage 12/25/2014  . Pseudophakia of both eyes 02/15/2013  . Anemia 05/25/2012  . Astigmatism 04/29/2012  . Allergic rhinitis 10/30/2010  .  Venous (peripheral) insufficiency 04/13/2007  . Diabetes ([place]) 12/18/2006  . HYPERTENSION, BENIGN SYSTEMIC 12/18/2006  . Atherosclerosis of native arteries of extremity with intermittent claudication ([place]) 12/18/2006  . OSTEOARTHRITIS OF SPINE, NOS 12/18/2006   Past Medical History:  Diagnosis Date  . Alzheimer's dementia without behavioral disturbance ([place])    diagnosed 12/2015; Dr. Jade/neurology  . Anemia   . Asthma   . Blood transfusion 05-24-12  . Blood transfusion without reported diagnosis   . Chronic kidney disease    dr Clover Mealy  . Diabetes mellitus   . Diverticulosis   . DVT  (deep venous thrombosis) ([place]) 03/2005   hx.left leg  . Esophageal stricture   . GERD (gastroesophageal reflux disease)   . Glaucoma   . Hiatal hernia 05/07/13  . Hyperlipidemia   . Hypertension   . Osteoarthritis   . Pancreatic duct dilated 05/07/13  . Pulmonary nodules 05/07/13  . PVD (peripheral vascular disease) ([place])   . Tubular adenoma of colon     Family History  Adopted: Yes  Problem Relation Age of Onset  . Migraines Daughter   . Cancer Son   . Colon cancer Neg Hx     Past Surgical History:  Procedure Laterality Date  . CATARACT EXTRACTION, BILATERAL    . COLONOSCOPY  05/27/2012   Procedure: COLONOSCOPY;  Surgeon: Lafayette Dragon, MD;  Location: [facility] END[facility]COPY;  Service: Endoscopy;  Laterality: N/A;  . ESOPHAGOGASTRODUODENOSCOPY  05/26/2012   Procedure: ESOPHAGOGASTRODUODENOSCOPY (EGD);  Surgeon: Lafayette Dragon, MD;  Location: [facility];  Service: Endoscopy;  Laterality: N/A;  . EUS N/A 07/08/2013   Procedure: UPPER ENDOSCOPIC ULTRASOUND (EUS) LINEAR;  Surgeon: Milus Banister, MD;  Location: [facility];  Service: Endoscopy;  Laterality: N/A;  need side view scope  . EYE SURGERY    . NM MYOVIEW LTD  10/2016   Read as intermediate risk due to EF of 38%.Anemia or infarction noted. Only abnormal septal WM.   Marland Kitchen TRANSTHORACIC ECHOCARDIOGRAM  10/2016    EF 45-50% with possible inferior hypokinesis. Aortic sclerosis without stenosis (ordered to reassess EF from Myoview)  . VASCULAR SURGERY  09/2004  . WRIST FRACTURE SURGERY     R wrist fracture, plate   Social History   Occupational History  . Not on file  Tobacco Use  . Smoking status: Never Smoker  . Smokeless tobacco: Never Used  Substance and Sexual Activity  . Alcohol use: No  . Drug use: No  . Sexual activity: Not Currently

## 2018-09-23 ENCOUNTER — Other Ambulatory Visit: Payer: Medicare Other

## 2018-09-23 DIAGNOSIS — G301 Alzheimer's disease with late onset: Secondary | ICD-10-CM | POA: Diagnosis not present

## 2018-09-23 DIAGNOSIS — E119 Type 2 diabetes mellitus without complications: Secondary | ICD-10-CM | POA: Diagnosis not present

## 2018-09-23 DIAGNOSIS — L97423 Non-pressure chronic ulcer of left heel and midfoot with necrosis of muscle: Secondary | ICD-10-CM | POA: Diagnosis not present

## 2018-09-23 DIAGNOSIS — J4540 Moderate persistent asthma, uncomplicated: Secondary | ICD-10-CM | POA: Diagnosis not present

## 2018-09-23 DIAGNOSIS — F0280 Dementia in other diseases classified elsewhere without behavioral disturbance: Secondary | ICD-10-CM | POA: Diagnosis not present

## 2018-09-23 DIAGNOSIS — E11621 Type 2 diabetes mellitus with foot ulcer: Secondary | ICD-10-CM | POA: Diagnosis not present

## 2018-09-23 DIAGNOSIS — L89620 Pressure ulcer of left heel, unstageable: Secondary | ICD-10-CM | POA: Diagnosis not present

## 2018-09-23 LAB — MICROALBUMIN / CREATININE URINE RATIO
CREATININE, U: 74.9 mg/dL
MICROALB UR: 1.3 mg/dL (ref 0.0–1.9)
MICROALB/CREAT RATIO: 1.7 mg/g (ref 0.0–30.0)

## 2018-09-24 ENCOUNTER — Telehealth: Payer: Self-pay | Admitting: Family Medicine

## 2018-09-24 ENCOUNTER — Other Ambulatory Visit: Payer: Self-pay

## 2018-09-24 DIAGNOSIS — R8290 Unspecified abnormal findings in urine: Secondary | ICD-10-CM

## 2018-09-24 NOTE — Telephone Encounter (Signed)
Copied from [place] [id number] [id number]. Topic: General - Other >> Sep 24, 2018 12:53 PM Rayann Heman wrote: Reason for CRM: pt husband called and would like urine results. Please advise [phone]

## 2018-09-24 NOTE — Telephone Encounter (Signed)
Spoke with husband Marcello Moores regarding results

## 2018-09-29 DIAGNOSIS — E11621 Type 2 diabetes mellitus with foot ulcer: Secondary | ICD-10-CM | POA: Diagnosis not present

## 2018-09-29 DIAGNOSIS — F0280 Dementia in other diseases classified elsewhere without behavioral disturbance: Secondary | ICD-10-CM | POA: Diagnosis not present

## 2018-09-29 DIAGNOSIS — L89620 Pressure ulcer of left heel, unstageable: Secondary | ICD-10-CM | POA: Diagnosis not present

## 2018-09-29 DIAGNOSIS — L97423 Non-pressure chronic ulcer of left heel and midfoot with necrosis of muscle: Secondary | ICD-10-CM | POA: Diagnosis not present

## 2018-09-29 DIAGNOSIS — G301 Alzheimer's disease with late onset: Secondary | ICD-10-CM | POA: Diagnosis not present

## 2018-09-29 DIAGNOSIS — J4540 Moderate persistent asthma, uncomplicated: Secondary | ICD-10-CM | POA: Diagnosis not present

## 2018-09-29 DIAGNOSIS — N390 Urinary tract infection, site not specified: Secondary | ICD-10-CM | POA: Diagnosis not present

## 2018-09-30 ENCOUNTER — Telehealth: Payer: Self-pay | Admitting: Family Medicine

## 2018-09-30 ENCOUNTER — Other Ambulatory Visit (INDEPENDENT_AMBULATORY_CARE_PROVIDER_SITE_OTHER): Payer: Self-pay

## 2018-09-30 ENCOUNTER — Telehealth (INDEPENDENT_AMBULATORY_CARE_PROVIDER_SITE_OTHER): Payer: Self-pay | Admitting: Orthopedic Surgery

## 2018-09-30 NOTE — Telephone Encounter (Signed)
[name]  [facility]  [phone]  [phone] Please fax if possible      Need orders on left foot

## 2018-09-30 NOTE — Telephone Encounter (Signed)
See note

## 2018-09-30 NOTE — Telephone Encounter (Signed)
Copied from [place] [phone]. Topic: Quick Communication - Home Health Verbal Orders >> Sep 30, 2018  9:13 AM Margot Ables wrote: Caller/Agency: Durward Fortes RN w/[facility] Callback Number: [phone], secure VM if not available Requesting OT/PT/Skilled Nursing/Social Work: wound care Frequency: 1. UA and culture sent in to [place] yesterday for pt due to symptoms of UTI. 2. Pt has wound on right buttock that is healing well and scabbed, requesting VO to cover with foarm border. 3. Pt has wound on left buttock the measures 1x1 - requesting VO to apply medihoney to wound bed with foam border. Also noted redness around outer portion of the wound due to pt sitting for long periods time. HH and husband will discuss a cushion to sit on.

## 2018-09-30 NOTE — Telephone Encounter (Signed)
Left foot wash with dial soap and water silvadene dressing changes daily. To wear felt donut and post op shoe and minimize weight bearing. This was faxed as requested

## 2018-09-30 NOTE — Telephone Encounter (Signed)
Called and left a voicemail message providing verbal orders as requested. 

## 2018-10-01 ENCOUNTER — Ambulatory Visit (INDEPENDENT_AMBULATORY_CARE_PROVIDER_SITE_OTHER): Payer: Medicare Other | Admitting: Family Medicine

## 2018-10-01 ENCOUNTER — Encounter: Payer: Self-pay | Admitting: Family Medicine

## 2018-10-01 VITALS — BP 124/66 | HR 71 | Ht 66.0 in | Wt 120.8 lb

## 2018-10-01 DIAGNOSIS — J3489 Other specified disorders of nose and nasal sinuses: Secondary | ICD-10-CM

## 2018-10-01 DIAGNOSIS — I10 Essential (primary) hypertension: Secondary | ICD-10-CM

## 2018-10-01 DIAGNOSIS — J4540 Moderate persistent asthma, uncomplicated: Secondary | ICD-10-CM | POA: Diagnosis not present

## 2018-10-01 DIAGNOSIS — E119 Type 2 diabetes mellitus without complications: Secondary | ICD-10-CM | POA: Diagnosis not present

## 2018-10-01 DIAGNOSIS — R8290 Unspecified abnormal findings in urine: Secondary | ICD-10-CM

## 2018-10-01 DIAGNOSIS — I70211 Atherosclerosis of native arteries of extremities with intermittent claudication, right leg: Secondary | ICD-10-CM

## 2018-10-01 MED ORDER — FAMOTIDINE 20 MG PO TABS: 20.0000 mg | ORAL_TABLET | Freq: Two times a day (BID) | ORAL | 5 refills | Status: DC | PRN

## 2018-10-01 NOTE — Progress Notes (Signed)
Subjective:  [name] is a 84 y.o. year old very pleasant female patient who presents for/with See problem oriented charting ROS- chronic runny nose. Breathing has been better. No edema. Still with open wound on left foot.    Past Medical History-  Patient Active Problem List   Diagnosis Date Noted  . History of DVT in adulthood 06/26/2017    Priority: High  . Coronary artery calcification seen on CAT scan 08/28/2016    Priority: High  . Alzheimer's disease ([place]) 01/04/2016    Priority: High  . Diabetes ([place]) 12/18/2006    Priority: High  . Atherosclerosis of native arteries of extremity with intermittent claudication ([place]) 12/18/2006    Priority: High  . Generalized weakness 05/02/2018    Priority: Medium  . Pancreas cyst 07/02/2015    Priority: Medium  . Asthma, chronic 03/16/2015    Priority: Medium  . Primary open angle glaucoma of left eye, moderate stage 12/25/2014    Priority: Medium  . HYPERTENSION, BENIGN SYSTEMIC 12/18/2006    Priority: Medium  . GERD (gastroesophageal reflux disease) 05/02/2018    Priority: Low  . LBBB (left bundle branch block) 10/09/2016    Priority: Low  . Pleural effusion, left 09/23/2016    Priority: Low  . Pseudophakia of both eyes 02/15/2013    Priority: Low  . Anemia 05/25/2012    Priority: Low  . Astigmatism 04/29/2012    Priority: Low  . Allergic rhinitis 10/30/2010    Priority: Low  . Venous (peripheral) insufficiency 04/13/2007    Priority: Low  . OSTEOARTHRITIS OF SPINE, NOS 12/18/2006    Priority: Low  . Protein-calorie malnutrition ([place]) 07/21/2018  . Osteoporosis 07/02/2018  . Diabetic ulcer of foot with fat layer exposed ([place]) 05/14/2018    Medications- reviewed and updated Current Outpatient Medications  Medication Sig Dispense Refill  . albuterol (PROAIR HFA) 108 (90 Base) MCG/ACT inhaler 2 PUFFS EVERY 6 HOURS AS NEEDED FOR SHORTNESS OF BREATH. 17 g 5  . albuterol (PROVENTIL) (2.5 MG/3ML) 0.083% nebulizer  solution USE THREE MILLILITERS VIA NEBULIZATION BY MOUTH EVERY 6 HOURS AS NEEDED FOR WHEEZING OR SHORTNESS OF BREATH 75 mL 1  . Ascorbic Acid (VITAMIN C) 1000 MG tablet Take 1,000 mg by mouth daily.    Marland Kitchen aspirin 81 MG tablet Take 81 mg by mouth daily.    Marland Kitchen azelastine (ASTELIN) 0.1 % nasal spray Place 1 spray into both nostrils 2 (two) times daily. Use in each nostril as directed 30 mL 11  . brimonidine (ALPHAGAN P) 0.1 % SOLN INSTILL 1 DROP IN BOTH EYE TWICE DAILY.    . budesonide (PULMICORT) 0.25 MG/2ML nebulizer solution Take 2 mLs (0.25 mg total) daily by nebulization. ICD10 Code-J45.909 60 mL 12  . calcium carbonate (OS-CAL) 600 MG TABS Take 600 mg by mouth 2 (two) times daily with a meal.    . citalopram (CELEXA) 10 MG tablet TAKE 1 TABLET ONCE DAILY. 90 tablet 0  . donepezil (ARICEPT) 10 MG tablet Take 1 tablet (10 mg total) by mouth at bedtime. 90 tablet 3  . famotidine (PEPCID) 20 MG tablet Take 1 tablet (20 mg total) by mouth 2 (two) times daily as needed for heartburn or indigestion. 60 tablet 5  . IRON PO Take 1 day or 1 dose by mouth.    . levothyroxine (SYNTHROID, LEVOTHROID) 25 MCG tablet Take by mouth.    . memantine (NAMENDA) 10 MG tablet Take 1 tablet (10 mg total) by mouth 2 (two) times daily. [place]  tablet 5  . metFORMIN (GLUCOPHAGE) 500 MG tablet TAKE 1 TABLET ONCE DAILY WITH SUPPER. 30 tablet 5  . Multiple Vitamin (MULTIVITAMIN WITH MINERALS) TABS tablet Take 1 tablet by mouth daily.    . mupirocin ointment (BACTROBAN) 2 % Apply 1 application topically 2 (two) times daily. Apply to the affected area 2 times a day 22 g 3   No current facility-administered medications for this visit.     Objective: BP 124/66 (BP Location: Left Arm, Patient Position: Sitting, Cuff Size: Normal)   Pulse 71   Ht 5\' 6"  (1.676 m)   Wt 120 lb 12.8 oz (54.8 kg)   LMP  (LMP Unknown)   SpO2 94%   BMI 19.50 kg/m  Gen: NAD, resting comfortably CV: RRR no murmurs rubs or gallops Lungs: CTAB no  crackles, wheeze, rhonchi Abdomen: Very thin Ext: no edema Skin: warm, dry, on left foot at lateral lower portion of first MTP joint- approximately 1.5 x 1.5 cm ulceration-stage II pressure ulcer noted.  Mild erythema and abrasion on top of foot-not warm or tender to touch. Neuro: In wheelchair, some confusion   Assessment/Plan:  Other notes: 1.continued home health for wound care and also seeing Dr. Sharol Given  2. Sparing pepcid for reflux 3. Still with runny nose- all year round.Refuses astelin nasal spray. Faucet per family- decline ent referral. Happy to refer to ENT if needed.  4.  Thrilled weight is up 2 pounds today  Hypertension S: controlled on no rx BP Readings from Last 3 Encounters:  10/01/18 124/66  08/11/18 (!) 108/54  07/24/18 (!) 118/59  A/P: We discussed blood pressure goal of <140/90. Continue current meds  Foul odor urine S:  confusion and foul odor to urine-increased from normal- we did a urine through home health  On Tuesday- still no results. Some hallucinations- seeing folks not there A/P: possible UTI- will await more information  Asthma, chronic S: compliant with pulmicort twice a day but does sometimes let it fall out of mouth- but breathing better overall. Uses albuterol sometimes at night and that helps A/P: stable- continue current medications  Diabetes ([place]) S: reasonably controlled with a1c under 8 considering dementia. Now off metformin Lab Results  Component Value Date   HGBA1C 7.2 (A) 05/01/2018   HGBA1C 7.1 (H) 12/31/2017   HGBA1C 7.0 07/23/2017  A/P: hopefully stable off metformin- update a1c today  Future Appointments  Date Time Provider [place]  10/20/2018 12:45 PM Newt Minion, MD [facility] None  10/23/2018 12:30 PM Marzetta Board, DPM [facility]  02/10/2019 10:30 AM Pieter Partridge, DO [facility] None   39-month and 1 day follow-up advised-sooner if needed  Lab/Order associations: Moderate persistent chronic asthma  without complication  HYPERTENSION, BENIGN SYSTEMIC  Type 2 diabetes mellitus without complication, without long-term current use of insulin (H[facility]) - Plan: Comprehensive metabolic panel, Hemoglobin A1c  Foul smelling urine  Meds ordered this encounter  Medications  . famotidine (PEPCID) 20 MG tablet    Sig: Take 1 tablet (20 mg total) by mouth 2 (two) times daily as needed for heartburn or indigestion.    Dispense:  60 tablet    Refill:  5   Return precautions advised.  Garret Reddish, MD

## 2018-10-01 NOTE — Patient Instructions (Addendum)
Please stop by lab before you go  3 months and 1 day follow up   No changes today but please let me know if you want to get ENT opinion on runny nose

## 2018-10-01 NOTE — Assessment & Plan Note (Signed)
S: reasonably controlled with a1c under 8 considering dementia. Now off metformin Lab Results  Component Value Date   HGBA1C 7.2 (A) 05/01/2018   HGBA1C 7.1 (H) 12/31/2017   HGBA1C 7.0 07/23/2017  A/P: hopefully stable off metformin- update a1c today

## 2018-10-01 NOTE — Assessment & Plan Note (Signed)
S: compliant with pulmicort twice a day but does sometimes let it fall out of mouth- but breathing better overall. Uses albuterol sometimes at night and that helps A/P: stable- continue current medications

## 2018-10-02 LAB — COMPREHENSIVE METABOLIC PANEL
ALT: 10 U/L (ref 0–35)
AST: 18 U/L (ref 0–37)
Albumin: 3.6 g/dL (ref 3.5–5.2)
Alkaline Phosphatase: 60 U/L (ref 39–117)
BUN: 19 mg/dL (ref 6–23)
CHLORIDE: 100 meq/L (ref 96–112)
CO2: 30 meq/L (ref 19–32)
CREATININE: 0.83 mg/dL (ref 0.40–1.20)
Calcium: 10.2 mg/dL (ref 8.4–10.5)
GFR: 69.36 mL/min (ref >60.00)
GLUCOSE: 161 mg/dL — AB (ref 70–99)
Potassium: 4.6 mEq/L (ref 3.5–5.1)
Sodium: 138 mEq/L (ref 135–145)
Total Bilirubin: 0.4 mg/dL (ref 0.2–1.2)
Total Protein: 6.5 g/dL (ref 6.0–8.3)

## 2018-10-02 LAB — HEMOGLOBIN A1C: Hgb A1c MFr Bld: 6.6 % — ABNORMAL HIGH (ref 4.6–6.5)

## 2018-10-05 ENCOUNTER — Telehealth: Payer: Self-pay | Admitting: Family Medicine

## 2018-10-05 NOTE — Telephone Encounter (Signed)
See note

## 2018-10-05 NOTE — Telephone Encounter (Signed)
Please remove metformin from med list and inform family.  Given her dementia-let us target an A1c goal of 8.5 or less.  We may consider restarting if he she gets above this goal.

## 2018-10-05 NOTE — Telephone Encounter (Signed)
Called and spoke with Husband Marcello Moores who states that he feels that she needs another day or two to determine if she needs to actually come in. I advised him that if he felt she needs an appointment to let us know and we will be happy to see her. Please advise regarding metformin

## 2018-10-05 NOTE — Telephone Encounter (Signed)
Copied from [place] [phone]. Topic: Quick Communication - See Telephone Encounter >> Oct 05, 2018 11:57 AM Blase Mess A wrote: CRM for notification. See Telephone encounter for: 10/05/18.  Calling to receive lab results concerned about UTI please advise

## 2018-10-05 NOTE — Telephone Encounter (Signed)
Megan Pina, RN is the one that called at 11:57am. She is calling back to let Dr Yong Channel know that she just spoke with her daughter and that she fell this past Friday and they think that is was because her sugar was low. They gave her some orange juice and it helped her a little. Megan Pina, RN thinks that she does not need to be on metformin anymore. The daughter, [name] would like the nurse to call her and see if Dr Yong Channel could see her this week because she is complaining of her back hurting ( may be related to the fall, but no other injuries ). [name] can be reached at [phone]

## 2018-10-06 ENCOUNTER — Other Ambulatory Visit: Payer: Self-pay

## 2018-10-06 NOTE — Telephone Encounter (Signed)
Called [place] (on [place]) and advised. Metformin has been removed from medication list.

## 2018-10-10 DIAGNOSIS — J4540 Moderate persistent asthma, uncomplicated: Secondary | ICD-10-CM | POA: Diagnosis not present

## 2018-10-10 DIAGNOSIS — G301 Alzheimer's disease with late onset: Secondary | ICD-10-CM | POA: Diagnosis not present

## 2018-10-10 DIAGNOSIS — L89620 Pressure ulcer of left heel, unstageable: Secondary | ICD-10-CM | POA: Diagnosis not present

## 2018-10-10 DIAGNOSIS — E11621 Type 2 diabetes mellitus with foot ulcer: Secondary | ICD-10-CM | POA: Diagnosis not present

## 2018-10-10 DIAGNOSIS — L97423 Non-pressure chronic ulcer of left heel and midfoot with necrosis of muscle: Secondary | ICD-10-CM | POA: Diagnosis not present

## 2018-10-10 DIAGNOSIS — F0280 Dementia in other diseases classified elsewhere without behavioral disturbance: Secondary | ICD-10-CM | POA: Diagnosis not present

## 2018-10-12 DIAGNOSIS — G301 Alzheimer's disease with late onset: Secondary | ICD-10-CM | POA: Diagnosis not present

## 2018-10-12 DIAGNOSIS — L89620 Pressure ulcer of left heel, unstageable: Secondary | ICD-10-CM | POA: Diagnosis not present

## 2018-10-12 DIAGNOSIS — J4540 Moderate persistent asthma, uncomplicated: Secondary | ICD-10-CM | POA: Diagnosis not present

## 2018-10-12 DIAGNOSIS — L97423 Non-pressure chronic ulcer of left heel and midfoot with necrosis of muscle: Secondary | ICD-10-CM | POA: Diagnosis not present

## 2018-10-12 DIAGNOSIS — E11621 Type 2 diabetes mellitus with foot ulcer: Secondary | ICD-10-CM | POA: Diagnosis not present

## 2018-10-12 DIAGNOSIS — F0280 Dementia in other diseases classified elsewhere without behavioral disturbance: Secondary | ICD-10-CM | POA: Diagnosis not present

## 2018-10-19 DIAGNOSIS — E11621 Type 2 diabetes mellitus with foot ulcer: Secondary | ICD-10-CM | POA: Diagnosis not present

## 2018-10-19 DIAGNOSIS — L89620 Pressure ulcer of left heel, unstageable: Secondary | ICD-10-CM | POA: Diagnosis not present

## 2018-10-19 DIAGNOSIS — F0280 Dementia in other diseases classified elsewhere without behavioral disturbance: Secondary | ICD-10-CM | POA: Diagnosis not present

## 2018-10-19 DIAGNOSIS — G301 Alzheimer's disease with late onset: Secondary | ICD-10-CM | POA: Diagnosis not present

## 2018-10-19 DIAGNOSIS — J4540 Moderate persistent asthma, uncomplicated: Secondary | ICD-10-CM | POA: Diagnosis not present

## 2018-10-19 DIAGNOSIS — L97423 Non-pressure chronic ulcer of left heel and midfoot with necrosis of muscle: Secondary | ICD-10-CM | POA: Diagnosis not present

## 2018-10-20 ENCOUNTER — Ambulatory Visit (INDEPENDENT_AMBULATORY_CARE_PROVIDER_SITE_OTHER): Payer: Medicare Other | Admitting: Physician Assistant

## 2018-10-20 ENCOUNTER — Encounter (INDEPENDENT_AMBULATORY_CARE_PROVIDER_SITE_OTHER): Payer: Self-pay | Admitting: Orthopedic Surgery

## 2018-10-20 VITALS — Ht 66.0 in | Wt 120.8 lb

## 2018-10-20 DIAGNOSIS — L97422 Non-pressure chronic ulcer of left heel and midfoot with fat layer exposed: Secondary | ICD-10-CM

## 2018-10-20 DIAGNOSIS — E11621 Type 2 diabetes mellitus with foot ulcer: Secondary | ICD-10-CM | POA: Diagnosis not present

## 2018-10-20 DIAGNOSIS — I872 Venous insufficiency (chronic) (peripheral): Secondary | ICD-10-CM | POA: Diagnosis not present

## 2018-10-20 DIAGNOSIS — L97521 Non-pressure chronic ulcer of other part of left foot limited to breakdown of skin: Secondary | ICD-10-CM | POA: Diagnosis not present

## 2018-10-20 DIAGNOSIS — I70211 Atherosclerosis of native arteries of extremities with intermittent claudication, right leg: Secondary | ICD-10-CM

## 2018-10-23 ENCOUNTER — Ambulatory Visit (INDEPENDENT_AMBULATORY_CARE_PROVIDER_SITE_OTHER): Payer: Medicare Other | Admitting: Podiatry

## 2018-10-23 ENCOUNTER — Encounter: Payer: Self-pay | Admitting: Podiatry

## 2018-10-23 ENCOUNTER — Encounter (INDEPENDENT_AMBULATORY_CARE_PROVIDER_SITE_OTHER): Payer: Self-pay | Admitting: Physician Assistant

## 2018-10-23 DIAGNOSIS — E1151 Type 2 diabetes mellitus with diabetic peripheral angiopathy without gangrene: Secondary | ICD-10-CM | POA: Diagnosis not present

## 2018-10-23 DIAGNOSIS — B351 Tinea unguium: Secondary | ICD-10-CM

## 2018-10-23 DIAGNOSIS — L89510 Pressure ulcer of right ankle, unstageable: Secondary | ICD-10-CM

## 2018-10-23 DIAGNOSIS — M79675 Pain in left toe(s): Secondary | ICD-10-CM

## 2018-10-23 DIAGNOSIS — M79674 Pain in right toe(s): Secondary | ICD-10-CM | POA: Diagnosis not present

## 2018-10-23 NOTE — Patient Instructions (Addendum)
Onychomycosis/Fungal Toenails  WHAT IS IT? An infection that lies within the keratin of your nail plate that is caused by a fungus.  WHY ME? Fungal infections affect all ages, sexes, races, and creeds.  There may be many factors that predispose you to a fungal infection such as age, coexisting medical conditions such as diabetes, or an autoimmune disease; stress, medications, fatigue, genetics, etc.  Bottom line: fungus thrives in a warm, moist environment and your shoes offer such a location.  IS IT CONTAGIOUS? Theoretically, yes.  You do not want to share shoes, nail clippers or files with someone who has fungal toenails.  Walking around barefoot in the same room or sleeping in the same bed is unlikely to transfer the organism.  It is important to realize, however, that fungus can spread easily from one nail to the next on the same foot.  HOW DO WE TREAT THIS?  There are several ways to treat this condition.  Treatment may depend on many factors such as age, medications, pregnancy, liver and kidney conditions, etc.  It is best to ask your doctor which options are available to you.  1. No treatment.   Unlike many other medical concerns, you can live with this condition.  However for many people this can be a painful condition and may lead to ingrown toenails or a bacterial infection.  It is recommended that you keep the nails cut short to help reduce the amount of fungal nail. 2. Topical treatment.  These range from herbal remedies to prescription strength nail lacquers.  About 40-50% effective, topicals require twice daily application for approximately 9 to 12 months or until an entirely new nail has grown out.  The most effective topicals are medical grade medications available through physicians offices. 3. Oral antifungal medications.  With an 80-90% cure rate, the most common oral medication requires 3 to 4 months of therapy and stays in your system for a year as the new nail grows out.  Oral  antifungal medications do require blood work to make sure it is a safe drug for you.  A liver function panel will be performed prior to starting the medication and after the first month of treatment.  It is important to have the blood work performed to avoid any harmful side effects.  In general, this medication safe but blood work is required. 4. Laser Therapy.  This treatment is performed by applying a specialized laser to the affected nail plate.  This therapy is noninvasive, fast, and non-painful.  It is not covered by insurance and is therefore, out of pocket.  The results have been very good with a 80-95% cure rate.  [facility] is the only practice in the area to offer this therapy. 5. Permanent Nail Avulsion.  Removing the entire nail so that a new nail will not grow back.  Peripheral Vascular Disease  Peripheral vascular disease (PVD) is a disease of the blood vessels that are not part of your heart and brain. A simple term for PVD is poor circulation. In most cases, PVD narrows the blood vessels that carry blood from your heart to the rest of your body. This can reduce the supply of blood to your arms, legs, and internal organs, like your stomach or kidneys. However, PVD most often affects a person's lower legs and feet. Without treatment, PVD tends to get worse. PVD can also lead to acute ischemic limb. This is when an arm or leg suddenly cannot get enough blood.  This is a medical emergency. Follow these instructions at home: Lifestyle  Do not use any products that contain nicotine or tobacco, such as cigarettes and e-cigarettes. If you need help quitting, ask your doctor.  Lose weight if you are overweight. Or, stay at a healthy weight as told by your doctor.  Eat a diet that is low in fat and cholesterol. If you need help, ask your doctor.  Exercise regularly. Ask your doctor for activities that are right for you. General instructions  Take over-the-counter and prescription  medicines only as told by your doctor.  Take good care of your feet: ? Wear comfortable shoes that fit well. ? Check your feet often for any cuts or sores.  Keep all follow-up visits as told by your doctor This is important. Contact a doctor if:  You have cramps in your legs when you walk.  You have leg pain when you are at rest.  You have coldness in a leg or foot.  Your skin changes.  You are unable to get or have an erection (erectile dysfunction).  You have cuts or sores on your feet that do not heal. Get help right away if:  Your arm or leg turns cold, numb, and blue.  Your arms or legs become red, warm, swollen, painful, or numb.  You have chest pain.  You have trouble breathing.  You suddenly have weakness in your face, arm, or leg.  You become very confused or you cannot speak.  You suddenly have a very bad headache.  You suddenly cannot see. Summary  Peripheral vascular disease (PVD) is a disease of the blood vessels.  A simple term for PVD is poor circulation. Without treatment, PVD tends to get worse.  Treatment may include exercise, low fat and low cholesterol diet, and quitting smoking. This information is not intended to replace advice given to you by your health care provider. Make sure you discuss any questions you have with your health care provider. Document Released: 01/01/2010 Document Revised: 11/14/2016 Document Reviewed: 11/14/2016 Elsevier Interactive Patient Education  2019 [employer].

## 2018-10-23 NOTE — Progress Notes (Signed)
Office Visit Note   Patient: [name]           Date of Birth: [birth year]           MRN: [id number] Visit Date: 10/20/2018              Requested by: Marin Olp, MD [place], [place] [id number] PCP: Marin Olp, MD  Chief Complaint  Patient presents with  . Left Foot - Follow-up      HPI: The patient is an 85 year old woman here with her husband and daughter for follow-up of an ulcer beneath the left foot fifth metatarsal head.  She also had a small ischemic pressure area over the dorsum of the left foot and this is nearly healed.  There is been scant drainage from the area.  They have been using Bactroban ointment to the areas.  Assessment & Plan: Visit Diagnoses:  1. Ulcer of toe of left foot, limited to breakdown of skin ([place])   2. Venous (peripheral) insufficiency   3. Diabetic ulcer of left heel associated with type 2 diabetes mellitus, with fat layer exposed ([place])     Plan: After informed consent the callus/ulcer over the first metatarsal head was debrided with a #10 blade knife and the patient tolerated this well.  They are continuing to use a postoperative shoe with a felt relieving doughnut and every other day dressing changes with Bactroban.  Follow-Up Instructions: Return in about 4 weeks (around 11/17/2018).   Ortho Exam  Patient is alert, oriented, no adenopathy, well-dressed, normal affect, normal respiratory effort. Examination of the left foot shows a Waggoner grade 1 ulcer beneath the first metatarsal head with good granulation without signs of cellulitis.  There is no edema.  The ulcer continues to be approximately 2 cm in diameter and about 1 mm in depth.  Palpable pedal pulses.  Imaging: No results found.   Labs: Lab Results  Component Value Date   HGBA1C 6.6 (H) 10/01/2018   HGBA1C 7.2 (A) 05/01/2018   HGBA1C 7.1 (H) 12/31/2017   ESRSEDRATE 4 11/17/2014   REPTSTATUS 07/21/2009 FINAL 07/20/2009   REPTSTATUS 07/21/2009  FINAL 07/20/2009   CULT  07/20/2009    Multiple bacterial morphotypes present, none predominant. Suggest appropriate recollection if clinically indicated.   LABORGA ESCHERICHIA COLI 08/13/2016     Lab Results  Component Value Date   ALBUMIN 3.6 10/01/2018   ALBUMIN 3.4 (L) 05/01/2018   ALBUMIN 3.4 (L) 12/31/2017    Body mass index is 19.5 kg/m.  Orders:  No orders of the defined types were placed in this encounter.  No orders of the defined types were placed in this encounter.    Procedures: No procedures performed  Clinical Data: No additional findings.  ROS:  All other systems negative, except as noted in the HPI. Review of Systems  Objective: Vital Signs: Ht 5\' 6"  (1.676 m)   Wt 120 lb 12.8 oz (54.8 kg)   LMP  (LMP Unknown)   BMI 19.50 kg/m   Specialty Comments:  No specialty comments available.  PMFS History: Patient Active Problem List   Diagnosis Date Noted  . Protein-calorie malnutrition ([place]) 07/21/2018  . Osteoporosis 07/02/2018  . Diabetic ulcer of foot with fat layer exposed ([place]) 05/14/2018  . GERD (gastroesophageal reflux disease) 05/02/2018  . Generalized weakness 05/02/2018  . History of DVT in adulthood 06/26/2017  . LBBB (left bundle branch block) 10/09/2016  . Pleural effusion, left 09/23/2016  . Coronary artery  calcification seen on CAT scan 08/28/2016  . Alzheimer's disease ([place]) 01/04/2016  . Pancreas cyst 07/02/2015  . Asthma, chronic 03/16/2015  . Primary open angle glaucoma of left eye, moderate stage 12/25/2014  . Pseudophakia of both eyes 02/15/2013  . Anemia 05/25/2012  . Astigmatism 04/29/2012  . Allergic rhinitis 10/30/2010  . Venous (peripheral) insufficiency 04/13/2007  . Diabetes ([place]) 12/18/2006  . HYPERTENSION, BENIGN SYSTEMIC 12/18/2006  . Atherosclerosis of native arteries of extremity with intermittent claudication ([place]) 12/18/2006  . OSTEOARTHRITIS OF SPINE, NOS 12/18/2006   Past Medical History:  Diagnosis  Date  . Alzheimer's dementia without behavioral disturbance ([place])    diagnosed 12/2015; Dr. Jade/neurology  . Anemia   . Asthma   . Blood transfusion 05-24-12  . Blood transfusion without reported diagnosis   . Chronic kidney disease    dr Clover Mealy  . Diabetes mellitus   . Diverticulosis   . DVT (deep venous thrombosis) ([place]) 03/2005   hx.left leg  . Esophageal stricture   . GERD (gastroesophageal reflux disease)   . Glaucoma   . Hiatal hernia 05/07/13  . Hyperlipidemia   . Hypertension   . Osteoarthritis   . Pancreatic duct dilated 05/07/13  . Pulmonary nodules 05/07/13  . PVD (peripheral vascular disease) ([place])   . Tubular adenoma of colon     Family History  Adopted: Yes  Problem Relation Age of Onset  . Migraines Daughter   . Cancer Son   . Colon cancer Neg Hx     Past Surgical History:  Procedure Laterality Date  . CATARACT EXTRACTION, BILATERAL    . COLONOSCOPY  05/27/2012   Procedure: COLONOSCOPY;  Surgeon: Lafayette Dragon, MD;  Location: [facility] ENDOSCOPY;  Service: Endoscopy;  Laterality: N/A;  . ESOPHAGOGASTRODUODENOSCOPY  05/26/2012   Procedure: ESOPHAGOGASTRODUODENOSCOPY (EGD);  Surgeon: Lafayette Dragon, MD;  Location: [facility];  Service: Endoscopy;  Laterality: N/A;  . EUS N/A 07/08/2013   Procedure: UPPER ENDOSCOPIC ULTRASOUND (EUS) LINEAR;  Surgeon: Milus Banister, MD;  Location: [facility] ENDOSCOPY;  Service: Endoscopy;  Laterality: N/A;  need side view scope  . EYE SURGERY    . NM MYOVIEW LTD  10/2016   Read as intermediate risk due to EF of 38%.Anemia or infarction noted. Only abnormal septal WM.   Marland Kitchen TRANSTHORACIC ECHOCARDIOGRAM  10/2016    EF 45-50% with possible inferior hypokinesis. Aortic sclerosis without stenosis (ordered to reassess EF from Myoview)  . VASCULAR SURGERY  09/2004  . WRIST FRACTURE SURGERY     R wrist fracture, plate   Social History   Occupational History  . Not on file  Tobacco Use  . Smoking status: Never Smoker  . Smokeless tobacco: Never  Used  Substance and Sexual Activity  . Alcohol use: No  . Drug use: No  . Sexual activity: Not Currently

## 2018-10-27 DIAGNOSIS — L89620 Pressure ulcer of left heel, unstageable: Secondary | ICD-10-CM | POA: Diagnosis not present

## 2018-10-27 DIAGNOSIS — L97423 Non-pressure chronic ulcer of left heel and midfoot with necrosis of muscle: Secondary | ICD-10-CM | POA: Diagnosis not present

## 2018-10-27 DIAGNOSIS — F0280 Dementia in other diseases classified elsewhere without behavioral disturbance: Secondary | ICD-10-CM | POA: Diagnosis not present

## 2018-10-27 DIAGNOSIS — G301 Alzheimer's disease with late onset: Secondary | ICD-10-CM | POA: Diagnosis not present

## 2018-10-27 DIAGNOSIS — J4540 Moderate persistent asthma, uncomplicated: Secondary | ICD-10-CM | POA: Diagnosis not present

## 2018-10-27 DIAGNOSIS — E11621 Type 2 diabetes mellitus with foot ulcer: Secondary | ICD-10-CM | POA: Diagnosis not present

## 2018-10-30 ENCOUNTER — Ambulatory Visit (INDEPENDENT_AMBULATORY_CARE_PROVIDER_SITE_OTHER): Payer: Medicare Other | Admitting: Family Medicine

## 2018-10-30 ENCOUNTER — Encounter: Payer: Self-pay | Admitting: Family Medicine

## 2018-10-30 VITALS — BP 148/74 | HR 58 | Temp 97.6°F | Wt 119.8 lb

## 2018-10-30 DIAGNOSIS — I10 Essential (primary) hypertension: Secondary | ICD-10-CM | POA: Diagnosis not present

## 2018-10-30 DIAGNOSIS — G301 Alzheimer's disease with late onset: Secondary | ICD-10-CM

## 2018-10-30 DIAGNOSIS — F02818 Dementia in other diseases classified elsewhere, unspecified severity, with other behavioral disturbance: Secondary | ICD-10-CM

## 2018-10-30 DIAGNOSIS — F0281 Dementia in other diseases classified elsewhere with behavioral disturbance: Secondary | ICD-10-CM | POA: Diagnosis not present

## 2018-10-30 DIAGNOSIS — E119 Type 2 diabetes mellitus without complications: Secondary | ICD-10-CM
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
## 2018-10-30 NOTE — Assessment & Plan Note (Signed)
S: controlled poorly today on no medication- patient had been on metoprolol years ago BP Readings from Last 3 Encounters:  10/30/18 (!) 148/74  10/01/18 124/66  08/11/18 (!) 108/54  A/P: We discussed blood pressure goal of <140/90 ideally given history of diabetes, atherosclerosis with claudication-on the other hand given her age and fall risk we do not want to be aggressive with medications.  This is the first elevation we have seen in a few visits so we opted to simply repeat at next visit.  She does not monitor blood pressure at home.  Continue off medication for now

## 2018-10-30 NOTE — Patient Instructions (Addendum)
Glad it has been a good month  My only concern today is the blood pressure is slightly high. Has looked good on the other visits in recent months so lets just monitor for now.   4 month follow up or sooner if needed
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
## 2018-10-30 NOTE — Assessment & Plan Note (Signed)
S:  controlled on no medication-stopped metformin in September due to weight loss Lab Results  Component Value Date   HGBA1C 6.6 (H) 10/01/2018   HGBA1C 7.2 (A) 05/01/2018   HGBA1C 7.1 (H) 12/31/2017   A/P: Given patient's Alzheimer's-I believe an A1c goal of 8.5 or less is reasonable.  Can repeat A1c at next visit

## 2018-11-03 DIAGNOSIS — E11621 Type 2 diabetes mellitus with foot ulcer: Secondary | ICD-10-CM | POA: Diagnosis not present

## 2018-11-03 DIAGNOSIS — J4540 Moderate persistent asthma, uncomplicated: Secondary | ICD-10-CM | POA: Diagnosis not present

## 2018-11-03 DIAGNOSIS — L97423 Non-pressure chronic ulcer of left heel and midfoot with necrosis of muscle: Secondary | ICD-10-CM | POA: Diagnosis not present

## 2018-11-03 DIAGNOSIS — F0280 Dementia in other diseases classified elsewhere without behavioral disturbance: Secondary | ICD-10-CM | POA: Diagnosis not present

## 2018-11-03 DIAGNOSIS — G301 Alzheimer's disease with late onset: Secondary | ICD-10-CM | POA: Diagnosis not present

## 2018-11-03 DIAGNOSIS — L89620 Pressure ulcer of left heel, unstageable: Secondary | ICD-10-CM | POA: Diagnosis not present

## 2018-11-04 DIAGNOSIS — L89152 Pressure ulcer of sacral region, stage 2: Secondary | ICD-10-CM | POA: Diagnosis not present

## 2018-11-04 DIAGNOSIS — E1151 Type 2 diabetes mellitus with diabetic peripheral angiopathy without gangrene: Secondary | ICD-10-CM | POA: Diagnosis not present

## 2018-11-04 DIAGNOSIS — F0280 Dementia in other diseases classified elsewhere without behavioral disturbance: Secondary | ICD-10-CM | POA: Diagnosis not present

## 2018-11-04 DIAGNOSIS — J4540 Moderate persistent asthma, uncomplicated: Secondary | ICD-10-CM | POA: Diagnosis not present

## 2018-11-04 DIAGNOSIS — G301 Alzheimer's disease with late onset: Secondary | ICD-10-CM | POA: Diagnosis not present

## 2018-11-04 DIAGNOSIS — Z7984 Long term (current) use of oral hypoglycemic drugs: Secondary | ICD-10-CM | POA: Diagnosis not present

## 2018-11-06 DIAGNOSIS — G301 Alzheimer's disease with late onset: Secondary | ICD-10-CM | POA: Diagnosis not present

## 2018-11-06 DIAGNOSIS — J4540 Moderate persistent asthma, uncomplicated: Secondary | ICD-10-CM | POA: Diagnosis not present

## 2018-11-06 DIAGNOSIS — R2689 Other abnormalities of gait and mobility: Secondary | ICD-10-CM | POA: Diagnosis not present

## 2018-11-06 DIAGNOSIS — I10 Essential (primary) hypertension: Secondary | ICD-10-CM | POA: Diagnosis not present

## 2018-11-06 DIAGNOSIS — Z86718 Personal history of other venous thrombosis and embolism: Secondary | ICD-10-CM | POA: Diagnosis not present

## 2018-11-06 DIAGNOSIS — L89152 Pressure ulcer of sacral region, stage 2: Secondary | ICD-10-CM | POA: Diagnosis not present

## 2018-11-06 DIAGNOSIS — E1151 Type 2 diabetes mellitus with diabetic peripheral angiopathy without gangrene: Secondary | ICD-10-CM | POA: Diagnosis not present

## 2018-11-06 DIAGNOSIS — Z7984 Long term (current) use of oral hypoglycemic drugs: Secondary | ICD-10-CM | POA: Diagnosis not present

## 2018-11-06 DIAGNOSIS — M6281 Muscle weakness (generalized): Secondary | ICD-10-CM | POA: Diagnosis not present

## 2018-11-07 ENCOUNTER — Other Ambulatory Visit: Payer: Self-pay | Admitting: Family Medicine

## 2018-11-09 NOTE — Telephone Encounter (Signed)
See note

## 2018-11-09 NOTE — Telephone Encounter (Signed)
Requested medication (s) are due for refill today: yes  Requested medication (s) are on the active medication list: yes  Last refill:  08/11/18 for 90 tabs by Dr. Nolon Rod  Future visit scheduled: yes  Notes to clinic:  Dr. Nolon Rod refilled last prescription.  Requested Prescriptions  Pending Prescriptions Disp Refills   citalopram (CELEXA) 10 MG tablet [Pharmacy Med Name: CITALOPRAM HBR 10 MG TABLET] 90 tablet 0    Sig: TAKE 1 TABLET ONCE DAILY.     Psychiatry:  Antidepressants - SSRI Passed - 11/07/2018 12:58 PM      Passed - Valid encounter within last 6 months    Recent Outpatient Visits          1 week ago HYPERTENSION, BENIGN SYSTEMIC   [facility], Brayton Mars, MD   1 month ago Moderate persistent chronic asthma without complication   [facility] Regulus, Brayton Mars, MD   3 months ago Moderate persistent asthma with exacerbation   [facility] Regulus, Brayton Mars, MD   4 months ago Age-related osteoporosis without current pathological fracture    [facility] Reg[facility], Brayton Mars, MD   5 months ago Late onset Alzheimer's disease with behavioral disturbance   [facility], Brayton Mars, MD      Future Appointments            In 1 week Newt Minion, MD [facility]   In 3 months Yong Channel, Brayton Mars, MD [place], [place]

## 2018-11-10 DIAGNOSIS — J4540 Moderate persistent asthma, uncomplicated: Secondary | ICD-10-CM | POA: Diagnosis not present

## 2018-11-10 DIAGNOSIS — Z7984 Long term (current) use of oral hypoglycemic drugs: Secondary | ICD-10-CM | POA: Diagnosis not present

## 2018-11-10 DIAGNOSIS — L89152 Pressure ulcer of sacral region, stage 2: Secondary | ICD-10-CM | POA: Diagnosis not present

## 2018-11-10 DIAGNOSIS — G301 Alzheimer's disease with late onset: Secondary | ICD-10-CM | POA: Diagnosis not present

## 2018-11-10 DIAGNOSIS — F0280 Dementia in other diseases classified elsewhere without behavioral disturbance: Secondary | ICD-10-CM | POA: Diagnosis not present

## 2018-11-10 DIAGNOSIS — E1151 Type 2 diabetes mellitus with diabetic peripheral angiopathy without gangrene: Secondary | ICD-10-CM | POA: Diagnosis not present

## 2018-11-15 ENCOUNTER — Encounter: Payer: Self-pay | Admitting: Podiatry

## 2018-11-15 NOTE — Progress Notes (Signed)
Subjective: [name] is a 85 y.o. y.o. female who presents for preventative foot care today with PAD and cc of painful, discolored, thick toenails and painful callus/corn which interfere with daily activities. Pain is aggravated when wearing enclosed shoe gear. Pain is relieved with periodic professional debridement.  She is seen by Dr.Duda for ulcerations b/l feet. She has ulceration on right heel.  Marin Olp, MD is her PCP.   Current Outpatient Medications:  .  albuterol (PROAIR HFA) 108 (90 Base) MCG/ACT inhaler, 2 PUFFS EVERY 6 HOURS AS NEEDED FOR SHORTNESS OF BREATH., Disp: 17 g, Rfl: 5 .  albuterol (PROVENTIL) (2.5 MG/3ML) 0.083% nebulizer solution, USE THREE MILLILITERS VIA NEBULIZATION BY MOUTH EVERY 6 HOURS AS NEEDED FOR WHEEZING OR SHORTNESS OF BREATH, Disp: 75 mL, Rfl: 1 .  Ascorbic Acid (VITAMIN C) 1000 MG tablet, Take 1,000 mg by mouth daily., Disp: , Rfl:  .  aspirin 81 MG tablet, Take 81 mg by mouth daily., Disp: , Rfl:  .  azelastine (ASTELIN) 0.1 % nasal spray, Place 1 spray into both nostrils 2 (two) times daily. Use in each nostril as directed, Disp: 30 mL, Rfl: 11 .  brimonidine (ALPHAGAN P) 0.1 % SOLN, INSTILL 1 DROP IN BOTH EYE TWICE DAILY., Disp: , Rfl:  .  budesonide (PULMICORT) 0.25 MG/2ML nebulizer solution, Take 2 mLs (0.25 mg total) daily by nebulization. ICD10 Code-J45.909, Disp: 60 mL, Rfl: 12 .  calcium carbonate (OS-CAL) 600 MG TABS, Take 600 mg by mouth 2 (two) times daily with a meal., Disp: , Rfl:  .  donepezil (ARICEPT) 10 MG tablet, Take 1 tablet (10 mg total) by mouth at bedtime., Disp: 90 tablet, Rfl: 3 .  famotidine (PEPCID) 20 MG tablet, Take 1 tablet (20 mg total) by mouth 2 (two) times daily as needed for heartburn or indigestion., Disp: 60 tablet, Rfl: 5 .  IRON PO, Take 1 day or 1 dose by mouth., Disp: , Rfl:  .  levothyroxine (SYNTHROID, LEVOTHROID) 25 MCG tablet, Take by mouth., Disp: , Rfl:  .  memantine (NAMENDA) 10 MG tablet, Take 1  tablet (10 mg total) by mouth 2 (two) times daily., Disp: 60 tablet, Rfl: 5 .  Multiple Vitamin (MULTIVITAMIN WITH MINERALS) TABS tablet, Take 1 tablet by mouth daily., Disp: , Rfl:  .  mupirocin ointment (BACTROBAN) 2 %, Apply 1 application topically 2 (two) times daily. Apply to the affected area 2 times a day, Disp: 22 g, Rfl: 3 .  citalopram (CELEXA) 10 MG tablet, TAKE 1 TABLET ONCE DAILY., Disp: 90 tablet, Rfl: 0  No Known Allergies  Objective: Vascular Examination: Capillary refill time <3 seconds x 10 digits  Dorsalis pedis pulses 1/4  b/l  Posterior tibial pulses absent b/l  No digital hair x 10 digits  Skin temperature warm to cool b/l  Dermatological Examination: Skin thin, shiny and atrophic b/l  Toenails 1-5 b/l discolored, thick, dystrophic with subungual debris and pain with palpation to nailbeds due to thickness of nails.  Quarter sized pressure ulcer, stage 1 noted posterior right heel. No erythema, no edema, no drainage.   Musculoskeletal: Muscle strength 5/5 to all LE muscle groups  Neurological: Sensation diminished with 10 gram monofilament.  Assessment: 1. Painful onychomycosis toenails 1-5 b/l 2. Stage 1 pressure ulcer posterior right heel  Plan: 1. Recommended heel protector for right heel to be worn when in bed to assist ulceration in healing. 2. Toenails 1-5 b/l were debrided in length and girth without iatrogenic bleeding. 3.  Patient to continue soft, supportive shoe gear 4. Patient to report any pedal injuries to medical professional  5. Follow up 3 months. Patient/POA to call should there be a concern in the interim.

## 2018-11-17 ENCOUNTER — Other Ambulatory Visit: Payer: Self-pay

## 2018-11-17 ENCOUNTER — Ambulatory Visit (INDEPENDENT_AMBULATORY_CARE_PROVIDER_SITE_OTHER): Payer: Medicare Other | Admitting: Orthopedic Surgery

## 2018-11-17 ENCOUNTER — Encounter (INDEPENDENT_AMBULATORY_CARE_PROVIDER_SITE_OTHER): Payer: Self-pay | Admitting: Orthopedic Surgery

## 2018-11-17 VITALS — Ht 66.0 in | Wt 119.0 lb

## 2018-11-17 DIAGNOSIS — L97521 Non-pressure chronic ulcer of other part of left foot limited to breakdown of skin: Secondary | ICD-10-CM | POA: Diagnosis not present

## 2018-11-17 MED ORDER — NITROGLYCERIN 0.2 MG/HR TD PT24: 0.2000 mg | MEDICATED_PATCH | Freq: Every day | TRANSDERMAL | 12 refills | Status: DC

## 2018-11-17 MED ORDER — BUDESONIDE 0.25 MG/2ML IN SUSP: 0.2500 mg | Freq: Every day | RESPIRATORY_TRACT | 12 refills | Status: DC

## 2018-11-18 DIAGNOSIS — J4540 Moderate persistent asthma, uncomplicated: Secondary | ICD-10-CM | POA: Diagnosis not present

## 2018-11-18 DIAGNOSIS — G301 Alzheimer's disease with late onset: Secondary | ICD-10-CM | POA: Diagnosis not present

## 2018-11-18 DIAGNOSIS — L89152 Pressure ulcer of sacral region, stage 2: Secondary | ICD-10-CM | POA: Diagnosis not present

## 2018-11-18 DIAGNOSIS — F0280 Dementia in other diseases classified elsewhere without behavioral disturbance: Secondary | ICD-10-CM | POA: Diagnosis not present

## 2018-11-18 DIAGNOSIS — Z7984 Long term (current) use of oral hypoglycemic drugs: Secondary | ICD-10-CM | POA: Diagnosis not present

## 2018-11-18 DIAGNOSIS — E1151 Type 2 diabetes mellitus with diabetic peripheral angiopathy without gangrene: Secondary | ICD-10-CM | POA: Diagnosis not present

## 2018-11-23 ENCOUNTER — Encounter (INDEPENDENT_AMBULATORY_CARE_PROVIDER_SITE_OTHER): Payer: Self-pay | Admitting: Orthopedic Surgery

## 2018-11-23 NOTE — Progress Notes (Signed)
Office Visit Note   Patient: [name]           Date of Birth: September 04, 1931           MRN: [id number] Visit Date: 11/17/2018              Requested by: Marin Olp, MD [place], [place] [address] PCP: Marin Olp, MD  Chief Complaint  Patient presents with  . Left Foot - Follow-up      HPI: Patient is a 85 year old woman who presents in follow-up for ischemic ulcer left foot.  Assessment & Plan: Visit Diagnoses:  1. Ulcer of toe of left foot, limited to breakdown of skin ([place])     Plan: Will have patient use a nitroglycerin patch daily to improve the microcirculation.  Follow-Up Instructions: Return in about 4 weeks (around 12/15/2018).   Ortho Exam  Patient is alert, oriented, no adenopathy, well-dressed, normal affect, normal respiratory effort. Examination patient has a Medical illustrator grade 1 ulcer beneath the first metatarsal head with some mild ischemic changes there is no cellulitis no drainage no odor no exposed bone or tendon.  The ulcer is approximately 2 cm diameter 1 mm deep she does have palpable pulses.  Imaging: No results found. No images are attached to the encounter.  Labs: Lab Results  Component Value Date   HGBA1C 6.6 (H) 10/01/2018   HGBA1C 7.2 (A) 05/01/2018   HGBA1C 7.1 (H) 12/31/2017   ESRSEDRATE 4 11/17/2014   REPTSTATUS 07/21/2009 FINAL 07/20/2009   REPTSTATUS 07/21/2009 FINAL 07/20/2009   CULT  07/20/2009    Multiple bacterial morphotypes present, none predominant. Suggest appropriate recollection if clinically indicated.   LABORGA ESCHERICHIA COLI 08/13/2016     Lab Results  Component Value Date   ALBUMIN 3.6 10/01/2018   ALBUMIN 3.4 (L) 05/01/2018   ALBUMIN 3.4 (L) 12/31/2017    Body mass index is 19.21 kg/m.  Orders:  No orders of the defined types were placed in this encounter.  Meds ordered this encounter  Medications  . nitroGLYCERIN (NITRODUR - DOSED IN MG/24 HR) 0.2 mg/hr patch    Sig: Place 1  patch (0.2 mg total) onto the skin daily.    Dispense:  30 patch    Refill:  12     Procedures: No procedures performed  Clinical Data: No additional findings.  ROS:  All other systems negative, except as noted in the HPI. Review of Systems  Objective: Vital Signs: Ht 5\' 6"  (1.676 m)   Wt 119 lb (54 kg)   LMP  (LMP Unknown)   BMI 19.21 kg/m   Specialty Comments:  No specialty comments available.  PMFS History: Patient Active Problem List   Diagnosis Date Noted  . Protein-calorie malnutrition ([place]) 07/21/2018  . Osteoporosis 07/02/2018  . Diabetic ulcer of foot with fat layer exposed ([place]) 05/14/2018  . GERD (gastroesophageal reflux disease) 05/02/2018  . Generalized weakness 05/02/2018  . History of DVT in adulthood 06/26/2017  . LBBB (left bundle branch block) 10/09/2016  . Pleural effusion, left 09/23/2016  . Coronary artery calcification seen on CAT scan 08/28/2016  . Alzheimer's disease ([place]) 01/04/2016  . Pancreas cyst 07/02/2015  . Asthma, chronic 03/16/2015  . Primary open angle glaucoma of left eye, moderate stage 12/25/2014  . HTN (hypertension) 09/06/2013  . Pseudophakia of both eyes 02/15/2013  . Anemia 05/25/2012  . Astigmatism 04/29/2012  . Allergic rhinitis 10/30/2010  . Venous (peripheral) insufficiency 04/13/2007  . Diabetes ([place]) 12/18/2006  .  HYPERTENSION, BENIGN SYSTEMIC 12/18/2006  . Atherosclerosis of native arteries of extremity with intermittent claudication ([place]) 12/18/2006  . OSTEOARTHRITIS OF SPINE, NOS 12/18/2006   Past Medical History:  Diagnosis Date  . Alzheimer's dementia without behavioral disturbance ([place])    diagnosed 12/2015; Dr. Jade/neurology  . Anemia   . Asthma   . Blood transfusion 05-24-12  . Blood transfusion without reported diagnosis   . Chronic kidney disease    dr Clover Mealy  . Diabetes mellitus   . Diverticulosis   . DVT (deep venous thrombosis) ([place]) 03/2005   hx.left leg  . Esophageal stricture   . GERD  (gastroesophageal reflux disease)   . Glaucoma   . Hiatal hernia 05/07/13  . Hyperlipidemia   . Hypertension   . Osteoarthritis   . Pancreatic duct dilated 05/07/13  . Pulmonary nodules 05/07/13  . PVD (peripheral vascular disease) ([place])   . Tubular adenoma of colon     Family History  Adopted: Yes  Problem Relation Age of Onset  . Migraines Daughter   . Cancer Son   . Colon cancer Neg Hx     Past Surgical History:  Procedure Laterality Date  . CATARACT EXTRACTION, BILATERAL    . COLONOSCOPY  05/27/2012   Procedure: COLONOSCOPY;  Surgeon: Lafayette Dragon, MD;  Location: [facility];  Service: Endoscopy;  Laterality: N/A;  . ESOPHAGOGASTRODUODENOSCOPY  05/26/2012   Procedure: ESOPHAGOGASTRODUODENOSCOPY (EGD);  Surgeon: Lafayette Dragon, MD;  Location: [facility];  Service: Endoscopy;  Laterality: N/A;  . EUS N/A 07/08/2013   Procedure: UPPER ENDOSCOPIC ULTRASOUND (EUS) LINEAR;  Surgeon: Milus Banister, MD;  Location: [facility];  Service: Endoscopy;  Laterality: N/A;  need side view scope  . EYE SURGERY    . NM MYOVIEW LTD  10/2016   Read as intermediate risk due to EF of 38%.Anemia or infarction noted. Only abnormal septal WM.   Marland Kitchen TRANSTHORACIC ECHOCARDIOGRAM  10/2016    EF 45-50% with possible inferior hypokinesis. Aortic sclerosis without stenosis (ordered to reassess EF from Myoview)  . VASCULAR SURGERY  09/2004  . WRIST FRACTURE SURGERY     R wrist fracture, plate   Social History   Occupational History  . Not on file  Tobacco Use  . Smoking status: Never Smoker  . Smokeless tobacco: Never Used  Substance and Sexual Activity  . Alcohol use: No  . Drug use: No  . Sexual activity: Not Currently

## 2018-11-28 DIAGNOSIS — F0280 Dementia in other diseases classified elsewhere without behavioral disturbance: Secondary | ICD-10-CM | POA: Diagnosis not present

## 2018-11-28 DIAGNOSIS — J4540 Moderate persistent asthma, uncomplicated: Secondary | ICD-10-CM | POA: Diagnosis not present

## 2018-11-28 DIAGNOSIS — L89152 Pressure ulcer of sacral region, stage 2: Secondary | ICD-10-CM | POA: Diagnosis not present

## 2018-11-28 DIAGNOSIS — G301 Alzheimer's disease with late onset: Secondary | ICD-10-CM | POA: Diagnosis not present

## 2018-11-28 DIAGNOSIS — E1151 Type 2 diabetes mellitus with diabetic peripheral angiopathy without gangrene: Secondary | ICD-10-CM | POA: Diagnosis not present

## 2018-11-28 DIAGNOSIS — Z7984 Long term (current) use of oral hypoglycemic drugs: Secondary | ICD-10-CM | POA: Diagnosis not present

## 2018-12-01 ENCOUNTER — Telehealth: Payer: Self-pay | Admitting: Family Medicine

## 2018-12-01 DIAGNOSIS — Z7984 Long term (current) use of oral hypoglycemic drugs: Secondary | ICD-10-CM | POA: Diagnosis not present

## 2018-12-01 DIAGNOSIS — G301 Alzheimer's disease with late onset: Secondary | ICD-10-CM | POA: Diagnosis not present

## 2018-12-01 DIAGNOSIS — L89152 Pressure ulcer of sacral region, stage 2: Secondary | ICD-10-CM | POA: Diagnosis not present

## 2018-12-01 DIAGNOSIS — E1151 Type 2 diabetes mellitus with diabetic peripheral angiopathy without gangrene: Secondary | ICD-10-CM | POA: Diagnosis not present

## 2018-12-01 DIAGNOSIS — J4540 Moderate persistent asthma, uncomplicated: Secondary | ICD-10-CM | POA: Diagnosis not present

## 2018-12-01 DIAGNOSIS — F0280 Dementia in other diseases classified elsewhere without behavioral disturbance: Secondary | ICD-10-CM | POA: Diagnosis not present

## 2018-12-01 NOTE — Telephone Encounter (Signed)
See note  Copied from [place] [id number]. Topic: Quick Communication - Home Health Verbal Orders >> Dec 01, 2018 12:19 PM Judyann Munson wrote: Caller/Agency: [facility]  Callback Number: [phone]    Will like to continue Care for 4 more weeks due to the patients wound re-opening.    Please advise

## 2018-12-01 NOTE — Telephone Encounter (Signed)
Called and provided verbal orders as requested 

## 2018-12-04 DIAGNOSIS — J4540 Moderate persistent asthma, uncomplicated: Secondary | ICD-10-CM | POA: Diagnosis not present

## 2018-12-04 DIAGNOSIS — L89152 Pressure ulcer of sacral region, stage 2: Secondary | ICD-10-CM | POA: Diagnosis not present

## 2018-12-04 DIAGNOSIS — M6281 Muscle weakness (generalized): Secondary | ICD-10-CM | POA: Diagnosis not present

## 2018-12-04 DIAGNOSIS — E1151 Type 2 diabetes mellitus with diabetic peripheral angiopathy without gangrene: Secondary | ICD-10-CM | POA: Diagnosis not present

## 2018-12-04 DIAGNOSIS — F0280 Dementia in other diseases classified elsewhere without behavioral disturbance: Secondary | ICD-10-CM | POA: Diagnosis not present

## 2018-12-04 DIAGNOSIS — G301 Alzheimer's disease with late onset: Secondary | ICD-10-CM | POA: Diagnosis not present

## 2018-12-04 DIAGNOSIS — I10 Essential (primary) hypertension: Secondary | ICD-10-CM | POA: Diagnosis not present

## 2018-12-04 DIAGNOSIS — R2689 Other abnormalities of gait and mobility: Secondary | ICD-10-CM | POA: Diagnosis not present

## 2018-12-04 DIAGNOSIS — Z7984 Long term (current) use of oral hypoglycemic drugs: Secondary | ICD-10-CM | POA: Diagnosis not present

## 2018-12-04 DIAGNOSIS — Z86718 Personal history of other venous thrombosis and embolism: Secondary | ICD-10-CM | POA: Diagnosis not present

## 2018-12-08 ENCOUNTER — Other Ambulatory Visit: Payer: Self-pay

## 2018-12-08 ENCOUNTER — Other Ambulatory Visit: Payer: Self-pay | Admitting: Family Medicine

## 2018-12-08 ENCOUNTER — Telehealth: Payer: Self-pay | Admitting: Family Medicine

## 2018-12-08 DIAGNOSIS — J4540 Moderate persistent asthma, uncomplicated: Secondary | ICD-10-CM | POA: Diagnosis not present

## 2018-12-08 DIAGNOSIS — L89152 Pressure ulcer of sacral region, stage 2: Secondary | ICD-10-CM | POA: Diagnosis not present

## 2018-12-08 DIAGNOSIS — Z7984 Long term (current) use of oral hypoglycemic drugs: Secondary | ICD-10-CM | POA: Diagnosis not present

## 2018-12-08 DIAGNOSIS — F0280 Dementia in other diseases classified elsewhere without behavioral disturbance: Secondary | ICD-10-CM | POA: Diagnosis not present

## 2018-12-08 DIAGNOSIS — G301 Alzheimer's disease with late onset: Secondary | ICD-10-CM | POA: Diagnosis not present

## 2018-12-08 DIAGNOSIS — E1151 Type 2 diabetes mellitus with diabetic peripheral angiopathy without gangrene: Secondary | ICD-10-CM | POA: Diagnosis not present

## 2018-12-08 MED ORDER — BUDESONIDE 0.25 MG/2ML IN SUSP: 0.2500 mg | Freq: Every day | RESPIRATORY_TRACT | 12 refills | Status: DC

## 2018-12-08 MED ORDER — ALBUTEROL SULFATE (2.5 MG/3ML) 0.083% IN NEBU: INHALATION_SOLUTION | RESPIRATORY_TRACT | 1 refills | Status: DC

## 2018-12-08 NOTE — Telephone Encounter (Signed)
See note  Copied from [place] [id number]. Topic: General - Other >> Dec 08, 2018 11:26 AM Antonieta Iba C wrote: Reason for CRM: pharmacy called in to make office/provider aware that the Rx for budesonide (PULMICORT) 0.25 MG/2ML nebulizer solution wasn't received although showing in chart that it was received. Chris with pharmacy would like to have Rx resent.  [facility]: [facility] [address] [place] [phone] (Phone) [phone] (Fax)

## 2018-12-08 NOTE — Telephone Encounter (Signed)
Rx resent to [facility].

## 2018-12-15 ENCOUNTER — Ambulatory Visit (INDEPENDENT_AMBULATORY_CARE_PROVIDER_SITE_OTHER): Payer: Medicare Other | Admitting: Orthopedic Surgery

## 2018-12-15 DIAGNOSIS — E1151 Type 2 diabetes mellitus with diabetic peripheral angiopathy without gangrene: Secondary | ICD-10-CM | POA: Diagnosis not present

## 2018-12-15 DIAGNOSIS — Z7984 Long term (current) use of oral hypoglycemic drugs: Secondary | ICD-10-CM | POA: Diagnosis not present

## 2018-12-15 DIAGNOSIS — L89152 Pressure ulcer of sacral region, stage 2: Secondary | ICD-10-CM | POA: Diagnosis not present

## 2018-12-15 DIAGNOSIS — G301 Alzheimer's disease with late onset: Secondary | ICD-10-CM | POA: Diagnosis not present

## 2018-12-15 DIAGNOSIS — F0280 Dementia in other diseases classified elsewhere without behavioral disturbance: Secondary | ICD-10-CM | POA: Diagnosis not present

## 2018-12-15 DIAGNOSIS — J4540 Moderate persistent asthma, uncomplicated: Secondary | ICD-10-CM | POA: Diagnosis not present

## 2018-12-18 ENCOUNTER — Other Ambulatory Visit: Payer: Self-pay

## 2018-12-18 MED ORDER — ALBUTEROL SULFATE HFA 108 (90 BASE) MCG/ACT IN AERS: INHALATION_SPRAY | RESPIRATORY_TRACT | 5 refills | Status: AC

## 2018-12-21 DIAGNOSIS — H401122 Primary open-angle glaucoma, left eye, moderate stage: Secondary | ICD-10-CM | POA: Diagnosis not present

## 2018-12-22 ENCOUNTER — Ambulatory Visit (INDEPENDENT_AMBULATORY_CARE_PROVIDER_SITE_OTHER): Payer: Medicare Other | Admitting: Orthopedic Surgery

## 2018-12-22 ENCOUNTER — Encounter (INDEPENDENT_AMBULATORY_CARE_PROVIDER_SITE_OTHER): Payer: Self-pay | Admitting: Orthopedic Surgery

## 2018-12-22 DIAGNOSIS — L97521 Non-pressure chronic ulcer of other part of left foot limited to breakdown of skin: Secondary | ICD-10-CM

## 2018-12-22 DIAGNOSIS — L97422 Non-pressure chronic ulcer of left heel and midfoot with fat layer exposed: Secondary | ICD-10-CM

## 2018-12-22 DIAGNOSIS — E11621 Type 2 diabetes mellitus with foot ulcer: Secondary | ICD-10-CM

## 2018-12-22 NOTE — Progress Notes (Signed)
Office Visit Note   Patient: [name]           Date of Birth: 1930-11-10           MRN: [id number] Visit Date: 12/22/2018              Requested by: Marin Olp, MD [place], [place] [address] PCP: Marin Olp, MD  Chief Complaint  Patient presents with  . Left Foot - Follow-up      HPI: Patient is a 85 year old woman who presents in follow-up for ulcerations to her left foot.  Patient has increasing Alzheimer's dementia.  Assessment & Plan: Visit Diagnoses:  1. Ulcer of toe of left foot, limited to breakdown of skin ([place])   2. Diabetic ulcer of left heel associated with type 2 diabetes mellitus, with fat layer exposed ([place])     Plan: Patient may discontinue the nitroglycerin patch there is no ulcers on either foot follow-up as needed.  Follow-Up Instructions: Return if symptoms worsen or fail to improve.   Ortho Exam  Patient is alert, oriented, no adenopathy, well-dressed, normal affect, normal respiratory effort. Examination patient's feet have no ischemic changes no ulcers.  The skin is intact there is no cellulitis.  She has some redness to the tips of her great toe bilaterally however shoewear shows that she has plenty of room within the shoe.  Imaging: No results found. No images are attached to the encounter.  Labs: Lab Results  Component Value Date   HGBA1C 6.6 (H) 10/01/2018   HGBA1C 7.2 (A) 05/01/2018   HGBA1C 7.1 (H) 12/31/2017   ESRSEDRATE 4 11/17/2014   REPTSTATUS 07/21/2009 FINAL 07/20/2009   REPTSTATUS 07/21/2009 FINAL 07/20/2009   CULT  07/20/2009    Multiple bacterial morphotypes present, none predominant. Suggest appropriate recollection if clinically indicated.   LABORGA ESCHERICHIA COLI 08/13/2016     Lab Results  Component Value Date   ALBUMIN 3.6 10/01/2018   ALBUMIN 3.4 (L) 05/01/2018   ALBUMIN 3.4 (L) 12/31/2017    There is no height or weight on file to calculate BMI.  Orders:  No orders of the  defined types were placed in this encounter.  No orders of the defined types were placed in this encounter.    Procedures: No procedures performed  Clinical Data: No additional findings.  ROS:  All other systems negative, except as noted in the HPI. Review of Systems  Objective: Vital Signs: LMP  (LMP Unknown)   Specialty Comments:  No specialty comments available.  PMFS History: Patient Active Problem List   Diagnosis Date Noted  . Protein-calorie malnutrition ([place]) 07/21/2018  . Osteoporosis 07/02/2018  . Diabetic ulcer of foot with fat layer exposed ([place]) 05/14/2018  . GERD (gastroesophageal reflux disease) 05/02/2018  . Generalized weakness 05/02/2018  . History of DVT in adulthood 06/26/2017  . LBBB (left bundle branch block) 10/09/2016  . Pleural effusion, left 09/23/2016  . Coronary artery calcification seen on CAT scan 08/28/2016  . Alzheimer's disease ([place]) 01/04/2016  . Pancreas cyst 07/02/2015  . Asthma, chronic 03/16/2015  . Primary open angle glaucoma of left eye, moderate stage 12/25/2014  . HTN (hypertension) 09/06/2013  . Pseudophakia of both eyes 02/15/2013  . Anemia 05/25/2012  . Astigmatism 04/29/2012  . Allergic rhinitis 10/30/2010  . Venous (peripheral) insufficiency 04/13/2007  . Diabetes ([place]) 12/18/2006  . HYPERTENSION, BENIGN SYSTEMIC 12/18/2006  . Atherosclerosis of native arteries of extremity with intermittent claudication ([place]) 12/18/2006  . OSTEOARTHRITIS OF  SPINE, NOS 12/18/2006   Past Medical History:  Diagnosis Date  . Alzheimer's dementia without behavioral disturbance ([place])    diagnosed 12/2015; Dr. Jade/neurology  . Anemia   . Asthma   . Blood transfusion 05-24-12  . Blood transfusion without reported diagnosis   . Chronic kidney disease    dr Clover Mealy  . Diabetes mellitus   . Diverticulosis   . DVT (deep venous thrombosis) ([place]) 03/2005   hx.left leg  . Esophageal stricture   . GERD (gastroesophageal reflux disease)     . Glaucoma   . Hiatal hernia 05/07/13  . Hyperlipidemia   . Hypertension   . Osteoarthritis   . Pancreatic duct dilated 05/07/13  . Pulmonary nodules 05/07/13  . PVD (peripheral vascular disease) ([place])   . Tubular adenoma of colon     Family History  Adopted: Yes  Problem Relation Age of Onset  . Migraines Daughter   . Cancer Son   . Colon cancer Neg Hx     Past Surgical History:  Procedure Laterality Date  . CATARACT EXTRACTION, BILATERAL    . COLONOSCOPY  05/27/2012   Procedure: COLONOSCOPY;  Surgeon: Lafayette Dragon, MD;  Location: [facility];  Service: Endoscopy;  Laterality: N/A;  . ESOPHAGOGASTRODUODENOSCOPY  05/26/2012   Procedure: ESOPHAGOGASTRODUODENOSCOPY (EGD);  Surgeon: Lafayette Dragon, MD;  Location: [facility];  Service: Endoscopy;  Laterality: N/A;  . EUS N/A 07/08/2013   Procedure: UPPER ENDOSCOPIC ULTRASOUND (EUS) LINEAR;  Surgeon: Milus Banister, MD;  Location: [facility];  Service: Endoscopy;  Laterality: N/A;  need side view scope  . EYE SURGERY    . NM MYOVIEW LTD  10/2016   Read as intermediate risk due to EF of 38%.Anemia or infarction noted. Only abnormal septal WM.   Marland Kitchen TRANSTHORACIC ECHOCARDIOGRAM  10/2016    EF 45-50% with possible inferior hypokinesis. Aortic sclerosis without stenosis (ordered to reassess EF from Myoview)  . VASCULAR SURGERY  09/2004  . WRIST FRACTURE SURGERY     R wrist fracture, plate   Social History   Occupational History  . Not on file  Tobacco Use  . Smoking status: Never Smoker  . Smokeless tobacco: Never Used  Substance and Sexual Activity  . Alcohol use: No  . Drug use: No  . Sexual activity: Not Currently

## 2018-12-23 DIAGNOSIS — G301 Alzheimer's disease with late onset: Secondary | ICD-10-CM | POA: Diagnosis not present

## 2018-12-23 DIAGNOSIS — F0280 Dementia in other diseases classified elsewhere without behavioral disturbance: Secondary | ICD-10-CM | POA: Diagnosis not present

## 2018-12-23 DIAGNOSIS — J4540 Moderate persistent asthma, uncomplicated: Secondary | ICD-10-CM | POA: Diagnosis not present

## 2018-12-23 DIAGNOSIS — L89152 Pressure ulcer of sacral region, stage 2: Secondary | ICD-10-CM | POA: Diagnosis not present

## 2018-12-23 DIAGNOSIS — Z7984 Long term (current) use of oral hypoglycemic drugs: Secondary | ICD-10-CM | POA: Diagnosis not present

## 2018-12-23 DIAGNOSIS — E1151 Type 2 diabetes mellitus with diabetic peripheral angiopathy without gangrene: Secondary | ICD-10-CM | POA: Diagnosis not present

## 2018-12-29 DIAGNOSIS — G301 Alzheimer's disease with late onset: Secondary | ICD-10-CM | POA: Diagnosis not present

## 2018-12-29 DIAGNOSIS — L89152 Pressure ulcer of sacral region, stage 2: Secondary | ICD-10-CM | POA: Diagnosis not present

## 2018-12-29 DIAGNOSIS — J4540 Moderate persistent asthma, uncomplicated: Secondary | ICD-10-CM | POA: Diagnosis not present

## 2018-12-29 DIAGNOSIS — F0280 Dementia in other diseases classified elsewhere without behavioral disturbance: Secondary | ICD-10-CM | POA: Diagnosis not present

## 2018-12-29 DIAGNOSIS — Z7984 Long term (current) use of oral hypoglycemic drugs: Secondary | ICD-10-CM | POA: Diagnosis not present

## 2018-12-29 DIAGNOSIS — E1151 Type 2 diabetes mellitus with diabetic peripheral angiopathy without gangrene: Secondary | ICD-10-CM | POA: Diagnosis not present

## 2019-01-22 ENCOUNTER — Ambulatory Visit: Payer: Medicare Other | Admitting: Podiatry

## 2019-01-25 ENCOUNTER — Other Ambulatory Visit: Payer: Self-pay | Admitting: Neurology

## 2019-02-06 ENCOUNTER — Other Ambulatory Visit: Payer: Self-pay | Admitting: Family Medicine
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
## 2019-02-10 ENCOUNTER — Other Ambulatory Visit: Payer: Self-pay

## 2019-02-10 ENCOUNTER — Encounter: Payer: Self-pay | Admitting: Neurology

## 2019-02-10 ENCOUNTER — Telehealth (INDEPENDENT_AMBULATORY_CARE_PROVIDER_SITE_OTHER): Payer: Medicare Other | Admitting: Neurology

## 2019-02-10 VITALS — BP 156/76 | Ht 66.0 in

## 2019-02-10 DIAGNOSIS — F0281 Dementia in other diseases classified elsewhere with behavioral disturbance: Secondary | ICD-10-CM

## 2019-02-10 DIAGNOSIS — R030 Elevated blood-pressure reading, without diagnosis of hypertension: Secondary | ICD-10-CM

## 2019-02-10 DIAGNOSIS — G301 Alzheimer's disease with late onset: Secondary | ICD-10-CM

## 2019-02-10 NOTE — Patient Instructions (Signed)
Continue Aricept and Namenda Follow up in 6 months.

## 2019-02-11 ENCOUNTER — Encounter: Payer: Self-pay | Admitting: Family Medicine

## 2019-02-11 ENCOUNTER — Ambulatory Visit (INDEPENDENT_AMBULATORY_CARE_PROVIDER_SITE_OTHER): Payer: Medicare Other | Admitting: Family Medicine

## 2019-02-11 ENCOUNTER — Ambulatory Visit
Admission: RE | Admit: 2019-02-11 | Discharge: 2019-02-11 | Disposition: A | Discharge status: Home / Self Care | Payer: Medicare Other | Source: Ambulatory Visit | Attending: Family Medicine | Admitting: Family Medicine

## 2019-02-11 ENCOUNTER — Telehealth: Payer: Self-pay | Admitting: Family Medicine

## 2019-02-11 VITALS — BP 89/63 | HR 81 | Temp 97.4°F | Ht 66.0 in | Wt 118.0 lb

## 2019-02-11 DIAGNOSIS — R410 Disorientation, unspecified: Secondary | ICD-10-CM | POA: Diagnosis not present

## 2019-02-11 DIAGNOSIS — E119 Type 2 diabetes mellitus without complications: Secondary | ICD-10-CM | POA: Diagnosis not present

## 2019-02-11 DIAGNOSIS — R8290 Unspecified abnormal findings in urine: Secondary | ICD-10-CM | POA: Diagnosis not present

## 2019-02-11 DIAGNOSIS — G44319 Acute post-traumatic headache, not intractable: Secondary | ICD-10-CM

## 2019-02-11 DIAGNOSIS — R51 Headache: Secondary | ICD-10-CM | POA: Diagnosis not present

## 2019-02-11 DIAGNOSIS — J4540 Moderate persistent asthma, uncomplicated: Secondary | ICD-10-CM | POA: Diagnosis not present

## 2019-02-11 DIAGNOSIS — E1159 Type 2 diabetes mellitus with other circulatory complications: Secondary | ICD-10-CM | POA: Diagnosis not present

## 2019-02-11 DIAGNOSIS — I152 Hypertension secondary to endocrine disorders: Secondary | ICD-10-CM

## 2019-02-11 DIAGNOSIS — I10 Essential (primary) hypertension: Secondary | ICD-10-CM

## 2019-02-11 NOTE — Telephone Encounter (Signed)
Dr. Yong Channel, [place] to schedule for tomorrow, schedule with Dr. Jerline Pain today, or work in with you today?

## 2019-02-11 NOTE — Telephone Encounter (Signed)
Patients husband called in and wants to have a virtual visit or a Telephone call since he said her health has changed in the last 12 hrs. Patients husband would like a call back at [phone].

## 2019-02-11 NOTE — Progress Notes (Signed)
Phone [phone]   Subjective:  Virtual visit via Video note. Chief complaint: Chief Complaint  Patient presents with   Fall   Headache   Altered Mental Status   This visit type was conducted due to national recommendations for restrictions regarding the COVID-19 Pandemic (e.g. social distancing).  This format is felt to be most appropriate for this patient at this time balancing risks to patient and risks to population by having him in for in person visit.  No physical exam was performed (except for noted visual exam or audio findings with Telehealth visits).    Our team/I connected with [name] on 02/11/19 at 11:40 AM EDT by a video enabled telemedicine application (doxy.me) and verified that I am speaking with the correct person using two identifiers.  Location patient: Home-O2 Location provider: [facility], office Persons participating in the virtual visit:  patient  Our team/I discussed the limitations of evaluation and management by telemedicine and the availability of in person appointments. In light of current covid-19 pandemic, patient also understands that we are trying to protect them by minimizing in office contact if at all possible.  The patient expressed consent for telemedicine visit and agreed to proceed. Patient understands insurance will be billed.   ROS- headache and confusion noted. Foul smelling urine.  Incontinent at baseline.  No reported chest pain, shortness of breath, increased edema  Past Medical History-  Patient Active Problem List   Diagnosis Date Noted   History of DVT in adulthood 06/26/2017    Priority: High   Coronary artery calcification seen on CAT scan 08/28/2016    Priority: High   Alzheimer's disease ([place]) 01/04/2016    Priority: High   Diabetes ([place]) 12/18/2006    Priority: High   Atherosclerosis of native arteries of extremity with intermittent claudication ([place]) 12/18/2006    Priority: High   Generalized weakness  05/02/2018    Priority: Medium   Pancreas cyst 07/02/2015    Priority: Medium   Asthma, chronic 03/16/2015    Priority: Medium   Primary open angle glaucoma of left eye, moderate stage 12/25/2014    Priority: Medium   HYPERTENSION, BENIGN SYSTEMIC 12/18/2006    Priority: Medium   GERD (gastroesophageal reflux disease) 05/02/2018    Priority: Low   LBBB (left bundle branch block) 10/09/2016    Priority: Low   Pleural effusion, left 09/23/2016    Priority: Low   Pseudophakia of both eyes 02/15/2013    Priority: Low   Anemia 05/25/2012    Priority: Low   Astigmatism 04/29/2012    Priority: Low   Allergic rhinitis 10/30/2010    Priority: Low   Venous (peripheral) insufficiency 04/13/2007    Priority: Low   OSTEOARTHRITIS OF SPINE, NOS 12/18/2006    Priority: Low   Protein-calorie malnutrition ([place]) 07/21/2018   Osteoporosis 07/02/2018   Diabetic ulcer of foot with fat layer exposed ([place]) 05/14/2018   HTN (hypertension) 09/06/2013    Medications- reviewed and updated Current Outpatient Medications  Medication Sig Dispense Refill   albuterol (PROAIR HFA) 108 (90 Base) MCG/ACT inhaler 2 PUFFS EVERY 6 HOURS AS NEEDED FOR SHORTNESS OF BREATH. 17 g 5   albuterol (PROVENTIL) (2.5 MG/3ML) 0.083% nebulizer solution USE THREE MILLILITERS VIA NEBULIZATION BY MOUTH EVERY 6 HOURS AS NEEDED FOR WHEEZING OR SHORTNESS OF BREATH 300 mL 1   Ascorbic Acid (VITAMIN C) 1000 MG tablet Take 1,000 mg by mouth daily.     aspirin 81 MG tablet Take 81 mg by mouth daily.  azelastine (ASTELIN) 0.1 % nasal spray Place 1 spray into both nostrils 2 (two) times daily. Use in each nostril as directed 30 mL 11   brimonidine (ALPHAGAN P) 0.1 % SOLN INSTILL 1 DROP IN BOTH EYE TWICE DAILY.     budesonide (PULMICORT) 0.25 MG/2ML nebulizer solution Take 2 mLs (0.25 mg total) by nebulization daily. ICD10 Code-J45.909 60 mL 12   calcium carbonate (OS-CAL) 600 MG TABS Take 600 mg by mouth 2  (two) times daily with a meal.     citalopram (CELEXA) 10 MG tablet TAKE 1 TABLET ONCE DAILY. 90 tablet 0   donepezil (ARICEPT) 10 MG tablet Take 1 tablet (10 mg total) by mouth at bedtime. 90 tablet 3   famotidine (PEPCID) 20 MG tablet Take 1 tablet (20 mg total) by mouth 2 (two) times daily as needed for heartburn or indigestion. 60 tablet 5   IRON PO Take 1 day or 1 dose by mouth.     levothyroxine (SYNTHROID, LEVOTHROID) 25 MCG tablet Take by mouth.     memantine (NAMENDA) 10 MG tablet TAKE 1 TABLET TWICE DAILY FOR MEMORY. 60 tablet 5   Multiple Vitamin (MULTIVITAMIN WITH MINERALS) TABS tablet Take 1 tablet by mouth daily.     mupirocin ointment (BACTROBAN) 2 % Apply 1 application topically 2 (two) times daily. Apply to the affected area 2 times a day 22 g 3   nitroGLYCERIN (NITRODUR - DOSED IN MG/24 HR) 0.2 mg/hr patch Place 1 patch (0.2 mg total) onto the skin daily. 30 patch 12   No current facility-administered medications for this visit.      Objective:  BP (!) 89/63 Comment: Am reading   Pulse 81    Temp (!) 97.4 F (36.3 C) (Oral)    Ht 5\' 6"  (1.676 m)    Wt 118 lb (53.5 kg)    LMP  (LMP Unknown)    BMI 19.05 kg/m  Gen: NAD, resting comfortably in chair-participates well in conversation but later falls asleep when conversation is not directed directly at her Lungs: nonlabored, normal respiratory rate  Skin: appears dry, no obvious rash Patient states she does not know who I am but with her baseline dementia-not a significant change.  She does know who her daughter and husband are.    Assessment and Plan   #Fall/headache/confusion/foul-smelling urine S:  Patient fell down yesterday (was getting up trying to work on a phone and she usually doesn't get up on her own- husband had briefly stepped out of home)- she got up for about 12 minutes standing still on her legs and then had a slow fall down to the ground- was able to hold onto something (family able to tell by video  camera). Fell to bottom first and then fell back and hit her head on the ground. She was on ground 30 minutes around 5 30 to 6 pm yesterday. Fall was on carpet. She takes an 81 mg aspirin.   In the middle of the night- she was sleeping in her recliner which is typical. Last night aroudn 10 30 PM started asking her about things from 1995- went through an occurrence in detail and went on until 11 PM. 2 Am this morning was  Having a dream and was talking outloud that she was babysitting for a friends  Child- had to cut lights on and get her up.  This morning-stated grits were making noises that they were not.  Family is concerned this was a hallucination cant really stand  on her feet.   This morning has been lethargic/weak and couldn't walk. She was complaining about her legs feeling weak. Left shoulder- was complaining about this hurting. Feels like butt, fingers, head, feet hurts.   Nurse who cares for her notes that urine seems to be more foul-smelling than normal.  Incontinent- changes twice a day.   She seems to have perked up in last 15 minutes- blood sugar may have been low- they got some OJ in her.  A/P: 85 year old female with fall and hitting her head yesterday complaining of headache-she has difficult time describing intensity or location. -I recommended ER evaluation-family declines given concern of COVID-19 -Fortunately she seems to be improving per family-seems more alert and less confused-more back to baseline - They agree if she has worsening symptoms to take her- they would like to hold off for now given she seems to be improving somewhat. - They will collect the urine-they have a cup and hat that they think they can collect with.  We will get UA and urine culture -Stat head CT ordered to rule out intracranial bleed. -Continue to push for fluids and solids intake-possible hypoglycemia contributing to symptoms. -Unable to get adequate neurological exam- this would be limited in person as  well due to dementia.  Depending on her trajectory we may have to have her come in for further evaluation.  Doubt COVID-19 as cause but this is certainly possible and sometimes geriatric presentation is vary-from my understanding she has been homebound though.  Comment on other chronic conditions- patient does have diabetes but has been well controlled-doubt hypoglycemia as cause-hypoglycemia could be contributing due to poor intake; hypertension- low on home checks despite not being on medication- we will monitor-if this worsens or she does not improve we did not find underlying etiology for symptoms-would increase probability of bacterial infection- UTI is 1 possibility-trying to treat/assess that.  In regards to her asthma- no breathing issues reported and compliant with Pulmicort.  Stable-continue current medication.  Has PAD-will remain on aspirin unless intracranial hemorrhage noted.  Malnutrition- down another pound unfortunately- need to continue to push for p.o. intake and protein intake-family is trying    Future Appointments  Date Time Provider [place]  03/04/2019  3:40 PM Marin Olp, MD [facility]  06/23/2019 11:15 AM Marzetta Board, DPM [facility]   Lab/Order associations: Foul smelling urine - Plan: POCT Urinalysis Dipstick (Automated), Urine Culture  Acute post-traumatic headache, not intractable - Plan: CT Head Wo Contrast  Confusion - Plan: POCT Urinalysis Dipstick (Automated), Urine Culture  Return precautions advised.  Once again advised ER trip but family declines-they are aware of my concern of brain bleed. Garret Reddish, MD

## 2019-02-11 NOTE — Patient Instructions (Addendum)
Video visit

## 2019-02-11 NOTE — Telephone Encounter (Signed)
See my video visit note from today

## 2019-02-12 ENCOUNTER — Other Ambulatory Visit (INDEPENDENT_AMBULATORY_CARE_PROVIDER_SITE_OTHER): Payer: Medicare Other

## 2019-02-12 DIAGNOSIS — R410 Disorientation, unspecified: Secondary | ICD-10-CM

## 2019-02-12 DIAGNOSIS — R8290 Unspecified abnormal findings in urine: Secondary | ICD-10-CM

## 2019-02-12 LAB — POC URINALSYSI DIPSTICK (AUTOMATED)
Bilirubin, UA: NEGATIVE
Blood, UA: NEGATIVE
Glucose, UA: POSITIVE — AB
Ketones, UA: NEGATIVE
Leukocytes, UA: NEGATIVE
Nitrite, UA: NEGATIVE
Protein, UA: POSITIVE — AB
Spec Grav, UA: 1.015 (ref 1.010–1.025)
Urobilinogen, UA: 1 E.U./dL
pH, UA: 7.5 (ref 5.0–8.0)

## 2019-02-13 LAB — URINE CULTURE
MICRO NUMBER:: 420186
SPECIMEN QUALITY:: ADEQUATE

## 2019-02-15 ENCOUNTER — Telehealth: Payer: Self-pay | Admitting: Family Medicine

## 2019-02-15 NOTE — Telephone Encounter (Signed)
Lab resulted by Isla Pence today to pt's husband.

## 2019-02-15 NOTE — Telephone Encounter (Signed)
See note  Copied from [place] [id number]. Topic: General - Other >> Feb 15, 2019 12:05 PM Leward Quan A wrote: Reason for CRM: Patient husband called back to get lab results. [name] left him a message on Friday asking for a call back please. Ph# [phone]

## 2019-02-19 ENCOUNTER — Telehealth: Payer: Self-pay | Admitting: Cardiology

## 2019-02-19 NOTE — Telephone Encounter (Signed)
Patients husband stated he did not want to make an appointment in the fall for an office visit or do a phone or video visit.

## 2019-03-04 ENCOUNTER — Encounter: Payer: Self-pay | Admitting: Family Medicine

## 2019-03-04 ENCOUNTER — Ambulatory Visit (INDEPENDENT_AMBULATORY_CARE_PROVIDER_SITE_OTHER): Payer: Medicare Other | Admitting: Family Medicine

## 2019-03-04 VITALS — BP 140/67 | HR 57 | Temp 98.0°F | Ht 66.0 in | Wt 118.0 lb

## 2019-03-04 DIAGNOSIS — R531 Weakness: Secondary | ICD-10-CM

## 2019-03-04 DIAGNOSIS — G301 Alzheimer's disease with late onset: Secondary | ICD-10-CM | POA: Diagnosis not present

## 2019-03-04 DIAGNOSIS — E119 Type 2 diabetes mellitus without complications: Secondary | ICD-10-CM

## 2019-03-04 DIAGNOSIS — I10 Essential (primary) hypertension: Secondary | ICD-10-CM | POA: Diagnosis not present

## 2019-03-04 DIAGNOSIS — E441 Mild protein-calorie malnutrition: Secondary | ICD-10-CM | POA: Diagnosis not present

## 2019-03-04 DIAGNOSIS — F0281 Dementia in other diseases classified elsewhere with behavioral disturbance: Secondary | ICD-10-CM | POA: Diagnosis not present

## 2019-03-04 DIAGNOSIS — E1159 Type 2 diabetes mellitus with other circulatory complications: Secondary | ICD-10-CM | POA: Diagnosis not present

## 2019-03-04 DIAGNOSIS — I152 Hypertension secondary to endocrine disorders: Secondary | ICD-10-CM

## 2019-03-04 NOTE — Patient Instructions (Addendum)
There are no preventive care reminders to display for this patient.  Depression screen [facility] 2/9 02/11/2019 09/03/2017 06/17/2017  Decreased Interest 0 0 0  Down, Depressed, Hopeless 0 0 0  PHQ - 2 Score 0 0 0  Some recent data might be hidden   Video visit

## 2019-03-04 NOTE — Progress Notes (Addendum)
Phone [phone]   Subjective:  Virtual visit via Video note. Chief complaint: Chief Complaint  Patient presents with  . Hypertension   This visit type was conducted due to national recommendations for restrictions regarding the COVID-19 Pandemic (e.g. social distancing).  This format is felt to be most appropriate for this patient at this time balancing risks to patient and risks to population by having him in for in person visit.  No physical exam was performed (except for noted visual exam or audio findings with Telehealth visits).    Our team/I connected with [name] at  3:40 PM EDT by a video enabled telemedicine application (doxy.me or caregility through epic) and verified that I am speaking with the correct person using two identifiers.  Location patient: Home-O2 Location provider: [facility], office Persons participating in the virtual visit:  Patient, husband, and daughter  Our team/I discussed the limitations of evaluation and management by telemedicine and the availability of in person appointments. In light of current covid-19 pandemic, patient also understands that we are trying to protect them by minimizing in office contact if at all possible.  The patient expressed consent for telemedicine visit and agreed to proceed. Patient understands insurance will be billed.   ROS- no fever/chills/chest pain/shortness of breath. Weakness with standing.    Past Medical History-  Patient Active Problem List   Diagnosis Date Noted  . History of DVT in adulthood 06/26/2017    Priority: High  . Coronary artery calcification seen on CAT scan 08/28/2016    Priority: High  . Alzheimer's disease ([place]) 01/04/2016    Priority: High  . Diabetes ([place]) 12/18/2006    Priority: High  . Atherosclerosis of native arteries of extremity with intermittent claudication ([place]) 12/18/2006    Priority: High  . Generalized weakness 05/02/2018    Priority: Medium  . Pancreas cyst 07/02/2015   Priority: Medium  . Asthma, chronic 03/16/2015    Priority: Medium  . Primary open angle glaucoma of left eye, moderate stage 12/25/2014    Priority: Medium  . Hypertension associated with diabetes ([place]) 12/18/2006    Priority: Medium  . GERD (gastroesophageal reflux disease) 05/02/2018    Priority: Low  . LBBB (left bundle branch block) 10/09/2016    Priority: Low  . Pleural effusion, left 09/23/2016    Priority: Low  . Pseudophakia of both eyes 02/15/2013    Priority: Low  . Anemia 05/25/2012    Priority: Low  . Astigmatism 04/29/2012    Priority: Low  . Allergic rhinitis 10/30/2010    Priority: Low  . Venous (peripheral) insufficiency 04/13/2007    Priority: Low  . OSTEOARTHRITIS OF SPINE, NOS 12/18/2006    Priority: Low  . Protein-calorie malnutrition ([place]) 07/21/2018  . Osteoporosis 07/02/2018  . Diabetic ulcer of foot with fat layer exposed ([place]) 05/14/2018    Medications- reviewed and updated Current Outpatient Medications  Medication Sig Dispense Refill  . albuterol (PROAIR HFA) 108 (90 Base) MCG/ACT inhaler 2 PUFFS EVERY 6 HOURS AS NEEDED FOR SHORTNESS OF BREATH. 17 g 5  . albuterol (PROVENTIL) (2.5 MG/3ML) 0.083% nebulizer solution USE THREE MILLILITERS VIA NEBULIZATION BY MOUTH EVERY 6 HOURS AS NEEDED FOR WHEEZING OR SHORTNESS OF BREATH 300 mL 1  . Ascorbic Acid (VITAMIN C) 1000 MG tablet Take 1,000 mg by mouth daily.    Marland Kitchen aspirin 81 MG tablet Take 81 mg by mouth daily.    Marland Kitchen azelastine (ASTELIN) 0.1 % nasal spray Place 1 spray into both nostrils 2 (two)  times daily. Use in each nostril as directed 30 mL 11  . brimonidine (ALPHAGAN P) 0.1 % SOLN INSTILL 1 DROP IN BOTH EYE TWICE DAILY.    . budesonide (PULMICORT) 0.25 MG/2ML nebulizer solution Take 2 mLs (0.25 mg total) by nebulization daily. ICD10 Code-J45.909 60 mL 12  . calcium carbonate (OS-CAL) 600 MG TABS Take 600 mg by mouth 2 (two) times daily with a meal.    . famotidine (PEPCID) 20 MG tablet Take 1 tablet  (20 mg total) by mouth 2 (two) times daily as needed for heartburn or indigestion. 60 tablet 5  . IRON PO Take 1 day or 1 dose by mouth.    . levothyroxine (SYNTHROID, LEVOTHROID) 25 MCG tablet Take by mouth.    . memantine (NAMENDA) 10 MG tablet TAKE 1 TABLET TWICE DAILY FOR MEMORY. 60 tablet 5  . Multiple Vitamin (MULTIVITAMIN WITH MINERALS) TABS tablet Take 1 tablet by mouth daily.    . mupirocin ointment (BACTROBAN) 2 % Apply 1 application topically 2 (two) times daily. Apply to the affected area 2 times a day 22 g 3  . nitroGLYCERIN (NITRODUR - DOSED IN MG/24 HR) 0.2 mg/hr patch Place 1 patch (0.2 mg total) onto the skin daily. 30 patch 12  . citalopram (CELEXA) 10 MG tablet TAKE 1 TABLET ONCE DAILY. (Patient not taking: Reported on 03/04/2019) 90 tablet 0  . donepezil (ARICEPT) 10 MG tablet Take 1 tablet (10 mg total) by mouth at bedtime. (Patient not taking: Reported on 03/04/2019) 90 tablet 3   No current facility-administered medications for this visit.      Objective:  BP 140/67   Pulse (!) 57   Temp 98 F (36.7 C)   Ht 5\' 6"  (1.676 m)   Wt 118 lb (53.5 kg)   LMP  (LMP Unknown)   BMI 19.05 kg/m  self reported vitals Gen: NAD, resting comfortably Lungs: nonlabored, normal respiratory rate  Skin: appears dry, no obvious rash Family helps answer questions- she is much more alert today     Assessment and Plan   #hypertension S: Mild poorly controlled today considering she has diabetes and CAD.  She is not currently on anything for blood pressure-when she tends to get dehydrated numbers can get really low BP Readings from Last 3 Encounters:  03/04/19 140/67  02/11/19 (!) 89/63  02/10/19 (!) 156/76  A/P: Mild poor control but I am concerned about having patient on blood pressure medication if she gets dehydrated because I think her blood pressure can drop significantly- would remain off medication for now and continue to monitor-if consistently over 150 I likely will change  this plan  #Generalized weakness S: Last PT over 2 motnhs ago. Stable- not getting stronger.  walking around breakfast bar about once a day but unable push forward to get stronger beyond that. Today, she didn't feel strong enough to stand to check her weight A/P: due to continued issues with generalized weakness, I thought patient would benefit from another round of home health PT- family and patient are agreeable.  #Alzheimer's disease/increased fatigue/dehydration-mild malnutrition S: Last visit patient was having significant weakness after a fall.  Fortunately head CT showed no subdural hematoma.  She has been very tired and not very alert during our conversations at that time.  Now when she is awake she seems more alert.  She still sleeps about 16 hours a day but is more interactive when she is up.  Family has tried to get patient to drink fluids-  this seems to be increased.  Patient really likes milk, boost, tea with orange juice in it A/P: Patient seems to be doing better overall-continue to push for adequate hydration-avoiding falls/ (ncreasing strength with PT likely to help) may also help keep her more alert.  Continue to push fluids and p.o. intake in regards to malnutrition-may need to recheck an A1c at follow-up due to diabetes-last A1c was okay at 6.6-would likely want to keep A1c under 8.5 at least given her mental state.  I want her to continue the boost for protein intake  We discussed 2-month follow-up-hopefully in person if possible  Future Appointments  Date Time Provider [place]  06/23/2019 11:15 AM Marzetta Board, DPM [facility]   Lab/Order associations: Hypertension associated with diabetes [facility])  Generalized weakness - Plan: Ambulatory referral to [place]  Late onset Alzheimer's disease with behavioral disturbance ([place])  Type 2 diabetes mellitus without complication, without long-term current use of insulin ([place])  Return precautions  advised.  Garret Reddish, MD

## 2019-03-06 NOTE — Assessment & Plan Note (Signed)
S: Mild poorly controlled today considering she has diabetes and CAD.  She is not currently on anything for blood pressure-when she tends to get dehydrated numbers can get really low BP Readings from Last 3 Encounters:  03/04/19 140/67  02/11/19 (!) 89/63  02/10/19 (!) 156/76  A/P: Mild poor control but I am concerned about having patient on blood pressure medication if she gets dehydrated because I think her blood pressure can drop significantly- would remain off medication for now and continue to monitor-if consistently over 150 I likely will change this plan

## 2019-03-09 ENCOUNTER — Telehealth: Payer: Self-pay | Admitting: Family Medicine

## 2019-03-09 DIAGNOSIS — I447 Left bundle-branch block, unspecified: Secondary | ICD-10-CM | POA: Diagnosis not present

## 2019-03-09 DIAGNOSIS — I2510 Atherosclerotic heart disease of native coronary artery without angina pectoris: Secondary | ICD-10-CM | POA: Diagnosis not present

## 2019-03-09 DIAGNOSIS — R1311 Dysphagia, oral phase: Secondary | ICD-10-CM | POA: Diagnosis not present

## 2019-03-09 DIAGNOSIS — Z7984 Long term (current) use of oral hypoglycemic drugs: Secondary | ICD-10-CM | POA: Diagnosis not present

## 2019-03-09 DIAGNOSIS — J45909 Unspecified asthma, uncomplicated: Secondary | ICD-10-CM | POA: Diagnosis not present

## 2019-03-09 DIAGNOSIS — F0280 Dementia in other diseases classified elsewhere without behavioral disturbance: Secondary | ICD-10-CM | POA: Diagnosis not present

## 2019-03-09 DIAGNOSIS — M479 Spondylosis, unspecified: Secondary | ICD-10-CM | POA: Diagnosis not present

## 2019-03-09 DIAGNOSIS — L89621 Pressure ulcer of left heel, stage 1: Secondary | ICD-10-CM | POA: Diagnosis not present

## 2019-03-09 DIAGNOSIS — M6281 Muscle weakness (generalized): Secondary | ICD-10-CM | POA: Diagnosis not present

## 2019-03-09 DIAGNOSIS — E1151 Type 2 diabetes mellitus with diabetic peripheral angiopathy without gangrene: Secondary | ICD-10-CM | POA: Diagnosis not present

## 2019-03-09 DIAGNOSIS — D649 Anemia, unspecified: Secondary | ICD-10-CM | POA: Diagnosis not present

## 2019-03-09 DIAGNOSIS — E46 Unspecified protein-calorie malnutrition: Secondary | ICD-10-CM | POA: Diagnosis not present

## 2019-03-09 DIAGNOSIS — L89891 Pressure ulcer of other site, stage 1: Secondary | ICD-10-CM | POA: Diagnosis not present

## 2019-03-09 DIAGNOSIS — L89611 Pressure ulcer of right heel, stage 1: Secondary | ICD-10-CM | POA: Diagnosis not present

## 2019-03-09 DIAGNOSIS — I10 Essential (primary) hypertension: Secondary | ICD-10-CM | POA: Diagnosis not present

## 2019-03-09 DIAGNOSIS — M810 Age-related osteoporosis without current pathological fracture: Secondary | ICD-10-CM | POA: Diagnosis not present

## 2019-03-09 DIAGNOSIS — G301 Alzheimer's disease with late onset: Secondary | ICD-10-CM | POA: Diagnosis not present

## 2019-03-09 NOTE — Telephone Encounter (Signed)
Okay for verbal orders? Please advise 

## 2019-03-09 NOTE — Telephone Encounter (Signed)
See note

## 2019-03-09 NOTE — Telephone Encounter (Signed)
Copied from [place] [id number]. Topic: Quick Communication - Home Health Verbal Orders >> Mar 09, 2019  4:02 PM Leward Quan A wrote: Caller/Agency: Constance Haw / [facility]  Callback Number: [phone] ok to LM Requesting OT/PT/Skilled Nursing/Social Work/Speech Therapy: PT, Home Health Frequency: Pt 2 wk 1, 1 wk 1 and 2 wk 2  Nursing evaluation due redness on left heel and right big toe

## 2019-03-09 NOTE — Telephone Encounter (Signed)
Yes thanks- ok for verbal orders 

## 2019-03-10 NOTE — Telephone Encounter (Signed)
Called [place] and provided verbal order.

## 2019-03-11 ENCOUNTER — Telehealth: Payer: Self-pay | Admitting: Family Medicine

## 2019-03-11 DIAGNOSIS — I10 Essential (primary) hypertension: Secondary | ICD-10-CM | POA: Diagnosis not present

## 2019-03-11 DIAGNOSIS — E1151 Type 2 diabetes mellitus with diabetic peripheral angiopathy without gangrene: Secondary | ICD-10-CM | POA: Diagnosis not present

## 2019-03-11 DIAGNOSIS — G301 Alzheimer's disease with late onset: Secondary | ICD-10-CM | POA: Diagnosis not present

## 2019-03-11 DIAGNOSIS — I2510 Atherosclerotic heart disease of native coronary artery without angina pectoris: Secondary | ICD-10-CM | POA: Diagnosis not present

## 2019-03-11 DIAGNOSIS — F0280 Dementia in other diseases classified elsewhere without behavioral disturbance: Secondary | ICD-10-CM | POA: Diagnosis not present

## 2019-03-11 DIAGNOSIS — E46 Unspecified protein-calorie malnutrition: Secondary | ICD-10-CM | POA: Diagnosis not present

## 2019-03-11 NOTE — Telephone Encounter (Signed)
See note

## 2019-03-11 NOTE — Telephone Encounter (Signed)
Okay for verbal orders? Please advise 

## 2019-03-11 NOTE — Telephone Encounter (Signed)
Copied from [place] [phone]. Topic: Quick Communication - Home Health Verbal Orders >> Mar 11, 2019  3:58 PM Mathis Bud wrote: Caller/Agency: Bevelyn Buckles Number: [phone] Requesting Skilled Nursing//Speech Therapy:  Frequency: 1x 4weeks

## 2019-03-11 NOTE — Telephone Encounter (Signed)
Yes thanks- ok for verbal orders 

## 2019-03-12 DIAGNOSIS — E1151 Type 2 diabetes mellitus with diabetic peripheral angiopathy without gangrene: Secondary | ICD-10-CM | POA: Diagnosis not present

## 2019-03-12 DIAGNOSIS — F0280 Dementia in other diseases classified elsewhere without behavioral disturbance: Secondary | ICD-10-CM | POA: Diagnosis not present

## 2019-03-12 DIAGNOSIS — I10 Essential (primary) hypertension: Secondary | ICD-10-CM | POA: Diagnosis not present

## 2019-03-12 DIAGNOSIS — G301 Alzheimer's disease with late onset: Secondary | ICD-10-CM | POA: Diagnosis not present

## 2019-03-12 DIAGNOSIS — E46 Unspecified protein-calorie malnutrition: Secondary | ICD-10-CM | POA: Diagnosis not present

## 2019-03-12 DIAGNOSIS — I2510 Atherosclerotic heart disease of native coronary artery without angina pectoris: Secondary | ICD-10-CM | POA: Diagnosis not present

## 2019-03-12 NOTE — Telephone Encounter (Signed)
Verbal orders given  

## 2019-03-16 ENCOUNTER — Telehealth: Payer: Self-pay | Admitting: Family Medicine

## 2019-03-16 DIAGNOSIS — I10 Essential (primary) hypertension: Secondary | ICD-10-CM | POA: Diagnosis not present

## 2019-03-16 DIAGNOSIS — I2510 Atherosclerotic heart disease of native coronary artery without angina pectoris: Secondary | ICD-10-CM | POA: Diagnosis not present

## 2019-03-16 DIAGNOSIS — E1151 Type 2 diabetes mellitus with diabetic peripheral angiopathy without gangrene: Secondary | ICD-10-CM | POA: Diagnosis not present

## 2019-03-16 DIAGNOSIS — E46 Unspecified protein-calorie malnutrition: Secondary | ICD-10-CM | POA: Diagnosis not present

## 2019-03-16 DIAGNOSIS — F0280 Dementia in other diseases classified elsewhere without behavioral disturbance: Secondary | ICD-10-CM | POA: Diagnosis not present

## 2019-03-16 DIAGNOSIS — G301 Alzheimer's disease with late onset: Secondary | ICD-10-CM | POA: Diagnosis not present

## 2019-03-16 NOTE — Telephone Encounter (Signed)
Returned Erica's call and left verbal order for skilled nursing for open wound on pt's R upper buttock.  Asked that she link this order to prior malnutrition dx and that she return call to let us know that order has been placed.

## 2019-03-16 NOTE — Telephone Encounter (Signed)
See note

## 2019-03-16 NOTE — Telephone Encounter (Unsigned)
Copied from [place] [phone]. Topic: Quick Communication - Home Health Verbal Orders >> Mar 16, 2019  1:19 PM Yvette Rack wrote: Caller/Agency: Danae Chen with [facility]  Callback Number: [phone]  secure voicemail Requesting OT/PT/Skilled Nursing/Social Work/Speech Therapy: skilled nursing Frequency: additional visits PRN

## 2019-03-16 NOTE — Telephone Encounter (Signed)
Disregard previous message. Waiting for call back from agency to make sure they have received Molly's vm.

## 2019-03-16 NOTE — Telephone Encounter (Signed)
May provide verbal. May require face to face. See if they can link evaluation to malnutrition since we did discuss that last visit

## 2019-03-16 NOTE — Telephone Encounter (Signed)
Okay for verbal orders? Please advise 

## 2019-03-17 NOTE — Telephone Encounter (Signed)
Called and spoke to [place] who initially called regarding order for Skilled Nursing for pt and she verified that she received the verbal order and that everything has been taken care of as far as she is aware.

## 2019-03-18 DIAGNOSIS — I10 Essential (primary) hypertension: Secondary | ICD-10-CM | POA: Diagnosis not present

## 2019-03-18 DIAGNOSIS — G301 Alzheimer's disease with late onset: Secondary | ICD-10-CM | POA: Diagnosis not present

## 2019-03-18 DIAGNOSIS — E1151 Type 2 diabetes mellitus with diabetic peripheral angiopathy without gangrene: Secondary | ICD-10-CM | POA: Diagnosis not present

## 2019-03-18 DIAGNOSIS — E46 Unspecified protein-calorie malnutrition: Secondary | ICD-10-CM | POA: Diagnosis not present

## 2019-03-18 DIAGNOSIS — I2510 Atherosclerotic heart disease of native coronary artery without angina pectoris: Secondary | ICD-10-CM | POA: Diagnosis not present

## 2019-03-18 DIAGNOSIS — F0280 Dementia in other diseases classified elsewhere without behavioral disturbance: Secondary | ICD-10-CM | POA: Diagnosis not present

## 2019-03-22 DIAGNOSIS — I10 Essential (primary) hypertension: Secondary | ICD-10-CM | POA: Diagnosis not present

## 2019-03-22 DIAGNOSIS — F0280 Dementia in other diseases classified elsewhere without behavioral disturbance: Secondary | ICD-10-CM | POA: Diagnosis not present

## 2019-03-22 DIAGNOSIS — E46 Unspecified protein-calorie malnutrition: Secondary | ICD-10-CM | POA: Diagnosis not present

## 2019-03-22 DIAGNOSIS — I2510 Atherosclerotic heart disease of native coronary artery without angina pectoris: Secondary | ICD-10-CM | POA: Diagnosis not present

## 2019-03-22 DIAGNOSIS — G301 Alzheimer's disease with late onset: Secondary | ICD-10-CM | POA: Diagnosis not present

## 2019-03-22 DIAGNOSIS — E1151 Type 2 diabetes mellitus with diabetic peripheral angiopathy without gangrene: Secondary | ICD-10-CM | POA: Diagnosis not present

## 2019-03-23 DIAGNOSIS — M479 Spondylosis, unspecified: Secondary | ICD-10-CM

## 2019-03-23 DIAGNOSIS — F0280 Dementia in other diseases classified elsewhere without behavioral disturbance: Secondary | ICD-10-CM | POA: Diagnosis not present

## 2019-03-23 DIAGNOSIS — E46 Unspecified protein-calorie malnutrition: Secondary | ICD-10-CM | POA: Diagnosis not present

## 2019-03-23 DIAGNOSIS — M6281 Muscle weakness (generalized): Secondary | ICD-10-CM | POA: Diagnosis not present

## 2019-03-23 DIAGNOSIS — I447 Left bundle-branch block, unspecified: Secondary | ICD-10-CM

## 2019-03-23 DIAGNOSIS — I10 Essential (primary) hypertension: Secondary | ICD-10-CM | POA: Diagnosis not present

## 2019-03-23 DIAGNOSIS — L89891 Pressure ulcer of other site, stage 1: Secondary | ICD-10-CM

## 2019-03-23 DIAGNOSIS — L89621 Pressure ulcer of left heel, stage 1: Secondary | ICD-10-CM

## 2019-03-23 DIAGNOSIS — J45909 Unspecified asthma, uncomplicated: Secondary | ICD-10-CM

## 2019-03-23 DIAGNOSIS — D649 Anemia, unspecified: Secondary | ICD-10-CM

## 2019-03-23 DIAGNOSIS — Z7984 Long term (current) use of oral hypoglycemic drugs: Secondary | ICD-10-CM

## 2019-03-23 DIAGNOSIS — M810 Age-related osteoporosis without current pathological fracture: Secondary | ICD-10-CM

## 2019-03-23 DIAGNOSIS — R1311 Dysphagia, oral phase: Secondary | ICD-10-CM

## 2019-03-23 DIAGNOSIS — L89611 Pressure ulcer of right heel, stage 1: Secondary | ICD-10-CM

## 2019-03-23 DIAGNOSIS — I2510 Atherosclerotic heart disease of native coronary artery without angina pectoris: Secondary | ICD-10-CM | POA: Diagnosis not present

## 2019-03-23 DIAGNOSIS — E1151 Type 2 diabetes mellitus with diabetic peripheral angiopathy without gangrene: Secondary | ICD-10-CM | POA: Diagnosis not present

## 2019-03-23 DIAGNOSIS — G301 Alzheimer's disease with late onset: Secondary | ICD-10-CM | POA: Diagnosis not present

## 2019-03-24 DIAGNOSIS — G301 Alzheimer's disease with late onset: Secondary | ICD-10-CM | POA: Diagnosis not present

## 2019-03-24 DIAGNOSIS — I10 Essential (primary) hypertension: Secondary | ICD-10-CM | POA: Diagnosis not present

## 2019-03-24 DIAGNOSIS — E46 Unspecified protein-calorie malnutrition: Secondary | ICD-10-CM | POA: Diagnosis not present

## 2019-03-24 DIAGNOSIS — I2510 Atherosclerotic heart disease of native coronary artery without angina pectoris: Secondary | ICD-10-CM | POA: Diagnosis not present

## 2019-03-24 DIAGNOSIS — E1151 Type 2 diabetes mellitus with diabetic peripheral angiopathy without gangrene: Secondary | ICD-10-CM | POA: Diagnosis not present

## 2019-03-24 DIAGNOSIS — F0280 Dementia in other diseases classified elsewhere without behavioral disturbance: Secondary | ICD-10-CM | POA: Diagnosis not present

## 2019-03-25 DIAGNOSIS — E46 Unspecified protein-calorie malnutrition: Secondary | ICD-10-CM | POA: Diagnosis not present

## 2019-03-25 DIAGNOSIS — E1151 Type 2 diabetes mellitus with diabetic peripheral angiopathy without gangrene: Secondary | ICD-10-CM | POA: Diagnosis not present

## 2019-03-25 DIAGNOSIS — G301 Alzheimer's disease with late onset: Secondary | ICD-10-CM | POA: Diagnosis not present

## 2019-03-25 DIAGNOSIS — I10 Essential (primary) hypertension: Secondary | ICD-10-CM | POA: Diagnosis not present

## 2019-03-25 DIAGNOSIS — I2510 Atherosclerotic heart disease of native coronary artery without angina pectoris: Secondary | ICD-10-CM | POA: Diagnosis not present

## 2019-03-25 DIAGNOSIS — F0280 Dementia in other diseases classified elsewhere without behavioral disturbance: Secondary | ICD-10-CM | POA: Diagnosis not present

## 2019-03-29 DIAGNOSIS — G301 Alzheimer's disease with late onset: Secondary | ICD-10-CM | POA: Diagnosis not present

## 2019-03-29 DIAGNOSIS — I10 Essential (primary) hypertension: Secondary | ICD-10-CM | POA: Diagnosis not present

## 2019-03-29 DIAGNOSIS — I2510 Atherosclerotic heart disease of native coronary artery without angina pectoris: Secondary | ICD-10-CM | POA: Diagnosis not present

## 2019-03-29 DIAGNOSIS — E1151 Type 2 diabetes mellitus with diabetic peripheral angiopathy without gangrene: Secondary | ICD-10-CM | POA: Diagnosis not present

## 2019-03-29 DIAGNOSIS — F0280 Dementia in other diseases classified elsewhere without behavioral disturbance: Secondary | ICD-10-CM | POA: Diagnosis not present

## 2019-03-29 DIAGNOSIS — E46 Unspecified protein-calorie malnutrition: Secondary | ICD-10-CM | POA: Diagnosis not present

## 2019-03-31 DIAGNOSIS — G301 Alzheimer's disease with late onset: Secondary | ICD-10-CM | POA: Diagnosis not present

## 2019-03-31 DIAGNOSIS — F0280 Dementia in other diseases classified elsewhere without behavioral disturbance: Secondary | ICD-10-CM | POA: Diagnosis not present

## 2019-03-31 DIAGNOSIS — E46 Unspecified protein-calorie malnutrition: Secondary | ICD-10-CM | POA: Diagnosis not present

## 2019-03-31 DIAGNOSIS — E1151 Type 2 diabetes mellitus with diabetic peripheral angiopathy without gangrene: Secondary | ICD-10-CM | POA: Diagnosis not present

## 2019-03-31 DIAGNOSIS — I10 Essential (primary) hypertension: Secondary | ICD-10-CM | POA: Diagnosis not present

## 2019-03-31 DIAGNOSIS — I2510 Atherosclerotic heart disease of native coronary artery without angina pectoris: Secondary | ICD-10-CM | POA: Diagnosis not present

## 2019-04-01 DIAGNOSIS — I10 Essential (primary) hypertension: Secondary | ICD-10-CM | POA: Diagnosis not present

## 2019-04-01 DIAGNOSIS — F0280 Dementia in other diseases classified elsewhere without behavioral disturbance: Secondary | ICD-10-CM | POA: Diagnosis not present

## 2019-04-01 DIAGNOSIS — G301 Alzheimer's disease with late onset: Secondary | ICD-10-CM | POA: Diagnosis not present

## 2019-04-01 DIAGNOSIS — E1151 Type 2 diabetes mellitus with diabetic peripheral angiopathy without gangrene: Secondary | ICD-10-CM | POA: Diagnosis not present

## 2019-04-01 DIAGNOSIS — I2510 Atherosclerotic heart disease of native coronary artery without angina pectoris: Secondary | ICD-10-CM | POA: Diagnosis not present

## 2019-04-01 DIAGNOSIS — E46 Unspecified protein-calorie malnutrition: Secondary | ICD-10-CM | POA: Diagnosis not present

## 2019-04-06 DIAGNOSIS — F0280 Dementia in other diseases classified elsewhere without behavioral disturbance: Secondary | ICD-10-CM | POA: Diagnosis not present

## 2019-04-06 DIAGNOSIS — G301 Alzheimer's disease with late onset: Secondary | ICD-10-CM | POA: Diagnosis not present

## 2019-04-06 DIAGNOSIS — E1151 Type 2 diabetes mellitus with diabetic peripheral angiopathy without gangrene: Secondary | ICD-10-CM | POA: Diagnosis not present

## 2019-04-06 DIAGNOSIS — I2510 Atherosclerotic heart disease of native coronary artery without angina pectoris: Secondary | ICD-10-CM | POA: Diagnosis not present

## 2019-04-06 DIAGNOSIS — E46 Unspecified protein-calorie malnutrition: Secondary | ICD-10-CM | POA: Diagnosis not present

## 2019-04-06 DIAGNOSIS — I10 Essential (primary) hypertension: Secondary | ICD-10-CM | POA: Diagnosis not present

## 2019-04-08 DIAGNOSIS — Z7984 Long term (current) use of oral hypoglycemic drugs: Secondary | ICD-10-CM | POA: Diagnosis not present

## 2019-04-08 DIAGNOSIS — I10 Essential (primary) hypertension: Secondary | ICD-10-CM | POA: Diagnosis not present

## 2019-04-08 DIAGNOSIS — E46 Unspecified protein-calorie malnutrition: Secondary | ICD-10-CM | POA: Diagnosis not present

## 2019-04-08 DIAGNOSIS — I447 Left bundle-branch block, unspecified: Secondary | ICD-10-CM | POA: Diagnosis not present

## 2019-04-08 DIAGNOSIS — I2510 Atherosclerotic heart disease of native coronary artery without angina pectoris: Secondary | ICD-10-CM | POA: Diagnosis not present

## 2019-04-08 DIAGNOSIS — F0280 Dementia in other diseases classified elsewhere without behavioral disturbance: Secondary | ICD-10-CM | POA: Diagnosis not present

## 2019-04-08 DIAGNOSIS — D649 Anemia, unspecified: Secondary | ICD-10-CM | POA: Diagnosis not present

## 2019-04-08 DIAGNOSIS — M6281 Muscle weakness (generalized): Secondary | ICD-10-CM | POA: Diagnosis not present

## 2019-04-08 DIAGNOSIS — E1151 Type 2 diabetes mellitus with diabetic peripheral angiopathy without gangrene: Secondary | ICD-10-CM | POA: Diagnosis not present

## 2019-04-08 DIAGNOSIS — M810 Age-related osteoporosis without current pathological fracture: Secondary | ICD-10-CM | POA: Diagnosis not present

## 2019-04-08 DIAGNOSIS — L89621 Pressure ulcer of left heel, stage 1: Secondary | ICD-10-CM | POA: Diagnosis not present

## 2019-04-08 DIAGNOSIS — L89611 Pressure ulcer of right heel, stage 1: Secondary | ICD-10-CM | POA: Diagnosis not present

## 2019-04-08 DIAGNOSIS — M479 Spondylosis, unspecified: Secondary | ICD-10-CM | POA: Diagnosis not present

## 2019-04-08 DIAGNOSIS — R1311 Dysphagia, oral phase: Secondary | ICD-10-CM | POA: Diagnosis not present

## 2019-04-08 DIAGNOSIS — L89891 Pressure ulcer of other site, stage 1: Secondary | ICD-10-CM | POA: Diagnosis not present

## 2019-04-08 DIAGNOSIS — J45909 Unspecified asthma, uncomplicated: Secondary | ICD-10-CM | POA: Diagnosis not present

## 2019-04-08 DIAGNOSIS — G301 Alzheimer's disease with late onset: Secondary | ICD-10-CM | POA: Diagnosis not present

## 2019-04-09 DIAGNOSIS — G301 Alzheimer's disease with late onset: Secondary | ICD-10-CM | POA: Diagnosis not present

## 2019-04-09 DIAGNOSIS — I10 Essential (primary) hypertension: Secondary | ICD-10-CM | POA: Diagnosis not present

## 2019-04-09 DIAGNOSIS — F0280 Dementia in other diseases classified elsewhere without behavioral disturbance: Secondary | ICD-10-CM | POA: Diagnosis not present

## 2019-04-09 DIAGNOSIS — E1151 Type 2 diabetes mellitus with diabetic peripheral angiopathy without gangrene: Secondary | ICD-10-CM | POA: Diagnosis not present

## 2019-04-09 DIAGNOSIS — E46 Unspecified protein-calorie malnutrition: Secondary | ICD-10-CM | POA: Diagnosis not present

## 2019-04-09 DIAGNOSIS — I2510 Atherosclerotic heart disease of native coronary artery without angina pectoris: Secondary | ICD-10-CM | POA: Diagnosis not present

## 2019-04-12 DIAGNOSIS — I10 Essential (primary) hypertension: Secondary | ICD-10-CM | POA: Diagnosis not present

## 2019-04-12 DIAGNOSIS — E46 Unspecified protein-calorie malnutrition: Secondary | ICD-10-CM | POA: Diagnosis not present

## 2019-04-12 DIAGNOSIS — F0280 Dementia in other diseases classified elsewhere without behavioral disturbance: Secondary | ICD-10-CM | POA: Diagnosis not present

## 2019-04-12 DIAGNOSIS — G301 Alzheimer's disease with late onset: Secondary | ICD-10-CM | POA: Diagnosis not present

## 2019-04-12 DIAGNOSIS — E1151 Type 2 diabetes mellitus with diabetic peripheral angiopathy without gangrene: Secondary | ICD-10-CM | POA: Diagnosis not present

## 2019-04-12 DIAGNOSIS — I2510 Atherosclerotic heart disease of native coronary artery without angina pectoris: Secondary | ICD-10-CM | POA: Diagnosis not present

## 2019-04-16 DIAGNOSIS — I2510 Atherosclerotic heart disease of native coronary artery without angina pectoris: Secondary | ICD-10-CM | POA: Diagnosis not present

## 2019-04-16 DIAGNOSIS — E1151 Type 2 diabetes mellitus with diabetic peripheral angiopathy without gangrene: Secondary | ICD-10-CM | POA: Diagnosis not present

## 2019-04-16 DIAGNOSIS — G301 Alzheimer's disease with late onset: Secondary | ICD-10-CM | POA: Diagnosis not present

## 2019-04-16 DIAGNOSIS — F0280 Dementia in other diseases classified elsewhere without behavioral disturbance: Secondary | ICD-10-CM | POA: Diagnosis not present

## 2019-04-16 DIAGNOSIS — E46 Unspecified protein-calorie malnutrition: Secondary | ICD-10-CM | POA: Diagnosis not present

## 2019-04-16 DIAGNOSIS — I10 Essential (primary) hypertension: Secondary | ICD-10-CM | POA: Diagnosis not present

## 2019-04-21 DIAGNOSIS — I2510 Atherosclerotic heart disease of native coronary artery without angina pectoris: Secondary | ICD-10-CM | POA: Diagnosis not present

## 2019-04-21 DIAGNOSIS — I10 Essential (primary) hypertension: Secondary | ICD-10-CM | POA: Diagnosis not present

## 2019-04-21 DIAGNOSIS — E1151 Type 2 diabetes mellitus with diabetic peripheral angiopathy without gangrene: Secondary | ICD-10-CM | POA: Diagnosis not present

## 2019-04-21 DIAGNOSIS — E46 Unspecified protein-calorie malnutrition: Secondary | ICD-10-CM | POA: Diagnosis not present

## 2019-04-21 DIAGNOSIS — F0280 Dementia in other diseases classified elsewhere without behavioral disturbance: Secondary | ICD-10-CM | POA: Diagnosis not present

## 2019-04-21 DIAGNOSIS — G301 Alzheimer's disease with late onset: Secondary | ICD-10-CM | POA: Diagnosis not present

## 2019-04-30 DIAGNOSIS — I10 Essential (primary) hypertension: Secondary | ICD-10-CM | POA: Diagnosis not present

## 2019-04-30 DIAGNOSIS — E1151 Type 2 diabetes mellitus with diabetic peripheral angiopathy without gangrene: Secondary | ICD-10-CM | POA: Diagnosis not present

## 2019-04-30 DIAGNOSIS — F0280 Dementia in other diseases classified elsewhere without behavioral disturbance: Secondary | ICD-10-CM | POA: Diagnosis not present

## 2019-04-30 DIAGNOSIS — I2510 Atherosclerotic heart disease of native coronary artery without angina pectoris: Secondary | ICD-10-CM | POA: Diagnosis not present

## 2019-04-30 DIAGNOSIS — E46 Unspecified protein-calorie malnutrition: Secondary | ICD-10-CM | POA: Diagnosis not present

## 2019-04-30 DIAGNOSIS — G301 Alzheimer's disease with late onset: Secondary | ICD-10-CM | POA: Diagnosis not present

## 2019-05-04 ENCOUNTER — Other Ambulatory Visit: Payer: Self-pay | Admitting: Family Medicine

## 2019-05-06 ENCOUNTER — Ambulatory Visit: Payer: Self-pay

## 2019-05-06 DIAGNOSIS — E1151 Type 2 diabetes mellitus with diabetic peripheral angiopathy without gangrene: Secondary | ICD-10-CM | POA: Diagnosis not present

## 2019-05-06 DIAGNOSIS — I10 Essential (primary) hypertension: Secondary | ICD-10-CM | POA: Diagnosis not present

## 2019-05-06 DIAGNOSIS — I2510 Atherosclerotic heart disease of native coronary artery without angina pectoris: Secondary | ICD-10-CM | POA: Diagnosis not present

## 2019-05-06 DIAGNOSIS — F0280 Dementia in other diseases classified elsewhere without behavioral disturbance: Secondary | ICD-10-CM | POA: Diagnosis not present

## 2019-05-06 DIAGNOSIS — G301 Alzheimer's disease with late onset: Secondary | ICD-10-CM | POA: Diagnosis not present

## 2019-05-06 DIAGNOSIS — E46 Unspecified protein-calorie malnutrition: Secondary | ICD-10-CM | POA: Diagnosis not present

## 2019-05-06 NOTE — Telephone Encounter (Signed)
Call received from [facility] nurse who states that last week [name] had a red area superior aspect of her right great toe.  Today when she visited she noted that the area was open and red with sloth in the center of the wound.  Pt is minimally ambulatory.The redness has spread to the foot along the toes. [name] does not complain of pain.  She has no fever.  Nurse Mims will contact patient husband with instruction to seek care at urgent care. Nurse Mims has cleansed the wound and applied protection. Note routed to office for follow up in AM.  Reason for Disposition . [1] Skin around the wound has become red AND [2] larger than 2 inches (5 cm)  Answer Assessment - Initial Assessment Questions 1. MECHANISM: "How did the injury happen?"      Pressure area to Superior aspect right great toe 2. ONSET: "When did the injury happen?" (Minutes or hours ago)      today 3. LOCATION: "What part of the toe is injured?" "Is the nail damaged?"      Rt great toe 4. APPEARANCE of TOE INJURY: "What does the injury look like?"      pressure 5. SEVERITY: "Can you use the foot normally?" "Can you walk?"      Minimal ambulation is normal for her 6. SIZE: For cuts, bruises, or swelling, ask: "How large is it?" (e.g., inches or centimeters;  entire toe)     2x2 area to rt great toe 7. PAIN: "Is there pain?" If so, ask: "How bad is the pain?"   (e.g., Scale 1-10; or mild, moderate, severe)     Unsure no pain 8. TETANUS: For any breaks in the skin, ask: "When was the last tetanus booster?"     unsure 9. DIABETES: "Do you have a history of diabetes or poor circulation in the feet?"     Yes, limited mobility 10. OTHER SYMPTOMS: "Do you have any other symptoms?"        No fever 11. PREGNANCY: "Is there any chance you are pregnant?" "When was your last menstrual period?"       N/A  Answer Assessment - Initial Assessment Questions 1. LOCATION: "Where is the wound located?"      Rt great toe 2. WOUND APPEARANCE:  "What does the wound look like?"      Red swollen 3. SIZE: If redness is present, ask: "What is the size of the red area?" (Inches, centimeters, or compare to size of a coin)      2x2 area 4. SPREAD: "What's changed in the last day?"  "Do you see any red streaks coming from the wound?"    Red expanding from wound 5. ONSET: "When did it start to look infected?"     today 6. MECHANISM: "How did the wound start, what was the cause?"     Pressure area last week when evauated 7. PAIN: "Is there any pain?" If so, ask: "How bad is the pain?"   (Scale 1-10; or mild, moderate, severe)     denies 8. FEVER: "Do you have a fever?" If so, ask: "What is your temperature, how was it measured, and when did it start?"     No 9. OTHER SYMPTOMS: "Do you have any other symptoms?" (e.g., shaking chills, weakness, rash elsewhere on body)   No 10. PREGNANCY: "Is there any chance you are pregnant?" "When was your last menstrual period?"       N/A  Protocols used:  WOUND INFECTION-A-AH, TOE INJURY-A-AH

## 2019-05-07 ENCOUNTER — Encounter: Payer: Self-pay | Admitting: Internal Medicine

## 2019-05-07 ENCOUNTER — Ambulatory Visit (INDEPENDENT_AMBULATORY_CARE_PROVIDER_SITE_OTHER): Payer: Medicare Other | Admitting: Internal Medicine

## 2019-05-07 VITALS — BP 127/64 | Ht 66.0 in | Wt 112.0 lb

## 2019-05-07 DIAGNOSIS — E119 Type 2 diabetes mellitus without complications: Secondary | ICD-10-CM

## 2019-05-07 DIAGNOSIS — F0281 Dementia in other diseases classified elsewhere with behavioral disturbance: Secondary | ICD-10-CM | POA: Diagnosis not present

## 2019-05-07 DIAGNOSIS — Z0289 Encounter for other administrative examinations: Secondary | ICD-10-CM

## 2019-05-07 DIAGNOSIS — G301 Alzheimer's disease with late onset: Secondary | ICD-10-CM | POA: Diagnosis not present

## 2019-05-07 DIAGNOSIS — L989 Disorder of the skin and subcutaneous tissue, unspecified: Secondary | ICD-10-CM

## 2019-05-07 MED ORDER — AMOXICILLIN-POT CLAVULANATE 400-57 MG/5ML PO SUSR: 600.0000 mg | Freq: Two times a day (BID) | ORAL | 1 refills | Status: DC

## 2019-05-07 NOTE — Patient Instructions (Signed)
Health Maintenance Due  Topic Date Due  . HEMOGLOBIN A1C  04/02/2019    Depression screen [facility] 2/9 02/11/2019 09/03/2017 06/17/2017  Decreased Interest 0 0 0  Down, Depressed, Hopeless 0 0 0  PHQ - 2 Score 0 0 0  Some recent data might be hidden

## 2019-05-07 NOTE — Progress Notes (Signed)
Virtual Visit via Video Note  I connected with@ on 05/07/19 at  2:00 PM EDT by a video enabled telemedicine application and verified that I am speaking with the correct person using two identifiers. Location patient: home Location provider:work  office Persons participating in the virtual visit: patient, provider daughter and husband   [facility] [place] national recommendations  regarding COVID 19 pandemic   video visit is advised over in office visit for this patient.  Patient aware  of the limitations of evaluation and management by telemedicine and  availability of in person appointments. and agreed to proceed.   HPI: [name] presents for video visit  SDA PCP NA  Has had toe l sore for a few weeks and over the last days worsening so that [facility] nurse advise seeking MD care .  Has dementia sleeps a lot   No known trauma no fever   Uses walker at home  With socks   They had been using  Medi honey   Diabetes in control   Has seen  Dr Sharol Given  march for pressure ulcer  ulcer in her left toe and diabeteic ulcers  pressure  Left heel    They have healed up with antibiotic and local care topicals  No fever no antibiotic  Allergy  ROS: See pertinent positives and negatives per HPI. No fever chills  Other sores   Past Medical History:  Diagnosis Date  . Alzheimer's dementia without behavioral disturbance ([place])    diagnosed 12/2015; Dr. Jade/neurology  . Anemia   . Asthma   . Blood transfusion 05-24-12  . Blood transfusion without reported diagnosis   . Chronic kidney disease    dr Clover Mealy  . Diabetes mellitus   . Diverticulosis   . DVT (deep venous thrombosis) ([place]) 03/2005   hx.left leg  . Esophageal stricture   . GERD (gastroesophageal reflux disease)   . Glaucoma   . Hiatal hernia 05/07/13  . Hyperlipidemia   . Hypertension   . Osteoarthritis   . Pancreatic duct dilated 05/07/13  . Pulmonary nodules 05/07/13  . PVD (peripheral vascular disease) ([place])   . Tubular adenoma of colon      Past Surgical History:  Procedure Laterality Date  . CATARACT EXTRACTION, BILATERAL    . COLONOSCOPY  05/27/2012   Procedure: COLONOSCOPY;  Surgeon: Lafayette Dragon, MD;  Location: [facility];  Service: Endoscopy;  Laterality: N/A;  . ESOPHAGOGASTRODUODENOSCOPY  05/26/2012   Procedure: ESOPHAGOGASTRODUODENOSCOPY (EGD);  Surgeon: Lafayette Dragon, MD;  Location: [facility];  Service: Endoscopy;  Laterality: N/A;  . EUS N/A 07/08/2013   Procedure: UPPER ENDOSCOPIC ULTRASOUND (EUS) LINEAR;  Surgeon: Milus Banister, MD;  Location: [facility];  Service: Endoscopy;  Laterality: N/A;  need side view scope  . EYE SURGERY    . NM MYOVIEW LTD  10/2016   Read as intermediate risk due to EF of 38%.Anemia or infarction noted. Only abnormal septal WM.   [facility]land Kitchen TRANSTHORACIC ECHOCARDIOGRAM  10/2016    EF 45-50% with possible inferior hypokinesis. Aortic sclerosis without stenosis (ordered to reassess EF from Myoview)  . VASCULAR SURGERY  09/2004  . WRIST FRACTURE SURGERY     R wrist fracture, plate    Family History  Adopted: Yes  Problem Relation Age of Onset  . Migraines Daughter   . Cancer Son   . Colon cancer Neg Hx     Social History   Tobacco Use  . Smoking status: Never Smoker  . Smokeless tobacco: Never Used  Substance Use Topics  . Alcohol use: No  . Drug use: No      Current Outpatient Medications:  .  albuterol (PROAIR HFA) 108 (90 Base) MCG/ACT inhaler, 2 PUFFS EVERY 6 HOURS AS NEEDED FOR SHORTNESS OF BREATH., Disp: 17 g, Rfl: 5 .  albuterol (PROVENTIL) (2.5 MG/3ML) 0.083% nebulizer solution, USE THREE MILLILITERS VIA NEBULIZATION BY MOUTH EVERY 6 HOURS AS NEEDED FOR WHEEZING OR SHORTNESS OF BREATH, Disp: 300 mL, Rfl: 1 .  Ascorbic Acid (VITAMIN C) 1000 MG tablet, Take 1,000 mg by mouth daily., Disp: , Rfl:  .  aspirin 81 MG tablet, Take 81 mg by mouth daily., Disp: , Rfl:  .  azelastine (ASTELIN) 0.1 % nasal spray, Place 1 spray into both nostrils 2 (two) times daily. Use in  each nostril as directed, Disp: 30 mL, Rfl: 11 .  brimonidine (ALPHAGAN P) 0.1 % SOLN, INSTILL 1 DROP IN BOTH EYE TWICE DAILY., Disp: , Rfl:  .  budesonide (PULMICORT) 0.25 MG/2ML nebulizer solution, Take 2 mLs (0.25 mg total) by nebulization daily. ICD10 Code-J45.909, Disp: 60 mL, Rfl: 12 .  calcium carbonate (OS-CAL) 600 MG TABS, Take 600 mg by mouth 2 (two) times daily with a meal., Disp: , Rfl:  .  citalopram (CELEXA) 10 MG tablet, TAKE 1 TABLET ONCE DAILY., Disp: 90 tablet, Rfl: 0 .  donepezil (ARICEPT) 10 MG tablet, Take 1 tablet (10 mg total) by mouth at bedtime., Disp: 90 tablet, Rfl: 3 .  famotidine (PEPCID) 20 MG tablet, Take 1 tablet (20 mg total) by mouth 2 (two) times daily as needed for heartburn or indigestion., Disp: 60 tablet, Rfl: 5 .  IRON PO, Take 1 day or 1 dose by mouth., Disp: , Rfl:  .  levothyroxine (SYNTHROID, LEVOTHROID) 25 MCG tablet, Take by mouth., Disp: , Rfl:  .  memantine (NAMENDA) 10 MG tablet, TAKE 1 TABLET TWICE DAILY FOR MEMORY., Disp: 60 tablet, Rfl: 5 .  Multiple Vitamin (MULTIVITAMIN WITH MINERALS) TABS tablet, Take 1 tablet by mouth daily., Disp: , Rfl:  .  mupirocin ointment (BACTROBAN) 2 %, Apply 1 application topically 2 (two) times daily. Apply to the affected area 2 times a day, Disp: 22 g, Rfl: 3 .  nitroGLYCERIN (NITRODUR - DOSED IN MG/24 HR) 0.2 mg/hr patch, Place 1 patch (0.2 mg total) onto the skin daily., Disp: 30 patch, Rfl: 12 .  amoxicillin-clavulanate (AUGMENTIN) 400-57 MG/5ML suspension, Take 7.5 mLs (600 mg total) by mouth 2 (two) times daily., Disp: 100 mL, Rfl: 1  EXAM: BP Readings from Last 3 Encounters:  05/07/19 127/64  03/04/19 140/67  02/11/19 (!) 89/63    VITALS per patient if applicable:  GENERAL:sleeping laying down in nad  Feet exposed LUNGS: no signs of respiratory distress, breathing rate appears normal, no obvious gross SOB, gasping or wheezing CV: no obvious cyanosis  MS:   Feet   Tip of right great toe with  Ulcer  with red base top of to flattened and surrounding swelling and mild edema  No streaking   No pus seen   No ob ulcers color change rest of bothe feet and no edema   Lab Results  Component Value Date   WBC 8.5 05/01/2018   HGB 13.3 05/01/2018   HCT 39.8 05/01/2018   PLT 367.0 05/01/2018   GLUCOSE 161 (H) 10/01/2018   CHOL 184 05/01/2018   TRIG 148.0 05/01/2018   HDL 48.70 05/01/2018   LDLCALC 106 (H) 05/01/2018   ALT 10 10/01/2018  AST 18 10/01/2018   NA 138 10/01/2018   K 4.6 10/01/2018   CL 100 10/01/2018   CREATININE 0.83 10/01/2018   BUN 19 10/01/2018   CO2 30 10/01/2018   TSH 3.89 05/01/2018   INR 1.03 05/25/2012   HGBA1C 6.6 (H) 10/01/2018   MICROALBUR 1.3 09/23/2018    ASSESSMENT AND PLAN:  Discussed the following assessment and plan:    ICD-10-CM   1. Sore on toe right great toe  L98.9    atraumatic? tip of toe vascular and dm underlying diseases  2. Type 2 diabetes mellitus without complication, without long-term current use of insulin (HCC)  E11.9   3. Late onset Alzheimer's disease with behavioral disturbance ([place])  G30.1    F02.81    Add antibiotic for weekend  ( can only take liquid or crushed pills)  Topical care  And referral if dr Grayce Sessions to dr Skeet [place].   Expectant management and discussion of plan and treatment with opportunity to ask questions and all were answered. The patient agreed with the plan and demonstrated an understanding of the instructions.   Advised to call back or seek an in-person evaluation if worsening  or having  further concerns .  Shanon Ace, MD

## 2019-05-07 NOTE — Addendum Note (Signed)
Addended by: Gwenyth Ober R on: 05/07/2019 03:56 PM   Modules accepted: Orders

## 2019-05-08 DIAGNOSIS — J45909 Unspecified asthma, uncomplicated: Secondary | ICD-10-CM | POA: Diagnosis not present

## 2019-05-08 DIAGNOSIS — E1151 Type 2 diabetes mellitus with diabetic peripheral angiopathy without gangrene: Secondary | ICD-10-CM | POA: Diagnosis not present

## 2019-05-08 DIAGNOSIS — L89891 Pressure ulcer of other site, stage 1: Secondary | ICD-10-CM | POA: Diagnosis not present

## 2019-05-08 DIAGNOSIS — L89621 Pressure ulcer of left heel, stage 1: Secondary | ICD-10-CM | POA: Diagnosis not present

## 2019-05-08 DIAGNOSIS — R1311 Dysphagia, oral phase: Secondary | ICD-10-CM | POA: Diagnosis not present

## 2019-05-08 DIAGNOSIS — Z7984 Long term (current) use of oral hypoglycemic drugs: Secondary | ICD-10-CM | POA: Diagnosis not present

## 2019-05-08 DIAGNOSIS — L89611 Pressure ulcer of right heel, stage 1: Secondary | ICD-10-CM | POA: Diagnosis not present

## 2019-05-08 DIAGNOSIS — G301 Alzheimer's disease with late onset: Secondary | ICD-10-CM | POA: Diagnosis not present

## 2019-05-08 DIAGNOSIS — M810 Age-related osteoporosis without current pathological fracture: Secondary | ICD-10-CM | POA: Diagnosis not present

## 2019-05-08 DIAGNOSIS — I2510 Atherosclerotic heart disease of native coronary artery without angina pectoris: Secondary | ICD-10-CM | POA: Diagnosis not present

## 2019-05-08 DIAGNOSIS — E46 Unspecified protein-calorie malnutrition: Secondary | ICD-10-CM | POA: Diagnosis not present

## 2019-05-08 DIAGNOSIS — M6281 Muscle weakness (generalized): Secondary | ICD-10-CM | POA: Diagnosis not present

## 2019-05-08 DIAGNOSIS — F0280 Dementia in other diseases classified elsewhere without behavioral disturbance: Secondary | ICD-10-CM | POA: Diagnosis not present

## 2019-05-08 DIAGNOSIS — M479 Spondylosis, unspecified: Secondary | ICD-10-CM | POA: Diagnosis not present

## 2019-05-08 DIAGNOSIS — D649 Anemia, unspecified: Secondary | ICD-10-CM | POA: Diagnosis not present

## 2019-05-08 DIAGNOSIS — I10 Essential (primary) hypertension: Secondary | ICD-10-CM | POA: Diagnosis not present

## 2019-05-08 DIAGNOSIS — I447 Left bundle-branch block, unspecified: Secondary | ICD-10-CM | POA: Diagnosis not present

## 2019-05-10 NOTE — Telephone Encounter (Signed)
Pt saw Dr. Regis Bill 05/07/19.

## 2019-05-11 DIAGNOSIS — I10 Essential (primary) hypertension: Secondary | ICD-10-CM | POA: Diagnosis not present

## 2019-05-11 DIAGNOSIS — I2510 Atherosclerotic heart disease of native coronary artery without angina pectoris: Secondary | ICD-10-CM | POA: Diagnosis not present

## 2019-05-11 DIAGNOSIS — E1151 Type 2 diabetes mellitus with diabetic peripheral angiopathy without gangrene: Secondary | ICD-10-CM | POA: Diagnosis not present

## 2019-05-11 DIAGNOSIS — G301 Alzheimer's disease with late onset: Secondary | ICD-10-CM | POA: Diagnosis not present

## 2019-05-11 DIAGNOSIS — E46 Unspecified protein-calorie malnutrition: Secondary | ICD-10-CM | POA: Diagnosis not present

## 2019-05-11 DIAGNOSIS — F0280 Dementia in other diseases classified elsewhere without behavioral disturbance: Secondary | ICD-10-CM | POA: Diagnosis not present

## 2019-05-12 ENCOUNTER — Telehealth: Payer: Self-pay | Admitting: Family Medicine

## 2019-05-12 NOTE — Telephone Encounter (Signed)
Home Health Verbal Orders - Caller/Agency: [facility] Callback Number: [phone] Requesting OT/PT/Skilled Nursing/Social Work/Speech Therapy: Wound care Frequency: 1x for 9wks.

## 2019-05-12 NOTE — Telephone Encounter (Signed)
See note

## 2019-05-12 NOTE — Telephone Encounter (Signed)
Called [name] and provided verbal order for wound care  1 wk 9.

## 2019-05-13 ENCOUNTER — Other Ambulatory Visit: Payer: Self-pay

## 2019-05-13 ENCOUNTER — Ambulatory Visit (INDEPENDENT_AMBULATORY_CARE_PROVIDER_SITE_OTHER): Payer: Medicare Other

## 2019-05-13 ENCOUNTER — Ambulatory Visit (INDEPENDENT_AMBULATORY_CARE_PROVIDER_SITE_OTHER): Payer: Medicare Other | Admitting: Podiatry

## 2019-05-13 VITALS — Temp 97.7°F

## 2019-05-13 DIAGNOSIS — I2510 Atherosclerotic heart disease of native coronary artery without angina pectoris: Secondary | ICD-10-CM | POA: Diagnosis not present

## 2019-05-13 DIAGNOSIS — L97514 Non-pressure chronic ulcer of other part of right foot with necrosis of bone: Secondary | ICD-10-CM | POA: Diagnosis not present

## 2019-05-13 DIAGNOSIS — M8580 Other specified disorders of bone density and structure, unspecified site: Secondary | ICD-10-CM

## 2019-05-13 DIAGNOSIS — M86171 Other acute osteomyelitis, right ankle and foot: Secondary | ICD-10-CM

## 2019-05-13 DIAGNOSIS — E11621 Type 2 diabetes mellitus with foot ulcer: Secondary | ICD-10-CM | POA: Diagnosis not present

## 2019-05-13 DIAGNOSIS — G301 Alzheimer's disease with late onset: Secondary | ICD-10-CM | POA: Diagnosis not present

## 2019-05-13 DIAGNOSIS — E1151 Type 2 diabetes mellitus with diabetic peripheral angiopathy without gangrene: Secondary | ICD-10-CM | POA: Diagnosis not present

## 2019-05-13 DIAGNOSIS — E46 Unspecified protein-calorie malnutrition: Secondary | ICD-10-CM | POA: Diagnosis not present

## 2019-05-13 DIAGNOSIS — I10 Essential (primary) hypertension: Secondary | ICD-10-CM | POA: Diagnosis not present

## 2019-05-13 DIAGNOSIS — L97521 Non-pressure chronic ulcer of other part of left foot limited to breakdown of skin: Secondary | ICD-10-CM

## 2019-05-13 DIAGNOSIS — M79671 Pain in right foot: Secondary | ICD-10-CM

## 2019-05-13 DIAGNOSIS — F0280 Dementia in other diseases classified elsewhere without behavioral disturbance: Secondary | ICD-10-CM | POA: Diagnosis not present

## 2019-05-13 NOTE — Patient Instructions (Signed)
Pre-Operative Instructions  Congratulations, you have decided to take an important step towards improving your quality of life.  You can be assured that the doctors and staff at [facility] will be with you every step of the way.  Here are some important things you should know:  1. Plan to be at the surgery center/hospital at least 1 (one) hour prior to your scheduled time, unless otherwise directed by the surgical center/hospital staff.  You must have a responsible adult accompany you, remain during the surgery and drive you home.  Make sure you have directions to the surgical center/hospital to ensure you arrive on time. 2. If you are having surgery at [facility] or [facility], you will need a copy of your medical history and physical form from your family physician within one month prior to the date of surgery. We will give you a form for your primary physician to complete.  3. We make every effort to accommodate the date you request for surgery.  However, there are times where surgery dates or times have to be moved.  We will contact you as soon as possible if a change in schedule is required.   4. No aspirin/ibuprofen for one week before surgery.  If you are on aspirin, any non-steroidal anti-inflammatory medications (Mobic, Aleve, Ibuprofen) should not be taken seven (7) days prior to your surgery.  You make take Tylenol for pain prior to surgery.  5. Medications - If you are taking daily heart and blood pressure medications, seizure, reflux, allergy, asthma, anxiety, pain or diabetes medications, make sure you notify the surgery center/hospital before the day of surgery so they can tell you which medications you should take or avoid the day of surgery. 6. No food or drink after midnight the night before surgery unless directed otherwise by surgical center/hospital staff. 7. No alcoholic beverages 24-hours prior to surgery.  No smoking 24-hours prior or 24-hours after  surgery. 8. Wear loose pants or shorts. They should be loose enough to fit over bandages, boots, and casts. 9. Don't wear slip-on shoes. Sneakers are preferred. 10. Bring your boot with you to the surgery center/hospital.  Also bring crutches or a walker if your physician has prescribed it for you.  If you do not have this equipment, it will be provided for you after surgery. 11. If you have not been contacted by the surgery center/hospital by the day before your surgery, call to confirm the date and time of your surgery. 12. Leave-time from work may vary depending on the type of surgery you have.  Appropriate arrangements should be made prior to surgery with your employer. 13. Prescriptions will be provided immediately following surgery by your doctor.  Fill these as soon as possible after surgery and take the medication as directed. Pain medications will not be refilled on weekends and must be approved by the doctor. 14. Remove nail polish on the operative foot and avoid getting pedicures prior to surgery. 15. Wash the night before surgery.  The night before surgery wash the foot and leg well with water and the antibacterial soap provided. Be sure to pay special attention to beneath the toenails and in between the toes.  Wash for at least three (3) minutes. Rinse thoroughly with water and dry well with a towel.  Perform this wash unless told not to do so by your physician.  Enclosed: 1 Ice pack (please put in freezer the night before surgery)   1 Hibiclens skin cleaner     Pre-op instructions  If you have any questions regarding the instructions, please do not hesitate to call our office.  [place]: [address], [place], [place] [address] -- [phone]  [place]: [address] [place], [place] [address] -- [phone]  [place]: [address]  [place], [place] [address] -- [phone]  [place]: [address], [address], [place], [place] [address] -- [phone]  Website:  [email or web address] 

## 2019-05-14 ENCOUNTER — Other Ambulatory Visit: Payer: Self-pay | Admitting: Podiatry

## 2019-05-14 DIAGNOSIS — M86171 Other acute osteomyelitis, right ankle and foot: Secondary | ICD-10-CM

## 2019-05-16 NOTE — Progress Notes (Signed)
Subjective:  Patient ID: [name], female    DOB: [birth year],  MRN: [id number]  Chief Complaint  Patient presents with  . Wound Check    Right 1st wound check. Pt guardians states no changes. Denies fever/nausea/vomiting/chills/drainage.    85 y.o. female presents for wound care.  Chronic wound to the right foot treated by home health care.  Denies recent changes but currently on antibiotics for the wound.  Review of Systems: Negative except as noted in the HPI. Denies N/V/F/Ch.  Past Medical History:  Diagnosis Date  . Alzheimer's dementia without behavioral disturbance ([place])    diagnosed 12/2015; Dr. Jade/neurology  . Anemia   . Asthma   . Blood transfusion 05-24-12  . Blood transfusion without reported diagnosis   . Chronic kidney disease    dr Clover Mealy  . Diabetes mellitus   . Diverticulosis   . DVT (deep venous thrombosis) ([place]) 03/2005   hx.left leg  . Esophageal stricture   . GERD (gastroesophageal reflux disease)   . Glaucoma   . Hiatal hernia 05/07/13  . Hyperlipidemia   . Hypertension   . Osteoarthritis   . Pancreatic duct dilated 05/07/13  . Pulmonary nodules 05/07/13  . PVD (peripheral vascular disease) ([place])   . Tubular adenoma of colon     Current Outpatient Medications:  .  albuterol (PROAIR HFA) 108 (90 Base) MCG/ACT inhaler, 2 PUFFS EVERY 6 HOURS AS NEEDED FOR SHORTNESS OF BREATH., Disp: 17 g, Rfl: 5 .  albuterol (PROVENTIL) (2.5 MG/3ML) 0.083% nebulizer solution, USE THREE MILLILITERS VIA NEBULIZATION BY MOUTH EVERY 6 HOURS AS NEEDED FOR WHEEZING OR SHORTNESS OF BREATH, Disp: 300 mL, Rfl: 1 .  amoxicillin-clavulanate (AUGMENTIN) 400-57 MG/5ML suspension, Take 7.5 mLs (600 mg total) by mouth 2 (two) times daily., Disp: 100 mL, Rfl: 1 .  Ascorbic Acid (VITAMIN C) 1000 MG tablet, Take 1,000 mg by mouth daily., Disp: , Rfl:  .  aspirin 81 MG tablet, Take 81 mg by mouth daily., Disp: , Rfl:  .  azelastine (ASTELIN) 0.1 % nasal spray, Place 1 spray into both  nostrils 2 (two) times daily. Use in each nostril as directed, Disp: 30 mL, Rfl: 11 .  brimonidine (ALPHAGAN P) 0.1 % SOLN, INSTILL 1 DROP IN BOTH EYE TWICE DAILY., Disp: , Rfl:  .  budesonide (PULMICORT) 0.25 MG/2ML nebulizer solution, Take 2 mLs (0.25 mg total) by nebulization daily. ICD10 Code-J45.909, Disp: 60 mL, Rfl: 12 .  calcium carbonate (OS-CAL) 600 MG TABS, Take 600 mg by mouth 2 (two) times daily with a meal., Disp: , Rfl:  .  citalopram (CELEXA) 10 MG tablet, TAKE 1 TABLET ONCE DAILY., Disp: 90 tablet, Rfl: 0 .  donepezil (ARICEPT) 10 MG tablet, Take 1 tablet (10 mg total) by mouth at bedtime., Disp: 90 tablet, Rfl: 3 .  famotidine (PEPCID) 20 MG tablet, Take 1 tablet (20 mg total) by mouth 2 (two) times daily as needed for heartburn or indigestion., Disp: 60 tablet, Rfl: 5 .  IRON PO, Take 1 day or 1 dose by mouth., Disp: , Rfl:  .  levothyroxine (SYNTHROID, LEVOTHROID) 25 MCG tablet, Take by mouth., Disp: , Rfl:  .  memantine (NAMENDA) 10 MG tablet, TAKE 1 TABLET TWICE DAILY FOR MEMORY., Disp: 60 tablet, Rfl: 5 .  Multiple Vitamin (MULTI-VITAMIN) tablet, Take by mouth., Disp: , Rfl:  .  Multiple Vitamin (MULTIVITAMIN WITH MINERALS) TABS tablet, Take 1 tablet by mouth daily., Disp: , Rfl:  .  mupirocin ointment (BACTROBAN) 2 %, Apply  1 application topically 2 (two) times daily. Apply to the affected area 2 times a day, Disp: 22 g, Rfl: 3 .  nitroGLYCERIN (NITRODUR - DOSED IN MG/24 HR) 0.2 mg/hr patch, Place 1 patch (0.2 mg total) onto the skin daily., Disp: 30 patch, Rfl: 12  Social History   Tobacco Use  Smoking Status Never Smoker  Smokeless Tobacco Never Used    No Known Allergies Objective:   Vitals:   05/13/19 1616  Temp: 97.7 F (36.5 C)   There is no height or weight on file to calculate BMI. Constitutional Well developed. Well nourished.  Vascular Dorsalis pedis pulses faintly palpable bilaterally. Posterior tibial pulses non-palpable bilaterally. Capillary  refill normal to all digits.  No cyanosis or clubbing noted. Pedal hair growth absent.  Neurologic Normal speech. Oriented to person, place, and time. Protective sensation absent  Dermatologic Ulceration distal aspect of the right toe with necrosis and probe to bone.  Mild malodor, no active warmth erythema no ascending cellulitis no signs of acute infection  Orthopedic: No pain to palpation either foot.   Radiographs: Taken reviewed necrosis of the distal phalanx of the hallux noted Assessment:   1. Ulcer of great toe, right, with necrosis of bone ([place])   2. Type II diabetes mellitus with peripheral circulatory disorder (HCC)   3. Diabetic ulcer of toe of left foot associated with type 2 diabetes mellitus, limited to breakdown of skin ([place])   4. Bone erosion    Plan:  Patient was evaluated and treated and all questions answered.  Ulcer right great toe -Debridement as below. -Dressed with Betadine, DSD. -Off-loading with surgical shoe. -Order noninvasive studies to review at next visit. -Continue antibiotics.  Advised to call for refill of the antibiotic if needed -Discussed need for possible amputation of the digit due to distal osteo-myelitis.  Consent forms reviewed and signed by patient.  We will plan for surgical intervention in the coming weeks.  Patient will need to be cleared by her primary care provider.  Patient is not able to make decisions for herself and her power attorney is her husband.  We will get vascular studies prior to procedure and will review next visit.  35 minutes of face to face time were spent with the patient. >50% of this was spent on counseling and coordination of care. Specifically discussed with patient the above diagnoses and overall treatment plan.   Return in about 1 week (around 05/20/2019).

## 2019-05-18 ENCOUNTER — Other Ambulatory Visit: Payer: Self-pay

## 2019-05-18 ENCOUNTER — Ambulatory Visit (HOSPITAL_COMMUNITY)
Admission: RE | Admit: 2019-05-18 | Discharge: 2019-05-18 | Disposition: A | Discharge status: Home / Self Care | Payer: Medicare Other | Source: Ambulatory Visit | Attending: Podiatry | Admitting: Podiatry

## 2019-05-18 ENCOUNTER — Telehealth: Payer: Self-pay | Admitting: *Deleted

## 2019-05-18 DIAGNOSIS — G301 Alzheimer's disease with late onset: Secondary | ICD-10-CM | POA: Diagnosis not present

## 2019-05-18 DIAGNOSIS — L97514 Non-pressure chronic ulcer of other part of right foot with necrosis of bone: Secondary | ICD-10-CM | POA: Diagnosis not present

## 2019-05-18 DIAGNOSIS — F0280 Dementia in other diseases classified elsewhere without behavioral disturbance: Secondary | ICD-10-CM | POA: Diagnosis not present

## 2019-05-18 DIAGNOSIS — E11621 Type 2 diabetes mellitus with foot ulcer: Secondary | ICD-10-CM | POA: Diagnosis not present

## 2019-05-18 DIAGNOSIS — E1151 Type 2 diabetes mellitus with diabetic peripheral angiopathy without gangrene: Secondary | ICD-10-CM | POA: Insufficient documentation

## 2019-05-18 DIAGNOSIS — I2510 Atherosclerotic heart disease of native coronary artery without angina pectoris: Secondary | ICD-10-CM | POA: Diagnosis not present

## 2019-05-18 DIAGNOSIS — I10 Essential (primary) hypertension: Secondary | ICD-10-CM | POA: Diagnosis not present

## 2019-05-18 DIAGNOSIS — L97521 Non-pressure chronic ulcer of other part of left foot limited to breakdown of skin: Secondary | ICD-10-CM | POA: Diagnosis not present

## 2019-05-18 DIAGNOSIS — E46 Unspecified protein-calorie malnutrition: Secondary | ICD-10-CM | POA: Diagnosis not present

## 2019-05-18 NOTE — Telephone Encounter (Signed)
I am calling to schedule [name]'s surgery.  "You must have gotten the results back already.  We had that test done this morning."  I was not aware that she was having a test.  I was just told to call and schedule the surgery.  What kind of test did she have?  "She had a test to check her circulation.  You're trying to carry the cart without a horse, aren't you?  We're coming in to see him for a follow-up appointment on Thursday."  I apologize, I was told to give you a call, no one mentioned the doppler study to me.  He'll give you the results of the test on Thursday.

## 2019-05-20 ENCOUNTER — Ambulatory Visit (INDEPENDENT_AMBULATORY_CARE_PROVIDER_SITE_OTHER): Payer: Medicare Other | Admitting: Podiatry

## 2019-05-20 ENCOUNTER — Other Ambulatory Visit: Payer: Self-pay

## 2019-05-20 DIAGNOSIS — M8580 Other specified disorders of bone density and structure, unspecified site: Secondary | ICD-10-CM

## 2019-05-20 DIAGNOSIS — L97514 Non-pressure chronic ulcer of other part of right foot with necrosis of bone: Secondary | ICD-10-CM | POA: Diagnosis not present

## 2019-05-20 DIAGNOSIS — I739 Peripheral vascular disease, unspecified: Secondary | ICD-10-CM | POA: Diagnosis not present

## 2019-05-20 DIAGNOSIS — E1151 Type 2 diabetes mellitus with diabetic peripheral angiopathy without gangrene: Secondary | ICD-10-CM | POA: Diagnosis not present

## 2019-05-20 DIAGNOSIS — I998 Other disorder of circulatory system: Secondary | ICD-10-CM

## 2019-05-20 MED ORDER — AMOXICILLIN-POT CLAVULANATE 400-57 MG/5ML PO SUSR: 600.0000 mg | Freq: Two times a day (BID) | ORAL | 1 refills | Status: DC

## 2019-05-21 ENCOUNTER — Telehealth: Payer: Self-pay | Admitting: Podiatry

## 2019-05-21 ENCOUNTER — Telehealth: Payer: Self-pay | Admitting: Family Medicine

## 2019-05-21 DIAGNOSIS — I2510 Atherosclerotic heart disease of native coronary artery without angina pectoris: Secondary | ICD-10-CM | POA: Diagnosis not present

## 2019-05-21 DIAGNOSIS — E46 Unspecified protein-calorie malnutrition: Secondary | ICD-10-CM | POA: Diagnosis not present

## 2019-05-21 DIAGNOSIS — I10 Essential (primary) hypertension: Secondary | ICD-10-CM | POA: Diagnosis not present

## 2019-05-21 DIAGNOSIS — G301 Alzheimer's disease with late onset: Secondary | ICD-10-CM | POA: Diagnosis not present

## 2019-05-21 DIAGNOSIS — E1151 Type 2 diabetes mellitus with diabetic peripheral angiopathy without gangrene: Secondary | ICD-10-CM | POA: Diagnosis not present

## 2019-05-21 DIAGNOSIS — F0280 Dementia in other diseases classified elsewhere without behavioral disturbance: Secondary | ICD-10-CM | POA: Diagnosis not present

## 2019-05-21 NOTE — Telephone Encounter (Signed)
Saw patient and spoke at length. They are thinking about surgery will discuss at next visit.

## 2019-05-21 NOTE — Telephone Encounter (Signed)
Pt spouse was updated on information as well as appt.

## 2019-05-21 NOTE — Telephone Encounter (Signed)
Pt has an appt with Dr. March Rummage / Dr. March Rummage wants to amputate part of her right big toe/ Pt wants an appt with Dr. Yong Channel for his opinion and has paper work from Dr. March Rummage that Dr. Yong Channel will need to complete. Pt needs to be seen before her next appt with Dr. March Rummage that is on 8.13.20/ please advise

## 2019-05-21 NOTE — Telephone Encounter (Signed)
Pt and spouse were not available . Called pt daughter [name] and spoke with her for clarification. [name] stated pt just needs an appt. For surgical clearance before appt. With Dr. March Rummage. [name] stated that pr was given abx for toe and pt is doing good. [name] was advised to look out for signs of gangrene in the process. Pt has been scheduled in 1140 hold slot since we are booked out for 3 weeks.

## 2019-05-21 NOTE — Telephone Encounter (Signed)
[facility] called requesting wound care orders for patient.  Please fax to-  [phone]

## 2019-05-23 NOTE — Progress Notes (Signed)
Subjective:  Patient ID: [name], female    DOB: 1931/06/29,  MRN: [id number]  No chief complaint on file.   85 y.o. female presents for wound care.  Here for follow-up vascular studies.  Presents with her husband and her daughter.  Patient does not make decisions for herself.  She expressed that she does not want her toe removed.  States it does not hurt her  Review of Systems: Negative except as noted in the HPI. Denies N/V/F/Ch.  Past Medical History:  Diagnosis Date  . Alzheimer's dementia without behavioral disturbance ([place])    diagnosed 12/2015; Dr. Jade/neurology  . Anemia   . Asthma   . Blood transfusion 05-24-12  . Blood transfusion without reported diagnosis   . Chronic kidney disease    dr Clover Mealy  . Diabetes mellitus   . Diverticulosis   . DVT (deep venous thrombosis) ([place]) 03/2005   hx.left leg  . Esophageal stricture   . GERD (gastroesophageal reflux disease)   . Glaucoma   . Hiatal hernia 05/07/13  . Hyperlipidemia   . Hypertension   . Osteoarthritis   . Pancreatic duct dilated 05/07/13  . Pulmonary nodules 05/07/13  . PVD (peripheral vascular disease) ([place])   . Tubular adenoma of colon     Current Outpatient Medications:  .  albuterol (PROAIR HFA) 108 (90 Base) MCG/ACT inhaler, 2 PUFFS EVERY 6 HOURS AS NEEDED FOR SHORTNESS OF BREATH., Disp: 17 g, Rfl: 5 .  albuterol (PROVENTIL) (2.5 MG/3ML) 0.083% nebulizer solution, USE THREE MILLILITERS VIA NEBULIZATION BY MOUTH EVERY 6 HOURS AS NEEDED FOR WHEEZING OR SHORTNESS OF BREATH, Disp: 300 mL, Rfl: 1 .  amoxicillin-clavulanate (AUGMENTIN) 400-57 MG/5ML suspension, Take 7.5 mLs (600 mg total) by mouth 2 (two) times daily., Disp: 100 mL, Rfl: 1 .  Ascorbic Acid (VITAMIN C) 1000 MG tablet, Take 1,000 mg by mouth daily., Disp: , Rfl:  .  aspirin 81 MG tablet, Take 81 mg by mouth daily., Disp: , Rfl:  .  azelastine (ASTELIN) 0.1 % nasal spray, Place 1 spray into both nostrils 2 (two) times daily. Use in each nostril as  directed, Disp: 30 mL, Rfl: 11 .  brimonidine (ALPHAGAN P) 0.1 % SOLN, INSTILL 1 DROP IN BOTH EYE TWICE DAILY., Disp: , Rfl:  .  budesonide (PULMICORT) 0.25 MG/2ML nebulizer solution, Take 2 mLs (0.25 mg total) by nebulization daily. ICD10 Code-J45.909, Disp: 60 mL, Rfl: 12 .  calcium carbonate (OS-CAL) 600 MG TABS, Take 600 mg by mouth 2 (two) times daily with a meal., Disp: , Rfl:  .  citalopram (CELEXA) 10 MG tablet, TAKE 1 TABLET ONCE DAILY., Disp: 90 tablet, Rfl: 0 .  donepezil (ARICEPT) 10 MG tablet, Take 1 tablet (10 mg total) by mouth at bedtime., Disp: 90 tablet, Rfl: 3 .  famotidine (PEPCID) 20 MG tablet, Take 1 tablet (20 mg total) by mouth 2 (two) times daily as needed for heartburn or indigestion., Disp: 60 tablet, Rfl: 5 .  IRON PO, Take 1 day or 1 dose by mouth., Disp: , Rfl:  .  levothyroxine (SYNTHROID, LEVOTHROID) 25 MCG tablet, Take by mouth., Disp: , Rfl:  .  memantine (NAMENDA) 10 MG tablet, TAKE 1 TABLET TWICE DAILY FOR MEMORY., Disp: 60 tablet, Rfl: 5 .  Multiple Vitamin (MULTI-VITAMIN) tablet, Take by mouth., Disp: , Rfl:  .  Multiple Vitamin (MULTIVITAMIN WITH MINERALS) TABS tablet, Take 1 tablet by mouth daily., Disp: , Rfl:  .  mupirocin ointment (BACTROBAN) 2 %, Apply 1 application topically  2 (two) times daily. Apply to the affected area 2 times a day, Disp: 22 g, Rfl: 3 .  nitroGLYCERIN (NITRODUR - DOSED IN MG/24 HR) 0.2 mg/hr patch, Place 1 patch (0.2 mg total) onto the skin daily., Disp: 30 patch, Rfl: 12  Social History   Tobacco Use  Smoking Status Never Smoker  Smokeless Tobacco Never Used    No Known Allergies Objective:   There were no vitals filed for this visit. There is no height or weight on file to calculate BMI. Constitutional Well developed. Well nourished.  Vascular Dorsalis pedis pulses faintly palpable bilaterally. Posterior tibial pulses non-palpable bilaterally. Capillary refill normal to all digits.  No cyanosis or clubbing noted.  Pedal hair growth absent.  Neurologic Normal speech. Pleasantly confused. Protective sensation absent  Dermatologic Ulceration distal aspect of the right toe with fibrous base. Still active warmth slight erythema  Orthopedic: No pain to palpation either foot.   Radiographs: None today Assessment:   1. Ulcer of great toe, right, with necrosis of bone ([place])   2. Type II diabetes mellitus with peripheral circulatory disorder ([facility])   3. Bone erosion   4. PAD (peripheral artery disease) ([place])   5. Vascular calcification    Plan:  Patient was evaluated and treated and all questions answered.  Ulcer right great toe with osteomyelitis -Had lengthy conversation with patient, her husband and her daughter about possible surgery and expected outcomes.  Discussed risks and benefits of surgery versus not proceed with surgery.  Reviewed vascular studies with patient.  Discussed that while they are likely falsely elevated due to calcification she does have good pressure in the right great toe and I am hopeful that she should heal uneventfully.  I stated that I cannot guarantee healing potential.  I encouraged follow-up with Dr. Donnetta Hutching who read the vascular studies for his opinion on her healing potential should they wish. -I did discuss possibly just resecting the bone if they refuse amputation.  This would also allow for bone culture for more talar antibiotic therapy.  They will have to do this with the understanding that this would not be the optimal treatment however. -She does still have redness to the toe and so I am concerned that the infection was not completely controlled with antibiotics. I discussed that as long as she has osteomyelitis without complete suppression this could lead to seeding of bacteria in the bloodstream and could make her sick.  Family verbalized understanding.  They wish to think this however on further discuss with the rest of the family before making decision.  I encouraged them  to take the time they need to make a decision. -Refill patient's antibiotics. 30 minutes of face to face time were spent with the patient. >50% of this was spent on counseling and coordination of care. Specifically discussed with patient the above diagnoses and overall treatment plan.   No follow-ups on file.

## 2019-05-24 NOTE — Telephone Encounter (Signed)
Medihoney and bandage to wound three times a week

## 2019-05-24 NOTE — Progress Notes (Signed)
Phone [phone]   Subjective:  [name] is a 85 y.o. year old very pleasant female patient who presents for/with See problem oriented charting Chief Complaint  Patient presents with  . Ulcer of Right Great Toe with Necrosis of Bone   ROS- no reported fever or chills.  Denies toe pain.  Does have some redness on the right great toe.  No worsening confusion from baseline reported  Past Medical History-  Patient Active Problem List   Diagnosis Date Noted  . History of DVT in adulthood 06/26/2017    Priority: High  . Coronary artery calcification seen on CAT scan 08/28/2016    Priority: High  . Alzheimer's disease ([place]) 01/04/2016    Priority: High  . Diabetes ([place]) 12/18/2006    Priority: High  . Atherosclerosis of native arteries of extremity with intermittent claudication ([place]) 12/18/2006    Priority: High  . Generalized weakness 05/02/2018    Priority: Medium  . Pancreas cyst 07/02/2015    Priority: Medium  . Asthma, chronic 03/16/2015    Priority: Medium  . Primary open angle glaucoma of left eye, moderate stage 12/25/2014    Priority: Medium  . Hypertension associated with diabetes ([place]) 12/18/2006    Priority: Medium  . GERD (gastroesophageal reflux disease) 05/02/2018    Priority: Low  . LBBB (left bundle branch block) 10/09/2016    Priority: Low  . Pleural effusion, left 09/23/2016    Priority: Low  . Pseudophakia of both eyes 02/15/2013    Priority: Low  . Anemia 05/25/2012    Priority: Low  . Astigmatism 04/29/2012    Priority: Low  . Allergic rhinitis 10/30/2010    Priority: Low  . Venous (peripheral) insufficiency 04/13/2007    Priority: Low  . OSTEOARTHRITIS OF SPINE, NOS 12/18/2006    Priority: Low  . Protein-calorie malnutrition ([place]) 07/21/2018  . Osteoporosis 07/02/2018  . Diabetic ulcer of foot with fat layer exposed ([place]) 05/14/2018    Medications- reviewed and updated Current Outpatient Medications  Medication Sig Dispense Refill  .  albuterol (PROAIR HFA) 108 (90 Base) MCG/ACT inhaler 2 PUFFS EVERY 6 HOURS AS NEEDED FOR SHORTNESS OF BREATH. 17 g 5  . albuterol (PROVENTIL) (2.5 MG/3ML) 0.083% nebulizer solution USE THREE MILLILITERS VIA NEBULIZATION BY MOUTH EVERY 6 HOURS AS NEEDED FOR WHEEZING OR SHORTNESS OF BREATH 300 mL 1  . amoxicillin-clavulanate (AUGMENTIN) 400-57 MG/5ML suspension Take 7.5 mLs (600 mg total) by mouth 2 (two) times daily. 100 mL 1  . Ascorbic Acid (VITAMIN C) 1000 MG tablet Take 1,000 mg by mouth daily.    Marland Kitchen aspirin 81 MG tablet Take 81 mg by mouth daily.    Marland Kitchen azelastine (ASTELIN) 0.1 % nasal spray Place 1 spray into both nostrils 2 (two) times daily. Use in each nostril as directed 30 mL 11  . brimonidine (ALPHAGAN P) 0.1 % SOLN INSTILL 1 DROP IN BOTH EYE TWICE DAILY.    . budesonide (PULMICORT) 0.25 MG/2ML nebulizer solution Take 2 mLs (0.25 mg total) by nebulization daily. ICD10 Code-J45.909 60 mL 12  . calcium carbonate (OS-CAL) 600 MG TABS Take 600 mg by mouth 2 (two) times daily with a meal.    . citalopram (CELEXA) 10 MG tablet TAKE 1 TABLET ONCE DAILY. 90 tablet 0  . donepezil (ARICEPT) 10 MG tablet Take 1 tablet (10 mg total) by mouth at bedtime. 90 tablet 3  . famotidine (PEPCID) 20 MG tablet Take 1 tablet (20 mg total) by mouth 2 (two) times daily as  needed for heartburn or indigestion. 60 tablet 5  . IRON PO Take 1 day or 1 dose by mouth.    . levothyroxine (SYNTHROID, LEVOTHROID) 25 MCG tablet Take by mouth.    . memantine (NAMENDA) 10 MG tablet TAKE 1 TABLET TWICE DAILY FOR MEMORY. 60 tablet 5  . Multiple Vitamin (MULTIVITAMIN WITH MINERALS) TABS tablet Take 1 tablet by mouth daily.    . mupirocin ointment (BACTROBAN) 2 % Apply 1 application topically 2 (two) times daily. Apply to the affected area 2 times a day 22 g 3  . nitroGLYCERIN (NITRODUR - DOSED IN MG/24 HR) 0.2 mg/hr patch Place 1 patch (0.2 mg total) onto the skin daily. 30 patch 12  . Wound Dressings (MEDIHONEY WOUND/BURN  DRESSING) GEL Apply to affected are 3 times a week, and cover with sterile dressing. 30 mL 5   No current facility-administered medications for this visit.      Objective:  BP 122/70 (BP Location: Left Arm, Patient Position: Sitting, Cuff Size: Normal)   Pulse 73   Temp 97.7 F (36.5 C) (Oral)   Ht 5\' 6"  (1.676 m)   LMP  (LMP Unknown)   SpO2 95%   BMI 18.08 kg/m  Gen: NAD, resting comfortably CV: RRR no murmurs rubs or gallops Lungs: CTAB no crackles, wheeze, rhonchi Skin: warm, dry MSK: right great toe with at least 1 x 1 cm ulcerated area though now scabbed over- no pain with palpation. Has some mild erythema without warmth around this extendng up past toenail.     Assessment and Plan   # Ulcer of Right Great Toe  S:Pt is type 2 diabetic.  Previously followed by Dr. Sharol Given for diabetic foot wounds.Last visit with Dr. Sharol Given Was in March and at that time had no diabetic foot ulcerations.  Family reports this wound had developed sometime after seeing Dr. Sharol Given last - they are not exactly sure when but at least over a month ago-home health nurse eventually recommended further evaluation   Has been seen by Dr. March Rummage with podiatry since May 13, 2019-    Had been started on antibiotics 6 days prior by Dr. Regis Bill and a message was sent to our team about placing referral back to Dr. Sharol Given- I approved this but appears was placed for podiatry instead of orthopedics. . I believed at that time we were getting patient set back up with Dr. Sharol Given. With Dr. Bronson Curb discussed possible R great toe amputation. Currently taking Augmentin 400-57 mg/ 5 mL, 7.5 mL BID. Had XR R foot 05/13/2019 and doppler 05/18/19.  There is a lot of debate in the family whether patient should move forward with amputation and they want to feel more confident in decision.   A/P: appears stable- continue antibiotics and avoid pressure on area. Patient does not want surgery and due to dementia hard to help patient understand  importance of intervention.   I certainly respect Dr. Eleanora Neighbor expertise. Despite this, there is a lot of debate within patient's family whether she should move forward with amputation of great toe at her age (if they do not would likely need to consider ID opinion and long term antibiotics)..  Since patient has previously seen Dr. Sharol Given- I think we should get his opinion before moving forward with surgery to help with family concerns and get everyone on the same page and give validation if moving forward with surgery is best choice (I told them I suspected it was). Patient would need cardiac clearance with Dr.  Ellyn Hack her cardiologist before procedure either way.  - would have updated labs today but our phlebotomist was not available at the end of our visit today.  -a1c looks excellent today in regards to potential for wound healing -had initial vascular studies with podiatry  Recommended follow up: prn for this acute issue. Will reach out to referral coordinator- entered referral as urgent but was scheduled about 2 weeks out.  Future Appointments  Date Time Provider [place]  06/03/2019  3:45 PM Evelina Bucy, DPM [facility]  06/07/2019  1:45 PM Newt Minion, MD [facility] None  06/23/2019 11:15 AM Marzetta Board, DPM [facility]   Lab/Order associations:   ICD-10-CM   1. Diabetic ulcer of toe of right foot associated with type 2 diabetes mellitus, unspecified ulcer stage ([place])  E11.621 Ambulatory referral to Orthopedics   L97.519 POCT glycosylated hemoglobin (Hb A1C)  2. Type 2 diabetes mellitus with foot ulcer, without long-term current use of insulin ([facility])  E11.621 Ambulatory referral to Orthopedics   L97.509 POCT glycosylated hemoglobin (Hb A1C)   Time Stamp The duration of face-to-face time during this visit was greater than 25 minutes. Greater than 50% of this time was spent in counseling, explanation of diagnosis, planning of further management, and/or  coordination of care including discussing family concerns about surgery, discussing needs for cardiac clearance due to her limited mobility and inability to assess symptoms with 4 METS.    Return precautions advised.  Garret Reddish, MD

## 2019-05-24 NOTE — Patient Instructions (Addendum)
Health Maintenance Due  Topic Date Due  . HEMOGLOBIN A1C- today 04/02/2019  . OPHTHALMOLOGY EXAM - If you have had your eye exam within the last year, please sign release of information at check out desk. If you have not had an eye exam within a year, please get one at this time as this is important for your diabetes care  05/19/2019  . INFLUENZA VACCINE - recommend fall flu shot 05/22/2019   Urgent referral to Dr. Sharol Given- hopeful to have you seen by end of the week  May need full updated bloodwork if we move forward with surgery

## 2019-05-25 ENCOUNTER — Ambulatory Visit (INDEPENDENT_AMBULATORY_CARE_PROVIDER_SITE_OTHER): Payer: Medicare Other | Admitting: Family Medicine

## 2019-05-25 ENCOUNTER — Other Ambulatory Visit: Payer: Self-pay

## 2019-05-25 ENCOUNTER — Encounter: Payer: Self-pay | Admitting: Family Medicine

## 2019-05-25 ENCOUNTER — Telehealth: Payer: Self-pay | Admitting: Family Medicine

## 2019-05-25 VITALS — BP 122/70 | HR 73 | Temp 97.7°F | Ht 66.0 in

## 2019-05-25 DIAGNOSIS — E11621 Type 2 diabetes mellitus with foot ulcer: Secondary | ICD-10-CM

## 2019-05-25 DIAGNOSIS — E46 Unspecified protein-calorie malnutrition: Secondary | ICD-10-CM | POA: Diagnosis not present

## 2019-05-25 DIAGNOSIS — L97509 Non-pressure chronic ulcer of other part of unspecified foot with unspecified severity: Secondary | ICD-10-CM | POA: Diagnosis not present

## 2019-05-25 DIAGNOSIS — I2510 Atherosclerotic heart disease of native coronary artery without angina pectoris: Secondary | ICD-10-CM | POA: Diagnosis not present

## 2019-05-25 DIAGNOSIS — L97519 Non-pressure chronic ulcer of other part of right foot with unspecified severity: Secondary | ICD-10-CM

## 2019-05-25 DIAGNOSIS — I10 Essential (primary) hypertension: Secondary | ICD-10-CM | POA: Diagnosis not present

## 2019-05-25 DIAGNOSIS — F0280 Dementia in other diseases classified elsewhere without behavioral disturbance: Secondary | ICD-10-CM | POA: Diagnosis not present

## 2019-05-25 DIAGNOSIS — G301 Alzheimer's disease with late onset: Secondary | ICD-10-CM | POA: Diagnosis not present

## 2019-05-25 DIAGNOSIS — E1151 Type 2 diabetes mellitus with diabetic peripheral angiopathy without gangrene: Secondary | ICD-10-CM | POA: Diagnosis not present

## 2019-05-25 LAB — POCT GLYCOSYLATED HEMOGLOBIN (HGB A1C): Hemoglobin A1C: 6.4 % — AB (ref 4.0–5.6)

## 2019-05-25 MED ORDER — MEDIHONEY WOUND/BURN DRESSING EX GEL: CUTANEOUS | 5 refills | Status: DC

## 2019-05-25 NOTE — Telephone Encounter (Signed)
Copied from [place] [id number]. Topic: Quick Communication - Home Health Verbal Orders >> May 25, 2019 10:12 AM Nils Flack wrote: Caller/Agency: [facility]  Callback Number: [phone] Requesting OT/PT/Skilled Nursing/Social Work/Speech Therapy: wound care  Frequency: 2 week 6 1 week 1

## 2019-05-25 NOTE — Telephone Encounter (Signed)
See note

## 2019-05-25 NOTE — Telephone Encounter (Signed)
Faxed copy of Dr. March Rummage orders of 05/24/2019 10:49pm to [facility].

## 2019-05-25 NOTE — Addendum Note (Signed)
Addended by: Harriett Sine D on: 05/25/2019 10:05 AM   Modules accepted: Orders

## 2019-05-25 NOTE — Telephone Encounter (Signed)
Called [facility] and provided verbal order for wound care 2 week 6 and 1 week 1.

## 2019-05-31 ENCOUNTER — Telehealth: Payer: Self-pay

## 2019-05-31 DIAGNOSIS — E46 Unspecified protein-calorie malnutrition: Secondary | ICD-10-CM | POA: Diagnosis not present

## 2019-05-31 DIAGNOSIS — I10 Essential (primary) hypertension: Secondary | ICD-10-CM | POA: Diagnosis not present

## 2019-05-31 DIAGNOSIS — E1151 Type 2 diabetes mellitus with diabetic peripheral angiopathy without gangrene: Secondary | ICD-10-CM | POA: Diagnosis not present

## 2019-05-31 DIAGNOSIS — F0280 Dementia in other diseases classified elsewhere without behavioral disturbance: Secondary | ICD-10-CM | POA: Diagnosis not present

## 2019-05-31 DIAGNOSIS — I2510 Atherosclerotic heart disease of native coronary artery without angina pectoris: Secondary | ICD-10-CM | POA: Diagnosis not present

## 2019-05-31 DIAGNOSIS — G301 Alzheimer's disease with late onset: Secondary | ICD-10-CM | POA: Diagnosis not present

## 2019-05-31 NOTE — Telephone Encounter (Signed)
-----   Message from Marin Olp, MD sent at 05/28/2019  6:45 PM EDT ----- Regarding: RE: Dr. Sharol Given- thanks so much!  Team- please call on Monday and have patient worked in with Dr. Sharol Given on Thursday the 13th. May have to move back podiatry visit (and that would be my preference as want Dr. Jess Barters opinion first) if times of appointments overlap.   Thanks, Garret Reddish ----- Message ----- From: Newt Minion, MD Sent: 05/28/2019   6:43 PM EDT To: Marin Olp, MD Subject: RE:                                            I would be happy to see her. I will be back town Wednesday and could see in the office Thursday  just have them call My office and say I said to work them in. I would do a Doppler in my office and evaluate the soft tissue to make a recommendation. Thank you for reaching out   Beverely Low ----- Message ----- From: Marin Olp, MD Sent: 05/28/2019   6:29 PM EDT To: Newt Minion, MD  Dr. Sharol Given,   I have a big uncompensated favor to ask- my apologies in advance. Mrs. Gionfriddo had previously followed with you for diabetic foot ulcers.   Recently, she saw one of my colleagues for a great toe sore on right foot who recommended referral back to you- unfortunately it ended up going to podiatry- patient has seen Dr. March Rummage x2 and he is recommending amputation of distal phalanx right great toe I believe due to concern for osteomyelitis on x-ray. He stated they should consider vascular follow up with Dr. Donnetta Hutching ahead of time due to vascular findings. She also sees cardiology who would have to clear her from CV side.   Patient's family and I really trust your opinion- do you mind looking at Dr. Eleanora Neighbor last notes and x-ray and see if you would have similar recommendation- I'm ok with her moving forward with this but it would put everyone's mind at ease if we had your opinion. The soonest we could get her in with you was the 17th so I wanted to reach out.   Thanks for your consideration,   Garret Reddish- patient's PCP

## 2019-05-31 NOTE — Telephone Encounter (Signed)
Pt has been scheduled for Thursday 13th as Dr. Sharol Given requested. Pt spouse Marcello Moores informed of update. Thomas cancelled podiatry appt. This a.m. no further action needed!

## 2019-06-01 ENCOUNTER — Other Ambulatory Visit: Payer: Self-pay | Admitting: Podiatry

## 2019-06-01 ENCOUNTER — Other Ambulatory Visit: Payer: Self-pay | Admitting: Family Medicine

## 2019-06-02 NOTE — Telephone Encounter (Signed)
Dr. March Rummage please advise

## 2019-06-02 NOTE — Progress Notes (Signed)
Scheduled to see Dr. Sharol Given tomorrow.

## 2019-06-03 ENCOUNTER — Ambulatory Visit (INDEPENDENT_AMBULATORY_CARE_PROVIDER_SITE_OTHER): Payer: Medicare Other | Admitting: Orthopedic Surgery

## 2019-06-03 ENCOUNTER — Encounter: Payer: Self-pay | Admitting: Orthopedic Surgery

## 2019-06-03 ENCOUNTER — Ambulatory Visit: Payer: Medicare Other | Admitting: Podiatry

## 2019-06-03 VITALS — Ht 66.0 in | Wt 112.0 lb

## 2019-06-03 DIAGNOSIS — I739 Peripheral vascular disease, unspecified: Secondary | ICD-10-CM

## 2019-06-03 DIAGNOSIS — M869 Osteomyelitis, unspecified: Secondary | ICD-10-CM

## 2019-06-03 MED ORDER — PENTOXIFYLLINE ER 400 MG PO TBCR: 400.0000 mg | EXTENDED_RELEASE_TABLET | Freq: Three times a day (TID) | ORAL | 3 refills | Status: DC

## 2019-06-03 MED ORDER — NITROGLYCERIN 0.2 MG/HR TD PT24: 0.2000 mg | MEDICATED_PATCH | Freq: Every day | TRANSDERMAL | 12 refills | Status: DC

## 2019-06-03 NOTE — Progress Notes (Signed)
Office Visit Note   Patient: [name]           Date of Birth: February 02, 1931           MRN: [id number] Visit Date: 06/03/2019              Requested by: Marin Olp, MD [place],   [id number] PCP: Marin Olp, MD  Chief Complaint  Patient presents with  . Right Foot - Open Wound      HPI: Patient is an 85 year old woman who presents with ulceration right great toe.  Patient is currently using Bactroban ointment and is on Augmentin.  Patient complains of pain ulceration drainage and swelling.  Assessment & Plan: Visit Diagnoses:  1. Osteomyelitis of great toe of right foot ([place])   2. PVD (peripheral vascular disease) ([place])     Plan: We will plan to start a nitroglycerin patch and Trental to try to help with microcirculation.  With patient's Alzheimer's am not sure she could be compliant with postoperative care and with her diminished circulation healing would also be difficult.  Follow-Up Instructions: Return in about 4 weeks (around 07/01/2019).   Ortho Exam  Patient is alert, oriented, no adenopathy, well-dressed, normal affect, normal respiratory effort. Examination patient's ankle-brachial indices shows adequate circulation in the office the Doppler was used and she does have biphasic dopplerable flow.  The right great toe shows an ischemic ulcer with radiographs showing osteomyelitis on the tuft of the toe.  Patient also has a decubitus heel ulcer which has healed with some callus.  Imaging: No results found.   Labs: Lab Results  Component Value Date   HGBA1C 6.4 (A) 05/25/2019   HGBA1C 6.6 (H) 10/01/2018   HGBA1C 7.2 (A) 05/01/2018   ESRSEDRATE 4 11/17/2014   REPTSTATUS 07/21/2009 FINAL 07/20/2009   REPTSTATUS 07/21/2009 FINAL 07/20/2009   CULT  07/20/2009    Multiple bacterial morphotypes present, none predominant. Suggest appropriate recollection if clinically indicated.   LABORGA ESCHERICHIA COLI 08/13/2016     Lab  Results  Component Value Date   ALBUMIN 3.6 10/01/2018   ALBUMIN 3.4 (L) 05/01/2018   ALBUMIN 3.4 (L) 12/31/2017    No results found for: MG Lab Results  Component Value Date   VD25OH 43.1 12/31/2017    No results found for: PREALBUMIN CBC EXTENDED Latest Ref Rng & Units 05/01/2018 12/31/2017 07/23/2017  WBC 4.0 - 10.5 K/uL 8.5 8.8 8.5  RBC 3.87 - 5.11 Mil/uL 4.20 4.08 4.08  HGB 12.0 - 15.0 g/dL 13.3 12.6 13.1  HCT 36.0 - 46.0 % 39.8 40.8 39.0  PLT 150.0 - 400.0 K/uL 367.0 314 259  NEUTROABS 1.4 - 7.0 x10E3/uL - 6.5 6.3  LYMPHSABS 0.7 - 3.1 x10E3/uL - 1.0 1.0     Body mass index is 18.08 kg/m.  Orders:  No orders of the defined types were placed in this encounter.  Meds ordered this encounter  Medications  . pentoxifylline (TRENTAL) 400 MG CR tablet    Sig: Take 1 tablet (400 mg total) by mouth 3 (three) times daily with meals.    Dispense:  90 tablet    Refill:  3  . nitroGLYCERIN (NITRODUR - DOSED IN MG/24 HR) 0.2 mg/hr patch    Sig: Place 1 patch (0.2 mg total) onto the skin daily.    Dispense:  30 patch    Refill:  12     Procedures: No procedures performed  Clinical Data: No additional findings.  ROS:  All other systems negative, except as noted in the HPI. Review of Systems  Objective: Vital Signs: Ht 5\' 6"  (1.676 m)   Wt 112 lb (50.8 kg)   LMP  (LMP Unknown)   BMI 18.08 kg/m   Specialty Comments:  No specialty comments available.  PMFS History: Patient Active Problem List   Diagnosis Date Noted  . Protein-calorie malnutrition ([place]) 07/21/2018  . Osteoporosis 07/02/2018  . Diabetic ulcer of foot with fat layer exposed ([place]) 05/14/2018  . GERD (gastroesophageal reflux disease) 05/02/2018  . Generalized weakness 05/02/2018  . History of DVT in adulthood 06/26/2017  . LBBB (left bundle branch block) 10/09/2016  . Pleural effusion, left 09/23/2016  . Coronary artery calcification seen on CAT scan 08/28/2016  . Alzheimer's disease ([place])  01/04/2016  . Pancreas cyst 07/02/2015  . Asthma, chronic 03/16/2015  . Primary open angle glaucoma of left eye, moderate stage 12/25/2014  . Pseudophakia of both eyes 02/15/2013  . Anemia 05/25/2012  . Astigmatism 04/29/2012  . Allergic rhinitis 10/30/2010  . Venous (peripheral) insufficiency 04/13/2007  . Diabetes ([place]) 12/18/2006  . Hypertension associated with diabetes ([place]) 12/18/2006  . Atherosclerosis of native arteries of extremity with intermittent claudication ([place]) 12/18/2006  . OSTEOARTHRITIS OF SPINE, NOS 12/18/2006   Past Medical History:  Diagnosis Date  . Alzheimer's dementia without behavioral disturbance ([place])    diagnosed 12/2015; Dr. Jade/neurology  . Anemia   . Asthma   . Blood transfusion 05-24-12  . Blood transfusion without reported diagnosis   . Chronic kidney disease    dr Clover Mealy  . Diabetes mellitus   . Diverticulosis   . DVT (deep venous thrombosis) ([place]) 03/2005   hx.left leg  . Esophageal stricture   . GERD (gastroesophageal reflux disease)   . Glaucoma   . Hiatal hernia 05/07/13  . Hyperlipidemia   . Hypertension   . Osteoarthritis   . Pancreatic duct dilated 05/07/13  . Pulmonary nodules 05/07/13  . PVD (peripheral vascular disease) ([place])   . Tubular adenoma of colon     Family History  Adopted: Yes  Problem Relation Age of Onset  . Migraines Daughter   . Cancer Son   . Colon cancer Neg Hx     Past Surgical History:  Procedure Laterality Date  . CATARACT EXTRACTION, BILATERAL    . COLONOSCOPY  05/27/2012   Procedure: COLONOSCOPY;  Surgeon: Lafayette Dragon, MD;  Location: [facility];  Service: Endoscopy;  Laterality: N/A;  . ESOPHAGOGASTRODUODENOSCOPY  05/26/2012   Procedure: ESOPHAGOGASTRODUODENOSCOPY (EGD);  Surgeon: Lafayette Dragon, MD;  Location: [facility];  Service: Endoscopy;  Laterality: N/A;  . EUS N/A 07/08/2013   Procedure: UPPER ENDOSCOPIC ULTRASOUND (EUS) LINEAR;  Surgeon: Milus Banister, MD;  Location: [facility];  Service:  Endoscopy;  Laterality: N/A;  need side view scope  . EYE SURGERY    . NM MYOVIEW LTD  10/2016   Read as intermediate risk due to EF of 38%.Anemia or infarction noted. Only abnormal septal WM.   Marland Kitchen TRANSTHORACIC ECHOCARDIOGRAM  10/2016    EF 45-50% with possible inferior hypokinesis. Aortic sclerosis without stenosis (ordered to reassess EF from Myoview)  . VASCULAR SURGERY  09/2004  . WRIST FRACTURE SURGERY     R wrist fracture, plate   Social History   Occupational History  . Not on file  Tobacco Use  . Smoking status: Never Smoker  . Smokeless tobacco: Never Used  Substance and Sexual Activity  . Alcohol use: No  . Drug  use: No  . Sexual activity: Not Currently       

## 2019-06-04 ENCOUNTER — Telehealth: Payer: Self-pay | Admitting: Orthopedic Surgery

## 2019-06-04 NOTE — Telephone Encounter (Signed)
Patient's husband left a voicemail message requesting that the medication Dr. Sharol Given prescribed her yesterday be changed from the time release to another medication that can be crushed.  CB#[phone].  Thank you.

## 2019-06-04 NOTE — Telephone Encounter (Signed)
IC advised.  

## 2019-06-04 NOTE — Telephone Encounter (Signed)
See below and advise

## 2019-06-04 NOTE — Telephone Encounter (Signed)
There really is not a substitute, just use the nitroglycerine patch

## 2019-06-04 NOTE — Telephone Encounter (Signed)
IC advised per Dr Sharol Given but wanted to know if recommended a substitution.

## 2019-06-04 NOTE — Telephone Encounter (Signed)
This med only comes in long acting, so just have them not use it

## 2019-06-07 ENCOUNTER — Ambulatory Visit: Payer: Medicare Other | Admitting: Orthopedic Surgery

## 2019-06-07 DIAGNOSIS — E46 Unspecified protein-calorie malnutrition: Secondary | ICD-10-CM | POA: Diagnosis not present

## 2019-06-07 DIAGNOSIS — D649 Anemia, unspecified: Secondary | ICD-10-CM | POA: Diagnosis not present

## 2019-06-07 DIAGNOSIS — F0280 Dementia in other diseases classified elsewhere without behavioral disturbance: Secondary | ICD-10-CM | POA: Diagnosis not present

## 2019-06-07 DIAGNOSIS — M479 Spondylosis, unspecified: Secondary | ICD-10-CM | POA: Diagnosis not present

## 2019-06-07 DIAGNOSIS — L89891 Pressure ulcer of other site, stage 1: Secondary | ICD-10-CM | POA: Diagnosis not present

## 2019-06-07 DIAGNOSIS — G301 Alzheimer's disease with late onset: Secondary | ICD-10-CM | POA: Diagnosis not present

## 2019-06-07 DIAGNOSIS — I10 Essential (primary) hypertension: Secondary | ICD-10-CM | POA: Diagnosis not present

## 2019-06-07 DIAGNOSIS — I447 Left bundle-branch block, unspecified: Secondary | ICD-10-CM | POA: Diagnosis not present

## 2019-06-07 DIAGNOSIS — E1151 Type 2 diabetes mellitus with diabetic peripheral angiopathy without gangrene: Secondary | ICD-10-CM | POA: Diagnosis not present

## 2019-06-07 DIAGNOSIS — M810 Age-related osteoporosis without current pathological fracture: Secondary | ICD-10-CM | POA: Diagnosis not present

## 2019-06-07 DIAGNOSIS — I2510 Atherosclerotic heart disease of native coronary artery without angina pectoris: Secondary | ICD-10-CM | POA: Diagnosis not present

## 2019-06-07 DIAGNOSIS — R1311 Dysphagia, oral phase: Secondary | ICD-10-CM | POA: Diagnosis not present

## 2019-06-07 DIAGNOSIS — L89621 Pressure ulcer of left heel, stage 1: Secondary | ICD-10-CM | POA: Diagnosis not present

## 2019-06-07 DIAGNOSIS — Z7984 Long term (current) use of oral hypoglycemic drugs: Secondary | ICD-10-CM | POA: Diagnosis not present

## 2019-06-07 DIAGNOSIS — J45909 Unspecified asthma, uncomplicated: Secondary | ICD-10-CM | POA: Diagnosis not present

## 2019-06-07 DIAGNOSIS — L89611 Pressure ulcer of right heel, stage 1: Secondary | ICD-10-CM | POA: Diagnosis not present

## 2019-06-07 DIAGNOSIS — M6281 Muscle weakness (generalized): Secondary | ICD-10-CM | POA: Diagnosis not present

## 2019-06-09 ENCOUNTER — Other Ambulatory Visit: Payer: Self-pay | Admitting: Podiatry

## 2019-06-09 ENCOUNTER — Telehealth: Payer: Self-pay

## 2019-06-09 DIAGNOSIS — G301 Alzheimer's disease with late onset: Secondary | ICD-10-CM | POA: Diagnosis not present

## 2019-06-09 DIAGNOSIS — E46 Unspecified protein-calorie malnutrition: Secondary | ICD-10-CM | POA: Diagnosis not present

## 2019-06-09 DIAGNOSIS — F0280 Dementia in other diseases classified elsewhere without behavioral disturbance: Secondary | ICD-10-CM | POA: Diagnosis not present

## 2019-06-09 DIAGNOSIS — I10 Essential (primary) hypertension: Secondary | ICD-10-CM | POA: Diagnosis not present

## 2019-06-09 DIAGNOSIS — E1151 Type 2 diabetes mellitus with diabetic peripheral angiopathy without gangrene: Secondary | ICD-10-CM | POA: Diagnosis not present

## 2019-06-09 DIAGNOSIS — I2510 Atherosclerotic heart disease of native coronary artery without angina pectoris: Secondary | ICD-10-CM | POA: Diagnosis not present

## 2019-06-09 NOTE — Telephone Encounter (Signed)
Copied from [place] [id number]. Topic: General - Other >> Jun 09, 2019 11:43 AM Antonieta Iba C wrote: Reason for CRM: pt's daughter called in to request to have a new attachment to pt's sleep machine. She said that the attachment is cracked. Pt does a breathing treatment.

## 2019-06-11 NOTE — Telephone Encounter (Signed)
Pt/Pt Guardian did not answer. Left message stating the office was calling in regards to the medication refill request and that the office had some questions. Instructed to return call to the office as soon as possible.

## 2019-06-14 ENCOUNTER — Other Ambulatory Visit: Payer: Self-pay | Admitting: Podiatry

## 2019-06-14 NOTE — Telephone Encounter (Signed)
Will need appt prior to refill.

## 2019-06-16 DIAGNOSIS — F0280 Dementia in other diseases classified elsewhere without behavioral disturbance: Secondary | ICD-10-CM | POA: Diagnosis not present

## 2019-06-16 DIAGNOSIS — G301 Alzheimer's disease with late onset: Secondary | ICD-10-CM | POA: Diagnosis not present

## 2019-06-16 DIAGNOSIS — I2510 Atherosclerotic heart disease of native coronary artery without angina pectoris: Secondary | ICD-10-CM | POA: Diagnosis not present

## 2019-06-16 DIAGNOSIS — I10 Essential (primary) hypertension: Secondary | ICD-10-CM | POA: Diagnosis not present

## 2019-06-16 DIAGNOSIS — E1151 Type 2 diabetes mellitus with diabetic peripheral angiopathy without gangrene: Secondary | ICD-10-CM | POA: Diagnosis not present

## 2019-06-16 DIAGNOSIS — E46 Unspecified protein-calorie malnutrition: Secondary | ICD-10-CM | POA: Diagnosis not present

## 2019-06-16 NOTE — Telephone Encounter (Signed)
Called [name] and left VM to call the office.

## 2019-06-17 DIAGNOSIS — H401122 Primary open-angle glaucoma, left eye, moderate stage: Secondary | ICD-10-CM | POA: Diagnosis not present

## 2019-06-17 NOTE — Telephone Encounter (Signed)
Called [name] and informed her to reach out to the pt Asthma specialist Wardell Honour, MD as takes care of this. [name] verbalized understanding. No further action needed!

## 2019-06-19 ENCOUNTER — Other Ambulatory Visit: Payer: Self-pay | Admitting: Family Medicine

## 2019-06-19 ENCOUNTER — Encounter: Payer: Self-pay | Admitting: Orthopedic Surgery

## 2019-06-21 NOTE — Telephone Encounter (Signed)
It may be that this is all the pharmacy has available-if so I am fine with refilling this way

## 2019-06-21 NOTE — Telephone Encounter (Signed)
Previously filled at Famotidine 20 mg 1 tablet BID prn.   OK for change?

## 2019-06-22 DIAGNOSIS — I2510 Atherosclerotic heart disease of native coronary artery without angina pectoris: Secondary | ICD-10-CM | POA: Diagnosis not present

## 2019-06-22 DIAGNOSIS — I10 Essential (primary) hypertension: Secondary | ICD-10-CM | POA: Diagnosis not present

## 2019-06-22 DIAGNOSIS — F0280 Dementia in other diseases classified elsewhere without behavioral disturbance: Secondary | ICD-10-CM | POA: Diagnosis not present

## 2019-06-22 DIAGNOSIS — G301 Alzheimer's disease with late onset: Secondary | ICD-10-CM | POA: Diagnosis not present

## 2019-06-22 DIAGNOSIS — E46 Unspecified protein-calorie malnutrition: Secondary | ICD-10-CM | POA: Diagnosis not present

## 2019-06-22 DIAGNOSIS — E1151 Type 2 diabetes mellitus with diabetic peripheral angiopathy without gangrene: Secondary | ICD-10-CM | POA: Diagnosis not present

## 2019-06-23 ENCOUNTER — Ambulatory Visit: Payer: Medicare Other | Admitting: Podiatry

## 2019-06-24 ENCOUNTER — Telehealth: Payer: Self-pay | Admitting: Family Medicine

## 2019-06-24 NOTE — Telephone Encounter (Signed)
Spoke to [name] and advised her to reach Dr. Meridee Score for answers. Dr. Jess Barters information was given and [name] was advised to call back if anything else was needed.

## 2019-06-24 NOTE — Telephone Encounter (Signed)
Please advise if this message needs to go to Dr.Duda. I see in the note both are being used.

## 2019-06-24 NOTE — Telephone Encounter (Signed)
[name] with [facility] has been applying Medi-honey to the right great toe.  But caregiver states they have been applying mupirocin ointment.  [name] would like to know which one Dr Yong Channel prefers she apply  cb [phone]

## 2019-06-24 NOTE — Telephone Encounter (Signed)
Yes thanks- please have them discuss with Dr. Sharol Given- hes the expert on diabetic wounds

## 2019-06-29 ENCOUNTER — Other Ambulatory Visit: Payer: Self-pay

## 2019-06-29 ENCOUNTER — Telehealth: Payer: Self-pay

## 2019-06-29 MED ORDER — SILVER SULFADIAZINE 1 % EX CREA: 1.0000 "application " | TOPICAL_CREAM | Freq: Every day | CUTANEOUS | 0 refills | Status: DC

## 2019-06-29 NOTE — Telephone Encounter (Signed)
You had suggested the pt use trental and nitro patch for toe ulcer ( not surgical candidate) but waht type of ointment would you like [facility] to use for dressing changes?

## 2019-06-29 NOTE — Telephone Encounter (Signed)
Mo with [facility] would like clarification on what type of ointment to use for patient.  Cb# is [phone].  Please advise.  Thank you.

## 2019-06-29 NOTE — Telephone Encounter (Signed)
Silvadene plus dry dressing

## 2019-06-29 NOTE — Telephone Encounter (Signed)
rx faxed to pharm and called HHn to advise of orders below. Will call with questions.

## 2019-07-01 ENCOUNTER — Ambulatory Visit: Payer: Medicare Other | Admitting: Orthopedic Surgery

## 2019-07-01 DIAGNOSIS — E1151 Type 2 diabetes mellitus with diabetic peripheral angiopathy without gangrene: Secondary | ICD-10-CM | POA: Diagnosis not present

## 2019-07-01 DIAGNOSIS — G301 Alzheimer's disease with late onset: Secondary | ICD-10-CM | POA: Diagnosis not present

## 2019-07-01 DIAGNOSIS — I2510 Atherosclerotic heart disease of native coronary artery without angina pectoris: Secondary | ICD-10-CM | POA: Diagnosis not present

## 2019-07-01 DIAGNOSIS — I10 Essential (primary) hypertension: Secondary | ICD-10-CM | POA: Diagnosis not present

## 2019-07-01 DIAGNOSIS — E46 Unspecified protein-calorie malnutrition: Secondary | ICD-10-CM | POA: Diagnosis not present

## 2019-07-01 DIAGNOSIS — F0280 Dementia in other diseases classified elsewhere without behavioral disturbance: Secondary | ICD-10-CM | POA: Diagnosis not present

## 2019-07-06 DIAGNOSIS — G301 Alzheimer's disease with late onset: Secondary | ICD-10-CM | POA: Diagnosis not present

## 2019-07-06 DIAGNOSIS — E46 Unspecified protein-calorie malnutrition: Secondary | ICD-10-CM | POA: Diagnosis not present

## 2019-07-06 DIAGNOSIS — F0280 Dementia in other diseases classified elsewhere without behavioral disturbance: Secondary | ICD-10-CM | POA: Diagnosis not present

## 2019-07-06 DIAGNOSIS — I10 Essential (primary) hypertension: Secondary | ICD-10-CM | POA: Diagnosis not present

## 2019-07-06 DIAGNOSIS — E1151 Type 2 diabetes mellitus with diabetic peripheral angiopathy without gangrene: Secondary | ICD-10-CM | POA: Diagnosis not present

## 2019-07-06 DIAGNOSIS — I2510 Atherosclerotic heart disease of native coronary artery without angina pectoris: Secondary | ICD-10-CM | POA: Diagnosis not present

## 2019-07-07 DIAGNOSIS — M810 Age-related osteoporosis without current pathological fracture: Secondary | ICD-10-CM | POA: Diagnosis not present

## 2019-07-07 DIAGNOSIS — F0280 Dementia in other diseases classified elsewhere without behavioral disturbance: Secondary | ICD-10-CM | POA: Diagnosis not present

## 2019-07-07 DIAGNOSIS — E1151 Type 2 diabetes mellitus with diabetic peripheral angiopathy without gangrene: Secondary | ICD-10-CM | POA: Diagnosis not present

## 2019-07-07 DIAGNOSIS — E46 Unspecified protein-calorie malnutrition: Secondary | ICD-10-CM | POA: Diagnosis not present

## 2019-07-07 DIAGNOSIS — M6281 Muscle weakness (generalized): Secondary | ICD-10-CM | POA: Diagnosis not present

## 2019-07-07 DIAGNOSIS — R1311 Dysphagia, oral phase: Secondary | ICD-10-CM | POA: Diagnosis not present

## 2019-07-07 DIAGNOSIS — I10 Essential (primary) hypertension: Secondary | ICD-10-CM | POA: Diagnosis not present

## 2019-07-07 DIAGNOSIS — J45909 Unspecified asthma, uncomplicated: Secondary | ICD-10-CM | POA: Diagnosis not present

## 2019-07-07 DIAGNOSIS — L89611 Pressure ulcer of right heel, stage 1: Secondary | ICD-10-CM | POA: Diagnosis not present

## 2019-07-07 DIAGNOSIS — I2510 Atherosclerotic heart disease of native coronary artery without angina pectoris: Secondary | ICD-10-CM | POA: Diagnosis not present

## 2019-07-07 DIAGNOSIS — Z7984 Long term (current) use of oral hypoglycemic drugs: Secondary | ICD-10-CM | POA: Diagnosis not present

## 2019-07-07 DIAGNOSIS — I447 Left bundle-branch block, unspecified: Secondary | ICD-10-CM | POA: Diagnosis not present

## 2019-07-07 DIAGNOSIS — D649 Anemia, unspecified: Secondary | ICD-10-CM | POA: Diagnosis not present

## 2019-07-07 DIAGNOSIS — G301 Alzheimer's disease with late onset: Secondary | ICD-10-CM | POA: Diagnosis not present

## 2019-07-07 DIAGNOSIS — M479 Spondylosis, unspecified: Secondary | ICD-10-CM | POA: Diagnosis not present

## 2019-07-07 DIAGNOSIS — L89891 Pressure ulcer of other site, stage 1: Secondary | ICD-10-CM | POA: Diagnosis not present

## 2019-07-07 DIAGNOSIS — L89621 Pressure ulcer of left heel, stage 1: Secondary | ICD-10-CM | POA: Diagnosis not present

## 2019-07-08 ENCOUNTER — Ambulatory Visit (INDEPENDENT_AMBULATORY_CARE_PROVIDER_SITE_OTHER): Payer: Medicare Other | Admitting: Orthopedic Surgery

## 2019-07-08 ENCOUNTER — Encounter: Payer: Self-pay | Admitting: Orthopedic Surgery

## 2019-07-08 VITALS — Ht 66.0 in | Wt 112.0 lb

## 2019-07-08 DIAGNOSIS — E1151 Type 2 diabetes mellitus with diabetic peripheral angiopathy without gangrene: Secondary | ICD-10-CM | POA: Diagnosis not present

## 2019-07-08 DIAGNOSIS — L89891 Pressure ulcer of other site, stage 1: Secondary | ICD-10-CM | POA: Diagnosis not present

## 2019-07-08 DIAGNOSIS — I2510 Atherosclerotic heart disease of native coronary artery without angina pectoris: Secondary | ICD-10-CM | POA: Diagnosis not present

## 2019-07-08 DIAGNOSIS — M869 Osteomyelitis, unspecified: Secondary | ICD-10-CM

## 2019-07-08 DIAGNOSIS — L89611 Pressure ulcer of right heel, stage 1: Secondary | ICD-10-CM

## 2019-07-08 DIAGNOSIS — R1311 Dysphagia, oral phase: Secondary | ICD-10-CM

## 2019-07-08 DIAGNOSIS — M6281 Muscle weakness (generalized): Secondary | ICD-10-CM

## 2019-07-08 DIAGNOSIS — M810 Age-related osteoporosis without current pathological fracture: Secondary | ICD-10-CM

## 2019-07-08 DIAGNOSIS — G301 Alzheimer's disease with late onset: Secondary | ICD-10-CM | POA: Diagnosis not present

## 2019-07-08 DIAGNOSIS — Z7984 Long term (current) use of oral hypoglycemic drugs: Secondary | ICD-10-CM

## 2019-07-08 DIAGNOSIS — E46 Unspecified protein-calorie malnutrition: Secondary | ICD-10-CM | POA: Diagnosis not present

## 2019-07-08 DIAGNOSIS — M479 Spondylosis, unspecified: Secondary | ICD-10-CM

## 2019-07-08 DIAGNOSIS — J45909 Unspecified asthma, uncomplicated: Secondary | ICD-10-CM | POA: Diagnosis not present

## 2019-07-08 DIAGNOSIS — I447 Left bundle-branch block, unspecified: Secondary | ICD-10-CM

## 2019-07-08 DIAGNOSIS — F0280 Dementia in other diseases classified elsewhere without behavioral disturbance: Secondary | ICD-10-CM | POA: Diagnosis not present

## 2019-07-08 DIAGNOSIS — I10 Essential (primary) hypertension: Secondary | ICD-10-CM | POA: Diagnosis not present

## 2019-07-08 DIAGNOSIS — D649 Anemia, unspecified: Secondary | ICD-10-CM

## 2019-07-08 DIAGNOSIS — L89621 Pressure ulcer of left heel, stage 1: Secondary | ICD-10-CM

## 2019-07-10 ENCOUNTER — Encounter: Payer: Self-pay | Admitting: Orthopedic Surgery

## 2019-07-10 NOTE — Progress Notes (Signed)
Office Visit Note   Patient: [name]           Date of Birth: [birth year]           MRN: [id number] Visit Date: 07/08/2019              Requested by: Marin Olp, MD [place],  [place] [id number] PCP: Marin Olp, MD  Chief Complaint  Patient presents with  . Right Foot - Follow-up    osteomyelitis right great toe      HPI: Patient is a 85 year old woman with Alzheimer's dementia who has had osteomyelitis of the right great toe with ulceration.  She is currently using a nitroglycerin patch and Trental.  Assessment & Plan: Visit Diagnoses:  1. Osteomyelitis of great toe of right foot ([place])     Plan: Patient has shown excellent resolution of the ulcer.  We will have her continue using a nitroglycerin patch until the ulcer has completely resolved and then discontinue the patch.  Follow-Up Instructions: Return if symptoms worsen or fail to improve.   Ortho Exam  Patient is alert, oriented, no adenopathy, well-dressed, normal affect, normal respiratory effort. Examination patient has no cellulitis or swelling of the right great toe.  The ulcer is almost completely healed over the tip of the great toe she has thickened onychomycotic nails that were trimmed she did have a subungual hematoma but no evidence of infection.  Imaging: No results found. No images are attached to the encounter.  Labs: Lab Results  Component Value Date   HGBA1C 6.4 (A) 05/25/2019   HGBA1C 6.6 (H) 10/01/2018   HGBA1C 7.2 (A) 05/01/2018   ESRSEDRATE 4 11/17/2014   REPTSTATUS 07/21/2009 FINAL 07/20/2009   REPTSTATUS 07/21/2009 FINAL 07/20/2009   CULT  07/20/2009    Multiple bacterial morphotypes present, none predominant. Suggest appropriate recollection if clinically indicated.   LABORGA ESCHERICHIA COLI 08/13/2016     Lab Results  Component Value Date   ALBUMIN 3.6 10/01/2018   ALBUMIN 3.4 (L) 05/01/2018   ALBUMIN 3.4 (L) 12/31/2017    No results found for:  MG Lab Results  Component Value Date   VD25OH 43.1 12/31/2017    No results found for: PREALBUMIN CBC EXTENDED Latest Ref Rng & Units 05/01/2018 12/31/2017 07/23/2017  WBC 4.0 - 10.5 K/uL 8.5 8.8 8.5  RBC 3.87 - 5.11 Mil/uL 4.20 4.08 4.08  HGB 12.0 - 15.0 g/dL 13.3 12.6 13.1  HCT 36.0 - 46.0 % 39.8 40.8 39.0  PLT 150.0 - 400.0 K/uL 367.0 314 259  NEUTROABS 1.4 - 7.0 x10E3/uL - 6.5 6.3  LYMPHSABS 0.7 - 3.1 x10E3/uL - 1.0 1.0     Body mass index is 18.08 kg/m.  Orders:  No orders of the defined types were placed in this encounter.  No orders of the defined types were placed in this encounter.    Procedures: No procedures performed  Clinical Data: No additional findings.  ROS:  All other systems negative, except as noted in the HPI. Review of Systems  Objective: Vital Signs: Ht 5\' 6"  (1.676 m)   Wt 112 lb (50.8 kg)   LMP  (LMP Unknown)   BMI 18.08 kg/m   Specialty Comments:  No specialty comments available.  PMFS History: Patient Active Problem List   Diagnosis Date Noted  . Protein-calorie malnutrition ([place]) 07/21/2018  . Osteoporosis 07/02/2018  . Diabetic ulcer of foot with fat layer exposed ([place]) 05/14/2018  . GERD (gastroesophageal reflux disease) 05/02/2018  .  Generalized weakness 05/02/2018  . History of DVT in adulthood 06/26/2017  . LBBB (left bundle branch block) 10/09/2016  . Pleural effusion, left 09/23/2016  . Coronary artery calcification seen on CAT scan 08/28/2016  . Alzheimer's disease ([place]) 01/04/2016  . Pancreas cyst 07/02/2015  . Asthma, chronic 03/16/2015  . Primary open angle glaucoma of left eye, moderate stage 12/25/2014  . Pseudophakia of both eyes 02/15/2013  . Anemia 05/25/2012  . Astigmatism 04/29/2012  . Allergic rhinitis 10/30/2010  . Venous (peripheral) insufficiency 04/13/2007  . Diabetes ([place]) 12/18/2006  . Hypertension associated with diabetes ([place]) 12/18/2006  . Atherosclerosis of native arteries of extremity with  intermittent claudication ([place]) 12/18/2006  . OSTEOARTHRITIS OF SPINE, NOS 12/18/2006   Past Medical History:  Diagnosis Date  . Alzheimer's dementia without behavioral disturbance ([place])    diagnosed 12/2015; Dr. Jade/neurology  . Anemia   . Asthma   . Blood transfusion 05-24-12  . Blood transfusion without reported diagnosis   . Chronic kidney disease    dr Clover Mealy  . Diabetes mellitus   . Diverticulosis   . DVT (deep venous thrombosis) ([place]) 03/2005   hx.left leg  . Esophageal stricture   . GERD (gastroesophageal reflux disease)   . Glaucoma   . Hiatal hernia 05/07/13  . Hyperlipidemia   . Hypertension   . Osteoarthritis   . Pancreatic duct dilated 05/07/13  . Pulmonary nodules 05/07/13  . PVD (peripheral vascular disease) ([place])   . Tubular adenoma of colon     Family History  Adopted: Yes  Problem Relation Age of Onset  . Migraines Daughter   . Cancer Son   . Colon cancer Neg Hx     Past Surgical History:  Procedure Laterality Date  . CATARACT EXTRACTION, BILATERAL    . COLONOSCOPY  05/27/2012   Procedure: COLONOSCOPY;  Surgeon: Lafayette Dragon, MD;  Location: [facility];  Service: Endoscopy;  Laterality: N/A;  . ESOPHAGOGASTRODUODENOSCOPY  05/26/2012   Procedure: ESOPHAGOGASTRODUODENOSCOPY (EGD);  Surgeon: Lafayette Dragon, MD;  Location: [facility];  Service: Endoscopy;  Laterality: N/A;  . EUS N/A 07/08/2013   Procedure: UPPER ENDOSCOPIC ULTRASOUND (EUS) LINEAR;  Surgeon: Milus Banister, MD;  Location: [facility];  Service: Endoscopy;  Laterality: N/A;  need side view scope  . EYE SURGERY    . NM MYOVIEW LTD  10/2016   Read as intermediate risk due to EF of 38%.Anemia or infarction noted. Only abnormal septal WM.   Marland Kitchen TRANSTHORACIC ECHOCARDIOGRAM  10/2016    EF 45-50% with possible inferior hypokinesis. Aortic sclerosis without stenosis (ordered to reassess EF from Myoview)  . VASCULAR SURGERY  09/2004  . WRIST FRACTURE SURGERY     R wrist fracture, plate   Social  History   Occupational History  . Not on file  Tobacco Use  . Smoking status: Never Smoker  . Smokeless tobacco: Never Used  Substance and Sexual Activity  . Alcohol use: No  . Drug use: No  . Sexual activity: Not Currently

## 2019-07-15 DIAGNOSIS — E46 Unspecified protein-calorie malnutrition: Secondary | ICD-10-CM | POA: Diagnosis not present

## 2019-07-15 DIAGNOSIS — E1151 Type 2 diabetes mellitus with diabetic peripheral angiopathy without gangrene: Secondary | ICD-10-CM | POA: Diagnosis not present

## 2019-07-15 DIAGNOSIS — I2510 Atherosclerotic heart disease of native coronary artery without angina pectoris: Secondary | ICD-10-CM | POA: Diagnosis not present

## 2019-07-15 DIAGNOSIS — F0280 Dementia in other diseases classified elsewhere without behavioral disturbance: Secondary | ICD-10-CM | POA: Diagnosis not present

## 2019-07-15 DIAGNOSIS — G301 Alzheimer's disease with late onset: Secondary | ICD-10-CM | POA: Diagnosis not present

## 2019-07-15 DIAGNOSIS — I10 Essential (primary) hypertension: Secondary | ICD-10-CM | POA: Diagnosis not present

## 2019-07-22 ENCOUNTER — Other Ambulatory Visit: Payer: Self-pay

## 2019-07-22 ENCOUNTER — Ambulatory Visit (INDEPENDENT_AMBULATORY_CARE_PROVIDER_SITE_OTHER): Payer: Medicare Other

## 2019-07-22 DIAGNOSIS — Z23 Encounter for immunization: Secondary | ICD-10-CM | POA: Diagnosis not present

## 2019-07-23 DIAGNOSIS — E46 Unspecified protein-calorie malnutrition: Secondary | ICD-10-CM | POA: Diagnosis not present

## 2019-07-23 DIAGNOSIS — I2510 Atherosclerotic heart disease of native coronary artery without angina pectoris: Secondary | ICD-10-CM | POA: Diagnosis not present

## 2019-07-23 DIAGNOSIS — E1151 Type 2 diabetes mellitus with diabetic peripheral angiopathy without gangrene: Secondary | ICD-10-CM | POA: Diagnosis not present

## 2019-07-23 DIAGNOSIS — G301 Alzheimer's disease with late onset: Secondary | ICD-10-CM | POA: Diagnosis not present

## 2019-07-23 DIAGNOSIS — F0280 Dementia in other diseases classified elsewhere without behavioral disturbance: Secondary | ICD-10-CM | POA: Diagnosis not present

## 2019-07-23 DIAGNOSIS — I10 Essential (primary) hypertension: Secondary | ICD-10-CM | POA: Diagnosis not present

## 2019-07-29 DIAGNOSIS — E46 Unspecified protein-calorie malnutrition: Secondary | ICD-10-CM | POA: Diagnosis not present

## 2019-07-29 DIAGNOSIS — G301 Alzheimer's disease with late onset: Secondary | ICD-10-CM | POA: Diagnosis not present

## 2019-07-29 DIAGNOSIS — F0280 Dementia in other diseases classified elsewhere without behavioral disturbance: Secondary | ICD-10-CM | POA: Diagnosis not present

## 2019-07-29 DIAGNOSIS — E1151 Type 2 diabetes mellitus with diabetic peripheral angiopathy without gangrene: Secondary | ICD-10-CM | POA: Diagnosis not present

## 2019-07-29 DIAGNOSIS — I10 Essential (primary) hypertension: Secondary | ICD-10-CM | POA: Diagnosis not present

## 2019-07-29 DIAGNOSIS — I2510 Atherosclerotic heart disease of native coronary artery without angina pectoris: Secondary | ICD-10-CM | POA: Diagnosis not present

## 2019-08-04 ENCOUNTER — Other Ambulatory Visit: Payer: Self-pay | Admitting: Family Medicine

## 2019-08-06 DIAGNOSIS — M6281 Muscle weakness (generalized): Secondary | ICD-10-CM | POA: Diagnosis not present

## 2019-08-06 DIAGNOSIS — Z7984 Long term (current) use of oral hypoglycemic drugs: Secondary | ICD-10-CM | POA: Diagnosis not present

## 2019-08-06 DIAGNOSIS — F0280 Dementia in other diseases classified elsewhere without behavioral disturbance: Secondary | ICD-10-CM | POA: Diagnosis not present

## 2019-08-06 DIAGNOSIS — L89621 Pressure ulcer of left heel, stage 1: Secondary | ICD-10-CM | POA: Diagnosis not present

## 2019-08-06 DIAGNOSIS — I10 Essential (primary) hypertension: Secondary | ICD-10-CM | POA: Diagnosis not present

## 2019-08-06 DIAGNOSIS — J45909 Unspecified asthma, uncomplicated: Secondary | ICD-10-CM | POA: Diagnosis not present

## 2019-08-06 DIAGNOSIS — D649 Anemia, unspecified: Secondary | ICD-10-CM | POA: Diagnosis not present

## 2019-08-06 DIAGNOSIS — G301 Alzheimer's disease with late onset: Secondary | ICD-10-CM | POA: Diagnosis not present

## 2019-08-06 DIAGNOSIS — R1311 Dysphagia, oral phase: Secondary | ICD-10-CM | POA: Diagnosis not present

## 2019-08-06 DIAGNOSIS — I447 Left bundle-branch block, unspecified: Secondary | ICD-10-CM | POA: Diagnosis not present

## 2019-08-06 DIAGNOSIS — E46 Unspecified protein-calorie malnutrition: Secondary | ICD-10-CM | POA: Diagnosis not present

## 2019-08-06 DIAGNOSIS — E1151 Type 2 diabetes mellitus with diabetic peripheral angiopathy without gangrene: Secondary | ICD-10-CM | POA: Diagnosis not present

## 2019-08-06 DIAGNOSIS — I2510 Atherosclerotic heart disease of native coronary artery without angina pectoris: Secondary | ICD-10-CM | POA: Diagnosis not present

## 2019-08-06 DIAGNOSIS — L89891 Pressure ulcer of other site, stage 1: Secondary | ICD-10-CM | POA: Diagnosis not present

## 2019-08-06 DIAGNOSIS — M479 Spondylosis, unspecified: Secondary | ICD-10-CM | POA: Diagnosis not present

## 2019-08-06 DIAGNOSIS — M810 Age-related osteoporosis without current pathological fracture: Secondary | ICD-10-CM | POA: Diagnosis not present

## 2019-08-06 DIAGNOSIS — L89611 Pressure ulcer of right heel, stage 1: Secondary | ICD-10-CM | POA: Diagnosis not present

## 2019-09-24 ENCOUNTER — Other Ambulatory Visit: Payer: Self-pay | Admitting: Family Medicine

## 2019-09-24 NOTE — Telephone Encounter (Signed)
Last OV 05/25/19 Last refill Famotidine 06/22/19 #60/0                 Citalopram 08/04/19 #90/0 Next OV not scheduled

## 2019-10-01 ENCOUNTER — Telehealth: Payer: Self-pay | Admitting: Family Medicine

## 2019-10-01 NOTE — Telephone Encounter (Signed)
See note  Copied from [place] [id number]. Topic: General - Other >> Oct 01, 2019  2:21 PM Oneta Rack wrote: Caller name: Chantel  Relation to pt: [facility] from [facility]  Call back number: [phone]    Reason for call: checking on the status of 08/18/2019 [facility] discharge orders

## 2019-10-04 ENCOUNTER — Other Ambulatory Visit: Payer: Self-pay | Admitting: Neurology

## 2019-10-05 NOTE — Telephone Encounter (Signed)
Returned [name]'s call and lmtcb.

## 2019-10-20 ENCOUNTER — Encounter (HOSPITAL_BASED_OUTPATIENT_CLINIC_OR_DEPARTMENT_OTHER): Payer: Self-pay | Admitting: *Deleted

## 2019-10-20 ENCOUNTER — Other Ambulatory Visit: Payer: Self-pay

## 2019-10-20 ENCOUNTER — Observation Stay (HOSPITAL_BASED_OUTPATIENT_CLINIC_OR_DEPARTMENT_OTHER)
Admission: EM | Admit: 2019-10-20 | Discharge: 2019-10-21 | Disposition: A | Discharge status: Home / Self Care | Payer: Medicare Other | Source: Home / Self Care | Attending: Emergency Medicine | Admitting: Emergency Medicine

## 2019-10-20 ENCOUNTER — Emergency Department (HOSPITAL_BASED_OUTPATIENT_CLINIC_OR_DEPARTMENT_OTHER): Payer: Medicare Other

## 2019-10-20 ENCOUNTER — Telehealth: Payer: Self-pay

## 2019-10-20 DIAGNOSIS — J9601 Acute respiratory failure with hypoxia: Secondary | ICD-10-CM | POA: Diagnosis not present

## 2019-10-20 DIAGNOSIS — J449 Chronic obstructive pulmonary disease, unspecified: Secondary | ICD-10-CM | POA: Insufficient documentation

## 2019-10-20 DIAGNOSIS — G9341 Metabolic encephalopathy: Secondary | ICD-10-CM | POA: Diagnosis not present

## 2019-10-20 DIAGNOSIS — Z7982 Long term (current) use of aspirin: Secondary | ICD-10-CM | POA: Insufficient documentation

## 2019-10-20 DIAGNOSIS — N390 Urinary tract infection, site not specified: Secondary | ICD-10-CM | POA: Diagnosis present

## 2019-10-20 DIAGNOSIS — E860 Dehydration: Secondary | ICD-10-CM

## 2019-10-20 DIAGNOSIS — K219 Gastro-esophageal reflux disease without esophagitis: Secondary | ICD-10-CM | POA: Insufficient documentation

## 2019-10-20 DIAGNOSIS — I129 Hypertensive chronic kidney disease with stage 1 through stage 4 chronic kidney disease, or unspecified chronic kidney disease: Secondary | ICD-10-CM | POA: Insufficient documentation

## 2019-10-20 DIAGNOSIS — J1282 Pneumonia due to coronavirus disease 2019: Secondary | ICD-10-CM | POA: Diagnosis not present

## 2019-10-20 DIAGNOSIS — Z7951 Long term (current) use of inhaled steroids: Secondary | ICD-10-CM | POA: Insufficient documentation

## 2019-10-20 DIAGNOSIS — I2510 Atherosclerotic heart disease of native coronary artery without angina pectoris: Secondary | ICD-10-CM | POA: Insufficient documentation

## 2019-10-20 DIAGNOSIS — G309 Alzheimer's disease, unspecified: Secondary | ICD-10-CM | POA: Insufficient documentation

## 2019-10-20 DIAGNOSIS — M810 Age-related osteoporosis without current pathological fracture: Secondary | ICD-10-CM | POA: Insufficient documentation

## 2019-10-20 DIAGNOSIS — H409 Unspecified glaucoma: Secondary | ICD-10-CM | POA: Insufficient documentation

## 2019-10-20 DIAGNOSIS — R4182 Altered mental status, unspecified: Secondary | ICD-10-CM | POA: Insufficient documentation

## 2019-10-20 DIAGNOSIS — N189 Chronic kidney disease, unspecified: Secondary | ICD-10-CM | POA: Insufficient documentation

## 2019-10-20 DIAGNOSIS — J45909 Unspecified asthma, uncomplicated: Secondary | ICD-10-CM | POA: Insufficient documentation

## 2019-10-20 DIAGNOSIS — Z86010 Personal history of colonic polyps: Secondary | ICD-10-CM | POA: Insufficient documentation

## 2019-10-20 DIAGNOSIS — F0280 Dementia in other diseases classified elsewhere without behavioral disturbance: Secondary | ICD-10-CM | POA: Insufficient documentation

## 2019-10-20 DIAGNOSIS — E1122 Type 2 diabetes mellitus with diabetic chronic kidney disease: Secondary | ICD-10-CM | POA: Insufficient documentation

## 2019-10-20 DIAGNOSIS — Z79890 Hormone replacement therapy (postmenopausal): Secondary | ICD-10-CM | POA: Insufficient documentation

## 2019-10-20 DIAGNOSIS — Z79899 Other long term (current) drug therapy: Secondary | ICD-10-CM | POA: Insufficient documentation

## 2019-10-20 DIAGNOSIS — J440 Chronic obstructive pulmonary disease with acute lower respiratory infection: Secondary | ICD-10-CM | POA: Diagnosis not present

## 2019-10-20 DIAGNOSIS — E785 Hyperlipidemia, unspecified: Secondary | ICD-10-CM | POA: Insufficient documentation

## 2019-10-20 DIAGNOSIS — I10 Essential (primary) hypertension: Secondary | ICD-10-CM | POA: Insufficient documentation

## 2019-10-20 DIAGNOSIS — J90 Pleural effusion, not elsewhere classified: Secondary | ICD-10-CM | POA: Insufficient documentation

## 2019-10-20 DIAGNOSIS — U071 COVID-19: Secondary | ICD-10-CM | POA: Insufficient documentation

## 2019-10-20 DIAGNOSIS — I447 Left bundle-branch block, unspecified: Secondary | ICD-10-CM | POA: Insufficient documentation

## 2019-10-20 DIAGNOSIS — Z86718 Personal history of other venous thrombosis and embolism: Secondary | ICD-10-CM | POA: Insufficient documentation

## 2019-10-20 DIAGNOSIS — D649 Anemia, unspecified: Secondary | ICD-10-CM | POA: Insufficient documentation

## 2019-10-20 DIAGNOSIS — M1990 Unspecified osteoarthritis, unspecified site: Secondary | ICD-10-CM | POA: Insufficient documentation

## 2019-10-20 LAB — URINALYSIS, ROUTINE W REFLEX MICROSCOPIC
Bilirubin Urine: NEGATIVE
Glucose, UA: NEGATIVE mg/dL
Hgb urine dipstick: NEGATIVE
Ketones, ur: NEGATIVE mg/dL
Leukocytes,Ua: NEGATIVE
Nitrite: POSITIVE — AB
Protein, ur: NEGATIVE mg/dL
Specific Gravity, Urine: 1.025 (ref 1.005–1.030)
pH: 6 (ref 5.0–8.0)

## 2019-10-20 LAB — COMPREHENSIVE METABOLIC PANEL
ALT: 24 U/L (ref 0–44)
AST: 31 U/L (ref 15–41)
Albumin: 3.3 g/dL — ABNORMAL LOW (ref 3.5–5.0)
Alkaline Phosphatase: 56 U/L (ref 38–126)
Anion gap: 6 (ref 5–15)
BUN: 24 mg/dL — ABNORMAL HIGH (ref 8–23)
CO2: 27 mmol/L (ref 22–32)
Calcium: 9.7 mg/dL (ref 8.9–10.3)
Chloride: 102 mmol/L (ref 98–111)
Creatinine, Ser: 1.01 mg/dL — ABNORMAL HIGH (ref 0.44–1.00)
GFR calc Af Amer: 58 mL/min — ABNORMAL LOW (ref >60)
GFR calc non Af Amer: 50 mL/min — ABNORMAL LOW (ref >60)
Glucose, Bld: 162 mg/dL — ABNORMAL HIGH (ref 70–99)
Potassium: 4.1 mmol/L (ref 3.5–5.1)
Sodium: 135 mmol/L (ref 135–145)
Total Bilirubin: 0.5 mg/dL (ref 0.3–1.2)
Total Protein: 6.8 g/dL (ref 6.5–8.1)

## 2019-10-20 LAB — CBC WITH DIFFERENTIAL/PLATELET
Abs Immature Granulocytes: 0.04 10*3/uL (ref 0.00–0.07)
Basophils Absolute: 0 10*3/uL (ref 0.0–0.1)
Basophils Relative: 0 %
Eosinophils Absolute: 0 10*3/uL (ref 0.0–0.5)
Eosinophils Relative: 0 %
HCT: 42.5 % (ref 36.0–46.0)
Hemoglobin: 13.9 g/dL (ref 12.0–15.0)
Immature Granulocytes: 1 %
Lymphocytes Relative: 24 %
Lymphs Abs: 2.1 10*3/uL (ref 0.7–4.0)
MCH: 33.2 pg (ref 26.0–34.0)
MCHC: 32.7 g/dL (ref 30.0–36.0)
MCV: 101.4 fL — ABNORMAL HIGH (ref 80.0–100.0)
Monocytes Absolute: 1 10*3/uL (ref 0.1–1.0)
Monocytes Relative: 11 %
Neutro Abs: 5.5 10*3/uL (ref 1.7–7.7)
Neutrophils Relative %: 64 %
Platelets: 234 10*3/uL (ref 150–400)
RBC: 4.19 MIL/uL (ref 3.87–5.11)
RDW: 12.2 % (ref 11.5–15.5)
WBC: 8.7 10*3/uL (ref 4.0–10.5)
nRBC: 0 % (ref 0.0–0.2)

## 2019-10-20 LAB — URINALYSIS, MICROSCOPIC (REFLEX)

## 2019-10-20 LAB — LACTIC ACID, PLASMA: Lactic Acid, Venous: 1.1 mmol/L (ref 0.5–1.9)

## 2019-10-20 LAB — CBG MONITORING, ED: Glucose-Capillary: 103 mg/dL — ABNORMAL HIGH (ref 70–99)

## 2019-10-20 MED ORDER — SODIUM CHLORIDE 0.9 % IV BOLUS
500.0000 mL | Freq: Once | INTRAVENOUS | Status: AC
  Administered 2019-10-20: 500 mL via INTRAVENOUS

## 2019-10-20 MED ORDER — SODIUM CHLORIDE 0.9 % IV SOLN
1.0000 g | Freq: Once | INTRAVENOUS | Status: AC
  Administered 2019-10-20: 19:00:00 1 g via INTRAVENOUS
  Filled 2019-10-20: qty 10

## 2019-10-20 NOTE — Telephone Encounter (Signed)
Agree with ER.  Orma Flaming, MD [place]

## 2019-10-20 NOTE — ED Triage Notes (Signed)
Daughter states altered mental states x 3 days

## 2019-10-20 NOTE — ED Notes (Signed)
Patient transported to CT 

## 2019-10-20 NOTE — ED Notes (Signed)
Provided snacks and juice

## 2019-10-20 NOTE — Telephone Encounter (Signed)
Received call from daughter [name]) mom had been lethargic and not responsive to verbal communication for two days. She has had no fever, cough, changes in bowel or bladder habits. Blood pressure this am was 167/85 with pulse of 61. Blood sugar last night was 208. They have tried to check twice today but continue to get error message.  I advised that they call 911 or go to ED for patient they declined wanted to make an appointment. I informed I would review with provider and call back.   I reviewed with Dr. Rogers Blocker and per her advisement called patient back and recommended ED or 911 as above. Patient family continued to disagree and did not want to take to ED. I asked several times if it was for fear of covid and they declined each time but would not give me a answer as to why. recommended that if they did not want to use [facility] that all major medical systems in the area would have access to her records. Daughter states that they do not feel like she has had stroke that she just needs to go back on her metformin. Advised that if the blood sugars are the cause of her symptoms that they are at a level that needs to be addressed at inpatient setting. Informed her that there are several reasons that could be cause of symptoms but with out further evaluation and studies there is no way to determine cause of symptoms and a primary care office does not have the ability to receive results in a timely manner to  treat her as quickly as ED or hospital. They will contact other family and make a decision.

## 2019-10-20 NOTE — ED Notes (Signed)
Received verbal order to discontinue repeat lactic acid.

## 2019-10-20 NOTE — ED Provider Notes (Signed)
[facility] EMERGENCY DEPARTMENT Provider Note   CSN: [id number] Arrival date & time: 10/20/19  1540     History Chief Complaint  Patient presents with  . Altered Mental Status    [name] is a 85 y.o. female.  Patient is a 85 year old female with history of dementia, chronic kidney disease, diet-controlled diabetes, hypertension and hyperlipidemia who presents with decline in her mental status.  She lives at home with her husband.  At baseline she is confused but communicative and ambulates with assistance.  Per her daughter, over the last 2 days she has not been able to care for herself and has not been communicative.  She has been more lethargic and more confused.  They have not noted any fevers.  She had a little bit of chest congestion.  No vomiting or diarrhea.  No recent falls or head trauma.  No change in her urination or urine output.  History is limited due to patient's dementia and mental status decline.        Past Medical History:  Diagnosis Date  . Alzheimer's dementia without behavioral disturbance ([place])    diagnosed 12/2015; Dr. Jade/neurology  . Anemia   . Asthma   . Blood transfusion 05-24-12  . Blood transfusion without reported diagnosis   . Chronic kidney disease    dr Clover Mealy  . Diabetes mellitus   . Diverticulosis   . DVT (deep venous thrombosis) ([place]) 03/2005   hx.left leg  . Esophageal stricture   . GERD (gastroesophageal reflux disease)   . Glaucoma   . Hiatal hernia 05/07/13  . Hyperlipidemia   . Hypertension   . Osteoarthritis   . Pancreatic duct dilated 05/07/13  . Pulmonary nodules 05/07/13  . PVD (peripheral vascular disease) ([place])   . Tubular adenoma of colon     Patient Active Problem List   Diagnosis Date Noted  . UTI (urinary tract infection) 10/20/2019  . Protein-calorie malnutrition ([place]) 07/21/2018  . Osteoporosis 07/02/2018  . Diabetic ulcer of foot with fat layer exposed ([place]) 05/14/2018  . GERD  (gastroesophageal reflux disease) 05/02/2018  . Generalized weakness 05/02/2018  . History of DVT in adulthood 06/26/2017  . LBBB (left bundle branch block) 10/09/2016  . Pleural effusion, left 09/23/2016  . Coronary artery calcification seen on CAT scan 08/28/2016  . Alzheimer's disease ([place]) 01/04/2016  . Pancreas cyst 07/02/2015  . Asthma, chronic 03/16/2015  . Primary open angle glaucoma of left eye, moderate stage 12/25/2014  . Pseudophakia of both eyes 02/15/2013  . Anemia 05/25/2012  . Astigmatism 04/29/2012  . Allergic rhinitis 10/30/2010  . Venous (peripheral) insufficiency 04/13/2007  . Diabetes ([place]) 12/18/2006  . Hypertension associated with diabetes ([place]) 12/18/2006  . Atherosclerosis of native arteries of extremity with intermittent claudication ([place]) 12/18/2006  . OSTEOARTHRITIS OF SPINE, NOS 12/18/2006    Past Surgical History:  Procedure Laterality Date  . CATARACT EXTRACTION, BILATERAL    . COLONOSCOPY  05/27/2012   Procedure: COLONOSCOPY;  Surgeon: Lafayette Dragon, MD;  Location: [facility] ENDOSCOPY;  Service: Endoscopy;  Laterality: N/A;  . ESOPHAGOGASTRODUODENOSCOPY  05/26/2012   Procedure: ESOPHAGOGASTRODUODENOSCOPY (EGD);  Surgeon: Lafayette Dragon, MD;  Location: [facility];  Service: Endoscopy;  Laterality: N/A;  . EUS N/A 07/08/2013   Procedure: UPPER ENDOSCOPIC ULTRASOUND (EUS) LINEAR;  Surgeon: Milus Banister, MD;  Location: [facility] ENDOSCOPY;  Service: Endoscopy;  Laterality: N/A;  need side view scope  . EYE SURGERY    . NM MYOVIEW LTD  10/2016  Read as intermediate risk due to EF of 38%.Anemia or infarction noted. Only abnormal septal WM.   Marland Kitchen TRANSTHORACIC ECHOCARDIOGRAM  10/2016    EF 45-50% with possible inferior hypokinesis. Aortic sclerosis without stenosis (ordered to reassess EF from Myoview)  . VASCULAR SURGERY  09/2004  . WRIST FRACTURE SURGERY     R wrist fracture, plate     OB History   No obstetric history on file.     Family History  Adopted: Yes   Problem Relation Age of Onset  . Migraines Daughter   . Cancer Son   . Colon cancer Neg Hx     Social History   Tobacco Use  . Smoking status: Never Smoker  . Smokeless tobacco: Never Used  Substance Use Topics  . Alcohol use: No  . Drug use: No    Home Medications Prior to Admission medications   Medication Sig Start Date End Date Taking? Authorizing Provider  albuterol (PROAIR HFA) 108 (90 Base) MCG/ACT inhaler 2 PUFFS EVERY 6 HOURS AS NEEDED FOR SHORTNESS OF BREATH. 12/18/18   Marin Olp, MD  albuterol (PROVENTIL) (2.5 MG/3ML) 0.083% nebulizer solution USE THREE MILLILITERS VIA NEBULIZATION BY MOUTH EVERY 6 HOURS AS NEEDED FOR WHEEZING OR SHORTNESS OF BREATH 12/08/18   Marin Olp, MD  amoxicillin-clavulanate (AUGMENTIN) 400-57 MG/5ML suspension TAKE 7.5 MILLILITERS TWICE DAILY. 06/02/19   Evelina Bucy, DPM  Ascorbic Acid (VITAMIN C) 1000 MG tablet Take 1,000 mg by mouth daily.    [provider]  aspirin 81 MG tablet Take 81 mg by mouth daily.    [provider]  azelastine (ASTELIN) 0.1 % nasal spray Place 1 spray into both nostrils 2 (two) times daily. Use in each nostril as directed 05/01/18   Marin Olp, MD  brimonidine (ALPHAGAN P) 0.1 % SOLN INSTILL 1 DROP IN BOTH EYE TWICE DAILY. 04/24/16   [provider]  budesonide (PULMICORT) 0.25 MG/2ML nebulizer solution Take 2 mLs (0.25 mg total) by nebulization daily. ICD10 Code-J45.909 12/08/18   Marin Olp, MD  calcium carbonate (OS-CAL) 600 MG TABS Take 600 mg by mouth 2 (two) times daily with a meal.    [provider]  citalopram (CELEXA) 10 MG tablet TAKE 1 TABLET ONCE DAILY. 09/24/19   Marin Olp, MD  donepezil (ARICEPT) 10 MG tablet TAKE ONE TABLET AT BEDTIME. 06/01/19   Marin Olp, MD  famotidine (PEPCID) 20 MG tablet TAKE ONE TABLET TWICE A DAY AS NEEDED FOR HEARTBURN OR INDIGESTION. 09/24/19   Marin Olp, MD  IRON PO Take 1 day or 1 dose by  mouth.    [provider]  levothyroxine (SYNTHROID, LEVOTHROID) 25 MCG tablet Take by mouth. 02/16/15   [provider]  memantine (NAMENDA) 10 MG tablet TAKE 1 TABLET TWICE DAILY FOR MEMORY. 10/04/19   Pieter Partridge, DO  Multiple Vitamin (MULTIVITAMIN WITH MINERALS) TABS tablet Take 1 tablet by mouth daily.    [provider]  mupirocin ointment (BACTROBAN) 2 % Apply 1 application topically 2 (two) times daily. Apply to the affected area 2 times a day 07/14/18   Newt Minion, MD  nitroGLYCERIN (NITRODUR - DOSED IN MG/24 HR) 0.2 mg/hr patch Place 1 patch (0.2 mg total) onto the skin daily. 11/17/18   Newt Minion, MD  nitroGLYCERIN (NITRODUR - DOSED IN MG/24 HR) 0.2 mg/hr patch Place 1 patch (0.2 mg total) onto the skin daily. 06/03/19   Newt Minion, MD  pentoxifylline (TRENTAL) 400 MG CR tablet Take 1 tablet (400 mg total) by mouth 3 (three) times daily with meals. 06/03/19   Newt Minion, MD  silver sulfADIAZINE (SILVADENE) 1 % cream Apply 1 application topically daily. 06/29/19   Newt Minion, MD  Wound Dressings (MEDIHONEY WOUND/BURN DRESSING) GEL Apply to affected are 3 times a week, and cover with sterile dressing. 05/25/19   Evelina Bucy, DPM    Allergies    Patient has no known allergies.  Review of Systems   Review of Systems  Unable to perform ROS: Mental status change    Physical Exam Updated Vital Signs BP (!) 174/75 (BP Location: Right Arm)   Pulse (!) 56   Temp 98.1 F (36.7 C) (Rectal)   Resp 18   LMP  (LMP Unknown)   SpO2 93%   Physical Exam Constitutional:      Appearance: She is well-developed.     Comments: Patient is lying on the bed with her eyes closed.  When I ask her what her name if she can tell me her name, she says no but would not answer other questions.  HENT:     Head: Normocephalic and atraumatic.     Mouth/Throat:     Comments: Slightly dry mucous membranes Eyes:     Pupils: Pupils are equal, round, and reactive  to light.  Cardiovascular:     Rate and Rhythm: Normal rate and regular rhythm.     Heart sounds: Normal heart sounds.  Pulmonary:     Effort: Pulmonary effort is normal. No respiratory distress.     Breath sounds: Normal breath sounds. No wheezing or rales.  Chest:     Chest wall: No tenderness.  Abdominal:     General: Bowel sounds are normal.     Palpations: Abdomen is soft.     Tenderness: There is no abdominal tenderness. There is no guarding or rebound.  Musculoskeletal:        General: Normal range of motion.     Cervical back: Normal range of motion and neck supple.  Lymphadenopathy:     Cervical: No cervical adenopathy.  Skin:    General: Skin is warm and dry.     Findings: No rash.  Neurological:     General: No focal deficit present.     Mental Status: She is disoriented.     ED Results / Procedures / Treatments   Labs (all labs ordered are listed, but only abnormal results are displayed) Labs Reviewed  CBC WITH DIFFERENTIAL/PLATELET - Abnormal; Notable for the following components:      Result Value   MCV 101.4 (*)    All other components within normal limits  COMPREHENSIVE METABOLIC PANEL - Abnormal; Notable for the following components:   Glucose, Bld 162 (*)    BUN 24 (*)    Creatinine, Ser 1.01 (*)    Albumin 3.3 (*)    GFR calc non Af Amer 50 (*)    GFR calc Af Amer 58 (*)    All other components within normal limits  URINALYSIS, ROUTINE W REFLEX MICROSCOPIC - Abnormal; Notable for the following components:   Nitrite POSITIVE (*)    All other components within normal limits  URINALYSIS, MICROSCOPIC (REFLEX) - Abnormal; Notable for the following components:   Bacteria, UA MANY (*)    All other components within normal limits  URINE CULTURE  CULTURE, BLOOD (ROUTINE X 2)  CULTURE, BLOOD (ROUTINE X 2)  SARS CORONAVIRUS 2 (  TAT 6-24 HRS)  LACTIC ACID, PLASMA    EKG EKG Interpretation  Date/Time:  Wednesday October 20 2019 15:53:35  EST Ventricular Rate:  62 PR Interval:    QRS Duration: 132 QT Interval:  429 QTC Calculation: 436 R Axis:   40 Text Interpretation: Sinus rhythm Left bundle branch block since last tracing no significant change Confirmed by Malvin Johns [phone]) on 10/20/2019 6:33:04 PM   Radiology DG Chest 2 View  Result Date: 10/20/2019 CLINICAL DATA:  Altered mental status. EXAM: CHEST - 2 VIEW COMPARISON:  07/21/2018 FINDINGS: The cardiomediastinal silhouette is unchanged with borderline cardiomegaly. The lungs remain hyperinflated with chronic interstitial coarsening. Mild asymmetric opacities at the left lung base are similar to the prior study and likely reflect scarring. There is a persistent small left pleural effusion. No acute airspace consolidation, edema, or pneumothorax is identified. No acute osseous abnormality is seen. IMPRESSION: COPD with an unchanged small left pleural effusion. No evidence of acute airspace disease. Electronically Signed   By: Logan Bores M.D.   On: 10/20/2019 16:58   CT Head Wo Contrast  Result Date: 10/20/2019 CLINICAL DATA:  Altered mental status. EXAM: CT HEAD WITHOUT CONTRAST TECHNIQUE: Contiguous axial images were obtained from the base of the skull through the vertex without intravenous contrast. COMPARISON:  CT head dated February 11, 2019. FINDINGS: Brain: No evidence of acute infarction, hemorrhage, hydrocephalus, extra-axial collection or mass lesion/mass effect. Stable atrophy and chronic microvascular ischemic changes. Vascular: Calcified atherosclerosis at the skullbase. No hyperdense vessel. Skull: Normal. Negative for fracture or focal lesion. Sinuses/Orbits: No acute finding. Other: None. IMPRESSION: 1.  No acute intracranial abnormality. Electronically Signed   By: Titus Dubin M.D.   On: 10/20/2019 16:49    Procedures Procedures (including critical care time)  Medications Ordered in ED Medications  sodium chloride 0.9 % bolus 500 mL ( Intravenous  Stopped 10/20/19 1824)  cefTRIAXone (ROCEPHIN) 1 g in sodium chloride 0.9 % 100 mL IVPB ( Intravenous Stopped 10/20/19 1921)    ED Course  I have reviewed the triage vital signs and the nursing notes.  Pertinent labs & imaging results that were available during my care of the patient were reviewed by me and considered in my medical decision making (see chart for details).    MDM Rules/Calculators/A&P                     Patient is a 85 year old female who presents with increased confusion, weakness and not eating.  Her work-up reveals evidence of a urinary tract infection.  Her labs show mild elevation in her creatinine and BUN consistent with probable dehydration.  Her other labs are nonconcerning.  There is no suggestions of sepsis.  Her head CT shows no acute abnormality.  There is no focal neurologic deficits.  She was started on IV Rocephin.  Given her significant decline, I had a discussion with the family and it was decided that we will go ahead and admit the patient to the hospital.  I spoke with Dr. Marlowe Sax who has accepted the patient for admission to [facility]. Final Clinical Impression(s) / ED Diagnoses Final diagnoses:  Altered mental status, unspecified altered mental status type  Lower urinary tract infectious disease  Dehydration    Rx / DC Orders ED Discharge Orders    None       Malvin Johns, MD 10/20/19 2132

## 2019-10-20 NOTE — ED Notes (Signed)
Received report

## 2019-10-21 LAB — CBG MONITORING, ED: Glucose-Capillary: 103 mg/dL — ABNORMAL HIGH (ref 70–99)

## 2019-10-21 LAB — SARS CORONAVIRUS 2 (TAT 6-24 HRS): SARS Coronavirus 2: POSITIVE — AB

## 2019-10-21 LAB — URINE CULTURE

## 2019-10-21 MED ORDER — MEMANTINE HCL 10 MG PO TABS
10.0000 mg | ORAL_TABLET | Freq: Two times a day (BID) | ORAL | Status: DC
  Filled 2019-10-21: qty 1

## 2019-10-21 MED ORDER — ASPIRIN EC 81 MG PO TBEC: 81.0000 mg | DELAYED_RELEASE_TABLET | Freq: Every day | ORAL | Status: DC

## 2019-10-21 MED ORDER — SODIUM CHLORIDE 0.9 % IV SOLN: 1.0000 g | INTRAVENOUS | Status: DC

## 2019-10-21 MED ORDER — LEVOTHYROXINE SODIUM 25 MCG PO TABS: 25.0000 ug | ORAL_TABLET | Freq: Every day | ORAL | Status: DC

## 2019-10-21 MED ORDER — CEFDINIR 300 MG PO CAPS: 300.0000 mg | ORAL_CAPSULE | Freq: Two times a day (BID) | ORAL | 0 refills | Status: DC

## 2019-10-21 MED ORDER — DONEPEZIL HCL 10 MG PO TABS
10.0000 mg | ORAL_TABLET | Freq: Every day | ORAL | Status: DC
  Filled 2019-10-21: qty 1

## 2019-10-21 MED ORDER — PENTOXIFYLLINE ER 400 MG PO TBCR
400.0000 mg | EXTENDED_RELEASE_TABLET | Freq: Three times a day (TID) | ORAL | Status: DC
  Filled 2019-10-21: qty 1

## 2019-10-21 MED ORDER — BRIMONIDINE TARTRATE 0.15 % OP SOLN
1.0000 [drp] | Freq: Two times a day (BID) | OPHTHALMIC | Status: DC
  Filled 2019-10-21: qty 5

## 2019-10-21 MED ORDER — ALBUTEROL SULFATE HFA 108 (90 BASE) MCG/ACT IN AERS: 2.0000 | INHALATION_SPRAY | RESPIRATORY_TRACT | Status: DC | PRN

## 2019-10-21 MED ORDER — CITALOPRAM HYDROBROMIDE 10 MG PO TABS
10.0000 mg | ORAL_TABLET | Freq: Every day | ORAL | Status: DC
  Filled 2019-10-21: qty 1

## 2019-10-21 NOTE — ED Notes (Signed)
Notified Raquel Sarna at [facility] that patient has COVID. Pt's daughter at bedside notified as well.

## 2019-10-21 NOTE — Discharge Instructions (Addendum)
Be sure to take the antibiotics as instructed until completed or otherwise instructed by your doctor.  It is important to follow-up closely with your physician.  If at any point the mental status seems to be worse, she has vomiting, high fever, trouble breathing, or any other new/concerning symptoms then return to the ER for evaluation.

## 2019-10-21 NOTE — ED Notes (Signed)
Secure message sent to attending provider regarding COVID meds and daily med orders.

## 2019-10-21 NOTE — ED Notes (Signed)
Consulted Dr. Florina Ou for daily meds

## 2019-10-21 NOTE — ED Notes (Signed)
Consulted Dr. Florina Ou EDP regarding remdesivir and decadron protocol - per Dr. Florina Ou, pt does not need these medications at this time as she does not have respiratory symptoms. Still pending bed assignment.

## 2019-10-21 NOTE — ED Notes (Signed)
Went in to meet pt and family member.  Pt is resting comfortably and family member at the bedside.  Family member states that given given the no visitation policy and her mothers condition she would like to take her mother home to care for her there.  EDP notified.

## 2019-10-21 NOTE — ED Notes (Signed)
[facility] at bedside 

## 2019-10-21 NOTE — ED Notes (Signed)
Pt is talking and more alert at this time. Remains confused. Increased ability to follow commands.

## 2019-10-21 NOTE — ED Notes (Addendum)
Reviewed discharge paperwork and explained about antibiotics.  Helped family get pt dressed and out to the car.  IV removed.  Pt appears in no distress, still very weak and requires a 2+ person assist to get moved to [facility] and dressed.

## 2019-10-21 NOTE — ED Notes (Signed)
Critical lab result received; positive for covid.

## 2019-10-21 NOTE — ED Notes (Signed)
Consulted Dr. Florina Ou at patient's family request to see how often IV antibiotics should be given. See new orders.

## 2019-10-21 NOTE — ED Provider Notes (Addendum)
Patient is currently boarding in the emergency department awaiting admission.  Daughter asks if she can take patient home.  She has been here for about 16 hours and received IV Rocephin once.  Her labs are not too bad, certainly no signs of any type of organ failure.  They feel they can care for her well at home.  Part of this seems to be driven by the fact that she has been tested positive for the novel coronavirus and this would limit visitation in the hospital.  However given they report some improvement in mental status, I think it is reasonable to try treating her as an outpatient otherwise.  We discussed strict return precautions.  She will be given 10 days of antibiotics.   Sherwood Gambler, MD 10/21/19 [phone]  [name] was evaluated in Emergency Department on 10/21/2019 for the symptoms described in the history of present illness. She was evaluated in the context of the global COVID-19 pandemic, which necessitated consideration that the patient might be at risk for infection with the SARS-CoV-2 virus that causes COVID-19. Institutional protocols and algorithms that pertain to the evaluation of patients at risk for COVID-19 are in a state of rapid change based on information released by regulatory bodies including the CDC and federal and state organizations. These policies and algorithms were followed during the patient's care in the ED.     Sherwood Gambler, MD 10/21/19 [phone]  Of note, from a covid perspective she appears to be asymptomatic. Sats >92% while asleep. Does not appear to need oxygen or other acute covid treatments.    Sherwood Gambler, MD 10/21/19 [phone]

## 2019-10-21 NOTE — ED Notes (Signed)
Dr. Marlowe Sax notified of COVID status.

## 2019-10-23 ENCOUNTER — Encounter (HOSPITAL_COMMUNITY): Payer: Self-pay

## 2019-10-23 ENCOUNTER — Inpatient Hospital Stay (HOSPITAL_COMMUNITY)
Admission: EM | Admit: 2019-10-23 | Discharge: 2019-10-26 | DRG: 177 | Disposition: A | Discharge status: Home Health | Payer: Medicare Other | Attending: Internal Medicine | Admitting: Internal Medicine

## 2019-10-23 ENCOUNTER — Other Ambulatory Visit: Payer: Self-pay

## 2019-10-23 ENCOUNTER — Emergency Department (HOSPITAL_COMMUNITY): Payer: Medicare Other

## 2019-10-23 DIAGNOSIS — R400 Somnolence: Secondary | ICD-10-CM | POA: Diagnosis not present

## 2019-10-23 DIAGNOSIS — J90 Pleural effusion, not elsewhere classified: Secondary | ICD-10-CM | POA: Diagnosis present

## 2019-10-23 DIAGNOSIS — N390 Urinary tract infection, site not specified: Secondary | ICD-10-CM | POA: Diagnosis present

## 2019-10-23 DIAGNOSIS — G9341 Metabolic encephalopathy: Secondary | ICD-10-CM | POA: Diagnosis present

## 2019-10-23 DIAGNOSIS — K219 Gastro-esophageal reflux disease without esophagitis: Secondary | ICD-10-CM | POA: Diagnosis present

## 2019-10-23 DIAGNOSIS — F0280 Dementia in other diseases classified elsewhere without behavioral disturbance: Secondary | ICD-10-CM | POA: Diagnosis present

## 2019-10-23 DIAGNOSIS — D649 Anemia, unspecified: Secondary | ICD-10-CM | POA: Diagnosis present

## 2019-10-23 DIAGNOSIS — E11621 Type 2 diabetes mellitus with foot ulcer: Secondary | ICD-10-CM

## 2019-10-23 DIAGNOSIS — R001 Bradycardia, unspecified: Secondary | ICD-10-CM | POA: Diagnosis not present

## 2019-10-23 DIAGNOSIS — G309 Alzheimer's disease, unspecified: Secondary | ICD-10-CM | POA: Diagnosis present

## 2019-10-23 DIAGNOSIS — Z79899 Other long term (current) drug therapy: Secondary | ICD-10-CM

## 2019-10-23 DIAGNOSIS — E875 Hyperkalemia: Secondary | ICD-10-CM | POA: Diagnosis present

## 2019-10-23 DIAGNOSIS — I10 Essential (primary) hypertension: Secondary | ICD-10-CM | POA: Diagnosis not present

## 2019-10-23 DIAGNOSIS — Z79890 Hormone replacement therapy (postmenopausal): Secondary | ICD-10-CM

## 2019-10-23 DIAGNOSIS — J9601 Acute respiratory failure with hypoxia: Secondary | ICD-10-CM

## 2019-10-23 DIAGNOSIS — H409 Unspecified glaucoma: Secondary | ICD-10-CM | POA: Diagnosis present

## 2019-10-23 DIAGNOSIS — Z87448 Personal history of other diseases of urinary system: Secondary | ICD-10-CM

## 2019-10-23 DIAGNOSIS — J449 Chronic obstructive pulmonary disease, unspecified: Secondary | ICD-10-CM | POA: Diagnosis present

## 2019-10-23 DIAGNOSIS — Z86718 Personal history of other venous thrombosis and embolism: Secondary | ICD-10-CM

## 2019-10-23 DIAGNOSIS — E785 Hyperlipidemia, unspecified: Secondary | ICD-10-CM | POA: Diagnosis present

## 2019-10-23 DIAGNOSIS — Z7982 Long term (current) use of aspirin: Secondary | ICD-10-CM | POA: Diagnosis not present

## 2019-10-23 DIAGNOSIS — J1282 Pneumonia due to coronavirus disease 2019: Secondary | ICD-10-CM | POA: Diagnosis present

## 2019-10-23 DIAGNOSIS — I447 Left bundle-branch block, unspecified: Secondary | ICD-10-CM | POA: Diagnosis present

## 2019-10-23 DIAGNOSIS — M1990 Unspecified osteoarthritis, unspecified site: Secondary | ICD-10-CM | POA: Diagnosis present

## 2019-10-23 DIAGNOSIS — U071 COVID-19: Secondary | ICD-10-CM | POA: Diagnosis not present

## 2019-10-23 DIAGNOSIS — J4540 Moderate persistent asthma, uncomplicated: Secondary | ICD-10-CM

## 2019-10-23 DIAGNOSIS — L97509 Non-pressure chronic ulcer of other part of unspecified foot with unspecified severity: Secondary | ICD-10-CM | POA: Diagnosis not present

## 2019-10-23 DIAGNOSIS — R4182 Altered mental status, unspecified: Secondary | ICD-10-CM | POA: Diagnosis present

## 2019-10-23 DIAGNOSIS — Z86010 Personal history of colonic polyps: Secondary | ICD-10-CM

## 2019-10-23 DIAGNOSIS — R0902 Hypoxemia: Secondary | ICD-10-CM | POA: Diagnosis not present

## 2019-10-23 DIAGNOSIS — J440 Chronic obstructive pulmonary disease with acute lower respiratory infection: Secondary | ICD-10-CM | POA: Diagnosis present

## 2019-10-23 DIAGNOSIS — E860 Dehydration: Secondary | ICD-10-CM | POA: Diagnosis present

## 2019-10-23 DIAGNOSIS — I2510 Atherosclerotic heart disease of native coronary artery without angina pectoris: Secondary | ICD-10-CM | POA: Diagnosis present

## 2019-10-23 DIAGNOSIS — Z7951 Long term (current) use of inhaled steroids: Secondary | ICD-10-CM | POA: Diagnosis not present

## 2019-10-23 DIAGNOSIS — E1151 Type 2 diabetes mellitus with diabetic peripheral angiopathy without gangrene: Secondary | ICD-10-CM | POA: Diagnosis present

## 2019-10-23 DIAGNOSIS — R404 Transient alteration of awareness: Secondary | ICD-10-CM | POA: Diagnosis not present

## 2019-10-23 DIAGNOSIS — E1122 Type 2 diabetes mellitus with diabetic chronic kidney disease: Secondary | ICD-10-CM | POA: Diagnosis present

## 2019-10-23 DIAGNOSIS — M810 Age-related osteoporosis without current pathological fracture: Secondary | ICD-10-CM | POA: Diagnosis present

## 2019-10-23 DIAGNOSIS — J45909 Unspecified asthma, uncomplicated: Secondary | ICD-10-CM | POA: Diagnosis present

## 2019-10-23 DIAGNOSIS — I129 Hypertensive chronic kidney disease with stage 1 through stage 4 chronic kidney disease, or unspecified chronic kidney disease: Secondary | ICD-10-CM | POA: Diagnosis present

## 2019-10-23 DIAGNOSIS — N189 Chronic kidney disease, unspecified: Secondary | ICD-10-CM | POA: Diagnosis present

## 2019-10-23 DIAGNOSIS — E119 Type 2 diabetes mellitus without complications: Secondary | ICD-10-CM

## 2019-10-23 LAB — COMPREHENSIVE METABOLIC PANEL
ALT: 23 U/L (ref 0–44)
AST: 33 U/L (ref 15–41)
Albumin: 3.3 g/dL — ABNORMAL LOW (ref 3.5–5.0)
Alkaline Phosphatase: 53 U/L (ref 38–126)
Anion gap: 8 (ref 5–15)
BUN: 19 mg/dL (ref 8–23)
CO2: 30 mmol/L (ref 22–32)
Calcium: 9.3 mg/dL (ref 8.9–10.3)
Chloride: 105 mmol/L (ref 98–111)
Creatinine, Ser: 0.81 mg/dL (ref 0.44–1.00)
GFR calc Af Amer: >60 mL/min (ref >60)
GFR calc non Af Amer: >60 mL/min (ref >60)
Glucose, Bld: 144 mg/dL — ABNORMAL HIGH (ref 70–99)
Potassium: 3.7 mmol/L (ref 3.5–5.1)
Sodium: 143 mmol/L (ref 135–145)
Total Bilirubin: 0.5 mg/dL (ref 0.3–1.2)
Total Protein: 6.8 g/dL (ref 6.5–8.1)

## 2019-10-23 LAB — CBC WITH DIFFERENTIAL/PLATELET
Abs Immature Granulocytes: 0.02 10*3/uL (ref 0.00–0.07)
Basophils Absolute: 0 10*3/uL (ref 0.0–0.1)
Basophils Relative: 0 %
Eosinophils Absolute: 0 10*3/uL (ref 0.0–0.5)
Eosinophils Relative: 1 %
HCT: 41.7 % (ref 36.0–46.0)
Hemoglobin: 13.3 g/dL (ref 12.0–15.0)
Immature Granulocytes: 0 %
Lymphocytes Relative: 30 %
Lymphs Abs: 1.8 10*3/uL (ref 0.7–4.0)
MCH: 32.9 pg (ref 26.0–34.0)
MCHC: 31.9 g/dL (ref 30.0–36.0)
MCV: 103.2 fL — ABNORMAL HIGH (ref 80.0–100.0)
Monocytes Absolute: 0.6 10*3/uL (ref 0.1–1.0)
Monocytes Relative: 10 %
Neutro Abs: 3.6 10*3/uL (ref 1.7–7.7)
Neutrophils Relative %: 59 %
Platelets: 178 10*3/uL (ref 150–400)
RBC: 4.04 MIL/uL (ref 3.87–5.11)
RDW: 12.1 % (ref 11.5–15.5)
WBC: 6.1 10*3/uL (ref 4.0–10.5)
nRBC: 0 % (ref 0.0–0.2)

## 2019-10-23 LAB — D-DIMER, QUANTITATIVE: D-Dimer, Quant: 3.9 ug/mL-FEU — ABNORMAL HIGH (ref 0.00–0.50)

## 2019-10-23 LAB — TRIGLYCERIDES: Triglycerides: 87 mg/dL (ref <150)

## 2019-10-23 LAB — C-REACTIVE PROTEIN: CRP: 2 mg/dL — ABNORMAL HIGH (ref <1.0)

## 2019-10-23 LAB — FERRITIN: Ferritin: 592 ng/mL — ABNORMAL HIGH (ref 11–307)

## 2019-10-23 LAB — PROCALCITONIN: Procalcitonin: 0.1 ng/mL

## 2019-10-23 LAB — LACTIC ACID, PLASMA
Lactic Acid, Venous: 1 mmol/L (ref 0.5–1.9)
Lactic Acid, Venous: 1.1 mmol/L (ref 0.5–1.9)

## 2019-10-23 LAB — LACTATE DEHYDROGENASE: LDH: 124 U/L (ref 98–192)

## 2019-10-23 LAB — FIBRINOGEN: Fibrinogen: 493 mg/dL — ABNORMAL HIGH (ref 210–475)

## 2019-10-23 MED ORDER — SODIUM CHLORIDE 0.9 % IV SOLN
1.0000 g | INTRAVENOUS | Status: DC
  Administered 2019-10-23 – 2019-10-24 (×2): 1 g via INTRAVENOUS
  Filled 2019-10-23: qty 10
  Filled 2019-10-23: qty 1
  Filled 2019-10-23 (×3): qty 10

## 2019-10-23 MED ORDER — DEXAMETHASONE SODIUM PHOSPHATE 10 MG/ML IJ SOLN
8.0000 mg | Freq: Once | INTRAMUSCULAR | Status: AC
  Administered 2019-10-23: 8 mg via INTRAVENOUS
  Filled 2019-10-23: qty 1

## 2019-10-23 MED ORDER — SODIUM CHLORIDE 0.9 % IV SOLN
100.0000 mg | Freq: Every day | INTRAVENOUS | Status: DC
  Administered 2019-10-24 – 2019-10-26 (×3): 100 mg via INTRAVENOUS
  Filled 2019-10-23: qty 20
  Filled 2019-10-23 (×2): qty 100

## 2019-10-23 MED ORDER — DEXAMETHASONE SODIUM PHOSPHATE 10 MG/ML IJ SOLN
6.0000 mg | INTRAMUSCULAR | Status: DC
  Administered 2019-10-24 – 2019-10-26 (×3): 6 mg via INTRAVENOUS
  Filled 2019-10-23 (×3): qty 1

## 2019-10-23 MED ORDER — SODIUM CHLORIDE 0.9 % IV SOLN: Freq: Once | INTRAVENOUS | Status: AC

## 2019-10-23 MED ORDER — SODIUM CHLORIDE 0.9 % IV SOLN
200.0000 mg | Freq: Once | INTRAVENOUS | Status: AC
  Administered 2019-10-23: 200 mg via INTRAVENOUS
  Filled 2019-10-23: qty 200
  Filled 2019-10-23: qty 40

## 2019-10-23 MED ORDER — ALBUTEROL SULFATE HFA 108 (90 BASE) MCG/ACT IN AERS
2.0000 | INHALATION_SPRAY | Freq: Once | RESPIRATORY_TRACT | Status: AC
  Administered 2019-10-23: 16:00:00 2 via RESPIRATORY_TRACT
  Filled 2019-10-23: qty 6.7

## 2019-10-23 MED ORDER — ENOXAPARIN SODIUM 40 MG/0.4ML ~~LOC~~ SOLN
40.0000 mg | SUBCUTANEOUS | Status: DC
  Administered 2019-10-23 – 2019-10-25 (×3): 40 mg via SUBCUTANEOUS
  Filled 2019-10-23 (×3): qty 0.4

## 2019-10-23 NOTE — H&P (Addendum)
History and Physical    [name] DOB: 05-08-1931 DOA: 10/23/2019  PCP: Marin Olp, MD  Patient coming from: Home, lives with husband but has family that goes over to care for them  I have personally briefly reviewed patient's old medical records in [place]  Chief Complaint: increase lethargy and low O2 saturation  HPI: [name] is a 86 y.o. female with medical history significant of dementia, asthma, CAD, hypertension, type 2 diabetes and history of DVT who presents with concerns of increased lethargy and decreased oxygen saturation.  History is provided by daughter, [name], at bedside due to patient's lethargy and altered mental status.  Patient was recently evaluated at [facility] ED for altered mental status and was found to have a UTI and also incidentally found to be COVID positive.  There was initial plans for admission but after about 16 hours of boarding in the ED patient had improved with IV Rocephin and fluids so family decided to take her home with close monitoring.  Since then she has been on cefdinir but daughter continues to notice gradual decline in her mental status as well as increased weakness.  Normally she is able to ambulate from her chair to the bathroom with a walker and minimal assist, but she has been unable to do this for the past few days.  It has been difficult even to feed her due to her lethargy. Today, daughter also noticed that her oxygen appeared to be low and decided to call EMS. She denies patient complaining of any pain.  No fever, cough but she does have a chronic runny nose.  No nausea, vomiting or diarrhea.  ED Course: She had a temperature of 100, hypertensive up to systolic of 0000000 on room air. WBC of 6.1, hemoglobin of 13.3. Glucose of 144. Lactic acid of 1.0. CPR of 2.  LDH 124, triglyceride 87, ferritin of 592, procalcitonin of 0.10.  D-dimer 3.9, fibrinogen of 493.  Chest x-ray showed stable appearance  of the chest with persistent small left-sided pleural effusion.  Review of Systems:  Unable to fully obtain due to pt's AMS  Past Medical History:  Diagnosis Date  . Alzheimer's dementia without behavioral disturbance ([place])    diagnosed 12/2015; Dr. Jade/neurology  . Anemia   . Asthma   . Blood transfusion 05-24-12  . Blood transfusion without reported diagnosis   . Chronic kidney disease    dr Clover Mealy  . Diabetes mellitus   . Diverticulosis   . DVT (deep venous thrombosis) ([place]) 03/2005   hx.left leg  . Esophageal stricture   . GERD (gastroesophageal reflux disease)   . Glaucoma   . Hiatal hernia 05/07/13  . Hyperlipidemia   . Hypertension   . Osteoarthritis   . Pancreatic duct dilated 05/07/13  . Pulmonary nodules 05/07/13  . PVD (peripheral vascular disease) ([place])   . Tubular adenoma of colon     Past Surgical History:  Procedure Laterality Date  . CATARACT EXTRACTION, BILATERAL    . COLONOSCOPY  05/27/2012   Procedure: COLONOSCOPY;  Surgeon: Lafayette Dragon, MD;  Location: [facility] ENDOSCOPY;  Service: Endoscopy;  Laterality: N/A;  . ESOPHAGOGASTRODUODENOSCOPY  05/26/2012   Procedure: ESOPHAGOGASTRODUODENOSCOPY (EGD);  Surgeon: Lafayette Dragon, MD;  Location: [facility];  Service: Endoscopy;  Laterality: N/A;  . EUS N/A 07/08/2013   Procedure: UPPER ENDOSCOPIC ULTRASOUND (EUS) LINEAR;  Surgeon: Milus Banister, MD;  Location: [facility] ENDOSCOPY;  Service: Endoscopy;  Laterality: N/A;  need  side view scope  . EYE SURGERY    . NM MYOVIEW LTD  10/2016   Read as intermediate risk due to EF of 38%.Anemia or infarction noted. Only abnormal septal WM.   Marland Kitchen TRANSTHORACIC ECHOCARDIOGRAM  10/2016    EF 45-50% with possible inferior hypokinesis. Aortic sclerosis without stenosis (ordered to reassess EF from Myoview)  . VASCULAR SURGERY  09/2004  . WRIST FRACTURE SURGERY     R wrist fracture, plate     reports that she has never smoked. She has never used smokeless tobacco. She reports that she  does not drink alcohol or use drugs.  No Known Allergies  Family History  Adopted: Yes  Problem Relation Age of Onset  . Migraines Daughter   . Cancer Son   . Colon cancer Neg Hx      Prior to Admission medications   Medication Sig Start Date End Date Taking? Authorizing Provider  albuterol (PROAIR HFA) 108 (90 Base) MCG/ACT inhaler 2 PUFFS EVERY 6 HOURS AS NEEDED FOR SHORTNESS OF BREATH. Patient taking differently: Inhale 2 puffs into the lungs every 6 (six) hours as needed for wheezing or shortness of breath.  12/18/18  Yes Marin Olp, MD  albuterol (PROVENTIL) (2.5 MG/3ML) 0.083% nebulizer solution USE THREE MILLILITERS VIA NEBULIZATION BY MOUTH EVERY 6 HOURS AS NEEDED FOR WHEEZING OR SHORTNESS OF BREATH Patient taking differently: Take 2.5 mg by nebulization daily.  12/08/18  Yes Marin Olp, MD  ascorbic acid (VITAMIN C) 500 MG tablet Take 500 mg by mouth 2 (two) times daily.   Yes [provider]  aspirin 81 MG tablet Take 81 mg by mouth daily.   Yes [provider]  brimonidine (ALPHAGAN P) 0.1 % SOLN Place 1 drop into both eyes 2 (two) times daily.  04/24/16  Yes [provider]  budesonide (PULMICORT) 0.25 MG/2ML nebulizer solution Take 2 mLs (0.25 mg total) by nebulization daily. ICD10 Code-J45.909 Patient taking differently: Take 0.25 mg by nebulization at bedtime. ICD10 Code-J45.909 12/08/18  Yes Marin Olp, MD  calcium carbonate (OS-CAL) 600 MG TABS Take 600 mg by mouth 2 (two) times daily with a meal.   Yes [provider]  cefdinir (OMNICEF) 300 MG capsule Take 1 capsule (300 mg total) by mouth 2 (two) times daily for 10 days. 10/21/19 10/31/19 Yes Sherwood Gambler, MD  citalopram (CELEXA) 10 MG tablet TAKE 1 TABLET ONCE DAILY. Patient taking differently: Take 10 mg by mouth daily.  09/24/19  Yes Marin Olp, MD  donepezil (ARICEPT) 10 MG tablet TAKE ONE TABLET AT BEDTIME. Patient taking differently: Take 10 mg by mouth  at bedtime.  06/01/19  Yes Marin Olp, MD  famotidine (PEPCID) 20 MG tablet TAKE ONE TABLET TWICE A DAY AS NEEDED FOR HEARTBURN OR INDIGESTION. Patient taking differently: Take 20 mg by mouth daily as needed for heartburn or indigestion.  09/24/19  Yes Marin Olp, MD  ferrous sulfate 325 (65 FE) MG EC tablet Take 325 mg by mouth daily with breakfast.    Yes [provider]  memantine (NAMENDA) 10 MG tablet TAKE 1 TABLET TWICE DAILY FOR MEMORY. Patient taking differently: Take 10 mg by mouth 2 (two) times daily.  10/04/19  Yes Pieter Partridge, DO  Multiple Vitamin (MULTIVITAMIN WITH MINERALS) TABS tablet Take 1 tablet by mouth daily.   Yes [provider]  amoxicillin-clavulanate (AUGMENTIN) 400-57 MG/5ML suspension TAKE 7.5 MILLILITERS TWICE DAILY. Patient not taking: TAKE 7.5 MILLILITERS TWICE DAILY. 06/02/19  Evelina Bucy, DPM  azelastine (ASTELIN) 0.1 % nasal spray Place 1 spray into both nostrils 2 (two) times daily. Use in each nostril as directed Patient not taking: Reported on 10/23/2019 05/01/18   Marin Olp, MD  mupirocin ointment (BACTROBAN) 2 % Apply 1 application topically 2 (two) times daily. Apply to the affected area 2 times a day Patient not taking: Reported on 10/23/2019 07/14/18   Newt Minion, MD  nitroGLYCERIN (NITRODUR - DOSED IN MG/24 HR) 0.2 mg/hr patch Place 1 patch (0.2 mg total) onto the skin daily. Patient not taking: Reported on 10/23/2019 11/17/18   Newt Minion, MD  nitroGLYCERIN (NITRODUR - DOSED IN MG/24 HR) 0.2 mg/hr patch Place 1 patch (0.2 mg total) onto the skin daily. Patient not taking: Reported on 10/23/2019 06/03/19   Newt Minion, MD  pentoxifylline (TRENTAL) 400 MG CR tablet Take 1 tablet (400 mg total) by mouth 3 (three) times daily with meals. Patient not taking: Reported on 10/23/2019 06/03/19   Newt Minion, MD  silver sulfADIAZINE (SILVADENE) 1 % cream Apply 1 application topically daily. Patient not taking: Reported  on 10/23/2019 06/29/19   Newt Minion, MD  Wound Dressings (MEDIHONEY WOUND/BURN DRESSING) GEL Apply to affected are 3 times a week, and cover with sterile dressing. Patient not taking: Reported on 10/23/2019 05/25/19   Evelina Bucy, DPM    Physical Exam: Vitals:   10/23/19 1800 10/23/19 1815 10/23/19 1830 10/23/19 1900  BP: (!) 163/69 (!) 163/66 (!) 166/63 (!) 165/70  Pulse: (!) 53 (!) 54 (!) 55 (!) 56  Resp: 16 16 15 16   Temp:      TempSrc:      SpO2: 100% 100% 100% 99%  Weight:      Height:        Constitutional: NAD, calm, comfortable, non-toxic appearing thin elderly female laying flat in bed. Briefly opened eyes with noxious stimuli.  Vitals:   10/23/19 1800 10/23/19 1815 10/23/19 1830 10/23/19 1900  BP: (!) 163/69 (!) 163/66 (!) 166/63 (!) 165/70  Pulse: (!) 53 (!) 54 (!) 55 (!) 56  Resp: 16 16 15 16   Temp:      TempSrc:      SpO2: 100% 100% 100% 99%  Weight:      Height:       Eyes: Kept eyes shut tight when exam was attempted ENMT: Mucous membranes are moist.  Neck: normal, supple Respiratory: clear to auscultation anteriorly. Normal respiratory effort on room air. No accessory muscle use.  Cardiovascular: sinus bradycardia with HR in 50s, no murmurs / rubs / gallops. No extremity edema.   Abdomen: no tenderness, no masses palpated. Bowel sounds positive.  Musculoskeletal: no clubbing / cyanosis. No joint deformity upper and lower extremities.  Normal muscle tone.  Skin: no rashes, lesions, ulcers. No induration Neurologic: unable to access given lethargy and AMS. Was able to open eyes with noxious stimuli briefly. Could not follow simple commands . Psychiatric: unable to access due to AMS.    Labs on Admission: I have personally reviewed following labs and imaging studies  CBC: Recent Labs  Lab 10/20/19 1610 10/23/19 1604  WBC 8.7 6.1  NEUTROABS 5.5 3.6  HGB 13.9 13.3  HCT 42.5 41.7  MCV 101.4* 103.2*  PLT 234 0000000   Basic Metabolic Panel: Recent Labs    Lab 10/20/19 1610 10/23/19 1604  NA 135 143  K 4.1 3.7  CL 102 105  CO2 27 30  GLUCOSE 162*  144*  BUN 24* 19  CREATININE 1.01* 0.81  CALCIUM 9.7 9.3   GFR: Estimated Creatinine Clearance: 40.9 mL/min (by C-G formula based on SCr of 0.81 mg/dL). Liver Function Tests: Recent Labs  Lab 10/20/19 1610 10/23/19 1604  AST 31 33  ALT 24 23  ALKPHOS 56 53  BILITOT 0.5 0.5  PROT 6.8 6.8  ALBUMIN 3.3* 3.3*   No results for input(s): LIPASE, AMYLASE in the last 168 hours. No results for input(s): AMMONIA in the last 168 hours. Coagulation Profile: No results for input(s): INR, PROTIME in the last 168 hours. Cardiac Enzymes: No results for input(s): CKTOTAL, CKMB, CKMBINDEX, TROPONINI in the last 168 hours. BNP (last 3 results) No results for input(s): PROBNP in the last 8760 hours. HbA1C: No results for input(s): HGBA1C in the last 72 hours. CBG: Recent Labs  Lab 10/20/19 2220 10/21/19 0611  GLUCAP 103* 103*   Lipid Profile: Recent Labs    10/23/19 1604  TRIG 87   Thyroid Function Tests: No results for input(s): TSH, T4TOTAL, FREET4, T3FREE, THYROIDAB in the last 72 hours. Anemia Panel: Recent Labs    10/23/19 1604  FERRITIN 592*   Urine analysis:    Component Value Date/Time   COLORURINE YELLOW 10/20/2019 [place] 10/20/2019 1715   LABSPEC 1.025 10/20/2019 1715   PHURINE 6.0 10/20/2019 [place] 10/20/2019 [place] 10/20/2019 [place] 10/20/2019 1715   BILIRUBINUR Negative 02/12/2019 [place] 10/20/2019 [place] 10/20/2019 1715   UROBILINOGEN 1.0 02/12/2019 1019   UROBILINOGEN 0.2 05/26/2012 1113   NITRITE POSITIVE (A) 10/20/2019 [place] 10/20/2019 1715    Radiological Exams on Admission: DG Chest Port 1 View  Result Date: 10/23/2019 CLINICAL DATA:  COVID positive EXAM: PORTABLE CHEST 1 VIEW COMPARISON:  October 20, 2019 FINDINGS:  Again noted are findings of COPD. There is no pneumothorax. There may be a small left-sided pleural effusion. The heart size is stable from prior study. Aortic calcifications are noted. There is no acute osseous abnormality detected. IMPRESSION: 1. Stable appearance of the chest with a persistent small left-sided pleural effusion. 2. COPD. 3. Aortic calcifications are again noted. Electronically Signed   By: Constance Holster M.D.   On: 10/23/2019 15:42    EKG: Independently reviewed.   Assessment/Plan  Acute metabolic encephalopathy secondary COVID infection and questionable UTI pt found to have UA with positive nitrate and many bacteria on 12/30 but urine culture later returned with multiple species so unsure whether this was a contaminate. Will repeat urine culture. Given her current ill appearance, will continue treatment with IV rocephin.   Acute hypoxic respiratory failure due to COVID pneumonia Admit on 2L via [place] IV decadron Remdesivir  PT eval when mentation improves   Dementia baseline per daughter is that she is alert and oriented to self and family.  She is lethargic and only open eyes briefly to noxious stimuli  Unclear if this is all due to COVID infection. Last CT head on 12/20 was normal. Consider repeat imaging if no improvement.  Continue Aricept, memantine, Celexa  Type 2 diabetes  CBG of 144 Start on sensitive sliding scale while on steroids  Hypertension  systolic [id number] on admit  No antihypertensive on med list PRN hydralazine 5mg  systolic greater than [id number] greater than [id number]  Asymptomatic bradycardia Continue to monitor on telemetry  Asthma does not appear to be in exacerbation  Continue bronchodilator  DVT prophylaxis:.Lovenox Code Status:FULL - daughter called patient's husband and another daughter over the phone and confirmed.  Family Communication: Plan discussed with daughter bedside  disposition Plan: Home with at least 2 midnight stays    Consults called:  Admission status: inpatient   Javan Gonzaga T Khaza Blansett DO [facility]  If 7PM-7AM, please contact night-coverage [email or web address] Password [email or web address]  10/23/2019, 7:44 PM

## 2019-10-23 NOTE — ED Notes (Signed)
Pt's brief soiled in urine upon arrival to the ED. Pts brief changed and Purewick applied. Daughter reports pt is oliguria despite their efforts to hydrate the pt at home.

## 2019-10-23 NOTE — ED Notes (Signed)
RN has updated patient family member at bedside at this time.

## 2019-10-23 NOTE — ED Provider Notes (Signed)
[place] DEPT Provider Note   CSN: [id number] Arrival date & time: 10/23/19  1434     History Chief Complaint  Patient presents with  . COVID +  . Shortness of Breath  . Urinary Tract Infection   Level 5 caveat due to dementia  [name] is a 86 y.o. female with history of Alzheimer's dementia, asthma, CKD, diabetes mellitus, DVT, diverticulitis, GERD, hyperlipidemia, hypertension, peripheral vascular disease presents for evaluation of progressively worsening generalized weakness, altered mental status, shortness of breath.  She was seen and evaluated at [facility] on 10/20/2019 for the symptoms found to have a UTI was incidentally also noted to be Covid positive.  She was initially admitted but while boarding in the emergency department family wanted to take the patient home and at that time she was asymptomatic from a Covid standpoint.  She was discharged home with a course of antibiotics for 10 days.  Per chart review urine culture grew multiple species.  On EMS arrival today patient was noted to be hypoxic at 88% on room air was placed on 3 L supplemental oxygen with improved saturations.  She tells me nothing is bothering her and she does not know why she is here.  She is oriented to person and place but not year and does not know who the president of the noted states is.  The history is provided by medical records, a relative and the EMS personnel. The history is limited by the condition of the patient.       Past Medical History:  Diagnosis Date  . Alzheimer's dementia without behavioral disturbance ([place])    diagnosed 12/2015; Dr. Jade/neurology  . Anemia   . Asthma   . Blood transfusion 05-24-12  . Blood transfusion without reported diagnosis   . Chronic kidney disease    dr Clover Mealy  . Diabetes mellitus   . Diverticulosis   . DVT (deep venous thrombosis) ([place]) 03/2005   hx.left leg  . Esophageal stricture   . GERD  (gastroesophageal reflux disease)   . Glaucoma   . Hiatal hernia 05/07/13  . Hyperlipidemia   . Hypertension   . Osteoarthritis   . Pancreatic duct dilated 05/07/13  . Pulmonary nodules 05/07/13  . PVD (peripheral vascular disease) ([place])   . Tubular adenoma of colon     Patient Active Problem List   Diagnosis Date Noted  . UTI (urinary tract infection) 10/20/2019  . Protein-calorie malnutrition ([place]) 07/21/2018  . Osteoporosis 07/02/2018  . Diabetic ulcer of foot with fat layer exposed ([place]) 05/14/2018  . GERD (gastroesophageal reflux disease) 05/02/2018  . Generalized weakness 05/02/2018  . History of DVT in adulthood 06/26/2017  . LBBB (left bundle branch block) 10/09/2016  . Pleural effusion, left 09/23/2016  . Coronary artery calcification seen on CAT scan 08/28/2016  . Alzheimer's disease ([place]) 01/04/2016  . Pancreas cyst 07/02/2015  . Asthma, chronic 03/16/2015  . Primary open angle glaucoma of left eye, moderate stage 12/25/2014  . Pseudophakia of both eyes 02/15/2013  . Anemia 05/25/2012  . Astigmatism 04/29/2012  . Allergic rhinitis 10/30/2010  . Venous (peripheral) insufficiency 04/13/2007  . Diabetes ([place]) 12/18/2006  . Hypertension associated with diabetes ([place]) 12/18/2006  . Atherosclerosis of native arteries of extremity with intermittent claudication ([place]) 12/18/2006  . OSTEOARTHRITIS OF SPINE, NOS 12/18/2006    Past Surgical History:  Procedure Laterality Date  . CATARACT EXTRACTION, BILATERAL    . COLONOSCOPY  05/27/2012   Procedure: COLONOSCOPY;  Surgeon:  Lafayette Dragon, MD;  Location: [facility];  Service: Endoscopy;  Laterality: N/A;  . ESOPHAGOGASTRODUODENOSCOPY  05/26/2012   Procedure: ESOPHAGOGASTRODUODENOSCOPY (EGD);  Surgeon: Lafayette Dragon, MD;  Location: [facility];  Service: Endoscopy;  Laterality: N/A;  . EUS N/A 07/08/2013   Procedure: UPPER ENDOSCOPIC ULTRASOUND (EUS) LINEAR;  Surgeon: Milus Banister, MD;  Location: [facility] ENDOSCOPY;  Service:  Endoscopy;  Laterality: N/A;  need side view scope  . EYE SURGERY    . NM MYOVIEW LTD  10/2016   Read as intermediate risk due to EF of 38%.Anemia or infarction noted. Only abnormal septal WM.   Marland Kitchen TRANSTHORACIC ECHOCARDIOGRAM  10/2016    EF 45-50% with possible inferior hypokinesis. Aortic sclerosis without stenosis (ordered to reassess EF from Myoview)  . VASCULAR SURGERY  09/2004  . WRIST FRACTURE SURGERY     R wrist fracture, plate     OB History   No obstetric history on file.     Family History  Adopted: Yes  Problem Relation Age of Onset  . Migraines Daughter   . Cancer Son   . Colon cancer Neg Hx     Social History   Tobacco Use  . Smoking status: Never Smoker  . Smokeless tobacco: Never Used  Substance Use Topics  . Alcohol use: No  . Drug use: No    Home Medications Prior to Admission medications   Medication Sig Start Date End Date Taking? Authorizing Provider  albuterol (PROAIR HFA) 108 (90 Base) MCG/ACT inhaler 2 PUFFS EVERY 6 HOURS AS NEEDED FOR SHORTNESS OF BREATH. Patient taking differently: Inhale 2 puffs into the lungs every 6 (six) hours as needed for wheezing or shortness of breath.  12/18/18  Yes Marin Olp, MD  albuterol (PROVENTIL) (2.5 MG/3ML) 0.083% nebulizer solution USE THREE MILLILITERS VIA NEBULIZATION BY MOUTH EVERY 6 HOURS AS NEEDED FOR WHEEZING OR SHORTNESS OF BREATH Patient taking differently: Take 2.5 mg by nebulization daily.  12/08/18  Yes Marin Olp, MD  ascorbic acid (VITAMIN C) 500 MG tablet Take 500 mg by mouth 2 (two) times daily.   Yes [provider]  aspirin 81 MG tablet Take 81 mg by mouth daily.   Yes [provider]  brimonidine (ALPHAGAN P) 0.1 % SOLN Place 1 drop into both eyes 2 (two) times daily.  04/24/16  Yes [provider]  budesonide (PULMICORT) 0.25 MG/2ML nebulizer solution Take 2 mLs (0.25 mg total) by nebulization daily. ICD10 Code-J45.909 Patient taking differently: Take 0.25  mg by nebulization at bedtime. ICD10 Code-J45.909 12/08/18  Yes Marin Olp, MD  calcium carbonate (OS-CAL) 600 MG TABS Take 600 mg by mouth 2 (two) times daily with a meal.   Yes [provider]  cefdinir (OMNICEF) 300 MG capsule Take 1 capsule (300 mg total) by mouth 2 (two) times daily for 10 days. 10/21/19 10/31/19 Yes Sherwood Gambler, MD  citalopram (CELEXA) 10 MG tablet TAKE 1 TABLET ONCE DAILY. Patient taking differently: Take 10 mg by mouth daily.  09/24/19  Yes Marin Olp, MD  donepezil (ARICEPT) 10 MG tablet TAKE ONE TABLET AT BEDTIME. Patient taking differently: Take 10 mg by mouth at bedtime.  06/01/19  Yes Marin Olp, MD  famotidine (PEPCID) 20 MG tablet TAKE ONE TABLET TWICE A DAY AS NEEDED FOR HEARTBURN OR INDIGESTION. Patient taking differently: Take 20 mg by mouth daily as needed for heartburn or indigestion.  09/24/19  Yes Marin Olp, MD  ferrous sulfate 325 (65  FE) MG EC tablet Take 325 mg by mouth daily with breakfast.    Yes [provider]  memantine (NAMENDA) 10 MG tablet TAKE 1 TABLET TWICE DAILY FOR MEMORY. Patient taking differently: Take 10 mg by mouth 2 (two) times daily.  10/04/19  Yes Pieter Partridge, DO  Multiple Vitamin (MULTIVITAMIN WITH MINERALS) TABS tablet Take 1 tablet by mouth daily.   Yes [provider]  amoxicillin-clavulanate (AUGMENTIN) 400-57 MG/5ML suspension TAKE 7.5 MILLILITERS TWICE DAILY. Patient not taking: TAKE 7.5 MILLILITERS TWICE DAILY. 06/02/19   Evelina Bucy, DPM  azelastine (ASTELIN) 0.1 % nasal spray Place 1 spray into both nostrils 2 (two) times daily. Use in each nostril as directed Patient not taking: Reported on 10/23/2019 05/01/18   Marin Olp, MD  mupirocin ointment (BACTROBAN) 2 % Apply 1 application topically 2 (two) times daily. Apply to the affected area 2 times a day Patient not taking: Reported on 10/23/2019 07/14/18   Newt Minion, MD  nitroGLYCERIN (NITRODUR - DOSED IN MG/24  HR) 0.2 mg/hr patch Place 1 patch (0.2 mg total) onto the skin daily. Patient not taking: Reported on 10/23/2019 11/17/18   Newt Minion, MD  nitroGLYCERIN (NITRODUR - DOSED IN MG/24 HR) 0.2 mg/hr patch Place 1 patch (0.2 mg total) onto the skin daily. Patient not taking: Reported on 10/23/2019 06/03/19   Newt Minion, MD  pentoxifylline (TRENTAL) 400 MG CR tablet Take 1 tablet (400 mg total) by mouth 3 (three) times daily with meals. Patient not taking: Reported on 10/23/2019 06/03/19   Newt Minion, MD  silver sulfADIAZINE (SILVADENE) 1 % cream Apply 1 application topically daily. Patient not taking: Reported on 10/23/2019 06/29/19   Newt Minion, MD  Wound Dressings (MEDIHONEY WOUND/BURN DRESSING) GEL Apply to affected are 3 times a week, and cover with sterile dressing. Patient not taking: Reported on 10/23/2019 05/25/19   Evelina Bucy, DPM    Allergies    Patient has no known allergies.  Review of Systems   Review of Systems  Unable to perform ROS: Dementia    Physical Exam Updated Vital Signs BP (!) 163/66   Pulse (!) 54   Temp (S) 100 F (37.8 C) (Rectal)   Resp 16   Ht (S) 5\' 6"  (1.676 m)   Wt (S) 52 kg   LMP  (LMP Unknown)   SpO2 100%   BMI 18.50 kg/m   Physical Exam Vitals and nursing note reviewed.  Constitutional:      General: She is not in acute distress.    Appearance: She is well-developed.  HENT:     Head: Normocephalic and atraumatic.  Eyes:     General:        Right eye: No discharge.        Left eye: No discharge.     Conjunctiva/sclera: Conjunctivae normal.  Neck:     Vascular: No JVD.     Trachea: No tracheal deviation.  Cardiovascular:     Rate and Rhythm: Normal rate and regular rhythm.     Comments: 2+ radial and DP/PT pulses bilaterally, Homans sign absent bilaterally, no lower extremity edema, no palpable cords, compartments are soft  Pulmonary:     Effort: Tachypnea present.     Breath sounds: Examination of the right-upper field reveals  wheezing. Examination of the left-upper field reveals wheezing. Decreased breath sounds and wheezing present.     Comments: Speaking in short phrases.  Soft expiratory wheezes on auscultation of  the upper lung fields.  SPO2 saturations 100% on 2 L supplemental oxygen Abdominal:     General: Bowel sounds are normal. There is no distension.     Palpations: Abdomen is soft.     Tenderness: There is no abdominal tenderness.  Musculoskeletal:     Cervical back: Normal range of motion and neck supple.     Right lower leg: No tenderness. No edema.     Left lower leg: No tenderness. No edema.  Skin:    General: Skin is warm and dry.     Findings: No erythema.  Neurological:     Mental Status: She is alert.  Psychiatric:        Behavior: Behavior normal.     ED Results / Procedures / Treatments   Labs (all labs ordered are listed, but only abnormal results are displayed) Labs Reviewed  CBC WITH DIFFERENTIAL/PLATELET - Abnormal; Notable for the following components:      Result Value   MCV 103.2 (*)    All other components within normal limits  COMPREHENSIVE METABOLIC PANEL - Abnormal; Notable for the following components:   Glucose, Bld 144 (*)    Albumin 3.3 (*)    All other components within normal limits  D-DIMER, QUANTITATIVE (NOT AT [facility]) - Abnormal; Notable for the following components:   D-Dimer, Quant 3.90 (*)    All other components within normal limits  FERRITIN - Abnormal; Notable for the following components:   Ferritin 592 (*)    All other components within normal limits  FIBRINOGEN - Abnormal; Notable for the following components:   Fibrinogen 493 (*)    All other components within normal limits  C-REACTIVE PROTEIN - Abnormal; Notable for the following components:   CRP 2.0 (*)    All other components within normal limits  CULTURE, BLOOD (ROUTINE X 2)  CULTURE, BLOOD (ROUTINE X 2)  URINE CULTURE  LACTIC ACID, PLASMA  PROCALCITONIN  LACTATE DEHYDROGENASE    TRIGLYCERIDES  LACTIC ACID, PLASMA  URINALYSIS, ROUTINE W REFLEX MICROSCOPIC    EKG EKG Interpretation  Date/Time:  Saturday October 23 2019 14:55:03 EST Ventricular Rate:  62 PR Interval:    QRS Duration: 128 QT Interval:  443 QTC Calculation: 450 R Axis:   68 Text Interpretation: Sinus rhythm Left bundle branch block since last tracing no significant change Confirmed by Daleen Bo [phone]) on 10/23/2019 3:38:42 PM   Radiology DG Chest Port 1 View  Result Date: 10/23/2019 CLINICAL DATA:  COVID positive EXAM: PORTABLE CHEST 1 VIEW COMPARISON:  October 20, 2019 FINDINGS: Again noted are findings of COPD. There is no pneumothorax. There may be a small left-sided pleural effusion. The heart size is stable from prior study. Aortic calcifications are noted. There is no acute osseous abnormality detected. IMPRESSION: 1. Stable appearance of the chest with a persistent small left-sided pleural effusion. 2. COPD. 3. Aortic calcifications are again noted. Electronically Signed   By: Constance Holster M.D.   On: 10/23/2019 15:42    Procedures .Critical Care Performed by: Renita Papa, PA-C Authorized by: Renita Papa, PA-C   Critical care provider statement:    Critical care time (minutes):  45   Critical care was necessary to treat or prevent imminent or life-threatening deterioration of the following conditions:  Respiratory failure   Critical care was time spent personally by me on the following activities:  Discussions with consultants, evaluation of patient's response to treatment, examination of patient, ordering and performing treatments and interventions, ordering  and review of laboratory studies, ordering and review of radiographic studies, pulse oximetry, re-evaluation of patient's condition, obtaining history from patient or surrogate and review of old charts   (including critical care time)  Medications Ordered in ED Medications  dexamethasone (DECADRON) injection 8 mg  (8 mg Intravenous Given 10/23/19 1619)  albuterol (VENTOLIN HFA) 108 (90 Base) MCG/ACT inhaler 2 puff (2 puffs Inhalation Given 10/23/19 1618)  0.9 %  sodium chloride infusion ( Intravenous New Bag/Given 10/23/19 1844)    ED Course  I have reviewed the triage vital signs and the nursing notes.  Pertinent labs & imaging results that were available during my care of the patient were reviewed by me and considered in my medical decision making (see chart for details).    MDM Rules/Calculators/A&P                      [name] was evaluated in Emergency Department on 10/23/2019 for the symptoms described in the history of present illness. She was evaluated in the context of the global COVID-19 pandemic, which necessitated consideration that the patient might be at risk for infection with the SARS-CoV-2 virus that causes COVID-19. Institutional protocols and algorithms that pertain to the evaluation of patients at risk for COVID-19 are in a state of rapid change based on information released by regulatory bodies including the CDC and federal and state organizations. These policies and algorithms were followed during the patient's care in the ED.  Patient presenting for evaluation of generalized weakness, decreased appetite and oral intake, inability to complete activities of daily living.  Found to be hypoxic on EMS arrival to 88% on room air with improvement on supplemental oxygen.  Was recently admitted for UTI with similar symptoms but was found to incidentally be Covid positive.  She was asymptomatic from a respiratory standpoint with regards to Covid and family wanted to take her home so she was discharged with a 10-day course of antibiotics.  Urine culture grew multiple species.  On my assessment she is resting comfortably, tachypneic but answering most questions.  She is mildly confused.  Moves all extremities spontaneously.  No facial droop noted, no signs of acute intracranial abnormality.  Lab work  reviewed by me shows no leukocytosis, no anemia, no metabolic derangements, no renal insufficiency.  Inflammatory markers are elevated.  Her chest x-ray shows small left-sided pleural effusion and findings consistent with COPD.  She did have some wheezing on auscultation of the lungs and she was given IV Decadron and a few puffs of albuterol in the ED.  Has not been able to produce urine for [place] yet but will check UA to confirm UTI.  Spoke with Dr. Flossie Buffy with [facility] hospitalist service who agrees to assume care of patient and bring her into the hospital for further evaluation and management.   Final Clinical Impression(s) / ED Diagnoses Final diagnoses:  Acute respiratory failure with hypoxia ([place])  COVID-19 virus infection    Rx / DC Orders ED Discharge Orders    None       Debroah Baller 10/23/19 1853    Daleen Bo, MD 10/24/19 1228

## 2019-10-23 NOTE — ED Triage Notes (Signed)
Patient arrived via [facility]. Patient is at baseline per family and EMS. Patient was tested 2x days ago and is positive and has since begun to have increased lethargy and SOB. Patient is now on 3L nasal canula and off non-rebreather. Patient family does state that she does have a UTI. Patient does have dementia and at baseline.

## 2019-10-23 NOTE — ED Notes (Signed)
Family member at bedside.

## 2019-10-23 NOTE — ED Notes (Signed)
Daughter at bedside.

## 2019-10-24 DIAGNOSIS — U071 COVID-19: Principal | ICD-10-CM

## 2019-10-24 DIAGNOSIS — R4182 Altered mental status, unspecified: Secondary | ICD-10-CM

## 2019-10-24 DIAGNOSIS — R001 Bradycardia, unspecified: Secondary | ICD-10-CM

## 2019-10-24 DIAGNOSIS — I10 Essential (primary) hypertension: Secondary | ICD-10-CM

## 2019-10-24 LAB — URINALYSIS, ROUTINE W REFLEX MICROSCOPIC
Bacteria, UA: NONE SEEN
Bilirubin Urine: NEGATIVE
Glucose, UA: 150 mg/dL — AB
Hgb urine dipstick: NEGATIVE
Ketones, ur: 5 mg/dL — AB
Nitrite: NEGATIVE
Protein, ur: NEGATIVE mg/dL
Specific Gravity, Urine: 1.02 (ref 1.005–1.030)
pH: 5 (ref 5.0–8.0)

## 2019-10-24 LAB — CBC WITH DIFFERENTIAL/PLATELET
Abs Immature Granulocytes: 0.03 10*3/uL (ref 0.00–0.07)
Basophils Absolute: 0 10*3/uL (ref 0.0–0.1)
Basophils Relative: 0 %
Eosinophils Absolute: 0 10*3/uL (ref 0.0–0.5)
Eosinophils Relative: 0 %
HCT: 38.8 % (ref 36.0–46.0)
Hemoglobin: 12.7 g/dL (ref 12.0–15.0)
Immature Granulocytes: 1 %
Lymphocytes Relative: 20 %
Lymphs Abs: 1 10*3/uL (ref 0.7–4.0)
MCH: 33.2 pg (ref 26.0–34.0)
MCHC: 32.7 g/dL (ref 30.0–36.0)
MCV: 101.3 fL — ABNORMAL HIGH (ref 80.0–100.0)
Monocytes Absolute: 0.1 10*3/uL (ref 0.1–1.0)
Monocytes Relative: 3 %
Neutro Abs: 3.7 10*3/uL (ref 1.7–7.7)
Neutrophils Relative %: 76 %
Platelets: 186 10*3/uL (ref 150–400)
RBC: 3.83 MIL/uL — ABNORMAL LOW (ref 3.87–5.11)
RDW: 12.1 % (ref 11.5–15.5)
WBC: 4.9 10*3/uL (ref 4.0–10.5)
nRBC: 0 % (ref 0.0–0.2)

## 2019-10-24 LAB — ABO/RH: ABO/RH(D): O POS

## 2019-10-24 LAB — C-REACTIVE PROTEIN: CRP: 1.6 mg/dL — ABNORMAL HIGH (ref <1.0)

## 2019-10-24 LAB — GLUCOSE, CAPILLARY: Glucose-Capillary: 134 mg/dL — ABNORMAL HIGH (ref 70–99)

## 2019-10-24 LAB — FERRITIN: Ferritin: 591 ng/mL — ABNORMAL HIGH (ref 11–307)

## 2019-10-24 LAB — HEMOGLOBIN A1C
Hgb A1c MFr Bld: 6.9 % — ABNORMAL HIGH (ref 4.8–5.6)
Mean Plasma Glucose: 151.33 mg/dL

## 2019-10-24 LAB — CBG MONITORING, ED
Glucose-Capillary: 219 mg/dL — ABNORMAL HIGH (ref 70–99)
Glucose-Capillary: 256 mg/dL — ABNORMAL HIGH (ref 70–99)
Glucose-Capillary: 209 mg/dL — ABNORMAL HIGH (ref 70–99)

## 2019-10-24 LAB — D-DIMER, QUANTITATIVE: D-Dimer, Quant: 2.88 ug/mL-FEU — ABNORMAL HIGH (ref 0.00–0.50)

## 2019-10-24 MED ORDER — CITALOPRAM HYDROBROMIDE 20 MG PO TABS
10.0000 mg | ORAL_TABLET | Freq: Every day | ORAL | Status: DC
  Administered 2019-10-24 – 2019-10-26 (×3): 10 mg via ORAL
  Filled 2019-10-24 (×3): qty 1

## 2019-10-24 MED ORDER — DONEPEZIL HCL 10 MG PO TABS
10.0000 mg | ORAL_TABLET | Freq: Every day | ORAL | Status: DC
  Administered 2019-10-24 – 2019-10-25 (×2): 10 mg via ORAL
  Filled 2019-10-24 (×2): qty 1

## 2019-10-24 MED ORDER — MEMANTINE HCL 10 MG PO TABS
10.0000 mg | ORAL_TABLET | Freq: Two times a day (BID) | ORAL | Status: DC
  Administered 2019-10-24 – 2019-10-26 (×5): 10 mg via ORAL
  Filled 2019-10-24 (×7): qty 1

## 2019-10-24 MED ORDER — INSULIN ASPART 100 UNIT/ML ~~LOC~~ SOLN
0.0000 [IU] | Freq: Three times a day (TID) | SUBCUTANEOUS | Status: DC
  Administered 2019-10-24 (×2): 3 [IU] via SUBCUTANEOUS
  Administered 2019-10-24: 5 [IU] via SUBCUTANEOUS
  Administered 2019-10-25: 1 [IU] via SUBCUTANEOUS
  Administered 2019-10-25: 5 [IU] via SUBCUTANEOUS
  Administered 2019-10-26: 08:00:00 1 [IU] via SUBCUTANEOUS
  Administered 2019-10-26: 2 [IU] via SUBCUTANEOUS
  Filled 2019-10-24: qty 0.09

## 2019-10-24 MED ORDER — SODIUM CHLORIDE 0.9 % IV SOLN: INTRAVENOUS | Status: DC | PRN

## 2019-10-24 MED ORDER — HYDRALAZINE HCL 20 MG/ML IJ SOLN
5.0000 mg | Freq: Four times a day (QID) | INTRAMUSCULAR | Status: DC | PRN
  Administered 2019-10-26: 5 mg via INTRAVENOUS
  Filled 2019-10-24: qty 1

## 2019-10-24 MED ORDER — BUDESONIDE 0.25 MG/2ML IN SUSP: 0.2500 mg | Freq: Every day | RESPIRATORY_TRACT | Status: DC

## 2019-10-24 MED ORDER — BUDESONIDE 180 MCG/ACT IN AEPB
1.0000 | INHALATION_SPRAY | Freq: Two times a day (BID) | RESPIRATORY_TRACT | Status: DC
  Administered 2019-10-24 – 2019-10-26 (×4): 1 via RESPIRATORY_TRACT
  Filled 2019-10-24: qty 1

## 2019-10-24 MED ORDER — BRIMONIDINE TARTRATE 0.15 % OP SOLN
1.0000 [drp] | Freq: Two times a day (BID) | OPHTHALMIC | Status: DC
  Administered 2019-10-24 – 2019-10-26 (×5): 1 [drp] via OPHTHALMIC
  Filled 2019-10-24: qty 5

## 2019-10-24 MED ORDER — ASPIRIN EC 81 MG PO TBEC
81.0000 mg | DELAYED_RELEASE_TABLET | Freq: Every day | ORAL | Status: DC
  Administered 2019-10-24 – 2019-10-26 (×3): 81 mg via ORAL
  Filled 2019-10-24 (×4): qty 1

## 2019-10-24 MED ORDER — FERROUS SULFATE 325 (65 FE) MG PO TABS
325.0000 mg | ORAL_TABLET | Freq: Every day | ORAL | Status: DC
  Administered 2019-10-24 – 2019-10-26 (×3): 325 mg via ORAL
  Filled 2019-10-24 (×4): qty 1

## 2019-10-24 NOTE — ED Notes (Signed)
Pt's visitor (daughter) called out very frustrated that no one had communicated to her any questions she has had. Writer apologized that communication had been lacked and attempted to answer any questions she had. Pt's visitor  feeling much better about questions she has had, and was given food due to outside food not being allowed in the ED. Visitor made aware to please call out if she has any questions regarding the rules for the ED or questions regarding the pt. Visitor aware they will be holding in the ED until a bed is available for the pt, and updated on the next thing pending for the pt.

## 2019-10-24 NOTE — ED Notes (Signed)
Pt given blanket and pillow.

## 2019-10-24 NOTE — ED Notes (Signed)
Patient has been place in a hospital bed.

## 2019-10-24 NOTE — ED Notes (Signed)
Pt given sprite upon request  

## 2019-10-24 NOTE — ED Notes (Signed)
ED TO INPATIENT HANDOFF REPORT  ED Nurse Name and Phone #: [phone]  S Name/Age/Gender [name] 86 y.o. female Room/Bed: WA22/WA22  Code Status   Code Status: Full Code  Home/SNF/Other Home Patient oriented to: self and place Is this baseline? Yes   Triage Complete: Triage complete  Chief Complaint COVID-19 virus infection [U07.1]  Triage Note Patient arrived via [facility]. Patient is at baseline per family and EMS. Patient was tested 2x days ago and is positive and has since begun to have increased lethargy and SOB. Patient is now on 3L nasal canula and off non-rebreather. Patient family does state that she does have a UTI. Patient does have dementia and at baseline.    Allergies No Known Allergies  Level of Care/Admitting Diagnosis ED Disposition    ED Disposition Condition Comment   Admit  Hospital Area: [facility] [facility] [[id number]]  Level of Care: Telemetry [5]  Admit to tele based on following criteria: Other see comments  Comments: AMS- lethargic  Covid Evaluation: Confirmed COVID Positive  Diagnosis: COVID-19 virus infection [id number]  Admitting Physician: Orene Desanctis [id number]  Attending Physician: Orene Desanctis [id number]  Estimated length of stay: past midnight tomorrow  Certification:: I certify this patient will need inpatient services for at least 2 midnights       B Medical/Surgery History Past Medical History:  Diagnosis Date  . Alzheimer's dementia without behavioral disturbance ([place])    diagnosed 12/2015; Dr. Jade/neurology  . Anemia   . Asthma   . Blood transfusion 05-24-12  . Blood transfusion without reported diagnosis   . Chronic kidney disease    dr Clover Mealy  . Diabetes mellitus   . Diverticulosis   . DVT (deep venous thrombosis) ([place]) 03/2005   hx.left leg  . Esophageal stricture   . GERD (gastroesophageal reflux disease)   . Glaucoma   . Hiatal hernia 05/07/13  . Hyperlipidemia   . Hypertension   . Osteoarthritis   .  Pancreatic duct dilated 05/07/13  . Pulmonary nodules 05/07/13  . PVD (peripheral vascular disease) ([place])   . Tubular adenoma of colon    Past Surgical History:  Procedure Laterality Date  . CATARACT EXTRACTION, BILATERAL    . COLONOSCOPY  05/27/2012   Procedure: COLONOSCOPY;  Surgeon: Lafayette Dragon, MD;  Location: [facility];  Service: Endoscopy;  Laterality: N/A;  . ESOPHAGOGASTRODUODENOSCOPY  05/26/2012   Procedure: ESOPHAGOGASTRODUODENOSCOPY (EGD);  Surgeon: Lafayette Dragon, MD;  Location: [facility];  Service: Endoscopy;  Laterality: N/A;  . EUS N/A 07/08/2013   Procedure: UPPER ENDOSCOPIC ULTRASOUND (EUS) LINEAR;  Surgeon: Milus Banister, MD;  Location: [facility];  Service: Endoscopy;  Laterality: N/A;  need side view scope  . EYE SURGERY    . NM MYOVIEW LTD  10/2016   Read as intermediate risk due to EF of 38%.Anemia or infarction noted. Only abnormal septal WM.   Marland Kitchen TRANSTHORACIC ECHOCARDIOGRAM  10/2016    EF 45-50% with possible inferior hypokinesis. Aortic sclerosis without stenosis (ordered to reassess EF from Myoview)  . VASCULAR SURGERY  09/2004  . WRIST FRACTURE SURGERY     R wrist fracture, plate     A IV Location/Drains/Wounds Patient Lines/Drains/Airways Status   Active Line/Drains/Airways    Name:   Placement date:   Placement time:   Site:   Days:   Peripheral IV 10/23/19 Left;Distal Forearm   10/23/19    1458    Forearm   1   Peripheral IV 10/23/19 Right Forearm  10/23/19    1610    Forearm   1   External Urinary Catheter   10/23/19    1621    --   1          Intake/Output Last 24 hours  Intake/Output Summary (Last 24 hours) at 10/24/2019 1809 Last data filed at 10/24/2019 0300 Gross per 24 hour  Intake 1389 ml  Output --  Net 1389 ml    Labs/Imaging Results for orders placed or performed during the hospital encounter of 10/23/19 (from the past 48 hour(s))  Lactic acid, plasma     Status: None   Collection Time: 10/23/19  4:04 PM  Result Value Ref  Range   Lactic Acid, Venous 1.0 0.5 - 1.9 mmol/L    Comment: Performed at [facility], [place] [address] [place], [place] [address]  Blood Culture (routine x 2)     Status: None (Preliminary result)   Collection Time: 10/23/19  4:04 PM   Specimen: BLOOD RIGHT FOREARM  Result Value Ref Range   Specimen Description      BLOOD RIGHT FOREARM Performed at [facility] [address], [place], [place] [address]    Special Requests      BOTTLES DRAWN AEROBIC AND ANAEROBIC Blood Culture adequate volume Performed at [facility] [address] [place], [place] [address]    Culture      NO GROWTH < 12 HOURS Performed at [facility] [address], [place], [place] [address]    Report Status PENDING   Blood Culture (routine x 2)     Status: None (Preliminary result)   Collection Time: 10/23/19  4:04 PM   Specimen: BLOOD RIGHT FOREARM  Result Value Ref Range   Specimen Description      BLOOD RIGHT FOREARM Performed at [facility] [address], [place], [place] [address]    Special Requests      BOTTLES DRAWN AEROBIC AND ANAEROBIC Blood Culture results may not be optimal due to an inadequate volume of blood received in culture bottles Performed at [facility], [place] [address], [place], [place] [address]    Culture      NO GROWTH < 12 HOURS Performed at [facility] [address] [place], [place] [address]    Report Status PENDING   CBC WITH DIFFERENTIAL     Status: Abnormal   Collection Time: 10/23/19  4:04 PM  Result Value Ref Range   WBC 6.1 4.0 - 10.5 K/uL   RBC 4.04 3.87 - 5.11 MIL/uL   Hemoglobin 13.3 12.0 - 15.0 g/dL   HCT 41.7 36.0 - 46.0 %   MCV 103.2 (H) 80.0 - 100.0 fL   MCH 32.9 26.0 - 34.0 pg   MCHC 31.9 30.0 - 36.0 g/dL   RDW 12.1 11.5 - 15.5 %   Platelets 178 150 - 400 K/uL   nRBC 0.0 0.0 - 0.2 %   Neutrophils Relative % 59 %   Neutro Abs 3.6 1.7 -  7.7 K/uL   Lymphocytes Relative 30 %   Lymphs Abs 1.8 0.7 - 4.0 K/uL   Monocytes Relative 10 %   Monocytes Absolute 0.6 0.1 - 1.0 K/uL   Eosinophils Relative 1 %   Eosinophils Absolute 0.0 0.0 - 0.5 K/uL   Basophils Relative 0 %   Basophils Absolute 0.0 0.0 - 0.1 K/uL   Immature Granulocytes 0 %   Abs Immature Granulocytes  0.02 0.00 - 0.07 K/uL    Comment: Performed at [facility], [place] [address], [place], [place] [address]  Comprehensive metabolic panel     Status: Abnormal   Collection Time: 10/23/19  4:04 PM  Result Value Ref Range   Sodium 143 135 - 145 mmol/L   Potassium 3.7 3.5 - 5.1 mmol/L   Chloride 105 98 - 111 mmol/L   CO2 30 22 - 32 mmol/L   Glucose, Bld 144 (H) 70 - 99 mg/dL   BUN 19 8 - 23 mg/dL   Creatinine, Ser 0.81 0.44 - 1.00 mg/dL   Calcium 9.3 8.9 - 10.3 mg/dL   Total Protein 6.8 6.5 - 8.1 g/dL   Albumin 3.3 (L) 3.5 - 5.0 g/dL   AST 33 15 - 41 U/L   ALT 23 0 - 44 U/L   Alkaline Phosphatase 53 38 - 126 U/L   Total Bilirubin 0.5 0.3 - 1.2 mg/dL   GFR calc non Af Amer >60 >60 mL/min   GFR calc Af Amer >60 >60 mL/min   Anion gap 8 5 - 15    Comment: Performed at [facility], [place] [address], [place], [place] [address]  D-dimer, quantitative     Status: Abnormal   Collection Time: 10/23/19  4:04 PM  Result Value Ref Range   D-Dimer, Quant 3.90 (H) 0.00 - 0.50 ug/mL-FEU    Comment: (NOTE) At the manufacturer cut-off of 0.50 ug/mL FEU, this assay has been documented to exclude PE with a sensitivity and negative predictive value of 97 to 99%.  At this time, this assay has not been approved by the FDA to exclude DVT/VTE. Results should be correlated with clinical presentation. Performed at [facility], [place] [address] [place], [place] [address]   Procalcitonin     Status: None   Collection Time: 10/23/19  4:04 PM  Result Value Ref Range   Procalcitonin 0.10 ng/mL    Comment:         Interpretation: PCT (Procalcitonin) <= 0.5 ng/mL: Systemic infection (sepsis) is not likely. Local bacterial infection is possible. (NOTE)       Sepsis PCT Algorithm           Lower Respiratory Tract                                      Infection PCT Algorithm    ----------------------------     ----------------------------         PCT < 0.25 ng/mL                PCT < 0.10 ng/mL         Strongly encourage             Strongly discourage   discontinuation of antibiotics    initiation of antibiotics    ----------------------------     -----------------------------       PCT 0.25 - 0.50 ng/mL            PCT 0.10 - 0.25 ng/mL               OR       >80% decrease in PCT            Discourage initiation of  antibiotics      Encourage discontinuation           of antibiotics    ----------------------------     -----------------------------         PCT >= 0.50 ng/mL              PCT 0.26 - 0.50 ng/mL               AND        <80% decrease in PCT             Encourage initiation of                                             antibiotics       Encourage continuation           of antibiotics    ----------------------------     -----------------------------        PCT >= 0.50 ng/mL                  PCT > 0.50 ng/mL               AND         increase in PCT                  Strongly encourage                                      initiation of antibiotics    Strongly encourage escalation           of antibiotics                                     -----------------------------                                           PCT <= 0.25 ng/mL                                                 OR                                        > 80% decrease in PCT                                     Discontinue / Do not initiate                                             antibiotics Performed at [facility] [address] [place], [place]  [address]   Lactate dehydrogenase     Status: None   Collection Time: 10/23/19  4:04 PM  Result Value Ref Range   LDH 124 98 - 192 U/L    Comment: Performed at [facility], [place] [address] [place], [place] [address]  Ferritin     Status: Abnormal   Collection Time: 10/23/19  4:04 PM  Result Value Ref Range   Ferritin 592 (H) 11 - 307 ng/mL    Comment: Performed at [facility], [place] [address] [place], [place] [address]  Triglycerides     Status: None   Collection Time: 10/23/19  4:04 PM  Result Value Ref Range   Triglycerides 87 <150 mg/dL    Comment: Performed at [facility], [place] [address] [place], [place] [address]  Fibrinogen     Status: Abnormal   Collection Time: 10/23/19  4:04 PM  Result Value Ref Range   Fibrinogen 493 (H) 210 - 475 mg/dL    Comment: Performed at [facility], [place] [address], [place], [place] [address]  C-reactive protein     Status: Abnormal   Collection Time: 10/23/19  4:04 PM  Result Value Ref Range   CRP 2.0 (H) <1.0 mg/dL    Comment: Performed at [facility], [place] [address] [place], [place] [address]  Urinalysis, Routine w reflex microscopic     Status: Abnormal   Collection Time: 10/23/19  4:04 PM  Result Value Ref Range   Color, Urine YELLOW YELLOW   APPearance CLEAR CLEAR   Specific Gravity, Urine 1.020 1.005 - 1.030   pH 5.0 5.0 - 8.0   Glucose, UA 150 (A) NEGATIVE mg/dL   Hgb urine dipstick NEGATIVE NEGATIVE   Bilirubin Urine NEGATIVE NEGATIVE   Ketones, ur 5 (A) NEGATIVE mg/dL   Protein, ur NEGATIVE NEGATIVE mg/dL   Nitrite NEGATIVE NEGATIVE   Leukocytes,Ua TRACE (A) NEGATIVE   RBC / HPF 0-5 0 - 5 RBC/hpf   WBC, UA 0-5 0 - 5 WBC/hpf   Bacteria, UA NONE SEEN NONE SEEN   Squamous Epithelial / LPF 0-5 0 - 5   Hyaline Casts, UA PRESENT     Comment: Performed at [facility], [place] [address], [place], [place] [address]  Lactic  acid, plasma     Status: None   Collection Time: 10/23/19  9:00 PM  Result Value Ref Range   Lactic Acid, Venous 1.1 0.5 - 1.9 mmol/L    Comment: Performed at [facility], [place] [address], [place], [place] [address]  ABO/Rh     Status: None   Collection Time: 10/23/19  9:15 PM  Result Value Ref Range   ABO/RH(D) O POS   CBC with Differential/Platelet     Status: Abnormal   Collection Time: 10/24/19  5:15 AM  Result Value Ref Range   WBC 4.9 4.0 - 10.5 K/uL   RBC 3.83 (L) 3.87 - 5.11 MIL/uL   Hemoglobin 12.7 12.0 - 15.0 g/dL   HCT 38.8 36.0 - 46.0 %   MCV 101.3 (H) 80.0 - 100.0 fL   MCH 33.2 26.0 - 34.0 pg   MCHC 32.7 30.0 - 36.0 g/dL   RDW 12.1 11.5 - 15.5 %   Platelets 186 150 - 400 K/uL   nRBC 0.0 0.0 - 0.2 %   Neutrophils Relative % 76 %   Neutro Abs 3.7 1.7 - 7.7 K/uL   Lymphocytes Relative 20 %   Lymphs Abs 1.0 0.7 - 4.0 K/uL   Monocytes Relative 3 %   Monocytes Absolute  0.1 0.1 - 1.0 K/uL   Eosinophils Relative 0 %   Eosinophils Absolute 0.0 0.0 - 0.5 K/uL   Basophils Relative 0 %   Basophils Absolute 0.0 0.0 - 0.1 K/uL   Immature Granulocytes 1 %   Abs Immature Granulocytes 0.03 0.00 - 0.07 K/uL    Comment: Performed at [facility], [place] [address] [place], [place] [address]  D-dimer, quantitative (not at [facility])     Status: Abnormal   Collection Time: 10/24/19  5:15 AM  Result Value Ref Range   D-Dimer, Quant 2.88 (H) 0.00 - 0.50 ug/mL-FEU    Comment: (NOTE) At the manufacturer cut-off of 0.50 ug/mL FEU, this assay has been documented to exclude PE with a sensitivity and negative predictive value of 97 to 99%.  At this time, this assay has not been approved by the FDA to exclude DVT/VTE. Results should be correlated with clinical presentation. Performed at [facility], [place] [address], [place], [place] [address]   Hemoglobin A1c     Status: Abnormal   Collection Time: 10/24/19  5:15 AM  Result Value Ref  Range   Hgb A1c MFr Bld 6.9 (H) 4.8 - 5.6 %    Comment: (NOTE) Pre diabetes:          5.7%-6.4% Diabetes:              >6.4% Glycemic control for   <7.0% adults with diabetes    Mean Plasma Glucose 151.33 mg/dL    Comment: Performed at [place] [address], [place], [place] [address]  CBG monitoring, ED     Status: Abnormal   Collection Time: 10/24/19  7:38 AM  Result Value Ref Range   Glucose-Capillary 219 (H) 70 - 99 mg/dL  CBG monitoring, ED     Status: Abnormal   Collection Time: 10/24/19 11:43 AM  Result Value Ref Range   Glucose-Capillary 256 (H) 70 - 99 mg/dL  C-reactive protein     Status: Abnormal   Collection Time: 10/24/19 12:00 PM  Result Value Ref Range   CRP 1.6 (H) <1.0 mg/dL    Comment: Performed at [facility], [place] [address], [place], [place] [address]  Ferritin (Iron Binding Protein)     Status: Abnormal   Collection Time: 10/24/19 12:00 PM  Result Value Ref Range   Ferritin 591 (H) 11 - 307 ng/mL    Comment: Performed at [facility], [place] [address] [place], [place] [address]  CBG monitoring, ED     Status: Abnormal   Collection Time: 10/24/19  5:33 PM  Result Value Ref Range   Glucose-Capillary 209 (H) 70 - 99 mg/dL   DG Chest Port 1 View  Result Date: 10/23/2019 CLINICAL DATA:  COVID positive EXAM: PORTABLE CHEST 1 VIEW COMPARISON:  October 20, 2019 FINDINGS: Again noted are findings of COPD. There is no pneumothorax. There may be a small left-sided pleural effusion. The heart size is stable from prior study. Aortic calcifications are noted. There is no acute osseous abnormality detected. IMPRESSION: 1. Stable appearance of the chest with a persistent small left-sided pleural effusion. 2. COPD. 3. Aortic calcifications are again noted. Electronically Signed   By: Constance Holster M.D.   On: 10/23/2019 15:42    Pending Labs Unresulted Labs (From admission, onward)    Start     Ordered   10/24/19 0500   CBC with Differential/Platelet  Daily,   R     10/23/19  1941   10/24/19 0500  Comprehensive metabolic panel  Daily,   R     10/23/19 1941   10/24/19 0500  C-reactive protein  Daily,   R     10/23/19 1941   10/24/19 0500  Ferritin  Daily,   R     10/23/19 1941   10/24/19 0500  D-dimer, quantitative (not at [facility])  Daily,   R     10/23/19 1941   10/23/19 1519  Urine culture  ONCE - STAT,   STAT     10/23/19 1519          Vitals/Pain Today's Vitals   10/24/19 1330 10/24/19 1345 10/24/19 1400 10/24/19 1430  BP: (!) 120/57  (!) 124/55 116/71  Pulse: (!) 54 (!) 54 (!) 53 (!) 53  Resp: 20 16 (!) 24 (!) 23  Temp:      TempSrc:      SpO2: 97% 97% 97% 96%  Weight:      Height:        Isolation Precautions Airborne and Contact precautions  Medications Medications  enoxaparin (LOVENOX) injection 40 mg (40 mg Subcutaneous Given 10/23/19 2114)  dexamethasone (DECADRON) injection 6 mg (6 mg Intravenous Given 10/24/19 0918)  cefTRIAXone (ROCEPHIN) 1 g in sodium chloride 0.9 % 100 mL IVPB (0 g Intravenous Stopped 10/23/19 2127)  remdesivir 200 mg in sodium chloride 0.9% 250 mL IVPB (0 mg Intravenous Stopped 10/23/19 2146)    Followed by  remdesivir 100 mg in sodium chloride 0.9 % 100 mL IVPB (0 mg Intravenous Stopped 10/24/19 1000)  aspirin EC tablet 81 mg (81 mg Oral Given 10/24/19 0929)  citalopram (CELEXA) tablet 10 mg (10 mg Oral Given 10/24/19 0929)  donepezil (ARICEPT) tablet 10 mg (has no administration in time range)  memantine (NAMENDA) tablet 10 mg (10 mg Oral Given 10/24/19 0929)  ferrous sulfate tablet 325 mg (325 mg Oral Given 10/24/19 0930)  brimonidine (ALPHAGAN) 0.15 % ophthalmic solution 1 drop (1 drop Both Eyes Given 10/24/19 0931)  insulin aspart (novoLOG) injection 0-9 Units (3 Units Subcutaneous Given 10/24/19 1805)  hydrALAZINE (APRESOLINE) injection 5 mg (has no administration in time range)  budesonide (PULMICORT) 180 MCG/ACT inhaler 1 puff (has no administration in time range)   dexamethasone (DECADRON) injection 8 mg (8 mg Intravenous Given 10/23/19 1619)  albuterol (VENTOLIN HFA) 108 (90 Base) MCG/ACT inhaler 2 puff (2 puffs Inhalation Given 10/23/19 1618)  0.9 %  sodium chloride infusion ( Intravenous Stopped 10/24/19 0300)    Mobility walks with device Moderate fall risk   Focused Assessments .   R Recommendations: See Admitting Provider Note  Report given to:   Additional Notes: n/a

## 2019-10-24 NOTE — Progress Notes (Signed)
Pt arrived to unit. Transferred into bed. Bed in low position with bed alarm on. Call light within reach. Educated pt on call light system and bed controls. Cardiac monitoring applied. Pt on room air with SpO2: 99%. VSS with bradycardia on the monitor. Pt alert to person, date of birth and location. Confusion on situation and time. Preventative foam applied to coccyx. Incontinent of bowel and bladder at times, external catheter applied as well as barrier cream to skin. Currently clean and dry. Pt is a poor historian and appears restless and anxious. Report given to night RN.

## 2019-10-24 NOTE — ED Notes (Addendum)
Pt. Received a breakfast tray. Pt. Daughter will assist the pt. With her breakfast. Nurse aware.

## 2019-10-24 NOTE — Progress Notes (Signed)
PROGRESS NOTE    [name]  [id number] DOB: 05/01/1931 DOA: 10/23/2019 PCP: Marin Olp, MD   Brief Narrative: As per admitting " 86 y.o. female with medical history significant of dementia, asthma, CAD, hypertension, type 2 diabetes and history of DVT who presents with concerns of increased lethargy and decreased oxygen saturation.  History is provided by daughter, [name], at bedside due to patient's lethargy and altered mental status. Patient was recently evaluated at [facility] ED for altered mental status and was found to have a UTI and also incidentally found to be COVID positive.  There was initial plans for admission but after about 16 hours of boarding in the ED patient had improved with IV Rocephin and fluids so family decided to take her home with close monitoring.  Since then she has been on cefdinir but daughter continues to notice gradual decline in her mental status as well as increased weakness.  Normally she is able to ambulate from her chair to the bathroom with a walker and minimal assist, but she has been unable to do this for the past few days.  It has been difficult even to feed her due to her lethargy. Today, daughter also noticed that her oxygen appeared to be low and decided to call EMS. She denies patient complaining of any pain.  No fever, cough but she does have a chronic runny nose.  No nausea, vomiting or diarrhea.  ED Course: She had a temperature of 100, hypertensive up to systolic of 0000000 on room air. WBC of 6.1, hemoglobin of 13.3. Glucose of 144. Lactic acid of 1.0. CPR of 2.  LDH 124, triglyceride 87, ferritin of 592, procalcitonin of 0.10.  D-dimer 3.9, fibrinogen of 493. Chest x-ray showed stable appearance of the chest with persistent small left-sided pleural effusion 1/3: Respiratory status is stable, on room air this morning, overall patient feeling much improved per daughter at the bedside  Subjective:  Alert,awake, oriented to  place, people, On RA, not in distress, daughter at bedside " I want to go home"  Assessment & Plan:  Acute metabolic encephalopathy secondary to Covid infection and questionable UTI: Mental status seems to be improving.  We will continue on subtraction, supportive care, fall precaution.  Obtain PT eval.  Acute hypoxic respiratory failure due to COVID-19 infection: Chest x-ray with a stable appearance of the chest with persistent small left-sided pleural effusion.  Continue on Decadron and remdesivir supportive care.  Currently off oxygen on room air yesterday was on 2 L nasal cannula.  Monitor inflammatory markers as below Recent Labs    10/23/19 1604 10/24/19 0515 10/24/19 1200  DDIMER 3.90* 2.88*  --   FERRITIN 592*  --  591*  LDH 124  --   --   CRP 2.0*  --  1.6*   Lab Results  Component Value Date   SARSCOV2NAA POSITIVE (A) 10/20/2019    Diabetes Mellitus: blood sugar poorly controlled in 50s.Hemoglobin A1c 6.9.Continue sliding scale insulin,while she is on a steroid.  Asthma, chronic: Blood pressure stable continue current inhaler, supportive measures.  Alzheimer's disease: Patient is alert and oriented to self and family at baseline.  Status seems to overall much better.  Continue supportive care continue home Aricept Namenda and Celexa. Daughter at bedside providing care.  Suspected UTI: on 1/2: UA Trace leukocyte esterase negative nitrate WBC 0-5. Cont ceftriaxone, follow-up urine culture-still in process.  Blood culture is negative so far.  wbc normal and patient is afebrile, procalcitonin  mild + at 0.1  Asymptomatic bradycardia: monitoring in telemetry.  Essential hypertension: Pressure fairly controlled.  Not on home meds continue as needed hydralazine.  Body mass index is 18.5 kg/m.    DVT prophylaxis:lovenox Code Status: FULL CODE Family Communication: plan of care discussed with patient and daughter at bedside who thinks patient is 75% better. Obtain PT eval.  Spoke w Rn. Disposition Plan: Remains inpatient pending clinical improvement. Daughter asking whether patient will be able to go home and continue outpatient remdesivir once discahrged.  Patient's daughter declined for transfer to [facility] and would like to stay at [facility] long.She is still in ER awaiting bed.  Consultants: none Procedures:none Microbiology: Urine culture pending.  Blood cultures negative so far.  Antimicrobials: Anti-infectives (From admission, onward)   Start     Dose/Rate Route Frequency Ordered Stop   10/24/19 1000  remdesivir 100 mg in sodium chloride 0.9 % 100 mL IVPB     100 mg 200 mL/hr over 30 Minutes Intravenous Daily 10/23/19 1944 10/28/19 0959   10/23/19 2100  remdesivir 200 mg in sodium chloride 0.9% 250 mL IVPB     200 mg 580 mL/hr over 30 Minutes Intravenous Once 10/23/19 1944 10/23/19 2146   10/23/19 2000  cefTRIAXone (ROCEPHIN) 1 g in sodium chloride 0.9 % 100 mL IVPB     1 g 200 mL/hr over 30 Minutes Intravenous Every 24 hours 10/23/19 1943         Objective: Vitals:   10/24/19 0715 10/24/19 0730 10/24/19 0800 10/24/19 0815  BP:  (!) 151/62 (!) 156/63   Pulse: (!) 46 (!) 49 (!) 49 (!) 52  Resp: 14 14 20 18   Temp:      TempSrc:      SpO2: 94% 95% 97% 98%  Weight:      Height:        Intake/Output Summary (Last 24 hours) at 10/24/2019 0919 Last data filed at 10/24/2019 0300 Gross per 24 hour  Intake 1389 ml  Output --  Net 1389 ml   Filed Weights   10/23/19 1507  Weight: (S) 52 kg   Weight change:   Body mass index is 18.5 kg/m.  Intake/Output from previous day: 01/02 0701 - 01/03 0700 In: 1389 [I.V.:999; IV Piggyback:390] Out: -  Intake/Output this shift: No intake/output data recorded.  Examination:  General exam: AAO to place, people, weak HEENT:Oral mucosa moist, Ear/Nose WNL grossly, dentition normal. Respiratory system: Diminished at the base,no wheezing or crackles,no use of accessory muscle Cardiovascular system: S1 & S2  +, No JVD,. Gastrointestinal system: Abdomen soft, NT,ND, BS+ Nervous System:Alert, awake, moving extremities and grossly nonfocal Extremities: No edema, distal peripheral pulses palpable.  Skin: No rashes,no icterus. MSK: Normal muscle bulk,tone, power  Medications:  Scheduled Meds: . aspirin EC  81 mg Oral Daily  . brimonidine  1 drop Both Eyes BID  . budesonide  0.25 mg Nebulization QHS  . citalopram  10 mg Oral Daily  . dexamethasone (DECADRON) injection  6 mg Intravenous Q24H  . donepezil  10 mg Oral QHS  . enoxaparin (LOVENOX) injection  40 mg Subcutaneous Q24H  . ferrous sulfate  325 mg Oral Q breakfast  . insulin aspart  0-9 Units Subcutaneous TID WC  . memantine  10 mg Oral BID   Continuous Infusions: . cefTRIAXone (ROCEPHIN)  IV Stopped (10/23/19 2127)  . remdesivir 100 mg in NS 100 mL      Data Reviewed: I have personally reviewed following labs and imaging studies  CBC: Recent Labs  Lab 10/20/19 1610 10/23/19 1604 10/24/19 0515  WBC 8.7 6.1 4.9  NEUTROABS 5.5 3.6 3.7  HGB 13.9 13.3 12.7  HCT 42.5 41.7 38.8  MCV 101.4* 103.2* 101.3*  PLT 234 178 [id number]   Basic Metabolic Panel: Recent Labs  Lab 10/20/19 1610 10/23/19 1604  NA 135 143  K 4.1 3.7  CL 102 105  CO2 27 30  GLUCOSE 162* 144*  BUN 24* 19  CREATININE 1.01* 0.81  CALCIUM 9.7 9.3   GFR: Estimated Creatinine Clearance: 40.9 mL/min (by C-G formula based on SCr of 0.81 mg/dL). Liver Function Tests: Recent Labs  Lab 10/20/19 1610 10/23/19 1604  AST 31 33  ALT 24 23  ALKPHOS 56 53  BILITOT 0.5 0.5  PROT 6.8 6.8  ALBUMIN 3.3* 3.3*   No results for input(s): LIPASE, AMYLASE in the last 168 hours. No results for input(s): AMMONIA in the last 168 hours. Coagulation Profile: No results for input(s): INR, PROTIME in the last 168 hours. Cardiac Enzymes: No results for input(s): CKTOTAL, CKMB, CKMBINDEX, TROPONINI in the last 168 hours. BNP (last 3 results) No results for input(s): PROBNP  in the last 8760 hours. HbA1C: Recent Labs    10/24/19 0515  HGBA1C 6.9*   CBG: Recent Labs  Lab 10/20/19 2220 10/21/19 0611 10/24/19 0738  GLUCAP 103* 103* 219*   Lipid Profile: Recent Labs    10/23/19 1604  TRIG 87   Thyroid Function Tests: No results for input(s): TSH, T4TOTAL, FREET4, T3FREE, THYROIDAB in the last 72 hours. Anemia Panel: Recent Labs    10/23/19 [id number]*   Sepsis Labs: Recent Labs  Lab 10/20/19 1610 10/23/19 1604 10/23/19 2100  PROCALCITON  --  0.10  --   LATICACIDVEN 1.1 1.0 1.1    Recent Results (from the past 240 hour(s))  Culture, blood (routine x 2)     Status: None (Preliminary result)   Collection Time: 10/20/19  4:10 PM   Specimen: BLOOD  Result Value Ref Range Status   Specimen Description   Final    BLOOD BLOOD RIGHT ARM Performed at [facility], [address] [place], [name] [address]    Special Requests   Final    BOTTLES DRAWN AEROBIC AND ANAEROBIC Blood Culture adequate volume Performed at [facility], [address] [place], [place] [address]    Culture   Final    NO GROWTH 4 DAYS Performed at [facility], [place] [address], [place], [place] [address]    Report Status PENDING  Incomplete  Urine culture     Status: Abnormal   Collection Time: 10/20/19  5:15 PM   Specimen: In/Out Cath Urine  Result Value Ref Range Status   Specimen Description   Final    IN/OUT CATH URINE Performed at [facility], [address] [place], [place] [address]    Special Requests   Final    NONE Performed at [facility], [address] [place], [place] [address]    Culture MULTIPLE SPECIES PRESENT, SUGGEST RECOLLECTION (A)  Final   Report Status 10/21/2019 FINAL  Final  SARS CORONAVIRUS 2 (TAT 6-24 HRS) Nasopharyngeal Nasopharyngeal Swab     Status: Abnormal   Collection Time: 10/20/19  8:35 PM   Specimen: Nasopharyngeal Swab  Result Value Ref Range Status   SARS  Coronavirus 2 POSITIVE (A) NEGATIVE Final    Comment: RESULT CALLED TO, READ  BACK BY AND VERIFIED WITH: Cassell Smiles 0244 10/21/2019 T. TYSOR (NOTE) SARS-CoV-2 target nucleic acids are DETECTED. The SARS-CoV-2 RNA is generally detectable in upper and lower respiratory specimens during the acute phase of infection. Positive results are indicative of the presence of SARS-CoV-2 RNA. Clinical correlation with patient history and other diagnostic information is  necessary to determine patient infection status. Positive results do not rule out bacterial infection or co-infection with other viruses.  The expected result is Negative. Fact Sheet for Patients: SugarRoll.be Fact Sheet for Healthcare Providers: https://www.woods-mathews.com/ This test is not yet approved or cleared by the [place] FDA and  has been authorized for detection and/or diagnosis of SARS-CoV-2 by FDA under an Emergency Use Authorization (EUA). This EUA will remain  in effect (meaning this test can be used) for t he duration of the COVID-19 declaration under Section 564(b)(1) of the Act, 21 U.S.C. section 360bbb-3(b)(1), unless the authorization is terminated or revoked sooner. Performed at [facility], [place] [address] [place], [place] [address]   Blood Culture (routine x 2)     Status: None (Preliminary result)   Collection Time: 10/23/19  4:04 PM   Specimen: BLOOD RIGHT FOREARM  Result Value Ref Range Status   Specimen Description   Final    BLOOD RIGHT FOREARM Performed at [facility] [address] [place], [place] [address]    Special Requests   Final    BOTTLES DRAWN AEROBIC AND ANAEROBIC Blood Culture adequate volume Performed at [facility] [address], [place], [place] [address]    Culture   Final    NO GROWTH < 12 HOURS Performed at [facility] [address], [place], [place] [address]    Report  Status PENDING  Incomplete  Blood Culture (routine x 2)     Status: None (Preliminary result)   Collection Time: 10/23/19  4:04 PM   Specimen: BLOOD RIGHT FOREARM  Result Value Ref Range Status   Specimen Description   Final    BLOOD RIGHT FOREARM Performed at [facility] [address], [place], [place] [address]    Special Requests   Final    BOTTLES DRAWN AEROBIC AND ANAEROBIC Blood Culture results may not be optimal due to an inadequate volume of blood received in culture bottles Performed at [facility] [address] [place], [place] [address]    Culture   Final    NO GROWTH < 12 HOURS Performed at [facility] [address], [place], [place] [address]    Report Status PENDING  Incomplete      Radiology Studies: DG Chest Port 1 View  Result Date: 10/23/2019 CLINICAL DATA:  COVID positive EXAM: PORTABLE CHEST 1 VIEW COMPARISON:  October 20, 2019 FINDINGS: Again noted are findings of COPD. There is no pneumothorax. There may be a small left-sided pleural effusion. The heart size is stable from prior study. Aortic calcifications are noted. There is no acute osseous abnormality detected. IMPRESSION: 1. Stable appearance of the chest with a persistent small left-sided pleural effusion. 2. COPD. 3. Aortic calcifications are again noted. Electronically Signed   By: Constance Holster M.D.   On: 10/23/2019 15:42     LOS: 1 day   Time spent: More than 50% of that time was spent in counseling and/or coordination of care.  Antonieta Pert, MD [facility]  10/24/2019, 9:19 AM

## 2019-10-25 ENCOUNTER — Telehealth: Payer: Self-pay | Admitting: Family Medicine

## 2019-10-25 LAB — COMPREHENSIVE METABOLIC PANEL
ALT: 20 U/L (ref 0–44)
AST: 31 U/L (ref 15–41)
Albumin: 2.8 g/dL — ABNORMAL LOW (ref 3.5–5.0)
Alkaline Phosphatase: 42 U/L (ref 38–126)
Anion gap: 8 (ref 5–15)
BUN: 25 mg/dL — ABNORMAL HIGH (ref 8–23)
CO2: 26 mmol/L (ref 22–32)
Calcium: 8.7 mg/dL — ABNORMAL LOW (ref 8.9–10.3)
Chloride: 105 mmol/L (ref 98–111)
Creatinine, Ser: 0.62 mg/dL (ref 0.44–1.00)
GFR calc Af Amer: >60 mL/min (ref >60)
GFR calc non Af Amer: >60 mL/min (ref >60)
Glucose, Bld: 117 mg/dL — ABNORMAL HIGH (ref 70–99)
Potassium: 4.3 mmol/L (ref 3.5–5.1)
Sodium: 139 mmol/L (ref 135–145)
Total Bilirubin: 0.3 mg/dL (ref 0.3–1.2)
Total Protein: 5.7 g/dL — ABNORMAL LOW (ref 6.5–8.1)

## 2019-10-25 LAB — CBC WITH DIFFERENTIAL/PLATELET
Abs Immature Granulocytes: 0.09 10*3/uL — ABNORMAL HIGH (ref 0.00–0.07)
Basophils Absolute: 0 10*3/uL (ref 0.0–0.1)
Basophils Relative: 0 %
Eosinophils Absolute: 0 10*3/uL (ref 0.0–0.5)
Eosinophils Relative: 0 %
HCT: 38.3 % (ref 36.0–46.0)
Hemoglobin: 12.6 g/dL (ref 12.0–15.0)
Immature Granulocytes: 1 %
Lymphocytes Relative: 14 %
Lymphs Abs: 1.8 10*3/uL (ref 0.7–4.0)
MCH: 33.2 pg (ref 26.0–34.0)
MCHC: 32.9 g/dL (ref 30.0–36.0)
MCV: 101.1 fL — ABNORMAL HIGH (ref 80.0–100.0)
Monocytes Absolute: 0.8 10*3/uL (ref 0.1–1.0)
Monocytes Relative: 7 %
Neutro Abs: 9.7 10*3/uL — ABNORMAL HIGH (ref 1.7–7.7)
Neutrophils Relative %: 78 %
Platelets: 193 10*3/uL (ref 150–400)
RBC: 3.79 MIL/uL — ABNORMAL LOW (ref 3.87–5.11)
RDW: 12 % (ref 11.5–15.5)
WBC: 12.4 10*3/uL — ABNORMAL HIGH (ref 4.0–10.5)
nRBC: 0 % (ref 0.0–0.2)

## 2019-10-25 LAB — GLUCOSE, CAPILLARY
Glucose-Capillary: 111 mg/dL — ABNORMAL HIGH (ref 70–99)
Glucose-Capillary: 147 mg/dL — ABNORMAL HIGH (ref 70–99)
Glucose-Capillary: 270 mg/dL — ABNORMAL HIGH (ref 70–99)
Glucose-Capillary: 193 mg/dL — ABNORMAL HIGH (ref 70–99)

## 2019-10-25 LAB — D-DIMER, QUANTITATIVE: D-Dimer, Quant: 2.97 ug/mL-FEU — ABNORMAL HIGH (ref 0.00–0.50)

## 2019-10-25 LAB — URINE CULTURE: Culture: NO GROWTH

## 2019-10-25 LAB — CULTURE, BLOOD (ROUTINE X 2)
Culture: NO GROWTH
Special Requests: ADEQUATE

## 2019-10-25 LAB — FERRITIN: Ferritin: 636 ng/mL — ABNORMAL HIGH (ref 11–307)

## 2019-10-25 LAB — C-REACTIVE PROTEIN: CRP: 1 mg/dL — ABNORMAL HIGH (ref <1.0)

## 2019-10-25 MED ORDER — AMOXICILLIN-POT CLAVULANATE 875-125 MG PO TABS
1.0000 | ORAL_TABLET | Freq: Two times a day (BID) | ORAL | Status: DC
  Administered 2019-10-25 – 2019-10-26 (×3): 1 via ORAL
  Filled 2019-10-25 (×3): qty 1

## 2019-10-25 NOTE — Progress Notes (Signed)
    Durable Medical Equipment  (From admission, onward)         Start     Ordered   10/25/19 1449  For home use only DME Hospital bed  Once    Question Answer Comment  Length of Need Lifetime   Patient has (list medical condition): COVID 19, DEMENTIA, ASTHMA, DVT,   The above medical condition requires: Patient requires the ability to reposition frequently   Head must be elevated greater than: 45 degrees   Bed type Semi-electric   Support Surface: Low Air loss Mattress      10/25/19 1450

## 2019-10-25 NOTE — Telephone Encounter (Signed)
[place] [facility] [place] RECORD [facility] Patient Name: [name] Gender: Female DOB: 07-Aug-1931 Age: 86 Y 67 M 29 D Return Phone Number: [phone] (Primary), [phone] (Secondary) Address: City/State/Zip: [place] [address] Client [facility] Client Site [facility] [place] Night Physician Garret Reddish- MD Contact Type Call Who Is Calling Patient / Member / Family / Caregiver Call Type Triage / Clinical Caller Name Margeurite Gasparian Relationship To Patient Daughter Return Phone Number [phone] (Primary) Chief Complaint Health information question (non symptomatic) Reason for Call Symptomatic / Request for [place] states that she is wanting some support for her mother who was in the ER with a UTI and COVID from Wednesday until yesterday. She is wanting some out patient options for Monoclonal IV treatment she was prescribed. Translation No Nurse Assessment Nurse: Enid Derry, RN, Manuela Schwartz Date/Time Eilene Ghazi Time): 10/22/2019 1:19:09 PM Confirm and document reason for call. If symptomatic, describe symptoms. ---Caller states that she is wanting some support for her mother who was in the ER with a UTI and COVID from Wednesday until yesterday. She is requesting Monoclonal antibody IV treatment for her mother. She could not be admitted to [facility] due to the Covid diagnosis, and the daughters wanted to be with her. Daughter states that mother is non-communicative. Patient also has dementia and cannot advocate for herself. So they brought her home and gave her oral antibiotics. This morning is extremely lethargic and not verbal. Has the patient had close contact with a person known or suspected to have the novel coronavirus illness OR traveled / lives in area with major community spread (including international travel) in the last 14 days  from the onset of symptoms? * If Asymptomatic, screen for exposure and travel within the last 14 days. ---Yes Does the patient have any new or worsening symptoms? ---Yes Will a triage be completed? ---Yes Related visit to physician within the last 2 weeks? ---Yes Does the PT have any chronic conditions? (i.e. diabetes, asthma, this includes High risk factors for pregnancy, etc.) ---Yes List chronic conditions. ---dementia; diabetes, diet-controlled; Is this a behavioral health or substance abuse call? ---No PLEASE NOTE: All timestamps contained within this report are represented as [place] Standard Time. CONFIDENTIALTY NOTICE: This fax transmission is intended only for the addressee. It contains information that is legally privileged, confidential or otherwise protected from use or disclosure. If you are not the intended recipient, you are strictly prohibited from reviewing, disclosing, copying using or disseminating any of this information or taking any action in reliance on or regarding this information. If you have received this fax in error, please notify us immediately by telephone so that we can arrange for its return to [place]. Phone: [phone], Toll-Free: [phone], Fax: [phone] Page: 2 of 2 Call Id: [id number] Guidelines Guideline Title Affirmed Question Affirmed Notes Nurse Date/Time Eilene Ghazi Time) Coronavirus (COVID-19) - Diagnosed or Suspected Difficult to awaken or acting confused (e.g., disoriented, slurred speech) Enid Derry, RN, Manuela Schwartz [id number] 1:24:52 PM Disp. Time Eilene Ghazi Time) Disposition Final User 10/22/2019 1:36:42 PM 911 Outcome Documentation Enid Derry, RN, Manuela Schwartz Reason: Daughters state they are still focusing on keeping the patient at home and getting home health to administer IV monoclonal antibodies for Covid; they are not ready to commit to calling EMS 911 but will continue to discuss with all family members. They are upset that the option for this was  not given them at the ER  on Wednesday. Advised that until patient is evaluated by a physician, no one can prescribed the monoclonal antibodies at this point, and considering the patient has worsening symptoms since Wednesday, immediate medical attention is needed. They still did not commit to calling for EMS. 10/22/2019 1:33:40 PM Call EMS [id number] Now Yes Enid Derry, RN, Edwena Bunde Disagree/Comply Disagree Caller Understands Yes PreDisposition InappropriateToAsk Care Advice Given Per Guideline CALL EMS 911 NOW: * Immediate medical attention is needed. You need to hang up and call 911 (or an ambulance). TELL THE AMBULANCE DISPATCHER ABOUT COVID-19 DIAGNOSIS: * When you call 911, tell the dispatcher that you probably have COVID-19. TELL AMBULANCE MEDICS ABOUT YOUR COVID-19 DIAGNOSIS: * Tell the paramedic right away that you probably have COVID-19. * The paramedics should call ahead to the emergency department to let them know. CARE ADVICE given per CORONAVIRUS (COVID-19) - DIAGNOSED OR SUSPECTED (Adult) guideline. Daughter states they are trying to keep mother out of the hospital; they are discussing what to do. They are upset that one of the daughters cannot stay with her. They are still focusing on getting home health treatment.

## 2019-10-25 NOTE — Telephone Encounter (Signed)
FYI, pt in hospital

## 2019-10-25 NOTE — Telephone Encounter (Signed)
Noted-will look out for discharge summary

## 2019-10-25 NOTE — Evaluation (Signed)
Physical Therapy Evaluation Patient Details Name: [name] MRN: [id number] DOB: 14-Jan-1931 Today's Date: 10/25/2019   History of Present Illness  86 y.o. female with medical history significant of dementia, asthma, CAD, hypertension, type 2 diabetes and history of DVT who presents with concerns of increased lethargy and decreased oxygen saturation. Patient was recently evaluated for altered mental status and was found to have a UTI and also incidentally found to be COVID positive.  Clinical Impression  Pt admitted with above diagnosis.  Pt currently with functional limitations due to the deficits listed below (see PT Problem List). Pt will benefit from skilled PT to increase their independence and safety with mobility to allow discharge to the venue listed below.  Per chart she lives with daughter who A her with ambulation with RW short household distances. Today she required MOD/MAX A for SPT.  Recommend returning home with 24/7 A and HHPT if pt's family can A her at current level.  If not, then would need to consider SNF.  Attempted to reach family and called two different phone numbers, but unable to reach them to discuss pt's PLOF and d/c set-up.     Follow Up Recommendations Home health PT;Supervision/Assistance - 24 hour;Supervision for mobility/OOB    Equipment Recommendations  3in1 (PT)(if she doesn't have one already)    Recommendations for Other Services       Precautions / Restrictions Precautions Precautions: Fall Restrictions Weight Bearing Restrictions: No      Mobility  Bed Mobility Overal bed mobility: Needs Assistance Bed Mobility: Supine to Sit     Supine to sit: Mod assist;HOB elevated     General bed mobility comments: MIN A for legs and MOD A to get trunk upright.  Transfers Overall transfer level: Needs assistance Equipment used: Rolling walker (2 wheeled) Transfers: Sit to/from [facility] Sit to Stand: Mod assist Stand pivot  transfers: Mod assist;Max assist       General transfer comment: MOD A to power up and get COG over BOS with posterior tendency.  MOD/MAX for SPT with RW with weakness noted in LE and flexed knees and poor balance.  Ambulation/Gait             General Gait Details: SPT with MOD/MAX A and posterior lean  Stairs            Wheelchair Mobility    Modified Rankin (Stroke Patients Only)       Balance Overall balance assessment: Needs assistance Sitting-balance support: Feet supported Sitting balance-Leahy Scale: Fair       Standing balance-Leahy Scale: Zero Standing balance comment: requires UE support and tactile cueing to shift weight forward off of her heels.                             Pertinent Vitals/Pain Pain Assessment: No/denies pain    Home Living Family/patient expects to be discharged to:: Private residence Living Arrangements: Spouse/significant other Available Help at Discharge: Family Type of Home: House       Home Layout: Multi-level Home Equipment: Gilford Rile - 2 wheels Additional Comments: Pt is questionable historian. She states she lives with husband, but chart states daughter. Called 2 phone numbers and unable to clarify PLOF.    Prior Function Level of Independence: Needs assistance   Gait / Transfers Assistance Needed: per MD note, pt ambulated short distances with RW and daughter's help           Hand  Dominance        Extremity/Trunk Assessment   Upper Extremity Assessment Upper Extremity Assessment: Generalized weakness    Lower Extremity Assessment Lower Extremity Assessment: Generalized weakness;RLE deficits/detail;LLE deficits/detail RLE Deficits / Details: decreased knee extension LLE Deficits / Details: decreased knee extension       Communication   Communication: Other (comment)(soft spoken)  Cognition Arousal/Alertness: Awake/alert Behavior During Therapy: WFL for tasks assessed/performed Overall  Cognitive Status: Impaired/Different from baseline Area of Impairment: Safety/judgement;Problem solving                         Safety/Judgement: Decreased awareness of deficits   Problem Solving: Requires verbal cues;Requires tactile cues General Comments: per chart, she has dementia. She was oriented to location, self, and DOB.      General Comments      Exercises     Assessment/Plan    PT Assessment Patient needs continued PT services  PT Problem List Decreased strength;Decreased range of motion;Decreased activity tolerance;Decreased balance;Decreased mobility;Decreased cognition;Decreased knowledge of use of DME;Decreased safety awareness       PT Treatment Interventions DME instruction;Gait training;Functional mobility training;Neuromuscular re-education;Balance training;Therapeutic exercise;Therapeutic activities    PT Goals (Current goals can be found in the Care Plan section)  Acute Rehab PT Goals Patient Stated Goal: get out of the bed PT Goal Formulation: With patient Time For Goal Achievement: 11/08/19 Potential to Achieve Goals: Good    Frequency Min 3X/week   Barriers to discharge        Co-evaluation               AM-PAC PT "6 Clicks" Mobility  Outcome Measure Help needed turning from your back to your side while in a flat bed without using bedrails?: A Lot Help needed moving from lying on your back to sitting on the side of a flat bed without using bedrails?: A Lot Help needed moving to and from a bed to a chair (including a wheelchair)?: A Lot Help needed standing up from a chair using your arms (e.g., wheelchair or bedside chair)?: A Lot Help needed to walk in hospital room?: A Lot Help needed climbing 3-5 steps with a railing? : A Lot 6 Click Score: 12    End of Session Equipment Utilized During Treatment: Gait belt Activity Tolerance: Patient tolerated treatment well Patient left: in chair;with call bell/phone within reach;with  chair alarm set Nurse Communication: Mobility status PT Visit Diagnosis: Unsteadiness on feet (R26.81);Muscle weakness (generalized) (M62.81);Other abnormalities of gait and mobility (R26.89)    Time: [id number] PT Time Calculation (min) (ACUTE ONLY): 36 min   Charges:   PT Evaluation $PT Eval Moderate Complexity: 1 Mod PT Treatments $Therapeutic Activity: 8-22 mins        Josuha Fontanez L. Tamala Julian, [place] Pager [phone] 10/25/2019   Galen Manila 10/25/2019, 1:37 PM

## 2019-10-25 NOTE — Evaluation (Signed)
Clinical/Bedside Swallow Evaluation Patient Details  Name: [name] MRN: [id number] Date of Birth: 12/27/1930  Today's Date: 10/25/2019 Time: SLP Start Time (ACUTE ONLY): 1500 SLP Stop Time (ACUTE ONLY): [id number] SLP Time Calculation (min) (ACUTE ONLY): 35 min  Past Medical History:  Past Medical History:  Diagnosis Date  . Alzheimer's dementia without behavioral disturbance ([place])    diagnosed 12/2015; Dr. Jade/neurology  . Anemia   . Asthma   . Blood transfusion 05-24-12  . Blood transfusion without reported diagnosis   . Chronic kidney disease    dr Clover Mealy  . Diabetes mellitus   . Diverticulosis   . DVT (deep venous thrombosis) ([place]) 03/2005   hx.left leg  . Esophageal stricture   . GERD (gastroesophageal reflux disease)   . Glaucoma   . Hiatal hernia 05/07/13  . Hyperlipidemia   . Hypertension   . Osteoarthritis   . Pancreatic duct dilated 05/07/13  . Pulmonary nodules 05/07/13  . PVD (peripheral vascular disease) ([place])   . Tubular adenoma of colon    Past Surgical History:  Past Surgical History:  Procedure Laterality Date  . CATARACT EXTRACTION, BILATERAL    . COLONOSCOPY  05/27/2012   Procedure: COLONOSCOPY;  Surgeon: Lafayette Dragon, MD;  Location: [facility] ENDOSCOPY;  Service: Endoscopy;  Laterality: N/A;  . ESOPHAGOGASTRODUODENOSCOPY  05/26/2012   Procedure: ESOPHAGOGASTRODUODENOSCOPY (EGD);  Surgeon: Lafayette Dragon, MD;  Location: [facility];  Service: Endoscopy;  Laterality: N/A;  . EUS N/A 07/08/2013   Procedure: UPPER ENDOSCOPIC ULTRASOUND (EUS) LINEAR;  Surgeon: Milus Banister, MD;  Location: [facility]OSCOPY;  Service: Endoscopy;  Laterality: N/A;  need side view scope  . EYE SURGERY    . NM MYOVIEW LTD  10/2016   Read as intermediate risk due to EF of 38%.Anemia or infarction noted. Only abnormal septal WM.   Marland Kitchen TRANSTHORACIC ECHOCARDIOGRAM  10/2016    EF 45-50% with possible inferior hypokinesis. Aortic sclerosis without stenosis (ordered to reassess EF from Myoview)  .  VASCULAR SURGERY  09/2004  . WRIST FRACTURE SURGERY     R wrist fracture, plate   HPI:  [id number] female admitted 10/23/19 with increased lethargy and low O2 sats. PMH: dementia, anemia, asthma, CAD, HTN, DM2, DVT, Covid +, DM, Esophageal stricture, GERD, hiatal hernia.   Assessment / Plan / Recommendation Clinical Impression  Pt seen at bedside for assessment of swallow function and safety. Pt has adequate natural dentition, and does not report swallowing difficulties. She does have esophageal issues at baseline. Trials of thin liquid, puree, and solid textures were given, and were tolerated without overt s/s aspiration. However, dys 3 diet with chopped meats is recommended, due to cognitively based issues with attention and memory. Pt would benefit from assistance with po intake (likely more assistance will be required later in the day). Safe swallow precautions were left with RN to post at [facility] Results and recommendations were reviewed with RN. MD informed. SLP will continue to follow pt acutely to assess diet tolerance and provide continued education for safety.   SLP Visit Diagnosis: Dysphagia, unspecified (R13.10)    Aspiration Risk  Mild aspiration risk    Diet Recommendation Dysphagia 3 (Mech soft);Thin liquid   Liquid Administration via: Cup;Straw Medication Administration: Crushed with puree Supervision: Patient able to self feed;Full supervision/cueing for compensatory strategies;Staff to assist with self feeding Compensations: Slow rate;Small sips/bites;Minimize environmental distractions Postural Changes: Seated upright at 90 degrees;Remain upright for at least 30 minutes after po intake    Other  Recommendations  Oral Care Recommendations: Oral care BID   Follow up Recommendations 24 hour supervision/assistance      Frequency and Duration min 2x/week  1 week;2 weeks       Prognosis Prognosis for Safe Diet Advancement: Fair Barriers to Reach Goals: Cognitive deficits       Swallow Study   General Date of Onset: 10/23/19 HPI: 86yo female admitted 10/23/19 with increased lethargy and low O2 sats. PMH: dementia, anemia, asthma, CAD, HTN, DM2, DVT, Covid +, DM, Esophageal stricture, GERD, hiatal hernia. Type of Study: Bedside Swallow Evaluation Previous Swallow Assessment: none found Diet Prior to this Study: Regular;Thin liquids Temperature Spikes Noted: No Respiratory Status: Room air History of Recent Intubation: No Behavior/Cognition: Alert;Cooperative Oral Cavity Assessment: Within Functional Limits Oral Care Completed by SLP: No Oral Cavity - Dentition: Adequate natural dentition Vision: Functional for self-feeding Patient Positioning: Upright in chair Baseline Vocal Quality: Low vocal intensity Volitional Cough: Weak Volitional Swallow: Able to elicit    Oral/Motor/Sensory Function Overall Oral Motor/Sensory Function: Within functional limits   Ice Chips Ice chips: Not tested   Thin Liquid Thin Liquid: Within functional limits Presentation: Straw    Nectar Thick Nectar Thick Liquid: Not tested   Honey Thick Honey Thick Liquid: Not tested   Puree Puree: Within functional limits Presentation: Self Fed;Spoon   Solid     Solid: Within functional limits Presentation: [place], [place], [place] Pathologist Office: [phone] Pager: [phone]  Shonna Chock 10/25/2019,3:40 PM

## 2019-10-25 NOTE — Progress Notes (Signed)
PROGRESS NOTE    [name]  [id number] DOB: 1931-07-29 DOA: 10/23/2019 PCP: Marin Olp, MD   Brief Narrative:  As per admitting " 86 y.o.femalewith medical history significant ofdementia, asthma, CAD, hypertension, type 2 diabetes and history of DVT who presents with concerns of increased lethargy and decreased oxygen saturation. History is provided by daughter, [name], at bedside due to patient's lethargy and altered mental status. Patient was recently evaluated at [facility] ED for altered mental status and was found to have a UTI and also incidentally found to be COVIDpositive. There was initial plans for admission but after about 16 hours of boarding in the EDpatient had improved with IV Rocephin and fluids so family decided to take her home with close monitoring. Since then she has been on cefdinir but daughter continues to notice gradual decline in her mental status as well as increased weakness. Normally she is able to ambulate from her chair to the bathroom with a walker and minimal assist,but she has been unable to do thisfor the past few days. It has been difficult even to feed her due to her lethargy. Today, daughter also noticed that her oxygen appeared to be low and decided to call EMS. She denies patient complaining of any pain. No fever, cough but she does have a chronic runny nose. No nausea, vomiting or diarrhea.  ED Course:She had a temperature of 100, hypertensive up to systolic of 0000000 on room air. WBC of 6.1, hemoglobin of 13.3. Glucose of 144. Lactic acid of 1.0. CPR of 2.LDH 124, triglyceride 87, ferritin of 592, procalcitonin of 0.10. D-dimer 3.9, fibrinogen of 493. Chest x-ray showed stable appearance of the chest with persistent small left-sided pleural effusion 1/3: Respiratory status is stable, on room air this morning, overall patient feeling much improved per daughter at the bedside 1/4: patient hopeful to go home tomorrow,  will work to setpu outpatient remdesivir  Subjective: Alert to name only, awake, doing well. Per conversations with her daughter, this is her baseline. She is likely at her baseline. Plan for DC tomorrow  Assessment & Plan:   Principal Problem:   COVID-19 virus infection Active Problems:   Diabetes ([place])   Asthma, chronic   Alzheimer's disease ([place])   UTI (urinary tract infection)   Altered mental status   Essential hypertension   Bradycardia with 51-60 beats per minute  Acute metabolic encephalopathy secondary to Covid infection and questionable UTI: Mental status seems to be improving.  We will continue on subtraction, supportive care, fall precaution.  Obtain PT eval. Rec'd HH PT  Acute hypoxic respiratory failure due to COVID-19 infection: Chest x-ray with a stable appearance of the chest with persistent small left-sided pleural effusion.  Continue on Decadron and remdesivir supportive care.  Currently off oxygen on room  Diabetes Mellitus: blood sugar poorly controlled in 50s.Hemoglobin A1c 6.9.Continue sliding scale insulin,while she is on a steroid.  Asthma, chronic: Blood pressure stable continue current inhaler, supportive measures.  Alzheimer's disease: Patient is alert and oriented to self and family at baseline.  Status seems to overall much better.  Continue supportive care continue home Aricept Namenda and Celexa.   Suspected UTI: on 1/2: UA Trace leukocyte esterase negative nitrate WBC 0-5. Cont ceftriaxone, follow-up urine culture-still in process.  Blood culture is negative so far.  wbc normal and patient is afebrile, procalcitonin mild + at 0.1. Started on augmentin.   Asymptomatic bradycardia: monitoring in telemetry.  Essential hypertension: Pressure fairly controlled.  Not on  home meds continue as needed hydralazine.  Body mass index is 18.5 kg/m.    DVT prophylaxis:lovenox Code Status: FULL CODE Family Communication: plan of care discussed with  patient and daughter at bedside who thinks patient is 75% better.  Disposition Plan: Remains inpatient pending clinical improvement. Will try to setup remdesivir as outpatient tomorrow.   Consultants: none Procedures:none Microbiology: Urine culture pending.  Blood cultures negative so far.  Antimicrobials: Objective: Vitals:   10/24/19 2126 10/24/19 2157 10/25/19 0547 10/25/19 1628  BP:  (!) 165/66 (!) 170/67 (!) 175/69  Pulse: 60 (!) 52 (!) 48 (!) 52  Resp: 18 20 16 18   Temp:  98.2 F (36.8 C) (!) 97.5 F (36.4 C) 97.6 F (36.4 C)  TempSrc:  Oral Oral Oral  SpO2: 99% 100% 99% 97%  Weight:      Height:        Intake/Output Summary (Last 24 hours) at 10/25/2019 1655 Last data filed at 10/25/2019 1500 Gross per 24 hour  Intake 841.13 ml  Output 50 ml  Net 791.13 ml   Filed Weights   10/23/19 1507  Weight: (S) 52 kg    Examination:  General exam: Appears calm and comfortable  Respiratory system: Clear to auscultation. Respiratory effort normal. Cardiovascular system: S1 & S2 heard, RRR. Gastrointestinal system: Abdomen is nondistended, soft and nontender. No organomegaly or masses felt. Normal bowel sounds heard. Central nervous system: Alert and orientedx1, this is baseline. No focal neurological deficits. Extremities: Symmetric 5 x 5 power. Skin: No rashes, lesions or ulcers Psychiatry: Judgement and insight appear normal. Mood & affect appropriate.     Data Reviewed: I have personally reviewed following labs and imaging studies  CBC: Recent Labs  Lab 10/20/19 1610 10/23/19 1604 10/24/19 0515 10/25/19 0356  WBC 8.7 6.1 4.9 12.4*  NEUTROABS 5.5 3.6 3.7 9.7*  HGB 13.9 13.3 12.7 12.6  HCT 42.5 41.7 38.8 38.3  MCV 101.4* 103.2* 101.3* 101.1*  PLT 234 178 186 0000000   Basic Metabolic Panel: Recent Labs  Lab 10/20/19 1610 10/23/19 1604 10/25/19 0356  NA 135 143 139  K 4.1 3.7 4.3  CL 102 105 105  CO2 27 30 26   GLUCOSE 162* 144* 117*  BUN 24* 19 25*    CREATININE 1.01* 0.81 0.62  CALCIUM 9.7 9.3 8.7*   GFR: Estimated Creatinine Clearance: 41.4 mL/min (by C-G formula based on SCr of 0.62 mg/dL). Liver Function Tests: Recent Labs  Lab 10/20/19 1610 10/23/19 1604 10/25/19 0356  AST 31 33 31  ALT 24 23 20   ALKPHOS 56 53 42  BILITOT 0.5 0.5 0.3  PROT 6.8 6.8 5.7*  ALBUMIN 3.3* 3.3* 2.8*   No results for input(s): LIPASE, AMYLASE in the last 168 hours. No results for input(s): AMMONIA in the last 168 hours. Coagulation Profile: No results for input(s): INR, PROTIME in the last 168 hours. Cardiac Enzymes: No results for input(s): CKTOTAL, CKMB, CKMBINDEX, TROPONINI in the last 168 hours. BNP (last 3 results) No results for input(s): PROBNP in the last 8760 hours. HbA1C: Recent Labs    10/24/19 0515  HGBA1C 6.9*   CBG: Recent Labs  Lab 10/24/19 1733 10/24/19 2148 10/25/19 0813 10/25/19 1136 10/25/19 1622  GLUCAP 209* 134* 111* 147* 270*   Lipid Profile: Recent Labs    10/23/19 1604  TRIG 87   Thyroid Function Tests: No results for input(s): TSH, T4TOTAL, FREET4, T3FREE, THYROIDAB in the last 72 hours. Anemia Panel: Recent Labs    10/24/19 1200 10/25/19  [place]   Sepsis Labs: Recent Labs  Lab 10/20/19 1610 10/23/19 1604 10/23/19 2100  PROCALCITON  --  0.10  --   LATICACIDVEN 1.1 1.0 1.1    Recent Results (from the past 240 hour(s))  Culture, blood (routine x 2)     Status: None   Collection Time: 10/20/19  4:10 PM   Specimen: BLOOD  Result Value Ref Range Status   Specimen Description   Final    BLOOD BLOOD RIGHT ARM Performed at [facility], [address] [place], [place] [address]    Special Requests   Final    BOTTLES DRAWN AEROBIC AND ANAEROBIC Blood Culture adequate volume Performed at [facility], [address], [place], [place] [address]    Culture   Final    NO GROWTH 5 DAYS Performed at [facility], [place] [address],  [place], [place] [address]    Report Status 10/25/2019 FINAL  Final  Urine culture     Status: Abnormal   Collection Time: 10/20/19  5:15 PM   Specimen: In/Out Cath Urine  Result Value Ref Range Status   Specimen Description   Final    IN/OUT CATH URINE Performed at [facility], [address] [place], [place] [address]    Special Requests   Final    NONE Performed at [facility], [address] [place], [place] [address]    Culture MULTIPLE SPECIES PRESENT, SUGGEST RECOLLECTION (A)  Final   Report Status 10/21/2019 FINAL  Final  SARS CORONAVIRUS 2 (TAT 6-24 HRS) Nasopharyngeal Nasopharyngeal Swab     Status: Abnormal   Collection Time: 10/20/19  8:35 PM   Specimen: Nasopharyngeal Swab  Result Value Ref Range Status   SARS Coronavirus 2 POSITIVE (A) NEGATIVE Final    Comment: RESULT CALLED TO, READ BACK BY AND VERIFIED WITH: Cassell Smiles [id number] 10/21/2019 T. TYSOR (NOTE) SARS-CoV-2 target nucleic acids are DETECTED. The SARS-CoV-2 RNA is generally detectable in upper and lower respiratory specimens during the acute phase of infection. Positive results are indicative of the presence of SARS-CoV-2 RNA. Clinical correlation with patient history and other diagnostic information is  necessary to determine patient infection status. Positive results do not rule out bacterial infection or co-infection with other viruses.  The expected result is Negative. Fact Sheet for Patients: SugarRoll.be Fact Sheet for Healthcare Providers: https://www.woods-mathews.com/ This test is not yet approved or cleared by the [place] FDA and  has been authorized for detection and/or diagnosis of SARS-CoV-2 by FDA under an Emergency Use Authorization (EUA). This EUA will remain  in effect (meaning this test can be used) for t he duration of the COVID-19 declaration under Section 564(b)(1) of the Act, 21 U.S.C. section 360bbb-3(b)(1), unless  the authorization is terminated or revoked sooner. Performed at [facility], [place] [address] [place], [place] [address]   Blood Culture (routine x 2)     Status: None (Preliminary result)   Collection Time: 10/23/19  4:04 PM   Specimen: BLOOD RIGHT FOREARM  Result Value Ref Range Status   Specimen Description   Final    BLOOD RIGHT FOREARM Performed at [place] [address] [place], [place] [address]    Special Requests   Final    BOTTLES DRAWN AEROBIC AND ANAEROBIC Blood Culture adequate volume Performed at [place] [address] [place], [place] [address]  Culture   Final    NO GROWTH 2 DAYS Performed at [facility], [place] [address], [place], [place] [address]    Report Status PENDING  Incomplete  Blood Culture (routine x 2)     Status: None (Preliminary result)   Collection Time: 10/23/19  4:04 PM   Specimen: BLOOD RIGHT FOREARM  Result Value Ref Range Status   Specimen Description   Final    BLOOD RIGHT FOREARM Performed at [place] [address], [place], [place] [address]    Special Requests   Final    BOTTLES DRAWN AEROBIC AND ANAEROBIC Blood Culture results may not be optimal due to an inadequate volume of blood received in culture bottles Performed at [place] [address] [place], [place] [address]    Culture   Final    NO GROWTH 2 DAYS Performed at [place] [address] [place], [place] [address]    Report Status PENDING  Incomplete  Urine culture     Status: None   Collection Time: 10/23/19  4:04 PM   Specimen: Urine, Random  Result Value Ref Range Status   Specimen Description   Final    URINE, RANDOM Performed at [place] [address], [place], [place] [address]    Special Requests   Final    NONE Performed at [facility], [place] [address], [place], [place] [address]    Culture    Final    NO GROWTH Performed at [facility], [place] [address] [place],  [address]    Report Status 10/25/2019 FINAL  Final         Radiology Studies: No results found.      Scheduled Meds: . amoxicillin-clavulanate  1 tablet Oral Q12H  . aspirin EC  81 mg Oral Daily  . brimonidine  1 drop Both Eyes BID  . budesonide  1 puff Inhalation BID  . citalopram  10 mg Oral Daily  . dexamethasone (DECADRON) injection  6 mg Intravenous Q24H  . donepezil  10 mg Oral QHS  . enoxaparin (LOVENOX) injection  40 mg Subcutaneous Q24H  . ferrous sulfate  325 mg Oral Q breakfast  . insulin aspart  0-9 Units Subcutaneous TID WC  . memantine  10 mg Oral BID   Continuous Infusions: . sodium chloride Stopped (10/24/19 2139)  . remdesivir 100 mg in NS 100 mL 100 mg (10/25/19 0852)     LOS: 2 days   Time spent: 40 minutes  Arma Heading, MD [facility] Pager [phone]  If 7PM-7AM, please contact night-coverage [email or web address] Password [email or web address] 10/25/2019, 4:55 PM

## 2019-10-26 LAB — CBC WITH DIFFERENTIAL/PLATELET
Abs Immature Granulocytes: 0.07 10*3/uL (ref 0.00–0.07)
Basophils Absolute: 0 10*3/uL (ref 0.0–0.1)
Basophils Relative: 0 %
Eosinophils Absolute: 0 10*3/uL (ref 0.0–0.5)
Eosinophils Relative: 0 %
HCT: 39.4 % (ref 36.0–46.0)
Hemoglobin: 13 g/dL (ref 12.0–15.0)
Immature Granulocytes: 1 %
Lymphocytes Relative: 11 %
Lymphs Abs: 1.6 10*3/uL (ref 0.7–4.0)
MCH: 32.8 pg (ref 26.0–34.0)
MCHC: 33 g/dL (ref 30.0–36.0)
MCV: 99.5 fL (ref 80.0–100.0)
Monocytes Absolute: 0.7 10*3/uL (ref 0.1–1.0)
Monocytes Relative: 5 %
Neutro Abs: 11.4 10*3/uL — ABNORMAL HIGH (ref 1.7–7.7)
Neutrophils Relative %: 83 %
Platelets: 219 10*3/uL (ref 150–400)
RBC: 3.96 MIL/uL (ref 3.87–5.11)
RDW: 12 % (ref 11.5–15.5)
WBC: 13.8 10*3/uL — ABNORMAL HIGH (ref 4.0–10.5)
nRBC: 0 % (ref 0.0–0.2)

## 2019-10-26 LAB — COMPREHENSIVE METABOLIC PANEL
ALT: 21 U/L (ref 0–44)
AST: 30 U/L (ref 15–41)
Albumin: 2.9 g/dL — ABNORMAL LOW (ref 3.5–5.0)
Alkaline Phosphatase: 46 U/L (ref 38–126)
Anion gap: 6 (ref 5–15)
BUN: 25 mg/dL — ABNORMAL HIGH (ref 8–23)
CO2: 26 mmol/L (ref 22–32)
Calcium: 8.7 mg/dL — ABNORMAL LOW (ref 8.9–10.3)
Chloride: 104 mmol/L (ref 98–111)
Creatinine, Ser: 0.63 mg/dL (ref 0.44–1.00)
GFR calc Af Amer: >60 mL/min (ref >60)
GFR calc non Af Amer: >60 mL/min (ref >60)
Glucose, Bld: 155 mg/dL — ABNORMAL HIGH (ref 70–99)
Potassium: 4 mmol/L (ref 3.5–5.1)
Sodium: 136 mmol/L (ref 135–145)
Total Bilirubin: 0.4 mg/dL (ref 0.3–1.2)
Total Protein: 5.6 g/dL — ABNORMAL LOW (ref 6.5–8.1)

## 2019-10-26 LAB — FERRITIN: Ferritin: 896 ng/mL — ABNORMAL HIGH (ref 11–307)

## 2019-10-26 LAB — C-REACTIVE PROTEIN: CRP: 0.6 mg/dL (ref <1.0)

## 2019-10-26 LAB — D-DIMER, QUANTITATIVE: D-Dimer, Quant: 2.34 ug/mL-FEU — ABNORMAL HIGH (ref 0.00–0.50)

## 2019-10-26 LAB — GLUCOSE, CAPILLARY
Glucose-Capillary: 131 mg/dL — ABNORMAL HIGH (ref 70–99)
Glucose-Capillary: 186 mg/dL — ABNORMAL HIGH (ref 70–99)

## 2019-10-26 MED ORDER — DEXAMETHASONE 6 MG PO TABS: 6.0000 mg | ORAL_TABLET | Freq: Every day | ORAL | 0 refills | Status: AC

## 2019-10-26 MED ORDER — CLONIDINE HCL 0.1 MG PO TABS
0.1000 mg | ORAL_TABLET | Freq: Once | ORAL | Status: AC
  Administered 2019-10-26: 0.1 mg via ORAL
  Filled 2019-10-26: qty 1

## 2019-10-26 MED ORDER — AMOXICILLIN-POT CLAVULANATE 875-125 MG PO TABS: 1.0000 | ORAL_TABLET | Freq: Two times a day (BID) | ORAL | 0 refills | Status: AC

## 2019-10-26 NOTE — Discharge Summary (Signed)
Physician Discharge Summary  [name] [id number] DOB: 10-20-1931 DOA: 10/23/2019  PCP: Marin Olp, MD  Admit date: 10/23/2019 Discharge date: 10/26/2019  Admitted From: Home  Disposition:  Home   Recommendations for Outpatient Follow-up:  1. Follow up with PCP in 1-2 weeks 2. Please obtain CMP/CBC in one week 3. Please continue decadron for 7 more days till completion 4. Please ensure patient received outpatient dose of a remdesivir 5. Please ensure patient completed treatment with ABX for augmentin for UTI 6. Please go over chronic and new medications for medication reconciliation   Home Health: yes  Equipment/Devices: None  Discharge Condition:Stable  CODE STATUS: Full Code Diet recommendation: Regular Diet  Brief/Interim Summary: As per admitting"86 y.o.femalewith medical history significant ofdementia, asthma, CAD, hypertension, type 2 diabetes and history of DVT who presents with concerns of increased lethargy and decreased oxygen saturation. History is provided by daughter, [name], at bedside due to patient's lethargy and altered mental status. Patient was recently evaluated at [facility] ED for altered mental status and was found to have a UTI and also incidentally found to be COVIDpositive. There was initial plans for admission but after about 16 hours of boarding in the EDpatient had improved with IV Rocephin and fluids so family decided to take her home with close monitoring. Since then she has been on cefdinir but daughter continues to notice gradual decline in her mental status as well as increased weakness. Normally she is able to ambulate from her chair to the bathroom with a walker and minimal assist,but she has been unable to do thisfor the past few days. It has been difficult even to feed her due to her lethargy. Today, daughter also noticed that her oxygen appeared to be low and decided to call EMS. She denies patient complaining of any  pain. No fever, cough but she does have a chronic runny nose. No nausea, vomiting or diarrhea.  ED Course:She had a temperature of 100, hypertensive up to systolic of 0000000 on room air. WBC of 6.1, hemoglobin of 13.3. Glucose of 144. Lactic acid of 1.0. CPR of 2.LDH 124, triglyceride 87, ferritin of 592, procalcitonin of 0.10. D-dimer 3.9, fibrinogen of 493. Chest x-ray showed stable appearance of the chest with persistent small left-sided pleural effusion  Interim History Patient started on remdesivir and steroids as well as vitamins as supportive care. Started on ceftriaxone and symptoms improved and remains afebrile. Respiratory status is stable, on room air this morning,overall patient feeling much improved per daughter at the bedside on 1/3. On 1/4: Patient doing well, back to baseline mental status, overall doing well and eating and drinking   Discharge Diagnoses:  Principal Problem:   COVID-19 virus infection Active Problems:   Diabetes ([place])   Asthma, chronic   Alzheimer's disease ([place])   UTI (urinary tract infection)   Altered mental status   Essential hypertension   Bradycardia with 51-60 beats per minute    Discharge Instructions  Discharge Instructions    Call MD for:  difficulty breathing, headache or visual disturbances   Complete by: As directed    Call MD for:  extreme fatigue   Complete by: As directed    Call MD for:  persistant dizziness or light-headedness   Complete by: As directed    Call MD for:  persistant nausea and vomiting   Complete by: As directed    Call MD for:  temperature >100.4   Complete by: As directed    Diet - low  sodium heart healthy   Complete by: As directed    Discharge instructions   Complete by: As directed    1. Continue decadron (steroid) for 7 more days Covid 2. Continue antibiotics for 4 more days for UTI 3. Continue isolation for 2 full weeks since original test positive date  Patient scheduled for outpatient  Remdesivir infusion at 530PM on Wednesday 1/6.  Please advise them to report to [facility] at [address]  Drive to the security guard and tell them you are here for an infusion. They will direct you to the front entrance where we will come and get you.  For questions call [phone].   Increase activity slowly   Complete by: As directed    MyChart COVID-19 home monitoring program   Complete by: Oct 26, 2019    Is the patient willing to use the [facility] for home monitoring?: No     Allergies as of 10/26/2019   No Known Allergies     Medication List    STOP taking these medications   amoxicillin-clavulanate 400-57 MG/5ML suspension Commonly known as: AUGMENTIN Replaced by: amoxicillin-clavulanate 875-125 MG tablet   azelastine 0.1 % nasal spray Commonly known as: ASTELIN   cefdinir 300 MG capsule Commonly known as: OMNICEF   nitroGLYCERIN 0.2 mg/hr patch Commonly known as: NITRODUR - Dosed in mg/24 hr   pentoxifylline 400 MG CR tablet Commonly known as: TRENTAL     TAKE these medications   albuterol (2.5 MG/3ML) 0.083% nebulizer solution Commonly known as: PROVENTIL USE THREE MILLILITERS VIA NEBULIZATION BY MOUTH EVERY 6 HOURS AS NEEDED FOR WHEEZING OR SHORTNESS OF BREATH What changed:   how much to take  how to take this  when to take this  additional instructions   albuterol 108 (90 Base) MCG/ACT inhaler Commonly known as: ProAir HFA 2 PUFFS EVERY 6 HOURS AS NEEDED FOR SHORTNESS OF BREATH. What changed:   how much to take  how to take this  when to take this  reasons to take this  additional instructions   Alphagan P 0.1 % Soln Generic drug: brimonidine Place 1 drop into both eyes 2 (two) times daily.   amoxicillin-clavulanate 875-125 MG tablet Commonly known as: AUGMENTIN Take 1 tablet by mouth every 12 (twelve) hours for 4 days. Replaces: amoxicillin-clavulanate 400-57 MG/5ML suspension   ascorbic acid 500 MG  tablet Commonly known as: VITAMIN C Take 500 mg by mouth 2 (two) times daily.   aspirin 81 MG tablet Take 81 mg by mouth daily.   budesonide 0.25 MG/2ML nebulizer solution Commonly known as: PULMICORT Take 2 mLs (0.25 mg total) by nebulization daily. ICD10 Code-J45.909 What changed: when to take this   calcium carbonate 600 MG Tabs tablet Commonly known as: OS-CAL Take 600 mg by mouth 2 (two) times daily with a meal.   citalopram 10 MG tablet Commonly known as: CELEXA TAKE 1 TABLET ONCE DAILY.   dexamethasone 6 MG tablet Commonly known as: DECADRON Take 1 tablet (6 mg total) by mouth daily for 7 days.   donepezil 10 MG tablet Commonly known as: ARICEPT TAKE ONE TABLET AT BEDTIME.   famotidine 20 MG tablet Commonly known as: PEPCID TAKE ONE TABLET TWICE A DAY AS NEEDED FOR HEARTBURN OR INDIGESTION. What changed: See the new instructions.   ferrous sulfate 325 (65 FE) MG EC tablet Take 325 mg by mouth daily with breakfast.   Medihoney Wound/Burn Dressing Gel Apply to affected are 3 times a  week, and cover with sterile dressing.   memantine 10 MG tablet Commonly known as: NAMENDA TAKE 1 TABLET TWICE DAILY FOR MEMORY. What changed: See the new instructions.   multivitamin with minerals Tabs tablet Take 1 tablet by mouth daily.   mupirocin ointment 2 % Commonly known as: BACTROBAN Apply 1 application topically 2 (two) times daily. Apply to the affected area 2 times a day   silver sulfADIAZINE 1 % cream Commonly known as: Silvadene Apply 1 application topically daily.            Durable Medical Equipment  (From admission, onward)         Start     Ordered   10/26/19 0924  For home use only DME Hospital bed  Once    Question Answer Comment  Length of Need Lifetime   Patient has (list medical condition): COVID 19, DEMENTIA, ASTHMA, DVT,   The above medical condition requires: Patient requires the ability to reposition frequently   Head must be elevated  greater than: 45 degrees   Bed type Semi-electric   Support Surface: Gel Overlay      10/26/19 0924          No Known Allergies  Consultations:  None   Procedures/Studies: DG Chest 2 View  Result Date: 10/20/2019 CLINICAL DATA:  Altered mental status. EXAM: CHEST - 2 VIEW COMPARISON:  07/21/2018 FINDINGS: The cardiomediastinal silhouette is unchanged with borderline cardiomegaly. The lungs remain hyperinflated with chronic interstitial coarsening. Mild asymmetric opacities at the left lung base are similar to the prior study and likely reflect scarring. There is a persistent small left pleural effusion. No acute airspace consolidation, edema, or pneumothorax is identified. No acute osseous abnormality is seen. IMPRESSION: COPD with an unchanged small left pleural effusion. No evidence of acute airspace disease. Electronically Signed   By: Logan Bores M.D.   On: 10/20/2019 16:58   CT Head Wo Contrast  Result Date: 10/20/2019 CLINICAL DATA:  Altered mental status. EXAM: CT HEAD WITHOUT CONTRAST TECHNIQUE: Contiguous axial images were obtained from the base of the skull through the vertex without intravenous contrast. COMPARISON:  CT head dated February 11, 2019. FINDINGS: Brain: No evidence of acute infarction, hemorrhage, hydrocephalus, extra-axial collection or mass lesion/mass effect. Stable atrophy and chronic microvascular ischemic changes. Vascular: Calcified atherosclerosis at the skullbase. No hyperdense vessel. Skull: Normal. Negative for fracture or focal lesion. Sinuses/Orbits: No acute finding. Other: None. IMPRESSION: 1.  No acute intracranial abnormality. Electronically Signed   By: Titus Dubin M.D.   On: 10/20/2019 16:49   DG Chest Port 1 View  Result Date: 10/23/2019 CLINICAL DATA:  COVID positive EXAM: PORTABLE CHEST 1 VIEW COMPARISON:  October 20, 2019 FINDINGS: Again noted are findings of COPD. There is no pneumothorax. There may be a small left-sided pleural  effusion. The heart size is stable from prior study. Aortic calcifications are noted. There is no acute osseous abnormality detected. IMPRESSION: 1. Stable appearance of the chest with a persistent small left-sided pleural effusion. 2. COPD. 3. Aortic calcifications are again noted. Electronically Signed   By: Constance Holster M.D.   On: 10/23/2019 15:42   (Echo, Carotid, EGD, Colonoscopy, ERCP)    Subjective: No acute events overnight. Patient doing well. Is at her baseline in terms of mental status. Does not have any complaints. Will go home today and is eager to do so. Called and spoke with daughter and updated about care plan as well as plan for discharge on remdesivir as  outpatient. Gave instructions. Otherwise no changes.   Discharge Exam: Vitals:   10/26/19 0646 10/26/19 0810  BP: (!) 189/71 (!) 185/72  Pulse: (!) 51   Resp: 17   Temp:    SpO2: 98%    Vitals:   10/25/19 2205 10/26/19 0519 10/26/19 0646 10/26/19 0810  BP: (!) 168/66 (!) 182/71 (!) 189/71 (!) 185/72  Pulse: (!) 52 (!) 44 (!) 51   Resp: 18 18 17    Temp: (!) 97.5 F (36.4 C) 97.6 F (36.4 C)    TempSrc: Oral Oral    SpO2: 100% 98% 98%   Weight:      Height:        General exam: Appears calm and comfortable  Respiratory system: Clear to auscultation. Respiratory effort normal. Cardiovascular system: S1 & S2 heard, RRR. Gastrointestinal system: Abdomen is nondistended, soft and nontender. No organomegaly or masses felt. Normal bowel sounds heard. Central nervous system: Alert and orientedx1, this is baseline, and unchanged from yesterday 10/24/18. No focal neurological deficits. Extremities: Symmetric 5 x 5 power. Skin: No rashes, lesions or ulcers Psychiatry: Judgement and insight appear normal. Mood & affect appropriate.    The results of significant diagnostics from this hospitalization (including imaging, microbiology, ancillary and laboratory) are listed below for reference.     Microbiology: Recent  Results (from the past 240 hour(s))  Culture, blood (routine x 2)     Status: None   Collection Time: 10/20/19  4:10 PM   Specimen: BLOOD  Result Value Ref Range Status   Specimen Description   Final    BLOOD BLOOD RIGHT ARM Performed at [facility], [address] [place], [place], [place] [address]    Special Requests   Final    BOTTLES DRAWN AEROBIC AND ANAEROBIC Blood Culture adequate volume Performed at [facility], [address] [place], [place] [address]    Culture   Final    NO GROWTH 5 DAYS Performed at [facility], [place] [address], [place], [place] [address]    Report Status 10/25/2019 FINAL  Final  Urine culture     Status: Abnormal   Collection Time: 10/20/19  5:15 PM   Specimen: In/Out Cath Urine  Result Value Ref Range Status   Specimen Description   Final    IN/OUT CATH URINE Performed at [facility], [address] [place], [place] [address]    Special Requests   Final    NONE Performed at [facility], [address] [place], [place] [address]    Culture MULTIPLE SPECIES PRESENT, SUGGEST RECOLLECTION (A)  Final   Report Status 10/21/2019 FINAL  Final  SARS CORONAVIRUS 2 (TAT 6-24 HRS) Nasopharyngeal Nasopharyngeal Swab     Status: Abnormal   Collection Time: 10/20/19  8:35 PM   Specimen: Nasopharyngeal Swab  Result Value Ref Range Status   SARS Coronavirus 2 POSITIVE (A) NEGATIVE Final    Comment: RESULT CALLED TO, READ BACK BY AND VERIFIED WITH: Cassell Smiles 0244 10/21/2019 T. TYSOR (NOTE) SARS-CoV-2 target nucleic acids are DETECTED. The SARS-CoV-2 RNA is generally detectable in upper and lower respiratory specimens during the acute phase of infection. Positive results are indicative of the presence of SARS-CoV-2 RNA. Clinical correlation with patient history and other diagnostic information is  necessary to determine patient infection status. Positive results do not rule out bacterial infection or  co-infection with other viruses.  The expected result is Negative. Fact Sheet for  Patients: SugarRoll.be Fact Sheet for Healthcare Providers: https://www.woods-mathews.com/ This test is not yet approved or cleared by the [place] FDA and  has been authorized for detection and/or diagnosis of SARS-CoV-2 by FDA under an Emergency Use Authorization (EUA). This EUA will remain  in effect (meaning this test can be used) for t he duration of the COVID-19 declaration under Section 564(b)(1) of the Act, 21 U.S.C. section 360bbb-3(b)(1), unless the authorization is terminated or revoked sooner. Performed at [facility], [place] [address], [place], [place] [address]   Blood Culture (routine x 2)     Status: None (Preliminary result)   Collection Time: 10/23/19  4:04 PM   Specimen: BLOOD RIGHT FOREARM  Result Value Ref Range Status   Specimen Description   Final    BLOOD RIGHT FOREARM Performed at [place] [address], [place], [place] [address]    Special Requests   Final    BOTTLES DRAWN AEROBIC AND ANAEROBIC Blood Culture adequate volume Performed at [facility] [address], [place], [place] [address]    Culture   Final    NO GROWTH 2 DAYS Performed at [place] [address], [place], [place] [address]    Report Status PENDING  Incomplete  Blood Culture (routine x 2)     Status: None (Preliminary result)   Collection Time: 10/23/19  4:04 PM   Specimen: BLOOD RIGHT FOREARM  Result Value Ref Range Status   Specimen Description   Final    BLOOD RIGHT FOREARM Performed at [facility] [address], [place], [place] [address]    Special Requests   Final    BOTTLES DRAWN AEROBIC AND ANAEROBIC Blood Culture results may not be optimal due to an inadequate volume of blood received in culture bottles Performed at [facility]  [address] [place], [place] [address]    Culture   Final    NO GROWTH 2 DAYS Performed at [place] [address] [place], [place] [address]    Report Status PENDING  Incomplete  Urine culture     Status: None   Collection Time: 10/23/19  4:04 PM   Specimen: Urine, Random  Result Value Ref Range Status   Specimen Description   Final    URINE, RANDOM Performed at [facility] [address], [place], [place] [address]    Special Requests   Final    NONE Performed at [facility], [place] [address], [place], [place] [address]    Culture   Final    NO GROWTH Performed at [facility], [place] [address] [place], [place] [address]    Report Status 10/25/2019 FINAL  Final     Labs: BNP (last 3 results) No results for input(s): BNP in the last 8760 hours. Basic Metabolic Panel: Recent Labs  Lab 10/20/19 1610 10/23/19 1604 10/25/19 0356 10/26/19 0332  NA 135 143 139 136  K 4.1 3.7 4.3 4.0  CL 102 105 105 104  CO2 27 30 26 26   GLUCOSE 162* 144* 117* 155*  BUN 24* 19 25* 25*  CREATININE 1.01* 0.81 0.62 0.63  CALCIUM 9.7 9.3 8.7* 8.7*   Liver Function Tests: Recent Labs  Lab 10/20/19 1610 10/23/19 1604 10/25/19 0356 10/26/19 0332  AST 31 33 31 30  ALT 24 23 20 21   ALKPHOS 56 53 42 46  BILITOT 0.5 0.5 0.3 0.4  PROT 6.8 6.8 5.7* 5.6*  ALBUMIN 3.3* 3.3* 2.8* 2.9*   No results for input(s): LIPASE, AMYLASE in the last 168 hours. No results for input(s): AMMONIA in the last 168 hours. CBC: Recent Labs  Lab 10/20/19 1610 10/23/19 1604 10/24/19 0515 10/25/19 0356 10/26/19 0332  WBC 8.7 6.1 4.9 12.4* 13.8*  NEUTROABS 5.5 3.6 3.7 9.7* 11.4*  HGB 13.9 13.3 12.7 12.6 13.0  HCT 42.5 41.7 38.8 38.3 39.4  MCV 101.4* 103.2* 101.3* 101.1* 99.5  PLT 234 178 186 193 219   Cardiac Enzymes: No results for input(s): CKTOTAL, CKMB, CKMBINDEX, TROPONINI in the last 168 hours. BNP: Invalid input(s): POCBNP CBG: Recent  Labs  Lab 10/25/19 1136 10/25/19 1622 10/25/19 2158 10/26/19 0734 10/26/19 1126  GLUCAP 147* 270* 193* 131* 186*   D-Dimer Recent Labs    10/25/19 0356 10/26/19 0332  DDIMER 2.97* 2.34*   Hgb A1c Recent Labs    10/24/19 0515  HGBA1C 6.9*   Lipid Profile Recent Labs    10/23/19 1604  TRIG 87   Thyroid function studies No results for input(s): TSH, T4TOTAL, T3FREE, THYROIDAB in the last 72 hours.  Invalid input(s): FREET3 Anemia work up Recent Labs    10/25/19 0356 10/26/19 0332  FERRITIN 636* 896*   Urinalysis    Component Value Date/Time   COLORURINE YELLOW 10/23/2019 [place] 10/23/2019 1604   LABSPEC 1.020 10/23/2019 1604   PHURINE 5.0 10/23/2019 1604   GLUCOSEU 150 (A) 10/23/2019 1604   HGBUR NEGATIVE 10/23/2019 [place] 10/23/2019 1604   BILIRUBINUR Negative 02/12/2019 1019   KETONESUR 5 (A) 10/23/2019 1604   PROTEINUR NEGATIVE 10/23/2019 1604   UROBILINOGEN 1.0 02/12/2019 1019   UROBILINOGEN 0.2 05/26/2012 1113   NITRITE NEGATIVE 10/23/2019 1604   LEUKOCYTESUR TRACE (A) 10/23/2019 1604   Sepsis Labs Invalid input(s): PROCALCITONIN,  WBC,  LACTICIDVEN Microbiology Recent Results (from the past 240 hour(s))  Culture, blood (routine x 2)     Status: None   Collection Time: 10/20/19  4:10 PM   Specimen: BLOOD  Result Value Ref Range Status   Specimen Description   Final    BLOOD BLOOD RIGHT ARM Performed at [facility], [address] [place], [place] [address]    Special Requests   Final    BOTTLES DRAWN AEROBIC AND ANAEROBIC Blood Culture adequate volume Performed at [facility], [address] [place], [place] [address]    Culture   Final    NO GROWTH 5 DAYS Performed at [facility], [place] [address] [place], [place] [address]    Report Status 10/25/2019 FINAL  Final  Urine culture     Status: Abnormal   Collection Time: 10/20/19  5:15 PM   Specimen: In/Out Cath  Urine  Result Value Ref Range Status   Specimen Description   Final    IN/OUT CATH URINE Performed at [facility], [address] [place], [place] [address]    Special Requests   Final    NONE Performed at [facility], [address] [place], [place] [address]    Culture MULTIPLE SPECIES PRESENT, SUGGEST RECOLLECTION (A)  Final   Report Status 10/21/2019 FINAL  Final  SARS CORONAVIRUS 2 (TAT 6-24 HRS) Nasopharyngeal Nasopharyngeal Swab     Status: Abnormal   Collection Time: 10/20/19  8:35 PM   Specimen: Nasopharyngeal Swab  Result Value Ref Range Status  SARS Coronavirus 2 POSITIVE (A) NEGATIVE Final    Comment: RESULT CALLED TO, READ BACK BY AND VERIFIED WITH: Cassell Smiles 0244 10/21/2019 T. TYSOR (NOTE) SARS-CoV-2 target nucleic acids are DETECTED. The SARS-CoV-2 RNA is generally detectable in upper and lower respiratory specimens during the acute phase of infection. Positive results are indicative of the presence of SARS-CoV-2 RNA. Clinical correlation with patient history and other diagnostic information is  necessary to determine patient infection status. Positive results do not rule out bacterial infection or co-infection with other viruses.  The expected result is Negative. Fact Sheet for Patients: SugarRoll.be Fact Sheet for Healthcare Providers: https://www.woods-mathews.com/ This test is not yet approved or cleared by the [place] FDA and  has been authorized for detection and/or diagnosis of SARS-CoV-2 by FDA under an Emergency Use Authorization (EUA). This EUA will remain  in effect (meaning this test can be used) for t he duration of the COVID-19 declaration under Section 564(b)(1) of the Act, 21 U.S.C. section 360bbb-3(b)(1), unless the authorization is terminated or revoked sooner. Performed at [facility], [place] [address], [place], [place] [address]   Blood Culture (routine x 2)      Status: None (Preliminary result)   Collection Time: 10/23/19  4:04 PM   Specimen: BLOOD RIGHT FOREARM  Result Value Ref Range Status   Specimen Description   Final    BLOOD RIGHT FOREARM Performed at [facility] [address] [place], [place] [address]    Special Requests   Final    BOTTLES DRAWN AEROBIC AND ANAEROBIC Blood Culture adequate volume Performed at [facility] [address], [place], [place] [address]    Culture   Final    NO GROWTH 2 DAYS Performed at [facility] [address], [place], [place] [address]    Report Status PENDING  Incomplete  Blood Culture (routine x 2)     Status: None (Preliminary result)   Collection Time: 10/23/19  4:04 PM   Specimen: BLOOD RIGHT FOREARM  Result Value Ref Range Status   Specimen Description   Final    BLOOD RIGHT FOREARM Performed at [facility] [address], [place], [place] [address]    Special Requests   Final    BOTTLES DRAWN AEROBIC AND ANAEROBIC Blood Culture results may not be optimal due to an inadequate volume of blood received in culture bottles Performed at [facility] [address] [place], [place] [address]    Culture   Final    NO GROWTH 2 DAYS Performed at [facility] [address], [place], [place] [address]    Report Status PENDING  Incomplete  Urine culture     Status: None   Collection Time: 10/23/19  4:04 PM   Specimen: Urine, Random  Result Value Ref Range Status   Specimen Description   Final    URINE, RANDOM Performed at [facility] [address] [place], [place] [address]    Special Requests   Final    NONE Performed at [facility], [place] [address] [place], [place] [address]    Culture   Final    NO GROWTH Performed at [facility], [place] [address] [place],  [address]    Report Status 10/25/2019 FINAL  Final     Time  coordinating discharge: Over 30 minutes  SIGNED:  Arma Heading, MD  [facility] 10/26/2019, 12:09 PM  Pager   If 7PM-7AM, please contact night-coverage [email or web address] Password [email or web address]

## 2019-10-26 NOTE — Care Management Important Message (Signed)
Important Message  Patient Details IM Letter given to Gabriel Earing RN Case Manager to present to the Patient Name: [name] MRN: [id number] Date of Birth: [birth year]   Medicare Important Message Given:  Yes     Kerin Salen 10/26/2019, 10:21 AM

## 2019-10-26 NOTE — Progress Notes (Signed)
Pt to be discharged to home this afternoon. Pt with history of Dementia. Daughter Lanell Matar  called and all discharge teaching/instructons reviewed including all Medications and schedules. Understanding verbalized. Discharge packet with Pt at time of discharge

## 2019-10-26 NOTE — Progress Notes (Signed)
Patient scheduled for outpatient Remdesivir infusion at 530PM on Wednesday 1/6.  Please advise them to report to [facility] at [address].  Drive to the security guard and tell them you are here for an infusion. They will direct you to the front entrance where we will come and get you.  For questions call [phone].  Thanks  

## 2019-10-26 NOTE — Discharge Instructions (Signed)
COVID-19: How to Protect Yourself and Others Know how it spreads  There is currently no vaccine to prevent coronavirus disease 2019 (COVID-19).  The best way to prevent illness is to avoid being exposed to this virus.  The virus is thought to spread mainly from person-to-person. ? Between people who are in close contact with one another (within about 6 feet). ? Through respiratory droplets produced when an infected person coughs, sneezes or talks. ? These droplets can land in the mouths or noses of people who are nearby or possibly be inhaled into the lungs. ? COVID-19 may be spread by people who are not showing symptoms. Everyone should Clean your hands often  Wash your hands often with soap and water for at least 20 seconds especially after you have been in a public place, or after blowing your nose, coughing, or sneezing.  If soap and water are not readily available, use a hand sanitizer that contains at least 60% alcohol. Cover all surfaces of your hands and rub them together until they feel dry.  Avoid touching your eyes, nose, and mouth with unwashed hands. Avoid close contact  Limit contact with others as much as possible.  Avoid close contact with people who are sick.  Put distance between yourself and other people. ? Remember that some people without symptoms may be able to spread virus. ? This is especially important for people who are at higher risk of getting very GainPain.com.cy Cover your mouth and nose with a mask when around others  You could spread COVID-19 to others even if you do not feel sick.  Everyone should wear a mask in public settings and when around people not living in their household, especially when social distancing is difficult to maintain. ? Masks should not be placed on young children under age 19, anyone who has trouble breathing, or is unconscious, incapacitated or otherwise  unable to remove the mask without assistance.  The mask is meant to protect other people in case you are infected.  Do NOT use a facemask meant for a Dietitian.  Continue to keep about 6 feet between yourself and others. The mask is not a substitute for social distancing. Cover coughs and sneezes  Always cover your mouth and nose with a tissue when you cough or sneeze or use the inside of your elbow.  Throw used tissues in the trash.  Immediately wash your hands with soap and water for at least 20 seconds. If soap and water are not readily available, clean your hands with a hand sanitizer that contains at least 60% alcohol. Clean and disinfect  Clean AND disinfect frequently touched surfaces daily. This includes tables, doorknobs, light switches, countertops, handles, desks, phones, keyboards, toilets, faucets, and sinks. RackRewards.fr  If surfaces are dirty, clean them: Use detergent or soap and water prior to disinfection.  Then, use a household disinfectant. You can see a list of EPA-registered household disinfectants here. michellinders.com 06/23/2019 This information is not intended to replace advice given to you by your health care provider. Make sure you discuss any questions you have with your health care provider. Document Revised: 07/01/2019 Document Reviewed: 04/29/2019 Elsevier Patient Education  Anthon are scheduled for an outpatient infusion of Remdesivir at 530PM on Wednesday 1/6.  Please report to [facility] at [address].  Drive to the security guard and tell them you are here for an infusion. They will direct you to the front entrance where we will come  and get you.  For questions call [phone].  Thanks

## 2019-10-27 ENCOUNTER — Ambulatory Visit (HOSPITAL_COMMUNITY)
Admission: RE | Admit: 2019-10-27 | Discharge: 2019-10-27 | Disposition: A | Discharge status: Home / Self Care | Payer: Medicare Other | Source: Ambulatory Visit | Attending: Pulmonary Disease | Admitting: Pulmonary Disease

## 2019-10-27 ENCOUNTER — Telehealth: Payer: Self-pay

## 2019-10-27 ENCOUNTER — Encounter (HOSPITAL_COMMUNITY): Payer: Self-pay

## 2019-10-27 VITALS — BP 181/73 | HR 61 | Temp 98.2°F | Resp 16

## 2019-10-27 DIAGNOSIS — U071 COVID-19: Secondary | ICD-10-CM | POA: Insufficient documentation

## 2019-10-27 LAB — CULTURE, BLOOD (ROUTINE X 2)
Culture: NO GROWTH
Special Requests: ADEQUATE

## 2019-10-27 MED ORDER — ALBUTEROL SULFATE HFA 108 (90 BASE) MCG/ACT IN AERS: 2.0000 | INHALATION_SPRAY | Freq: Once | RESPIRATORY_TRACT | Status: DC | PRN

## 2019-10-27 MED ORDER — SODIUM CHLORIDE 0.9 % IV SOLN
100.0000 mg | Freq: Once | INTRAVENOUS | Status: AC
  Administered 2019-10-27: 100 mg via INTRAVENOUS

## 2019-10-27 MED ORDER — FAMOTIDINE IN NACL 20-0.9 MG/50ML-% IV SOLN: 20.0000 mg | Freq: Once | INTRAVENOUS | Status: DC | PRN

## 2019-10-27 MED ORDER — DIPHENHYDRAMINE HCL 50 MG/ML IJ SOLN: 50.0000 mg | Freq: Once | INTRAMUSCULAR | Status: DC | PRN

## 2019-10-27 MED ORDER — METHYLPREDNISOLONE SODIUM SUCC 125 MG IJ SOLR: 125.0000 mg | Freq: Once | INTRAMUSCULAR | Status: DC | PRN

## 2019-10-27 MED ORDER — EPINEPHRINE 0.3 MG/0.3ML IJ SOAJ: 0.3000 mg | Freq: Once | INTRAMUSCULAR | Status: DC | PRN

## 2019-10-27 MED ORDER — SODIUM CHLORIDE 0.9 % IV SOLN
INTRAVENOUS | Status: DC | PRN
  Administered 2019-10-27: 250 mL via INTRAVENOUS

## 2019-10-27 NOTE — Discharge Instructions (Signed)
10 Things You Can Do to Manage Your COVID-19 Symptoms at Home If you have possible or confirmed COVID-19: 1. Stay home from work and school. And stay away from other public places. If you must go out, avoid using any kind of public transportation, ridesharing, or taxis. 2. Monitor your symptoms carefully. If your symptoms get worse, call your healthcare provider immediately. 3. Get rest and stay hydrated. 4. If you have a medical appointment, call the healthcare provider ahead of time and tell them that you have or may have COVID-19. 5. For medical emergencies, call 911 and notify the dispatch personnel that you have or may have COVID-19. 6. Cover your cough and sneezes with a tissue or use the inside of your elbow. 7. Wash your hands often with soap and water for at least 20 seconds or clean your hands with an alcohol-based hand sanitizer that contains at least 60% alcohol. 8. As much as possible, stay in a specific room and away from other people in your home. Also, you should use a separate bathroom, if available. If you need to be around other people in or outside of the home, wear a mask. 9. Avoid sharing personal items with other people in your household, like dishes, towels, and bedding. 10. Clean all surfaces that are touched often, like counters, tabletops, and doorknobs. Use household cleaning sprays or wipes according to the label instructions. cdc.gov/coronavirus 04/21/2019 This information is not intended to replace advice given to you by your health care provider. Make sure you discuss any questions you have with your health care provider. Document Revised: 09/23/2019 Document Reviewed: 09/23/2019 Elsevier Patient Education  2020 Elsevier Inc.  

## 2019-10-27 NOTE — TOC Progression Note (Signed)
Transition of Care [facility]) - Progression Note    Patient Details  Name: [name] MRN: [id number] Date of Birth: 11/09/1930  [facility] CM/SW Contact  Purcell Mouton, RN Phone Number: 10/27/2019, 2:04 PM  Clinical Narrative:    A call from [facility] revealed that their staff will follow pt for HHPT/HHRN.         Expected Discharge Plan and Services           Expected Discharge Date: 10/26/19                                     Social Determinants of Health (SDOH) Interventions    Readmission Risk Interventions No flowsheet data found.

## 2019-10-27 NOTE — Progress Notes (Signed)
  Diagnosis: COVID-19  Physician: Dr. Joya Gaskins   Procedure: [facility] Med: remdesivir infusion.  Complications: No immediate complications noted.  Discharge: Discharged home   [place], [place] 10/27/2019

## 2019-10-27 NOTE — Telephone Encounter (Signed)
Received call from [facility] that when discharged orders were not placed for home health nurse. They want to know that if they send orders over for PT and skilled nursing eval and teat if we can sign.

## 2019-10-28 ENCOUNTER — Telehealth: Payer: Self-pay

## 2019-10-28 ENCOUNTER — Ambulatory Visit (INDEPENDENT_AMBULATORY_CARE_PROVIDER_SITE_OTHER): Payer: Medicare Other | Admitting: Family Medicine

## 2019-10-28 ENCOUNTER — Encounter: Payer: Self-pay | Admitting: Family Medicine

## 2019-10-28 VITALS — BP 161/86 | HR 65 | Ht 66.0 in | Wt 114.0 lb

## 2019-10-28 DIAGNOSIS — E11621 Type 2 diabetes mellitus with foot ulcer: Secondary | ICD-10-CM | POA: Diagnosis not present

## 2019-10-28 DIAGNOSIS — I700 Atherosclerosis of aorta: Secondary | ICD-10-CM

## 2019-10-28 DIAGNOSIS — G309 Alzheimer's disease, unspecified: Secondary | ICD-10-CM | POA: Diagnosis not present

## 2019-10-28 DIAGNOSIS — E1159 Type 2 diabetes mellitus with other circulatory complications: Secondary | ICD-10-CM | POA: Diagnosis not present

## 2019-10-28 DIAGNOSIS — I739 Peripheral vascular disease, unspecified: Secondary | ICD-10-CM

## 2019-10-28 DIAGNOSIS — L97509 Non-pressure chronic ulcer of other part of unspecified foot with unspecified severity: Secondary | ICD-10-CM

## 2019-10-28 DIAGNOSIS — E1169 Type 2 diabetes mellitus with other specified complication: Secondary | ICD-10-CM | POA: Diagnosis not present

## 2019-10-28 DIAGNOSIS — U071 COVID-19: Secondary | ICD-10-CM

## 2019-10-28 DIAGNOSIS — N3000 Acute cystitis without hematuria: Secondary | ICD-10-CM | POA: Diagnosis not present

## 2019-10-28 DIAGNOSIS — R531 Weakness: Secondary | ICD-10-CM

## 2019-10-28 DIAGNOSIS — E46 Unspecified protein-calorie malnutrition: Secondary | ICD-10-CM | POA: Diagnosis not present

## 2019-10-28 DIAGNOSIS — I10 Essential (primary) hypertension: Secondary | ICD-10-CM

## 2019-10-28 DIAGNOSIS — F0280 Dementia in other diseases classified elsewhere without behavioral disturbance: Secondary | ICD-10-CM

## 2019-10-28 DIAGNOSIS — R400 Somnolence: Secondary | ICD-10-CM

## 2019-10-28 DIAGNOSIS — E441 Mild protein-calorie malnutrition: Secondary | ICD-10-CM

## 2019-10-28 DIAGNOSIS — E785 Hyperlipidemia, unspecified: Secondary | ICD-10-CM

## 2019-10-28 LAB — CULTURE, BLOOD (ROUTINE X 2)
Culture: NO GROWTH
Culture: NO GROWTH
Special Requests: ADEQUATE

## 2019-10-28 NOTE — Patient Instructions (Signed)
Health Maintenance Due  Topic Date Due  . FOOT EXAM  07/25/2019    Depression screen [facility] 2/9 02/11/2019 09/03/2017 06/17/2017  Decreased Interest 0 0 0  Down, Depressed, Hopeless 0 0 0  PHQ - 2 Score 0 0 0  Some recent data might be hidden    Recommended follow up: No follow-ups on file.

## 2019-10-28 NOTE — Telephone Encounter (Signed)
Transition Care Management Follow-up Telephone Call   Date discharged?10/26/2019   How have you been since you were released from the hospital? Patient doing better.   Do you understand why you were in the hospital? yes   Do you understand the discharge instructions? yes   Where were you discharged to? Home   Items Reviewed:  Medications reviewed: yes  Allergies reviewed: yes  Dietary changes reviewed: yes  Referrals reviewed: yes   Functional Questionnaire:   Activities of Daily Living (ADLs):   She states they are independent in the following: None States they require assistance with the following: ambulation, bathing and hygiene, feeding, continence, grooming, toileting and dressing   Any transportation issues/concerns?: no   Any patient concerns? Yes    Confirmed importance and date/time of follow-up visits scheduled Yes  Provider Appointment booked with Dr.Hunter 10/28/2019  Confirmed with patient if condition begins to worsen call PCP or go to the ER.  Patient was given the office number and encouraged to call back with question or concerns.  : yes

## 2019-10-28 NOTE — Telephone Encounter (Signed)
I can sign-has patient been set up for TCM?  She will need a video visit for the "face-to-face" to be able to sign off on the orders

## 2019-10-28 NOTE — Progress Notes (Signed)
Phone [phone] Virtual visit via Video note   Subjective:  [name] is a 86 y.o. year old very pleasant female patient who presents for transitional care management and hospital follow up for Covid 19. Patient was hospitalized from 10/23/2019 to 10/26/2019. A TCM phone call was completed on 10/28/2019. Medical complexity  High      I have reviewed and summarized emergency room visit from 10/20/2019 as well as hospitalization from October 23, 2019 below as well. patient was originally seen in the emergency room on 10/20/2019.  At that time she had a COVID-19 test that was positive but was thought to be asymptomatic in that regards.  The medical team at that time thought patient was dealing with complications of a UTI and she received Rocephin.  Patient had clearing of mental status with hydration and Rocephin it appears and she was sent home with 10-day course of antibiotics with cefdinir.  Patient had remained in the emergency room for 16 hours and family was concerned about not being able to visit her inside the hospital so they requested discharge home.  On January 2 she returned to the emergency room after she was found to have low oxygen levels at home down to 68% per daughter as well as altered mental status from her baseline and increased lethargy.  Patient had a temperature of 100 and emergency room and was hypertensive.  Chest x-ray showed stable small left pleural effusion.  Blood cultures were drawn but ultimately were negative.  It is possible patient symptoms were related to COVID-19 the entire time-review of urine culture from 10/20/2019 only showed multiple species present without clear UTI and this was from an in and out cath.  Subsequent culture when she returned to the hospital was negative  For COVID-19 patient was treated with remdesivir and Decadron as well as vitamins and supportive care.  Cefdinir was discontinued and patient was placed on ceftriaxone for prior UTI.  Her  respiratory status improved and she was able to come off of oxygen rather quickly.  Altered mental status, fatigue improved with treatment  I independently reviewed chest x-ray from 10/20/2019 as well as October 23, 2019-see below 1.  Chest x-ray 12/30/202 2 views -hyperinflation noted.  Small unchanged small left pleural effusion compared to prior chest x-rays.  Slight cardiomegaly noted.  Possible scarring in left lung base with no opacification of lung fields otherwise.  No bony abnormalities noted.  Aortic atherosclerosis noted 2.  Chest x-ray October 23, 2019 with only one view -hyperinflation again noted.  Once again small unchanged small left pleural effusion.  No opacification of lung fields.  Slight cardiomegaly noted.  Aortic atherosclerosis noted Possible scarring in left lung base with no opacification of lung fields otherwise.  No bony abnormalities.  Aortic atherosclerosis noted  Patient's diabetes is able to be managed with sliding scale insulin.  Hemoglobin A1c was drawn and was 6.9 while hospitalized  There was no evidence of exacerbation of her asthma.  For her Alzheimer she was continued on her home medications as below.  Today, patient and family reports she has finished her remdesivir outpatient treatment.  She is completing her antibiotics for the UTI.  Her husband is COVID-19 positive.  Her daughter who is caring for her is also COVID-19 positive-we reviewed all self isolation guidelines.  The family is working together to administer Decadron.  Patient is sleeping in a hospital bed at home but positioning has been somewhat difficult with this as she continues to seem  to slide down-we discussed frequent readjustment may be required.  Blood pressure did appear to be high during hospitalization and has remained high at home.  Family is going to monitor over the next week or 2 and then update me.  Possible that Decadron is running this higher.  He is to be on metoprolol but with  bradycardia noted in hospital would avoid that  Patient has some deconditioning related to the COVID-19.  Family and patient would like to work with physical therapy to help her get stronger.  Hospitalist also recommended CBC and CMP after discharge-may think that will be difficult to get her into the office for this so we will try to coordinate for blood draw.   See problem oriented charting as well ROS-long-term issues with memory loss.  Cough is improving.  No fever chills  This visit type was conducted due to national recommendations for restrictions regarding the COVID-19 Pandemic (e.g. social distancing).  This format is felt to be most appropriate for this patient at this time balancing risks to patient and risks to population by having him in for in person visit.  No physical exam was performed (except for noted visual exam or audio findings with Telehealth visits).    Our team/I connected with [name] at  4:40 PM EST by a video enabled telemedicine application (doxy.me or caregility through epic) and verified that I am speaking with the correct person using two identifiers.  Location patient: [facility] Location provider: [facility], office Persons participating in the virtual visit:  Patient, daughter, husband  Our team/I discussed the limitations of evaluation and management by telemedicine and the availability of in person appointments. In light of current covid-19 pandemic, patient also understands that we are trying to protect them by minimizing in office contact if at all possible.  The patient expressed consent for telemedicine visit and agreed to proceed. Patient understands insurance will be billed.   Past Medical History-  Patient Active Problem List   Diagnosis Date Noted  . History of DVT in adulthood 06/26/2017    Priority: High  . Coronary artery calcification seen on CAT scan 08/28/2016    Priority: High  . Alzheimer's disease ([place]) 01/04/2016    Priority: High  .  Diabetes ([place]) 12/18/2006    Priority: High  . Atherosclerosis of native arteries of extremity with intermittent claudication ([place]) 12/18/2006    Priority: High  . Hyperlipidemia associated with type 2 diabetes mellitus ([place]) 10/30/2019    Priority: Medium  . Generalized weakness 05/02/2018    Priority: Medium  . Pancreas cyst 07/02/2015    Priority: Medium  . Asthma, chronic 03/16/2015    Priority: Medium  . Primary open angle glaucoma of left eye, moderate stage 12/25/2014    Priority: Medium  . Hypertension associated with diabetes ([place]) 12/18/2006    Priority: Medium  . Bradycardia with 51-60 beats per minute 10/24/2019    Priority: Low  . GERD (gastroesophageal reflux disease) 05/02/2018    Priority: Low  . LBBB (left bundle branch block) 10/09/2016    Priority: Low  . Pleural effusion, left 09/23/2016    Priority: Low  . Pseudophakia of both eyes 02/15/2013    Priority: Low  . Anemia 05/25/2012    Priority: Low  . Astigmatism 04/29/2012    Priority: Low  . Allergic rhinitis 10/30/2010    Priority: Low  . Venous (peripheral) insufficiency 04/13/2007    Priority: Low  . OSTEOARTHRITIS OF SPINE, NOS 12/18/2006    Priority:  Low  . Aortic atherosclerosis ([place]) 10/30/2019  . Altered mental status 10/24/2019  . Essential hypertension 10/24/2019  . COVID-19 virus infection 10/23/2019  . UTI (urinary tract infection) 10/20/2019  . Protein-calorie malnutrition ([place]) 07/21/2018  . Osteoporosis 07/02/2018  . Diabetic ulcer of foot with fat layer exposed ([place]) 05/14/2018    Medications- reviewed and updated  A medical reconciliation was performed comparing current medicines to hospital discharge medications. Current Outpatient Medications  Medication Sig Dispense Refill  . albuterol (PROAIR HFA) 108 (90 Base) MCG/ACT inhaler 2 PUFFS EVERY 6 HOURS AS NEEDED FOR SHORTNESS OF BREATH. (Patient taking differently: Inhale 2 puffs into the lungs every 6 (six) hours as needed for  wheezing or shortness of breath. ) 17 g 5  . albuterol (PROVENTIL) (2.5 MG/3ML) 0.083% nebulizer solution USE THREE MILLILITERS VIA NEBULIZATION BY MOUTH EVERY 6 HOURS AS NEEDED FOR WHEEZING OR SHORTNESS OF BREATH (Patient taking differently: Take 2.5 mg by nebulization daily. ) 300 mL 1  . amoxicillin-clavulanate (AUGMENTIN) 875-125 MG tablet Take 1 tablet by mouth every 12 (twelve) hours for 4 days. 8 tablet 0  . ascorbic acid (VITAMIN C) 500 MG tablet Take 500 mg by mouth 2 (two) times daily.    Marland Kitchen aspirin 81 MG tablet Take 81 mg by mouth daily.    . brimonidine (ALPHAGAN P) 0.1 % SOLN Place 1 drop into both eyes 2 (two) times daily.     . budesonide (PULMICORT) 0.25 MG/2ML nebulizer solution Take 2 mLs (0.25 mg total) by nebulization daily. ICD10 Code-J45.909 (Patient taking differently: Take 0.25 mg by nebulization at bedtime. ICD10 Code-J45.909) 60 mL 12  . calcium carbonate (OS-CAL) 600 MG TABS Take 600 mg by mouth 2 (two) times daily with a meal.    . citalopram (CELEXA) 10 MG tablet TAKE 1 TABLET ONCE DAILY. (Patient taking differently: Take 10 mg by mouth daily. ) 90 tablet 0  . dexamethasone (DECADRON) 6 MG tablet Take 1 tablet (6 mg total) by mouth daily for 7 days. 7 tablet 0  . donepezil (ARICEPT) 10 MG tablet TAKE ONE TABLET AT BEDTIME. (Patient taking differently: Take 10 mg by mouth at bedtime. ) 90 tablet 3  . famotidine (PEPCID) 20 MG tablet TAKE ONE TABLET TWICE A DAY AS NEEDED FOR HEARTBURN OR INDIGESTION. (Patient taking differently: Take 20 mg by mouth daily as needed for heartburn or indigestion. ) 120 tablet 0  . ferrous sulfate 325 (65 FE) MG EC tablet Take 325 mg by mouth daily with breakfast.     . memantine (NAMENDA) 10 MG tablet TAKE 1 TABLET TWICE DAILY FOR MEMORY. (Patient taking differently: Take 10 mg by mouth 2 (two) times daily. ) 180 tablet 3  . Multiple Vitamin (MULTIVITAMIN WITH MINERALS) TABS tablet Take 1 tablet by mouth daily.    . mupirocin ointment  (BACTROBAN) 2 % Apply 1 application topically 2 (two) times daily. Apply to the affected area 2 times a day 22 g 3  . silver sulfADIAZINE (SILVADENE) 1 % cream Apply 1 application topically daily. 50 g 0  . Wound Dressings (MEDIHONEY WOUND/BURN DRESSING) GEL Apply to affected are 3 times a week, and cover with sterile dressing. 30 mL 5   No current facility-administered medications for this visit.   Objective  Objective:  BP (!) 161/86   Pulse 65   Ht 5\' 6"  (1.676 m)   Wt 114 lb (51.7 kg)   LMP  (LMP Unknown)   BMI 18.40 kg/m  Gen: NAD, resting comfortably  in hospital bed No signs of respiratory distress such as increased respiratory rate Neuro: Patient with baseline level of confusion-repeats herself and does not recall all recent information such as hospitalization   Assessment and Plan:   COVID-19 virus infection Patient appears to be improving from hospitalization.  She has completed remdesivir and is in the process of completing Decadron.  She does have some generalized weakness related to COVID-19 infection and we will enlist the aid of home health for physical therapy and Occupational Therapy.  She currently does not have a lot of strength to get to the office for repeat CBC and CMP requested by hospitalist-I ordered these in case she does feel stronger next week but we also ordered home health nursing to see if a blood draw can be completed on the 13th or 14th of next week and sent to [place].  Protein-calorie malnutrition ([place]) Patient noted to be malnourished during hospitalization.  Family is to continue to promote intake.  Hopefully we can get some protein supplementation such as Ensure or boost into her as well-we will touch base with family next visit  Diabetes ([place]) Diabetes was well controlled in hospital with A1c 6.9.  We stopped Metformin in 2019 due to weight loss.  We discussed as long as A1c remains below 8 staying off medication for diabetes-family is in agreement -Does  have history of leg wounds/ulceration/toe wounds and has been at risk for amputation with peripheral vascular disease listed by Dr. Sharol Given.  Family denies any current ulcerations.  Encouraged them to monitor.  Protein calorie malnutrition also contributes to wounds as well as sedentary activity.  Aortic atherosclerosis ([place]) Incidental finding on imaging-chest x-ray.  Continue risk factor modification-we do need to get blood pressure down.  At next visit could discuss potential statin therapy especially with concerns for peripheral vascular disease from Dr. Sharol Given  Hypertension associated with diabetes [facility]) Patient was previously taken off all blood pressure medication-used to be on metoprolol.  Had bradycardia in the hospital so this would not be ideal to restart.  Blood pressures were elevated in the hospital and at home today.  Family is going to do home monitoring and update me in 1 to 2 weeks.  If blood pressure does not trend back down after she comes off Decadron we may need to consider therapy such as amlodipine 1.25 mg even-would like to keep systolic blood pressure under 150 at least though obviously with diabetes and possible peripheral vascular disease and aortic atherosclerosis under 140 would be more ideal  Hyperlipidemia associated with type 2 diabetes mellitus ([place]) Noted LDL above 70 after visit.  We are going to discuss at next visit possibility of statin therapy to keep LDL under 70 with history of aortic atherosclerosis, coronary calcifications on prior CT scan, diabetes  In the past we have opted to hold off on statin therapy due to worsening dementia  UTI (urinary tract infection) Based on urine culture results I told family there was not a clear UTI.  They would like to complete antibiotics to be on the cautious side.  Altered mental status Resolved with treatment of COVID-19 and possible UTI.  She is back to her baseline level of dementia  Alzheimer's disease ([place]) Once again  patient back to baseline mental status.  She remains on Aricept and Namenda-these will be continued   Recommended follow up: We did not discuss specific follow-up but would be reasonable to check back and within 1 to 3 months or sooner if needed  Lab/Order associations:   ICD-10-CM   1. COVID-19 virus infection  U07.1 Ambulatory referral to [place]    CBC with Differential future    CMET future  2. Type 2 diabetes mellitus with foot ulcer, without long-term current use of insulin ([place])  E11.621    L97.509   3. Hypertension associated with diabetes ([place])  E11.59    I10   4. Hyperlipidemia associated with type 2 diabetes mellitus ([place])  E11.69    E78.5   5. Aortic atherosclerosis ([facility])  I70.0   6. Mild protein-calorie malnutrition ([facility]) Chronic E44.1   7. PVD (peripheral vascular disease) ([facility]) Chronic I73.9   8. Generalized weakness  R53.1 Ambulatory referral to Home Health   Return precautions advised.  Garret Reddish, MD

## 2019-10-28 NOTE — Telephone Encounter (Signed)
Noted thanks-areas of concern addressed today

## 2019-10-28 NOTE — Telephone Encounter (Signed)
Scheduled appt for pt to see Dr. Yong Channel at [phone] this afternoon at 1640.

## 2019-10-28 NOTE — Telephone Encounter (Signed)
Please call patient to set up virtual visit as soon as we can please check to make sure tcm call has been made.

## 2019-10-30 DIAGNOSIS — I700 Atherosclerosis of aorta: Secondary | ICD-10-CM | POA: Insufficient documentation

## 2019-10-30 DIAGNOSIS — E1169 Type 2 diabetes mellitus with other specified complication: Secondary | ICD-10-CM | POA: Insufficient documentation

## 2019-10-30 DIAGNOSIS — E785 Hyperlipidemia, unspecified: Secondary | ICD-10-CM | POA: Insufficient documentation

## 2019-10-30 NOTE — Assessment & Plan Note (Addendum)
Noted LDL above 70 after visit.  We are going to discuss at next visit possibility of statin therapy to keep LDL under 70 with history of aortic atherosclerosis, coronary calcifications on prior CT scan, diabetes  In the past we have opted to hold off on statin therapy due to worsening dementia

## 2019-10-30 NOTE — Assessment & Plan Note (Signed)
Incidental finding on imaging-chest x-ray.  Continue risk factor modification-we do need to get blood pressure down.  At next visit could discuss potential statin therapy especially with concerns for peripheral vascular disease from Dr. Sharol Given

## 2019-10-30 NOTE — Assessment & Plan Note (Signed)
Patient appears to be improving from hospitalization.  She has completed remdesivir and is in the process of completing Decadron.  She does have some generalized weakness related to COVID-19 infection and we will enlist the aid of home health for physical therapy and Occupational Therapy.  She currently does not have a lot of strength to get to the office for repeat CBC and CMP requested by hospitalist-I ordered these in case she does feel stronger next week but we also ordered home health nursing to see if a blood draw can be completed on the 13th or 14th of next week and sent to [place].

## 2019-10-30 NOTE — Assessment & Plan Note (Signed)
Resolved with treatment of COVID-19 and possible UTI.  She is back to her baseline level of dementia

## 2019-10-30 NOTE — Assessment & Plan Note (Signed)
Once again patient back to baseline mental status.  She remains on Aricept and Namenda-these will be continued

## 2019-10-30 NOTE — Assessment & Plan Note (Signed)
Based on urine culture results I told family there was not a clear UTI.  They would like to complete antibiotics to be on the cautious side.

## 2019-10-30 NOTE — Assessment & Plan Note (Signed)
Patient noted to be malnourished during hospitalization.  Family is to continue to promote intake.  Hopefully we can get some protein supplementation such as Ensure or boost into her as well-we will touch base with family next visit

## 2019-10-30 NOTE — Assessment & Plan Note (Addendum)
Diabetes was well controlled in hospital with A1c 6.9.  We stopped Metformin in 2019 due to weight loss.  We discussed as long as A1c remains below 8 staying off medication for diabetes-family is in agreement -Does have history of leg wounds/ulceration/toe wounds and has been at risk for amputation with peripheral vascular disease listed by Dr. Sharol Given.  Family denies any current ulcerations.  Encouraged them to monitor.  Protein calorie malnutrition also contributes to wounds as well as sedentary activity.

## 2019-10-30 NOTE — Assessment & Plan Note (Signed)
Patient was previously taken off all blood pressure medication-used to be on metoprolol.  Had bradycardia in the hospital so this would not be ideal to restart.  Blood pressures were elevated in the hospital and at home today.  Family is going to do home monitoring and update me in 1 to 2 weeks.  If blood pressure does not trend back down after she comes off Decadron we may need to consider therapy such as amlodipine 1.25 mg even-would like to keep systolic blood pressure under 150 at least though obviously with diabetes and possible peripheral vascular disease and aortic atherosclerosis under 140 would be more ideal

## 2019-11-03 ENCOUNTER — Other Ambulatory Visit: Payer: Self-pay

## 2019-11-03 ENCOUNTER — Emergency Department (HOSPITAL_COMMUNITY): Payer: Medicare Other

## 2019-11-03 ENCOUNTER — Encounter (HOSPITAL_COMMUNITY): Payer: Self-pay

## 2019-11-03 ENCOUNTER — Inpatient Hospital Stay (HOSPITAL_COMMUNITY)
Admission: EM | Admit: 2019-11-03 | Discharge: 2019-11-07 | DRG: 177 | Disposition: A | Discharge status: Home Health | Payer: Medicare Other | Attending: Internal Medicine | Admitting: Internal Medicine

## 2019-11-03 ENCOUNTER — Telehealth: Payer: Self-pay

## 2019-11-03 DIAGNOSIS — E1159 Type 2 diabetes mellitus with other circulatory complications: Secondary | ICD-10-CM | POA: Diagnosis present

## 2019-11-03 DIAGNOSIS — D72825 Bandemia: Secondary | ICD-10-CM | POA: Diagnosis present

## 2019-11-03 DIAGNOSIS — D72829 Elevated white blood cell count, unspecified: Secondary | ICD-10-CM

## 2019-11-03 DIAGNOSIS — G9341 Metabolic encephalopathy: Secondary | ICD-10-CM | POA: Diagnosis present

## 2019-11-03 DIAGNOSIS — R0902 Hypoxemia: Secondary | ICD-10-CM | POA: Diagnosis not present

## 2019-11-03 DIAGNOSIS — R739 Hyperglycemia, unspecified: Secondary | ICD-10-CM

## 2019-11-03 DIAGNOSIS — N179 Acute kidney failure, unspecified: Secondary | ICD-10-CM | POA: Diagnosis present

## 2019-11-03 DIAGNOSIS — G9340 Encephalopathy, unspecified: Secondary | ICD-10-CM | POA: Diagnosis not present

## 2019-11-03 DIAGNOSIS — E785 Hyperlipidemia, unspecified: Secondary | ICD-10-CM | POA: Diagnosis present

## 2019-11-03 DIAGNOSIS — H409 Unspecified glaucoma: Secondary | ICD-10-CM | POA: Diagnosis present

## 2019-11-03 DIAGNOSIS — E870 Hyperosmolality and hypernatremia: Secondary | ICD-10-CM | POA: Diagnosis present

## 2019-11-03 DIAGNOSIS — U071 COVID-19: Secondary | ICD-10-CM | POA: Diagnosis not present

## 2019-11-03 DIAGNOSIS — K219 Gastro-esophageal reflux disease without esophagitis: Secondary | ICD-10-CM | POA: Diagnosis present

## 2019-11-03 DIAGNOSIS — L8990 Pressure ulcer of unspecified site, unspecified stage: Secondary | ICD-10-CM | POA: Insufficient documentation

## 2019-11-03 DIAGNOSIS — F0280 Dementia in other diseases classified elsewhere without behavioral disturbance: Secondary | ICD-10-CM | POA: Diagnosis present

## 2019-11-03 DIAGNOSIS — M6282 Rhabdomyolysis: Secondary | ICD-10-CM | POA: Diagnosis not present

## 2019-11-03 DIAGNOSIS — L89156 Pressure-induced deep tissue damage of sacral region: Secondary | ICD-10-CM | POA: Diagnosis present

## 2019-11-03 DIAGNOSIS — R109 Unspecified abdominal pain: Secondary | ICD-10-CM | POA: Diagnosis not present

## 2019-11-03 DIAGNOSIS — E119 Type 2 diabetes mellitus without complications: Secondary | ICD-10-CM

## 2019-11-03 DIAGNOSIS — E860 Dehydration: Secondary | ICD-10-CM | POA: Diagnosis present

## 2019-11-03 DIAGNOSIS — Z86718 Personal history of other venous thrombosis and embolism: Secondary | ICD-10-CM

## 2019-11-03 DIAGNOSIS — J90 Pleural effusion, not elsewhere classified: Secondary | ICD-10-CM | POA: Diagnosis not present

## 2019-11-03 DIAGNOSIS — E871 Hypo-osmolality and hyponatremia: Secondary | ICD-10-CM | POA: Diagnosis not present

## 2019-11-03 DIAGNOSIS — E1169 Type 2 diabetes mellitus with other specified complication: Secondary | ICD-10-CM | POA: Diagnosis present

## 2019-11-03 DIAGNOSIS — R000 Tachycardia, unspecified: Secondary | ICD-10-CM | POA: Diagnosis not present

## 2019-11-03 DIAGNOSIS — E1165 Type 2 diabetes mellitus with hyperglycemia: Secondary | ICD-10-CM | POA: Diagnosis present

## 2019-11-03 DIAGNOSIS — R4182 Altered mental status, unspecified: Secondary | ICD-10-CM | POA: Diagnosis not present

## 2019-11-03 DIAGNOSIS — E876 Hypokalemia: Secondary | ICD-10-CM | POA: Diagnosis present

## 2019-11-03 DIAGNOSIS — I152 Hypertension secondary to endocrine disorders: Secondary | ICD-10-CM | POA: Diagnosis present

## 2019-11-03 DIAGNOSIS — L89312 Pressure ulcer of right buttock, stage 2: Secondary | ICD-10-CM | POA: Diagnosis present

## 2019-11-03 DIAGNOSIS — I2510 Atherosclerotic heart disease of native coronary artery without angina pectoris: Secondary | ICD-10-CM | POA: Diagnosis present

## 2019-11-03 DIAGNOSIS — L89619 Pressure ulcer of right heel, unspecified stage: Secondary | ICD-10-CM | POA: Diagnosis present

## 2019-11-03 DIAGNOSIS — R404 Transient alteration of awareness: Secondary | ICD-10-CM | POA: Diagnosis not present

## 2019-11-03 DIAGNOSIS — G309 Alzheimer's disease, unspecified: Secondary | ICD-10-CM | POA: Diagnosis present

## 2019-11-03 LAB — CBC WITH DIFFERENTIAL/PLATELET
Abs Immature Granulocytes: 0.19 10*3/uL — ABNORMAL HIGH (ref 0.00–0.07)
Basophils Absolute: 0 10*3/uL (ref 0.0–0.1)
Basophils Relative: 0 %
Eosinophils Absolute: 0 10*3/uL (ref 0.0–0.5)
Eosinophils Relative: 0 %
HCT: 47.9 % — ABNORMAL HIGH (ref 36.0–46.0)
Hemoglobin: 15.9 g/dL — ABNORMAL HIGH (ref 12.0–15.0)
Immature Granulocytes: 1 %
Lymphocytes Relative: 3 %
Lymphs Abs: 0.8 10*3/uL (ref 0.7–4.0)
MCH: 33.1 pg (ref 26.0–34.0)
MCHC: 33.2 g/dL (ref 30.0–36.0)
MCV: 99.6 fL (ref 80.0–100.0)
Monocytes Absolute: 1.1 10*3/uL — ABNORMAL HIGH (ref 0.1–1.0)
Monocytes Relative: 5 %
Neutro Abs: 21.9 10*3/uL — ABNORMAL HIGH (ref 1.7–7.7)
Neutrophils Relative %: 91 %
Platelets: 202 10*3/uL (ref 150–400)
RBC: 4.81 MIL/uL (ref 3.87–5.11)
RDW: 13 % (ref 11.5–15.5)
WBC: 24 10*3/uL — ABNORMAL HIGH (ref 4.0–10.5)
nRBC: 0 % (ref 0.0–0.2)

## 2019-11-03 LAB — COMPREHENSIVE METABOLIC PANEL
ALT: 20 U/L (ref 0–44)
AST: 21 U/L (ref 15–41)
Albumin: 3.7 g/dL (ref 3.5–5.0)
Alkaline Phosphatase: 66 U/L (ref 38–126)
Anion gap: 14 (ref 5–15)
BUN: 47 mg/dL — ABNORMAL HIGH (ref 8–23)
CO2: 26 mmol/L (ref 22–32)
Calcium: 9.9 mg/dL (ref 8.9–10.3)
Chloride: 105 mmol/L (ref 98–111)
Creatinine, Ser: 1.15 mg/dL — ABNORMAL HIGH (ref 0.44–1.00)
GFR calc Af Amer: 50 mL/min — ABNORMAL LOW (ref >60)
GFR calc non Af Amer: 43 mL/min — ABNORMAL LOW (ref >60)
Glucose, Bld: 515 mg/dL (ref 70–99)
Potassium: 3.8 mmol/L (ref 3.5–5.1)
Sodium: 145 mmol/L (ref 135–145)
Total Bilirubin: 1.5 mg/dL — ABNORMAL HIGH (ref 0.3–1.2)
Total Protein: 7 g/dL (ref 6.5–8.1)

## 2019-11-03 LAB — URINALYSIS, ROUTINE W REFLEX MICROSCOPIC
Bacteria, UA: NONE SEEN
Bilirubin Urine: NEGATIVE
Glucose, UA: >=500 mg/dL — AB
Hgb urine dipstick: NEGATIVE
Ketones, ur: 20 mg/dL — AB
Nitrite: NEGATIVE
Protein, ur: 30 mg/dL — AB
Specific Gravity, Urine: 1.028 (ref 1.005–1.030)
pH: 5 (ref 5.0–8.0)

## 2019-11-03 LAB — RAPID URINE DRUG SCREEN, HOSP PERFORMED
Amphetamines: NOT DETECTED
Barbiturates: NOT DETECTED
Benzodiazepines: NOT DETECTED
Cocaine: NOT DETECTED
Opiates: NOT DETECTED
Tetrahydrocannabinol: NOT DETECTED

## 2019-11-03 LAB — LIPASE, BLOOD: Lipase: 17 U/L (ref 11–51)

## 2019-11-03 LAB — LACTIC ACID, PLASMA
Lactic Acid, Venous: 1.1 mmol/L (ref 0.5–1.9)
Lactic Acid, Venous: 1.6 mmol/L (ref 0.5–1.9)

## 2019-11-03 LAB — CBG MONITORING, ED
Glucose-Capillary: 476 mg/dL — ABNORMAL HIGH (ref 70–99)
Glucose-Capillary: 355 mg/dL — ABNORMAL HIGH (ref 70–99)
Glucose-Capillary: 222 mg/dL — ABNORMAL HIGH (ref 70–99)

## 2019-11-03 LAB — ETHANOL: Alcohol, Ethyl (B): <10 mg/dL (ref <10)

## 2019-11-03 MED ORDER — INSULIN ASPART 100 UNIT/ML ~~LOC~~ SOLN
0.0000 [IU] | Freq: Every day | SUBCUTANEOUS | Status: DC
  Administered 2019-11-03: 2 [IU] via SUBCUTANEOUS
  Administered 2019-11-05: 3 [IU] via SUBCUTANEOUS
  Filled 2019-11-03: qty 0.05

## 2019-11-03 MED ORDER — BRIMONIDINE TARTRATE 0.15 % OP SOLN
1.0000 [drp] | Freq: Two times a day (BID) | OPHTHALMIC | Status: DC
  Administered 2019-11-04 – 2019-11-06 (×6): 1 [drp] via OPHTHALMIC
  Filled 2019-11-03 (×2): qty 5

## 2019-11-03 MED ORDER — FERROUS SULFATE 325 (65 FE) MG PO TABS
325.0000 mg | ORAL_TABLET | Freq: Every day | ORAL | Status: DC
  Administered 2019-11-04 – 2019-11-07 (×4): 325 mg via ORAL
  Filled 2019-11-03 (×4): qty 1

## 2019-11-03 MED ORDER — ACETAMINOPHEN 650 MG RE SUPP: 650.0000 mg | Freq: Four times a day (QID) | RECTAL | Status: DC | PRN

## 2019-11-03 MED ORDER — ONDANSETRON HCL 4 MG PO TABS: 4.0000 mg | ORAL_TABLET | Freq: Four times a day (QID) | ORAL | Status: DC | PRN

## 2019-11-03 MED ORDER — MEMANTINE HCL 10 MG PO TABS
10.0000 mg | ORAL_TABLET | Freq: Two times a day (BID) | ORAL | Status: DC
  Administered 2019-11-04 – 2019-11-07 (×8): 10 mg via ORAL
  Filled 2019-11-03 (×9): qty 1

## 2019-11-03 MED ORDER — ENOXAPARIN SODIUM 30 MG/0.3ML ~~LOC~~ SOLN
30.0000 mg | Freq: Every day | SUBCUTANEOUS | Status: DC
  Administered 2019-11-04 (×2): 30 mg via SUBCUTANEOUS
  Filled 2019-11-03 (×2): qty 0.3

## 2019-11-03 MED ORDER — INSULIN ASPART 100 UNIT/ML ~~LOC~~ SOLN
0.0000 [IU] | Freq: Three times a day (TID) | SUBCUTANEOUS | Status: DC
  Administered 2019-11-03: 9 [IU] via SUBCUTANEOUS
  Administered 2019-11-04 (×2): 3 [IU] via SUBCUTANEOUS
  Administered 2019-11-05: 1 [IU] via SUBCUTANEOUS
  Administered 2019-11-05 – 2019-11-06 (×3): 2 [IU] via SUBCUTANEOUS
  Administered 2019-11-06: 1 [IU] via SUBCUTANEOUS
  Administered 2019-11-07: 2 [IU] via SUBCUTANEOUS
  Filled 2019-11-03: qty 0.09

## 2019-11-03 MED ORDER — ASPIRIN EC 81 MG PO TBEC
81.0000 mg | DELAYED_RELEASE_TABLET | Freq: Every day | ORAL | Status: DC
  Administered 2019-11-04 – 2019-11-07 (×5): 81 mg via ORAL
  Filled 2019-11-03 (×5): qty 1

## 2019-11-03 MED ORDER — SODIUM CHLORIDE 0.9 % IV BOLUS
1000.0000 mL | Freq: Once | INTRAVENOUS | Status: AC
  Administered 2019-11-03: 1000 mL via INTRAVENOUS

## 2019-11-03 MED ORDER — ACETAMINOPHEN 325 MG PO TABS
650.0000 mg | ORAL_TABLET | Freq: Four times a day (QID) | ORAL | Status: DC | PRN
  Administered 2019-11-05 – 2019-11-06 (×2): 650 mg via ORAL
  Filled 2019-11-03 (×2): qty 2

## 2019-11-03 MED ORDER — DONEPEZIL HCL 10 MG PO TABS
10.0000 mg | ORAL_TABLET | Freq: Every day | ORAL | Status: DC
  Administered 2019-11-04 – 2019-11-06 (×4): 10 mg via ORAL
  Filled 2019-11-03 (×3): qty 1
  Filled 2019-11-03: qty 2

## 2019-11-03 MED ORDER — BUDESONIDE 180 MCG/ACT IN AEPB
2.0000 | INHALATION_SPRAY | Freq: Every day | RESPIRATORY_TRACT | Status: DC
  Administered 2019-11-05: 2 via RESPIRATORY_TRACT
  Filled 2019-11-03: qty 1

## 2019-11-03 MED ORDER — INSULIN DETEMIR 100 UNIT/ML ~~LOC~~ SOLN
5.0000 [IU] | Freq: Every day | SUBCUTANEOUS | Status: DC
  Administered 2019-11-03: 5 [IU] via SUBCUTANEOUS
  Filled 2019-11-03 (×2): qty 0.05

## 2019-11-03 MED ORDER — CITALOPRAM HYDROBROMIDE 20 MG PO TABS
10.0000 mg | ORAL_TABLET | Freq: Every day | ORAL | Status: DC
  Administered 2019-11-04 – 2019-11-07 (×5): 10 mg via ORAL
  Filled 2019-11-03 (×5): qty 1

## 2019-11-03 MED ORDER — ASCORBIC ACID 500 MG PO TABS
500.0000 mg | ORAL_TABLET | Freq: Two times a day (BID) | ORAL | Status: DC
  Administered 2019-11-04 – 2019-11-07 (×8): 500 mg via ORAL
  Filled 2019-11-03 (×8): qty 1

## 2019-11-03 MED ORDER — IOHEXOL 300 MG/ML  SOLN
100.0000 mL | Freq: Once | INTRAMUSCULAR | Status: AC | PRN
  Administered 2019-11-03: 100 mL via INTRAVENOUS

## 2019-11-03 MED ORDER — BUDESONIDE 0.25 MG/2ML IN SUSP
0.2500 mg | Freq: Every day | RESPIRATORY_TRACT | Status: DC
  Filled 2019-11-03: qty 2

## 2019-11-03 MED ORDER — CALCIUM CARBONATE 1250 (500 CA) MG PO TABS
1250.0000 mg | ORAL_TABLET | Freq: Two times a day (BID) | ORAL | Status: DC
  Administered 2019-11-04 – 2019-11-07 (×6): 1250 mg via ORAL
  Filled 2019-11-03 (×7): qty 1

## 2019-11-03 MED ORDER — ALBUTEROL SULFATE HFA 108 (90 BASE) MCG/ACT IN AERS: 2.0000 | INHALATION_SPRAY | Freq: Four times a day (QID) | RESPIRATORY_TRACT | Status: DC | PRN

## 2019-11-03 MED ORDER — ONDANSETRON HCL 4 MG/2ML IJ SOLN: 4.0000 mg | Freq: Four times a day (QID) | INTRAMUSCULAR | Status: DC | PRN

## 2019-11-03 MED ORDER — SODIUM CHLORIDE 0.9 % IV SOLN: INTRAVENOUS | Status: DC

## 2019-11-03 MED ORDER — ADULT MULTIVITAMIN W/MINERALS CH
1.0000 | ORAL_TABLET | Freq: Every day | ORAL | Status: DC
  Administered 2019-11-04 – 2019-11-07 (×4): 1 via ORAL
  Filled 2019-11-03 (×5): qty 1

## 2019-11-03 MED ORDER — FAMOTIDINE 20 MG PO TABS
20.0000 mg | ORAL_TABLET | Freq: Every day | ORAL | Status: DC
  Administered 2019-11-04 – 2019-11-07 (×5): 20 mg via ORAL
  Filled 2019-11-03 (×5): qty 1

## 2019-11-03 NOTE — ED Triage Notes (Signed)
EMS called by family saying patient was "unconscious" but per EMS - patient was just lethargic and responsive to painful stimuli  Hx: dementia, speaks with confusion Had COVID (out of 14 day window per daughter) and UTI CBG 438, hx: diabetes - per family... they took her off of metformin because it was "causing her to be prediabetic" :) and does not take insulin   90 HR 190/100 - hx: HTN has not taken meds 98% 2L [place] 89% RA 28 R  20 L FA 200 cc fluid otw

## 2019-11-03 NOTE — ED Notes (Signed)
Date and time results received: 11/03/19 1:20 PM   Test: Glucose Critical Value: 515

## 2019-11-03 NOTE — ED Notes (Addendum)
Patient meets ALONE criteria. Daughter at bedside.

## 2019-11-03 NOTE — H&P (Signed)
History and Physical    [name] [id number] DOB: [birth year] DOA: 11/03/2019  PCP: Marin Olp, MD  Patient coming from: Home.  I have personally briefly reviewed patient's old medical records available.   Chief Complaint: Lethargic and unresponsiveness x 1 day   HPI: [name] is a 86 y.o. female with medical history significant of advanced dementia, asthma, CAD, hypertension type 2 diabetes not treated, recently treated COVID-19 infection and UTI, discharged on dexamethasone presenting back to the emergency room with lethargy and altered mental status.  Patient is unable to give any history.  According to the patient's daughter, since she developed Covid infection she has been more weaker and impaired mobility at home.  Patient does have history of diabetes, she used to be on Metformin, 8 months ago her A1c was 6.4 and she was taken off it.  Her A1c was 6.9 last week.  Received total 10 days of Decadron, last dose probably 2 days ago.  Since last 4 days, daughter reported to her being eating less, sleeping more.  Lives at home with daughter and her husband.  Daughter called EMS because her blood sugar was more than 500 and she was more lethargic and her primary care physician suggested so. Patient reportedly was not really unresponsive as per family.  She was more lethargic. Patient is unable to give any history.  She can hardly open her eyes and follow commands. No reported fever.  She has been checking oxygen at home and remains at about 91% without oxygen.  Does have some dry cough. COVID-19 was diagnosed on 12/30. ED Course: Blood pressures elevated.  100% on 2 L oxygen, she will probably not need oxygen.  WBC count 24,000.  Glucose 515, anion gap 14.  Urinalysis normal.  Afebrile.  WBC count 24,000.  Head CT stable.  CT scan abdomen pelvis with no evidence of acute abnormalities.  Chest x-ray with no evidence of pneumonia. Patient was given 2 L of isotonic fluid in the  ER.  Review of Systems: all systems are reviewed and pertinent positive as per HPI otherwise rest are negative.    Past Medical History:  Diagnosis Date  . Alzheimer's dementia without behavioral disturbance ([place])    diagnosed 12/2015; Dr. Jade/neurology  . Anemia   . Asthma   . Blood transfusion 05-24-12  . Blood transfusion without reported diagnosis   . Chronic kidney disease    dr Clover Mealy  . Diabetes mellitus   . Diverticulosis   . DVT (deep venous thrombosis) ([place]) 03/2005   hx.left leg  . Esophageal stricture   . GERD (gastroesophageal reflux disease)   . Glaucoma   . Hiatal hernia 05/07/13  . Hyperlipidemia   . Hypertension   . Osteoarthritis   . Pancreatic duct dilated 05/07/13  . Pulmonary nodules 05/07/13  . PVD (peripheral vascular disease) ([place])   . Tubular adenoma of colon     Past Surgical History:  Procedure Laterality Date  . CATARACT EXTRACTION, BILATERAL    . COLONOSCOPY  05/27/2012   Procedure: COLONOSCOPY;  Surgeon: Lafayette Dragon, MD;  Location: [facility] ENDOSCOPY;  Service: Endoscopy;  Laterality: N/A;  . ESOPHAGOGASTRODUODENOSCOPY  05/26/2012   Procedure: ESOPHAGOGASTRODUODENOSCOPY (EGD);  Surgeon: Lafayette Dragon, MD;  Location: [facility];  Service: Endoscopy;  Laterality: N/A;  . EUS N/A 07/08/2013   Procedure: UPPER ENDOSCOPIC ULTRASOUND (EUS) LINEAR;  Surgeon: Milus Banister, MD;  Location: [facility] ENDOSCOPY;  Service: Endoscopy;  Laterality: N/A;  need side view scope  .  EYE SURGERY    . NM MYOVIEW LTD  10/2016   Read as intermediate risk due to EF of 38%.Anemia or infarction noted. Only abnormal septal WM.   Marland Kitchen TRANSTHORACIC ECHOCARDIOGRAM  10/2016    EF 45-50% with possible inferior hypokinesis. Aortic sclerosis without stenosis (ordered to reassess EF from Myoview)  . VASCULAR SURGERY  09/2004  . WRIST FRACTURE SURGERY     R wrist fracture, plate     reports that she has never smoked. She has never used smokeless tobacco. She reports that she does not  drink alcohol or use drugs.  No Known Allergies  Family History  Adopted: Yes  Problem Relation Age of Onset  . Migraines Daughter   . Cancer Son   . Colon cancer Neg Hx      Prior to Admission medications   Medication Sig Start Date End Date Taking? Authorizing Provider  albuterol (PROAIR HFA) 108 (90 Base) MCG/ACT inhaler 2 PUFFS EVERY 6 HOURS AS NEEDED FOR SHORTNESS OF BREATH. Patient taking differently: Inhale 2 puffs into the lungs every 6 (six) hours as needed for wheezing or shortness of breath.  12/18/18  Yes Marin Olp, MD  albuterol (PROVENTIL) (2.5 MG/3ML) 0.083% nebulizer solution USE THREE MILLILITERS VIA NEBULIZATION BY MOUTH EVERY 6 HOURS AS NEEDED FOR WHEEZING OR SHORTNESS OF BREATH Patient taking differently: Take 2.5 mg by nebulization daily.  12/08/18  Yes Marin Olp, MD  ascorbic acid (VITAMIN C) 500 MG tablet Take 500 mg by mouth 2 (two) times daily.   Yes [provider]  aspirin 81 MG tablet Take 81 mg by mouth daily.   Yes [provider]  brimonidine (ALPHAGAN P) 0.1 % SOLN Place 1 drop into both eyes 2 (two) times daily.  04/24/16  Yes [provider]  budesonide (PULMICORT) 0.25 MG/2ML nebulizer solution Take 2 mLs (0.25 mg total) by nebulization daily. ICD10 Code-J45.909 Patient taking differently: Take 0.25 mg by nebulization at bedtime. ICD10 Code-J45.909 12/08/18  Yes Marin Olp, MD  calcium carbonate (OS-CAL) 600 MG TABS Take 600 mg by mouth 2 (two) times daily with a meal.   Yes [provider]  citalopram (CELEXA) 10 MG tablet TAKE 1 TABLET ONCE DAILY. Patient taking differently: Take 10 mg by mouth daily.  09/24/19  Yes Marin Olp, MD  donepezil (ARICEPT) 10 MG tablet TAKE ONE TABLET AT BEDTIME. Patient taking differently: Take 10 mg by mouth at bedtime.  06/01/19  Yes Marin Olp, MD  famotidine (PEPCID) 20 MG tablet TAKE ONE TABLET TWICE A DAY AS NEEDED FOR HEARTBURN OR  INDIGESTION. Patient taking differently: Take 20 mg by mouth daily.  09/24/19  Yes Marin Olp, MD  ferrous sulfate 325 (65 FE) MG EC tablet Take 325 mg by mouth daily with breakfast.    Yes [provider]  memantine (NAMENDA) 10 MG tablet TAKE 1 TABLET TWICE DAILY FOR MEMORY. Patient taking differently: Take 10 mg by mouth 2 (two) times daily.  10/04/19  Yes Pieter Partridge, DO  Multiple Vitamin (MULTIVITAMIN WITH MINERALS) TABS tablet Take 1 tablet by mouth daily.   Yes [provider]    Physical Exam: Vitals:   11/03/19 1400 11/03/19 1415 11/03/19 1430 11/03/19 1600  BP: (!) 189/91  (!) 194/86 (!) 182/89  Pulse: 85 86 78 (!) 36  Resp: 18 15 14 17   Temp:      TempSrc:      SpO2: 99% 100% 100% 100%  Constitutional: NAD, calm, comfortable Vitals:   11/03/19 1400 11/03/19 1415 11/03/19 1430 11/03/19 1600  BP: (!) 189/91  (!) 194/86 (!) 182/89  Pulse: 85 86 78 (!) 36  Resp: 18 15 14 17   Temp:      TempSrc:      SpO2: 99% 100% 100% 100%   Eyes: PERRL, lids and conjunctivae normal ENMT: Mucous membranes are dry. Posterior pharynx clear of any exudate or lesions.Normal dentition.  Neck: normal, supple, no masses, no thyromegaly.  Respiratory: clear to auscultation bilaterally, no wheezing, no crackles. No accessory muscle use.  Cardiovascular: Regular rate and rhythm, no murmurs / rubs / gallops. No extremity edema. 2+ pedal pulses. No carotid bruits.  Abdomen: no tenderness, no masses palpated. No hepatosplenomegaly. Bowel sounds positive.  Musculoskeletal: no clubbing / cyanosis. No joint deformity upper and lower extremities. Good ROM, no contractures. Normal muscle tone.  Skin: no rashes, lesions, ulcers. No induration Neurologic: CN 2-12 grossly intact. Sensation intact, DTR normal.  Equal power all extremities, generalized weakness. Psychiatric: Normal judgment and impaired.  Sleepy and lethargic.    Labs on Admission: I have personally reviewed  following labs and imaging studies  CBC: Recent Labs  Lab 11/03/19 1207  WBC 24.0*  NEUTROABS 21.9*  HGB 15.9*  HCT 47.9*  MCV 99.6  PLT [id number]   Basic Metabolic Panel: Recent Labs  Lab 11/03/19 1207  NA 145  K 3.8  CL 105  CO2 26  GLUCOSE 515*  BUN 47*  CREATININE 1.15*  CALCIUM 9.9   GFR: Estimated Creatinine Clearance: 28.7 mL/min (A) (by C-G formula based on SCr of 1.15 mg/dL (H)). Liver Function Tests: Recent Labs  Lab 11/03/19 1207  AST 21  ALT 20  ALKPHOS 66  BILITOT 1.5*  PROT 7.0  ALBUMIN 3.7   No results for input(s): LIPASE, AMYLASE in the last 168 hours. No results for input(s): AMMONIA in the last 168 hours. Coagulation Profile: No results for input(s): INR, PROTIME in the last 168 hours. Cardiac Enzymes: No results for input(s): CKTOTAL, CKMB, CKMBINDEX, TROPONINI in the last 168 hours. BNP (last 3 results) No results for input(s): PROBNP in the last 8760 hours. HbA1C: No results for input(s): HGBA1C in the last 72 hours. CBG: Recent Labs  Lab 11/03/19 1158 11/03/19 1542  GLUCAP 476* 355*   Lipid Profile: No results for input(s): CHOL, HDL, LDLCALC, TRIG, CHOLHDL, LDLDIRECT in the last 72 hours. Thyroid Function Tests: No results for input(s): TSH, T4TOTAL, FREET4, T3FREE, THYROIDAB in the last 72 hours. Anemia Panel: No results for input(s): VITAMINB12, FOLATE, FERRITIN, TIBC, IRON, RETICCTPCT in the last 72 hours. Urine analysis:    Component Value Date/Time   COLORURINE YELLOW 11/03/2019 [place] 11/03/2019 1207   LABSPEC 1.028 11/03/2019 1207   PHURINE 5.0 11/03/2019 1207   GLUCOSEU >=500 (A) 11/03/2019 1207   HGBUR NEGATIVE 11/03/2019 [place] 11/03/2019 1207   BILIRUBINUR Negative 02/12/2019 1019   KETONESUR 20 (A) 11/03/2019 1207   PROTEINUR 30 (A) 11/03/2019 1207   UROBILINOGEN 1.0 02/12/2019 1019   UROBILINOGEN 0.2 05/26/2012 1113   NITRITE NEGATIVE 11/03/2019 1207   LEUKOCYTESUR  SMALL (A) 11/03/2019 1207    Radiological Exams on Admission: CT Head Wo Contrast  Result Date: 11/03/2019 CLINICAL DATA:  EMS called by family saying patient was "unconscious" but per EMS - patient was just lethargic and responsive to painful stimuli Hx: dementia, speaks with confusionHad COVID (out of 14 day window per daughter) and  UTI EXAM: CT HEAD WITHOUT CONTRAST TECHNIQUE: Contiguous axial images were obtained from the base of the skull through the vertex without intravenous contrast. COMPARISON:  10/20/2019 FINDINGS: Brain: No evidence of acute infarction, hemorrhage, hydrocephalus, extra-axial collection or mass lesion/mass effect. Ventricular and sulcal enlargement reflecting age related volume loss. Patchy hypoattenuation in the white matter is noted consistent mild to moderate chronic microvascular ischemic change. Vascular: No hyperdense vessel or unexpected calcification. Skull: Normal. Negative for fracture or focal lesion. Sinuses/Orbits: Globes and orbits are unremarkable. Visualized sinuses and mastoid air cells are clear. Other: None. IMPRESSION: 1. No acute intracranial abnormalities. 2. Age related volume loss. Mild to moderate chronic microvascular ischemic change. Electronically Signed   By: Lajean Manes M.D.   On: 11/03/2019 15:51   CT ABDOMEN PELVIS W CONTRAST  Result Date: 11/03/2019 CLINICAL DATA:  Lethargy.  Dementia.  Abdominal pain.  UTI. EXAM: CT ABDOMEN AND PELVIS WITH CONTRAST TECHNIQUE: Multidetector CT imaging of the abdomen and pelvis was performed using the standard protocol following bolus administration of intravenous contrast. CONTRAST:  167mL OMNIPAQUE IOHEXOL 300 MG/ML  SOLN COMPARISON:  06/18/2013 MRI abdomen. FINDINGS: Scan limited by motion degradation. Lower chest: Several (greater than 5) solid pulmonary nodules scattered at both lung bases, largest 4 mm in the anterior right lower lobe (series 6/image 5), unchanged since 09/20/2013 chest CT, considered  benign. Thick curvilinear bandlike consolidation in the basilar left lower lobe with associated volume loss and distortion and with smooth left pleural thickening, compatible with pleural-parenchymal scarring. Coronary atherosclerosis. Hepatobiliary: Normal liver size. No liver mass. No definite gallbladder wall thickening. No radiopaque cholelithiasis. No biliary ductal dilatation. Pancreas: Diffuse pancreatic parenchymal atrophy. Irregular diffuse pancreatic duct dilation up to 10 mm diameter. Coarse calcification in the pancreatic head. These findings are unchanged since 2014 MRI and compatible with chronic pancreatitis. No discrete pancreatic mass. No peripancreatic fluid collections. Spleen: Normal size. No mass. Adrenals/Urinary Tract: Normal adrenals. Simple 3.8 cm lower left renal cyst. Subcentimeter hypodense renal cortical lesions scattered in both kidneys are too small to characterize and require no follow-up. Numerous parapelvic renal cysts in both kidneys, left greater than right. No hydronephrosis. Normal bladder. Stomach/Bowel: Small hiatal hernia. Otherwise normal nondistended stomach. Normal caliber small bowel with no small bowel wall thickening. Normal appendix. Moderate sigmoid diverticulosis, with no large bowel wall thickening or acute pericolonic fat stranding. Vascular/Lymphatic: Atherosclerotic nonaneurysmal abdominal aorta. Patent portal, splenic, hepatic and renal veins. No pathologically enlarged lymph nodes in the abdomen or pelvis. Reproductive: Grossly normal uterus.  No adnexal mass. Other: No pneumoperitoneum, ascites or focal fluid collection. Musculoskeletal: No aggressive appearing focal osseous lesions. Mild thoracolumbar spondylosis. IMPRESSION: 1. No acute abnormality. No evidence of bowel obstruction or acute bowel inflammation. Normal appendix. Moderate sigmoid diverticulosis, with no evidence of acute diverticulitis. 2. Chronic pancreatitis. 3. Small hiatal hernia. 4. Thick  pleural-parenchymal scarring at the left lung base. 5.  Aortic Atherosclerosis (ICD10-I70.0). Electronically Signed   By: Ilona Sorrel M.D.   On: 11/03/2019 16:00   DG Chest Portable 1 View  Result Date: 11/03/2019 CLINICAL DATA:  History of COVID.  Altered mental status, lethargy. EXAM: PORTABLE CHEST 1 VIEW COMPARISON:  10/23/2019 and CT chest 08/28/2016. FINDINGS: Trachea is midline. Heart size stable. Thoracic aorta is calcified. Lungs are hyperinflated with linear opacification in the left lower lobe. Tiny left pleural effusion. IMPRESSION: 1. Left lower lobe atelectasis or scarring. 2. Tiny left pleural effusion. Electronically Signed   By: Lorin Picket M.D.   On: 11/03/2019  13:27    EKG: Independently reviewed.  Sinus tachycardia, left bundle branch block comparable to previous EKG.  Assessment/Plan Principal Problem:   Hyperglycemia Active Problems:   Diabetes ([place])   Hypertension associated with diabetes ([place])   Alzheimer's disease ([place])   GERD (gastroesophageal reflux disease)   Hyperlipidemia associated with type 2 diabetes mellitus ([place])     1.  Uncontrolled type 2 diabetes with hyperglycemia: Known A1c of 6.9.  Treated with dexamethasone resulting in hyperglycemia.  She has finished her steroid doses. IV fluid resuscitation followed by maintenance IV fluids. We will start patient on a small dose of Levemir along with sliding scale insulin.  Since she is off the steroids, depends upon her blood sugars and total insulin need, may be able to go home on oral hypoglycemics. She has good support system at home and they will be able to monitor blood sugars at home.  2.  Leukocytosis with left shift: With no evidence of bacterial infection.  Normal lactate.  Probably due to steroid use.  Blood cultures were drawn.  Will hold off on any antibiotics use.  Hydrate and recheck in the morning.  3.  Hypertension: Not on treatment at home.  Currently no indication to start treatment.   Only as needed medications to keep blood pressures less than 180.  4.  Alzheimer's disease with progressive debility: Lives at home with family.  She has progressive debility and exacerbated by recent viral infection. She will work with PT OT before discharge. We will check orthostatic.  5.  Recently treated COVID-19 pneumonia: Diagnosed 12/30, 2 weeks from diagnosis. According to hospital policy, she has moderate to severe disease and previous hospitalization for COVID-19 pneumonia, needs to isolate for total 3 weeks. Airborne precautions.  Admit to COVID unit. Currently no active pulmonary symptoms.  She has finished remdesivir and steroid therapy. Oxygen as needed, keep supplemental oxygen to keep saturations 89% or above. Bronchodilator therapy and chest physiotherapy.  6.  AKI: Recent normal renal functions.  Due to dehydration.  Hydrate and monitor levels.  DVT prophylaxis: Lovenox subcu Code Status: Full code.  Patient's daughter at bedside is healthcare power of attorney. Family Communication: Daughter at the bedside Disposition Plan: Home.  Anticipate tomorrow. Consults called: None Admission status: Inpatient, telemetry   Barb Merino MD [facility] Pager [phone]

## 2019-11-03 NOTE — ED Provider Notes (Signed)
[facility] DEPT Provider Note   CSN: [id number] Arrival date & time: 11/03/19  1140     History No chief complaint on file.   [name] is a 86 y.o. female. Level 5 caveat due to altered mental status. HPI Patient presents with mental status change.  Reportedly EMS called for patient being unconscious.  Hyperglycemic.  Reportedly has been off her Metformin.  Family reportedly stopped because it was causing her to be prediabetic.  Patient really cannot provide much history but will squeeze hands.  Had been admitted for Covid around 2 weeks ago.  CBG of close to 500.  Reported sats of 89% on room air.  .   Past Medical History:  Diagnosis Date   Alzheimer's dementia without behavioral disturbance ([place])    diagnosed 12/2015; Dr. Jade/neurology   Anemia    Asthma    Blood transfusion 05-24-12   Blood transfusion without reported diagnosis    Chronic kidney disease    dr Clover Mealy   Diabetes mellitus    Diverticulosis    DVT (deep venous thrombosis) ([place]) 03/2005   hx.left leg   Esophageal stricture    GERD (gastroesophageal reflux disease)    Glaucoma    Hiatal hernia 05/07/13   Hyperlipidemia    Hypertension    Osteoarthritis    Pancreatic duct dilated 05/07/13   Pulmonary nodules 05/07/13   PVD (peripheral vascular disease) ([place])    Tubular adenoma of colon     Patient Active Problem List   Diagnosis Date Noted   Aortic atherosclerosis ([place]) 10/30/2019   Hyperlipidemia associated with type 2 diabetes mellitus ([place]) 10/30/2019   Bradycardia with 51-60 beats per minute 10/24/2019   COVID-19 virus infection 10/23/2019   Protein-calorie malnutrition ([place]) 07/21/2018   Osteoporosis 07/02/2018   Diabetic ulcer of foot with fat layer exposed ([place]) 05/14/2018   GERD (gastroesophageal reflux disease) 05/02/2018   Generalized weakness 05/02/2018   History of DVT in adulthood 06/26/2017   LBBB (left bundle branch  block) 10/09/2016   Pleural effusion, left 09/23/2016   Coronary artery calcification seen on CAT scan 08/28/2016   Alzheimer's disease ([place]) 01/04/2016   Pancreas cyst 07/02/2015   Asthma, chronic 03/16/2015   Primary open angle glaucoma of left eye, moderate stage 12/25/2014   Pseudophakia of both eyes 02/15/2013   Anemia 05/25/2012   Astigmatism 04/29/2012   Allergic rhinitis 10/30/2010   Venous (peripheral) insufficiency 04/13/2007   Diabetes ([place]) 12/18/2006   Hypertension associated with diabetes ([place]) 12/18/2006   Atherosclerosis of native arteries of extremity with intermittent claudication ([place]) 12/18/2006   OSTEOARTHRITIS OF SPINE, NOS 12/18/2006    Past Surgical History:  Procedure Laterality Date   CATARACT EXTRACTION, BILATERAL     COLONOSCOPY  05/27/2012   Procedure: COLONOSCOPY;  Surgeon: Lafayette Dragon, MD;  Location: [facility] ENDOSCOPY;  Service: Endoscopy;  Laterality: N/A;   ESOPHAGOGASTRODUODENOSCOPY  05/26/2012   Procedure: ESOPHAGOGASTRODUODENOSCOPY (EGD);  Surgeon: Lafayette Dragon, MD;  Location: [facility];  Service: Endoscopy;  Laterality: N/A;   EUS N/A 07/08/2013   Procedure: UPPER ENDOSCOPIC ULTRASOUND (EUS) LINEAR;  Surgeon: Milus Banister, MD;  Location: [facility] ENDOSCOPY;  Service: Endoscopy;  Laterality: N/A;  need side view scope   EYE SURGERY     NM MYOVIEW LTD  10/2016   Read as intermediate risk due to EF of 38%.Anemia or infarction noted. Only abnormal septal WM.    TRANSTHORACIC ECHOCARDIOGRAM  10/2016    EF 45-50% with possible inferior hypokinesis. Aortic  sclerosis without stenosis (ordered to reassess EF from [place])   VASCULAR SURGERY  09/2004   WRIST FRACTURE SURGERY     R wrist fracture, plate     OB History   No obstetric history on file.     Family History  Adopted: Yes  Problem Relation Age of Onset   Migraines Daughter    Cancer Son    Colon cancer Neg Hx     Social History   Tobacco Use   Smoking  status: Never Smoker   Smokeless tobacco: Never Used  Substance Use Topics   Alcohol use: No   Drug use: No    Home Medications Prior to Admission medications   Medication Sig Start Date End Date Taking? Authorizing Provider  albuterol (PROAIR HFA) 108 (90 Base) MCG/ACT inhaler 2 PUFFS EVERY 6 HOURS AS NEEDED FOR SHORTNESS OF BREATH. Patient taking differently: Inhale 2 puffs into the lungs every 6 (six) hours as needed for wheezing or shortness of breath.  12/18/18  Yes Marin Olp, MD  albuterol (PROVENTIL) (2.5 MG/3ML) 0.083% nebulizer solution USE THREE MILLILITERS VIA NEBULIZATION BY MOUTH EVERY 6 HOURS AS NEEDED FOR WHEEZING OR SHORTNESS OF BREATH Patient taking differently: Take 2.5 mg by nebulization daily.  12/08/18  Yes Marin Olp, MD  ascorbic acid (VITAMIN C) 500 MG tablet Take 500 mg by mouth 2 (two) times daily.   Yes [provider]  aspirin 81 MG tablet Take 81 mg by mouth daily.   Yes [provider]  brimonidine (ALPHAGAN P) 0.1 % SOLN Place 1 drop into both eyes 2 (two) times daily.  04/24/16  Yes [provider]  budesonide (PULMICORT) 0.25 MG/2ML nebulizer solution Take 2 mLs (0.25 mg total) by nebulization daily. ICD10 Code-J45.909 Patient taking differently: Take 0.25 mg by nebulization at bedtime. ICD10 Code-J45.909 12/08/18  Yes Marin Olp, MD  calcium carbonate (OS-CAL) 600 MG TABS Take 600 mg by mouth 2 (two) times daily with a meal.   Yes [provider]  citalopram (CELEXA) 10 MG tablet TAKE 1 TABLET ONCE DAILY. Patient taking differently: Take 10 mg by mouth daily.  09/24/19  Yes Marin Olp, MD  donepezil (ARICEPT) 10 MG tablet TAKE ONE TABLET AT BEDTIME. Patient taking differently: Take 10 mg by mouth at bedtime.  06/01/19  Yes Marin Olp, MD  famotidine (PEPCID) 20 MG tablet TAKE ONE TABLET TWICE A DAY AS NEEDED FOR HEARTBURN OR INDIGESTION. Patient taking differently: Take 20 mg by mouth daily.   09/24/19  Yes Marin Olp, MD  ferrous sulfate 325 (65 FE) MG EC tablet Take 325 mg by mouth daily with breakfast.    Yes [provider]  memantine (NAMENDA) 10 MG tablet TAKE 1 TABLET TWICE DAILY FOR MEMORY. Patient taking differently: Take 10 mg by mouth 2 (two) times daily.  10/04/19  Yes Pieter Partridge, DO  Multiple Vitamin (MULTIVITAMIN WITH MINERALS) TABS tablet Take 1 tablet by mouth daily.   Yes [provider]    Allergies    Patient has no known allergies.  Review of Systems   Review of Systems  Unable to perform ROS: Mental status change    Physical Exam Updated Vital Signs BP (!) 194/86    Pulse 78    Temp 98.5 F (36.9 C) (Rectal)    Resp 14    LMP  (LMP Unknown)    SpO2 100%   Physical Exam Vitals and nursing note reviewed.  HENT:  Head: Normocephalic.  Eyes:     Pupils: Pupils are equal, round, and reactive to light.     Comments: Patient is sitting with her eyes closed.  Will open to command but otherwise health shot.  Cardiovascular:     Rate and Rhythm: Regular rhythm.  Pulmonary:     Breath sounds: No wheezing or rhonchi.  Abdominal:     Tenderness: There is no abdominal tenderness.  Musculoskeletal:        General: No tenderness.  Skin:    General: Skin is warm.  Neurological:     Mental Status: She is alert.     Comments: Sitting in bed with eyes closed.  Will not speak but will squeeze hands to command.     ED Results / Procedures / Treatments   Labs (all labs ordered are listed, but only abnormal results are displayed) Labs Reviewed  COMPREHENSIVE METABOLIC PANEL - Abnormal; Notable for the following components:      Result Value   Glucose, Bld 515 (*)    BUN 47 (*)    Creatinine, Ser 1.15 (*)    Total Bilirubin 1.5 (*)    GFR calc non Af Amer 43 (*)    GFR calc Af Amer 50 (*)    All other components within normal limits  URINALYSIS, ROUTINE W REFLEX MICROSCOPIC - Abnormal; Notable for the following components:     Glucose, UA >=500 (*)    Ketones, ur 20 (*)    Protein, ur 30 (*)    Leukocytes,Ua SMALL (*)    All other components within normal limits  CBC WITH DIFFERENTIAL/PLATELET - Abnormal; Notable for the following components:   WBC 24.0 (*)    Hemoglobin 15.9 (*)    HCT 47.9 (*)    Neutro Abs 21.9 (*)    Monocytes Absolute 1.1 (*)    Abs Immature Granulocytes 0.19 (*)    All other components within normal limits  CBG MONITORING, ED - Abnormal; Notable for the following components:   Glucose-Capillary 476 (*)    All other components within normal limits  CBG MONITORING, ED - Abnormal; Notable for the following components:   Glucose-Capillary 355 (*)    All other components within normal limits  CULTURE, BLOOD (ROUTINE X 2)  CULTURE, BLOOD (ROUTINE X 2)  RAPID URINE DRUG SCREEN, HOSP PERFORMED  ETHANOL  LACTIC ACID, PLASMA  LACTIC ACID, PLASMA  LIPASE, BLOOD    EKG EKG Interpretation  Date/Time:  Wednesday November 03 2019 12:03:26 EST Ventricular Rate:  100 PR Interval:    QRS Duration: 129 QT Interval:  387 QTC Calculation: 500 R Axis:   73 Text Interpretation: Sinus tachycardia with irregular rate IVCD, consider atypical LBBB Confirmed by Davonna Belling [phone]) on 11/03/2019 12:17:20 PM   Radiology CT Head Wo Contrast  Result Date: 11/03/2019 CLINICAL DATA:  EMS called by family saying patient was "unconscious" but per EMS - patient was just lethargic and responsive to painful stimuli Hx: dementia, speaks with confusionHad COVID (out of 14 day window per daughter) and UTI EXAM: CT HEAD WITHOUT CONTRAST TECHNIQUE: Contiguous axial images were obtained from the base of the skull through the vertex without intravenous contrast. COMPARISON:  10/20/2019 FINDINGS: Brain: No evidence of acute infarction, hemorrhage, hydrocephalus, extra-axial collection or mass lesion/mass effect. Ventricular and sulcal enlargement reflecting age related volume loss. Patchy hypoattenuation in the  white matter is noted consistent mild to moderate chronic microvascular ischemic change. Vascular: No hyperdense vessel or unexpected calcification. Skull: Normal.  Negative for fracture or focal lesion. Sinuses/Orbits: Globes and orbits are unremarkable. Visualized sinuses and mastoid air cells are clear. Other: None. IMPRESSION: 1. No acute intracranial abnormalities. 2. Age related volume loss. Mild to moderate chronic microvascular ischemic change. Electronically Signed   By: Lajean Manes M.D.   On: 11/03/2019 15:51   CT ABDOMEN PELVIS W CONTRAST  Result Date: 11/03/2019 CLINICAL DATA:  Lethargy.  Dementia.  Abdominal pain.  UTI. EXAM: CT ABDOMEN AND PELVIS WITH CONTRAST TECHNIQUE: Multidetector CT imaging of the abdomen and pelvis was performed using the standard protocol following bolus administration of intravenous contrast. CONTRAST:  176mL OMNIPAQUE IOHEXOL 300 MG/ML  SOLN COMPARISON:  06/18/2013 MRI abdomen. FINDINGS: Scan limited by motion degradation. Lower chest: Several (greater than 5) solid pulmonary nodules scattered at both lung bases, largest 4 mm in the anterior right lower lobe (series 6/image 5), unchanged since 09/20/2013 chest CT, considered benign. Thick curvilinear bandlike consolidation in the basilar left lower lobe with associated volume loss and distortion and with smooth left pleural thickening, compatible with pleural-parenchymal scarring. Coronary atherosclerosis. Hepatobiliary: Normal liver size. No liver mass. No definite gallbladder wall thickening. No radiopaque cholelithiasis. No biliary ductal dilatation. Pancreas: Diffuse pancreatic parenchymal atrophy. Irregular diffuse pancreatic duct dilation up to 10 mm diameter. Coarse calcification in the pancreatic head. These findings are unchanged since 2014 MRI and compatible with chronic pancreatitis. No discrete pancreatic mass. No peripancreatic fluid collections. Spleen: Normal size. No mass. Adrenals/Urinary Tract: Normal  adrenals. Simple 3.8 cm lower left renal cyst. Subcentimeter hypodense renal cortical lesions scattered in both kidneys are too small to characterize and require no follow-up. Numerous parapelvic renal cysts in both kidneys, left greater than right. No hydronephrosis. Normal bladder. Stomach/Bowel: Small hiatal hernia. Otherwise normal nondistended stomach. Normal caliber small bowel with no small bowel wall thickening. Normal appendix. Moderate sigmoid diverticulosis, with no large bowel wall thickening or acute pericolonic fat stranding. Vascular/Lymphatic: Atherosclerotic nonaneurysmal abdominal aorta. Patent portal, splenic, hepatic and renal veins. No pathologically enlarged lymph nodes in the abdomen or pelvis. Reproductive: Grossly normal uterus.  No adnexal mass. Other: No pneumoperitoneum, ascites or focal fluid collection. Musculoskeletal: No aggressive appearing focal osseous lesions. Mild thoracolumbar spondylosis. IMPRESSION: 1. No acute abnormality. No evidence of bowel obstruction or acute bowel inflammation. Normal appendix. Moderate sigmoid diverticulosis, with no evidence of acute diverticulitis. 2. Chronic pancreatitis. 3. Small hiatal hernia. 4. Thick pleural-parenchymal scarring at the left lung base. 5.  Aortic Atherosclerosis (ICD10-I70.0). Electronically Signed   By: Ilona Sorrel M.D.   On: 11/03/2019 16:00   DG Chest Portable 1 View  Result Date: 11/03/2019 CLINICAL DATA:  History of COVID.  Altered mental status, lethargy. EXAM: PORTABLE CHEST 1 VIEW COMPARISON:  10/23/2019 and CT chest 08/28/2016. FINDINGS: Trachea is midline. Heart size stable. Thoracic aorta is calcified. Lungs are hyperinflated with linear opacification in the left lower lobe. Tiny left pleural effusion. IMPRESSION: 1. Left lower lobe atelectasis or scarring. 2. Tiny left pleural effusion. Electronically Signed   By: Lorin Picket M.D.   On: 11/03/2019 13:27    Procedures Procedures (including critical care  time)  Medications Ordered in ED Medications  sodium chloride 0.9 % bolus 1,000 mL (0 mLs Intravenous Stopped 11/03/19 1511)  sodium chloride 0.9 % bolus 1,000 mL (0 mLs Intravenous Stopped 11/03/19 1412)  iohexol (OMNIPAQUE) 300 MG/ML solution 100 mL (100 mLs Intravenous Contrast Given 11/03/19 1510)    ED Course  I have reviewed the triage vital signs and the nursing notes.  Pertinent labs & imaging results that were available during my care of the patient were reviewed by me and considered in my medical decision making (see chart for details).    MDM Rules/Calculators/A&P                     Patient brought in for decreasing mental status.  Over the last few days has had decreased energy and decreased talking.  Sugars have been high.  Initially had been told that Metformin was stopped because it was making her prediabetic.  Patient's daughter is now here and states that it would then stop because her hemoglobin A1c is been doing well and her doctor did not think it was needed anymore.  Afebrile but does have elevated white count of 24.  Urine does not have a clear infection.  Had previous questionable UTI with admission but also had a repeat culture that was negative.  Head CT and abdominal CT overall reassuring.  Glucose elevated but not an anion gap.  Has come down some with fluids.  However still somewhat encephalopathic.  Had had previous positive Covid test and required admission for that the beginning of this month.  Will admit to hospitalist. Final Clinical Impression(s) / ED Diagnoses Final diagnoses:  Hyperglycemia  Leukocytosis, unspecified type  Encephalopathy    Rx / DC Orders ED Discharge Orders    None       Davonna Belling, MD 11/03/19 1615

## 2019-11-03 NOTE — ED Notes (Signed)
PT able to tolerate PO fluids.

## 2019-11-03 NOTE — ED Notes (Signed)
Pt has been placed on pure wick. Suction set to 45mmHg.  

## 2019-11-03 NOTE — Progress Notes (Signed)
Lovenox per Pharmacy for DVT Prophylaxis    Pharmacy has been consulted from dosing enoxaparin (lovenox) in this patient for DVT prophylaxis.  The pharmacist has reviewed pertinent labs (Hgb _15.9__; PLT_202__), patient weight (_51.7__kg) and renal function (CrCl_28.7__mL/min) and decided that enoxaparin 30__mg SQ Q24Hrs is appropriate for this patient.  The pharmacy department will sign off at this time.  Please reconsult pharmacy if status changes or for further issues.  Thank you  Cyndia Diver PharmD, BCPS  11/03/2019, 9:30 PM

## 2019-11-03 NOTE — ED Notes (Signed)
Patient 89% RA. Put patient on 2L [place] with no change. Bumped patient to 4L [place] and patient is 98% SpO2.

## 2019-11-03 NOTE — ED Notes (Signed)
Date and time results received: 11/03/19 1320  (use smartphrase ".now" to insert current time)  Test: Glucose Critical Value:  515  Name of Provider Notified: Alvino Chapel, MD  Orders Received? Or Actions Taken?: See Chart

## 2019-11-03 NOTE — Telephone Encounter (Signed)
Received following message from reception  Per daughter her mohter had an appt last week and that she has been going down hill since. Her blood sugar levels were super high (395) and today she isnt really responsive bs today is (345).   I have called and spoke to daughter [name] ( on [place]) Patient has not been communicating at all, she has had decline in physical movement in last few days. Not able to walk at all even with help or stand. She is not opening eyes. Can only get her to drink small sips of water. Not talking at all.   BP today was 181/105 and last CGB 540 (fasting) have reviewed with Dr. Yong Channel and advised patient to go to emergency room. Transportation is issue so they will cal 911.   Home health was scheduled to come to home today. Per request of family I have called and let them know that they will be going to ED. Home health will follow up after hospitalization

## 2019-11-03 NOTE — ED Notes (Signed)
Spoke with provider about changing the Pulmicort nebulizer order since she was COVID + on 12/30 to an inhaler.

## 2019-11-03 NOTE — Telephone Encounter (Signed)
Agree with ED disposition

## 2019-11-04 ENCOUNTER — Other Ambulatory Visit: Payer: Self-pay

## 2019-11-04 DIAGNOSIS — F0390 Unspecified dementia without behavioral disturbance: Secondary | ICD-10-CM

## 2019-11-04 DIAGNOSIS — R5381 Other malaise: Secondary | ICD-10-CM

## 2019-11-04 DIAGNOSIS — E1165 Type 2 diabetes mellitus with hyperglycemia: Secondary | ICD-10-CM | POA: Diagnosis present

## 2019-11-04 DIAGNOSIS — E876 Hypokalemia: Secondary | ICD-10-CM | POA: Diagnosis not present

## 2019-11-04 DIAGNOSIS — K219 Gastro-esophageal reflux disease without esophagitis: Secondary | ICD-10-CM | POA: Diagnosis present

## 2019-11-04 DIAGNOSIS — D72825 Bandemia: Secondary | ICD-10-CM | POA: Diagnosis present

## 2019-11-04 DIAGNOSIS — E870 Hyperosmolality and hypernatremia: Secondary | ICD-10-CM

## 2019-11-04 DIAGNOSIS — U071 COVID-19: Secondary | ICD-10-CM | POA: Diagnosis not present

## 2019-11-04 DIAGNOSIS — E1169 Type 2 diabetes mellitus with other specified complication: Secondary | ICD-10-CM | POA: Diagnosis present

## 2019-11-04 DIAGNOSIS — G9340 Encephalopathy, unspecified: Secondary | ICD-10-CM | POA: Diagnosis present

## 2019-11-04 DIAGNOSIS — F0280 Dementia in other diseases classified elsewhere without behavioral disturbance: Secondary | ICD-10-CM | POA: Diagnosis not present

## 2019-11-04 DIAGNOSIS — R531 Weakness: Secondary | ICD-10-CM | POA: Diagnosis not present

## 2019-11-04 DIAGNOSIS — E785 Hyperlipidemia, unspecified: Secondary | ICD-10-CM | POA: Diagnosis present

## 2019-11-04 DIAGNOSIS — R279 Unspecified lack of coordination: Secondary | ICD-10-CM | POA: Diagnosis not present

## 2019-11-04 DIAGNOSIS — H409 Unspecified glaucoma: Secondary | ICD-10-CM | POA: Diagnosis present

## 2019-11-04 DIAGNOSIS — R262 Difficulty in walking, not elsewhere classified: Secondary | ICD-10-CM | POA: Diagnosis not present

## 2019-11-04 DIAGNOSIS — L89312 Pressure ulcer of right buttock, stage 2: Secondary | ICD-10-CM | POA: Diagnosis not present

## 2019-11-04 DIAGNOSIS — E871 Hypo-osmolality and hyponatremia: Secondary | ICD-10-CM | POA: Diagnosis not present

## 2019-11-04 DIAGNOSIS — G9341 Metabolic encephalopathy: Secondary | ICD-10-CM | POA: Diagnosis not present

## 2019-11-04 DIAGNOSIS — I152 Hypertension secondary to endocrine disorders: Secondary | ICD-10-CM | POA: Diagnosis present

## 2019-11-04 DIAGNOSIS — M6282 Rhabdomyolysis: Secondary | ICD-10-CM | POA: Diagnosis not present

## 2019-11-04 DIAGNOSIS — R4182 Altered mental status, unspecified: Secondary | ICD-10-CM | POA: Diagnosis not present

## 2019-11-04 DIAGNOSIS — Z743 Need for continuous supervision: Secondary | ICD-10-CM | POA: Diagnosis not present

## 2019-11-04 DIAGNOSIS — N179 Acute kidney failure, unspecified: Secondary | ICD-10-CM

## 2019-11-04 DIAGNOSIS — G309 Alzheimer's disease, unspecified: Secondary | ICD-10-CM | POA: Diagnosis not present

## 2019-11-04 DIAGNOSIS — Z86718 Personal history of other venous thrombosis and embolism: Secondary | ICD-10-CM | POA: Diagnosis not present

## 2019-11-04 DIAGNOSIS — Z7189 Other specified counseling: Secondary | ICD-10-CM

## 2019-11-04 DIAGNOSIS — R739 Hyperglycemia, unspecified: Secondary | ICD-10-CM | POA: Diagnosis not present

## 2019-11-04 DIAGNOSIS — E860 Dehydration: Secondary | ICD-10-CM

## 2019-11-04 DIAGNOSIS — I10 Essential (primary) hypertension: Secondary | ICD-10-CM

## 2019-11-04 DIAGNOSIS — L89619 Pressure ulcer of right heel, unspecified stage: Secondary | ICD-10-CM | POA: Diagnosis present

## 2019-11-04 DIAGNOSIS — L89156 Pressure-induced deep tissue damage of sacral region: Secondary | ICD-10-CM | POA: Diagnosis present

## 2019-11-04 DIAGNOSIS — E1159 Type 2 diabetes mellitus with other circulatory complications: Secondary | ICD-10-CM

## 2019-11-04 DIAGNOSIS — I2510 Atherosclerotic heart disease of native coronary artery without angina pectoris: Secondary | ICD-10-CM | POA: Diagnosis present

## 2019-11-04 DIAGNOSIS — L8990 Pressure ulcer of unspecified site, unspecified stage: Secondary | ICD-10-CM | POA: Insufficient documentation

## 2019-11-04 LAB — BASIC METABOLIC PANEL
Anion gap: 5 (ref 5–15)
Anion gap: 6 (ref 5–15)
BUN: 31 mg/dL — ABNORMAL HIGH (ref 8–23)
BUN: 26 mg/dL — ABNORMAL HIGH (ref 8–23)
CO2: 26 mmol/L (ref 22–32)
CO2: 29 mmol/L (ref 22–32)
Calcium: 8.1 mg/dL — ABNORMAL LOW (ref 8.9–10.3)
Calcium: 8.9 mg/dL (ref 8.9–10.3)
Chloride: 116 mmol/L — ABNORMAL HIGH (ref 98–111)
Chloride: 105 mmol/L (ref 98–111)
Creatinine, Ser: 0.54 mg/dL (ref 0.44–1.00)
Creatinine, Ser: 0.81 mg/dL (ref 0.44–1.00)
GFR calc Af Amer: >60 mL/min (ref >60)
GFR calc Af Amer: >60 mL/min (ref >60)
GFR calc non Af Amer: >60 mL/min (ref >60)
GFR calc non Af Amer: >60 mL/min (ref >60)
Glucose, Bld: 142 mg/dL — ABNORMAL HIGH (ref 70–99)
Glucose, Bld: 174 mg/dL — ABNORMAL HIGH (ref 70–99)
Potassium: 3 mmol/L — ABNORMAL LOW (ref 3.5–5.1)
Potassium: 3.5 mmol/L (ref 3.5–5.1)
Sodium: 147 mmol/L — ABNORMAL HIGH (ref 135–145)
Sodium: 140 mmol/L (ref 135–145)

## 2019-11-04 LAB — CBG MONITORING, ED
Glucose-Capillary: 102 mg/dL — ABNORMAL HIGH (ref 70–99)
Glucose-Capillary: 216 mg/dL — ABNORMAL HIGH (ref 70–99)
Glucose-Capillary: 215 mg/dL — ABNORMAL HIGH (ref 70–99)

## 2019-11-04 LAB — CBC
HCT: 38.3 % (ref 36.0–46.0)
Hemoglobin: 12.7 g/dL (ref 12.0–15.0)
MCH: 33.7 pg (ref 26.0–34.0)
MCHC: 33.2 g/dL (ref 30.0–36.0)
MCV: 101.6 fL — ABNORMAL HIGH (ref 80.0–100.0)
Platelets: 152 10*3/uL (ref 150–400)
RBC: 3.77 MIL/uL — ABNORMAL LOW (ref 3.87–5.11)
RDW: 13.3 % (ref 11.5–15.5)
WBC: 21.9 10*3/uL — ABNORMAL HIGH (ref 4.0–10.5)
nRBC: 0 % (ref 0.0–0.2)

## 2019-11-04 LAB — MAGNESIUM: Magnesium: 2 mg/dL (ref 1.7–2.4)

## 2019-11-04 LAB — PHOSPHORUS: Phosphorus: 1.3 mg/dL — ABNORMAL LOW (ref 2.5–4.6)

## 2019-11-04 LAB — GLUCOSE, CAPILLARY: Glucose-Capillary: 116 mg/dL — ABNORMAL HIGH (ref 70–99)

## 2019-11-04 MED ORDER — HYDRALAZINE HCL 25 MG PO TABS: 25.0000 mg | ORAL_TABLET | Freq: Four times a day (QID) | ORAL | Status: DC | PRN

## 2019-11-04 MED ORDER — ZINC SULFATE 220 (50 ZN) MG PO CAPS
220.0000 mg | ORAL_CAPSULE | Freq: Every day | ORAL | Status: DC
  Administered 2019-11-05 – 2019-11-07 (×3): 220 mg via ORAL
  Filled 2019-11-04 (×3): qty 1

## 2019-11-04 MED ORDER — INSULIN DETEMIR 100 UNIT/ML ~~LOC~~ SOLN
5.0000 [IU] | Freq: Two times a day (BID) | SUBCUTANEOUS | Status: DC
  Filled 2019-11-04 (×4): qty 0.05

## 2019-11-04 MED ORDER — AMLODIPINE BESYLATE 5 MG PO TABS
5.0000 mg | ORAL_TABLET | Freq: Every day | ORAL | Status: DC
  Filled 2019-11-04: qty 1

## 2019-11-04 MED ORDER — POTASSIUM CHLORIDE IN NACL 20-0.45 MEQ/L-% IV SOLN
INTRAVENOUS | Status: DC
  Filled 2019-11-04 (×2): qty 1000

## 2019-11-04 MED ORDER — GUAIFENESIN ER 600 MG PO TB12
600.0000 mg | ORAL_TABLET | Freq: Two times a day (BID) | ORAL | Status: DC
  Administered 2019-11-04: 600 mg via ORAL
  Filled 2019-11-04 (×3): qty 1

## 2019-11-04 MED ORDER — POTASSIUM PHOSPHATES 15 MMOLE/5ML IV SOLN
30.0000 mmol | Freq: Once | INTRAVENOUS | Status: AC
  Administered 2019-11-04: 30 mmol via INTRAVENOUS
  Filled 2019-11-04: qty 10

## 2019-11-04 MED ORDER — LINAGLIPTIN 5 MG PO TABS
5.0000 mg | ORAL_TABLET | Freq: Every day | ORAL | Status: DC
  Administered 2019-11-05 – 2019-11-07 (×3): 5 mg via ORAL
  Filled 2019-11-04 (×3): qty 1

## 2019-11-04 NOTE — Progress Notes (Signed)
PROGRESS NOTE  [name] [id number] DOB: 10-Sep-1931   PCP: Marin Olp, MD  Patient is from: Home.  Ambulates with walker and assistance at baseline.  Dependent for most ADLs.  DOA: 11/03/2019 LOS: 0  Brief Narrative / Interim history: 86 year old female with history of advanced dementia, asthma, CAD, DM-2, HTN and recent UTI and COVID-19 infection brought to ED by EMS due to progressive altered mental status, lethargy, ambulatory dysfunction and hyperglycemia greater than 500.   In ED, hypertensive.  100% on 2 L by [place].  WBC 24.  Glucose 515.  AG 14.  Bicarb within normal.  CT head, CXR and CT abdomen and pelvis without acute finding.  Received IV fluid and hospital service was called for admission for hypoglycemia, altered mental status and ambulatory dysfunction.  Subjective: No major events overnight of this morning.  Daughter at bedside.  Patient is awake but not engaging or verbalizing.  Follows some command.  Does not appear to be in distress.  Objective: Vitals:   11/04/19 1100 11/04/19 1130 11/04/19 1200 11/04/19 1230  BP: (!) 145/66 (!) 163/70 (!) 150/71 (!) 159/66  Pulse: (!) 59 (!) 57 (!) 52 (!) 56  Resp: 16 20 17 16   Temp:      TempSrc:      SpO2: 94% 95% 94% 95%   No intake or output data in the 24 hours ending 11/04/19 1323 There were no vitals filed for this visit.  Examination:  GENERAL: Lying comfortably.  No apparent distress. HEENT: MMM.  Vision and hearing grossly intact.  NECK: Supple.  No apparent JVD.  RESP: 94 to 96% on RA during my exam.  No IWOB.  Fair aeration bilaterally. CVS:  RRR. Heart sounds normal.  ABD/GI/GU: Bowel sounds present. Soft. Non tender.  MSK/EXT:  Moves extremities. No apparent deformity. No edema.  SKIN: no apparent skin lesion or wound NEURO: Awake, alert.  No apparent focal neuro deficit.  Not responding to orientation questions.  Does not follow command. PSYCH: Calm.  No apparent distress.  No  agitation.  Procedures:  None  Assessment & Plan: Uncontrolled DM-2 with hyperglycemia in the setting of steroid for COVID-19 infection: A1c 6.9% Recent Labs    11/03/19 2247 11/04/19 0739 11/04/19 1304  GLUCAP 222* 102* 216*  -Increase Levemir to 5 units twice daily -SSI-thin -Add Tradjenta. -Continue IV fluid.  Acute COVID-19 infection: No respiratory symptoms lethargy, encephalopathy and deconditioning.  On room air.  CXR without acute finding.  Completed course of remdesivir and Decadron outpatient. -Supportive care with inhalers, mucolytic's, antitussive, vitamins and incentive spirometry.  Acute metabolic encephalopathy in patient with history of Alzheimer's dementia: Likely due to COVID-19 infection and dehydration.  UDS negative.  No obvious source of infection.  CT head without acute finding.  No apparent focal neuro deficit. -Treat treatable causes. -Supportive care, precordial ventilation and delirium precautions. -Continue home Aricept, Namenda and Celexa -Further work-up pending clinical progress.  Debility/generalized weakness/ambulatory dysfunction: Worse in the setting of recent COVID-19 infection.  Ambulates with walker and assistance at baseline.  Dependent for most ADL's. -PT/OT eval  AKI: Likely prerenal in the setting of dehydration.  Resolved. -Change IV fluid to 1/2NS-KCl -Monitor  Hypernatremia: NA 147. -IV fluid as above  Hypokalemia/hypophosphatemia: Magnesium within normal. -Potassium phosphate 30 mmol -Recheck in the morning.  Leukocytosis/bandemia: Likely demargination due to steroid.  No obvious source of infection so far.  CXR and CT abdomen and pelvis without acute finding.  Blood cultures negative.  Improving. -Continue  monitoring.  Essential hypertension: BP slightly elevated.  Not on medication at home. -Add amlodipine 5 mg daily -Hydralazine 25 mg 3 times daily as needed  Goal of care discussion: Patient is full code.  Has advanced  dementia and comorbidities as above.  Low palliative performance score.  Appropriate for hospice.  We discussed about pros and cons of her CODE STATUS with patient's daughter at bedside.  I recommended DNR/DNI as resuscitation could pose more harm than benefits.  However, patient's daughter chose to maintain full CODE STATUS until further discussion with patient's husband.                DVT prophylaxis: Subcu Lovenox Code Status: Full code-confirmed with patient's daughter at bedside Family Communication: Updated patient's daughter at bedside Disposition Plan: Remains inpatient due to acute encephalopathy and significant electrolyte derangement Consultants: None   Microbiology summarized: 10/20/2019-COVID-19 positive. 11/02/2018-blood cultures negative.  Sch Meds:  Scheduled Meds: . ascorbic acid  500 mg Oral BID  . aspirin EC  81 mg Oral Daily  . brimonidine  1 drop Both Eyes BID  . budesonide  2 puff Inhalation Daily  . calcium carbonate  1,250 mg Oral BID WC  . citalopram  10 mg Oral Daily  . donepezil  10 mg Oral QHS  . enoxaparin (LOVENOX) injection  30 mg Subcutaneous QHS  . famotidine  20 mg Oral Daily  . ferrous sulfate  325 mg Oral Q breakfast  . insulin aspart  0-5 Units Subcutaneous QHS  . insulin aspart  0-9 Units Subcutaneous TID WC  . insulin detemir  5 Units Subcutaneous QHS  . memantine  10 mg Oral BID  . multivitamin with minerals  1 tablet Oral Daily   Continuous Infusions: . 0.45 % NaCl with KCl 20 mEq / L Stopped (11/04/19 0944)  . potassium PHOSPHATE IVPB (in mmol) 30 mmol (11/04/19 0944)   PRN Meds:.acetaminophen **OR** acetaminophen, albuterol, ondansetron **OR** ondansetron (ZOFRAN) IV  Antimicrobials: Anti-infectives (From admission, onward)   None       I have personally reviewed the following labs and images: CBC: Recent Labs  Lab 11/03/19 1207 11/04/19 0452  WBC 24.0* 21.9*  NEUTROABS 21.9*  --   HGB 15.9* 12.7  HCT 47.9* 38.3   MCV 99.6 101.6*  PLT 202 152   BMP &GFR Recent Labs  Lab 11/03/19 1207 11/04/19 0452  NA 145 147*  K 3.8 3.0*  CL 105 116*  CO2 26 26  GLUCOSE 515* 142*  BUN 47* 31*  CREATININE 1.15* 0.54  CALCIUM 9.9 8.1*  MG  --  2.0  PHOS  --  1.3*   Estimated Creatinine Clearance: 41.2 mL/min (by C-G formula based on SCr of 0.54 mg/dL). Liver & Pancreas: Recent Labs  Lab 11/03/19 1207  AST 21  ALT 20  ALKPHOS 66  BILITOT 1.5*  PROT 7.0  ALBUMIN 3.7   Recent Labs  Lab 11/03/19 1713  LIPASE 17   No results for input(s): AMMONIA in the last 168 hours. Diabetic: No results for input(s): HGBA1C in the last 72 hours. Recent Labs  Lab 11/03/19 1158 11/03/19 1542 11/03/19 2247 11/04/19 0739 11/04/19 1304  GLUCAP 476* 355* 222* 102* 216*   Cardiac Enzymes: No results for input(s): CKTOTAL, CKMB, CKMBINDEX, TROPONINI in the last 168 hours. No results for input(s): PROBNP in the last 8760 hours. Coagulation Profile: No results for input(s): INR, PROTIME in the last 168 hours. Thyroid Function Tests: No results for input(s): TSH, T4TOTAL, FREET4, T3FREE,  THYROIDAB in the last 72 hours. Lipid Profile: No results for input(s): CHOL, HDL, LDLCALC, TRIG, CHOLHDL, LDLDIRECT in the last 72 hours. Anemia Panel: No results for input(s): VITAMINB12, FOLATE, FERRITIN, TIBC, IRON, RETICCTPCT in the last 72 hours. Urine analysis:    Component Value Date/Time   COLORURINE YELLOW 11/03/2019 [place] 11/03/2019 1207   LABSPEC 1.028 11/03/2019 1207   PHURINE 5.0 11/03/2019 1207   GLUCOSEU >=500 (A) 11/03/2019 1207   HGBUR NEGATIVE 11/03/2019 [place] 11/03/2019 1207   BILIRUBINUR Negative 02/12/2019 1019   KETONESUR 20 (A) 11/03/2019 1207   PROTEINUR 30 (A) 11/03/2019 1207   UROBILINOGEN 1.0 02/12/2019 1019   UROBILINOGEN 0.2 05/26/2012 1113   NITRITE NEGATIVE 11/03/2019 1207   LEUKOCYTESUR SMALL (A) 11/03/2019 1207   Sepsis Labs: Invalid  input(s): PROCALCITONIN, [place]  Microbiology: Recent Results (from the past 240 hour(s))  Culture, blood (routine x 2)     Status: None (Preliminary result)   Collection Time: 11/03/19  1:58 PM   Specimen: BLOOD  Result Value Ref Range Status   Specimen Description   Final    BLOOD LEFT WRIST Performed at [facility] [address] [place], [place] [address]    Special Requests   Final    BOTTLES DRAWN AEROBIC AND ANAEROBIC Blood Culture adequate volume Performed at [facility] [address], [place], [place] [address]    Culture   Final    NO GROWTH < 24 HOURS Performed at [facility] [address], [place], [place] [address]    Report Status PENDING  Incomplete  Culture, blood (routine x 2)     Status: None (Preliminary result)   Collection Time: 11/03/19  1:59 PM   Specimen: BLOOD  Result Value Ref Range Status   Specimen Description   Final    BLOOD LEFT ANTECUBITAL Performed at [facility] [address] [place], [place] [address]    Special Requests   Final    BOTTLES DRAWN AEROBIC AND ANAEROBIC Blood Culture results may not be optimal due to an inadequate volume of blood received in culture bottles Performed at [facility] [address], [place], [place] [address]    Culture   Final    NO GROWTH < 24 HOURS Performed at [facility] [address], [place], [place] [address]    Report Status PENDING  Incomplete    Radiology Studies: CT Head Wo Contrast  Result Date: 11/03/2019 CLINICAL DATA:  EMS called by family saying patient was "unconscious" but per EMS - patient was just lethargic and responsive to painful stimuli Hx: dementia, speaks with confusionHad COVID (out of 14 day window per daughter) and UTI EXAM: CT HEAD WITHOUT CONTRAST TECHNIQUE: Contiguous axial images were obtained from the base of the skull through the vertex without intravenous contrast.  COMPARISON:  10/20/2019 FINDINGS: Brain: No evidence of acute infarction, hemorrhage, hydrocephalus, extra-axial collection or mass lesion/mass effect. Ventricular and sulcal enlargement reflecting age related volume loss. Patchy hypoattenuation in the white matter is noted consistent mild to moderate chronic microvascular ischemic change. Vascular: No hyperdense vessel or unexpected calcification. Skull: Normal. Negative for fracture or focal lesion. Sinuses/Orbits: Globes and orbits are unremarkable. Visualized sinuses and mastoid air cells are clear. Other: None. IMPRESSION: 1. No acute intracranial abnormalities. 2. Age related volume loss. Mild to moderate chronic microvascular ischemic change. Electronically Signed   By: Dedra Skeens.D.  On: 11/03/2019 15:51   CT ABDOMEN PELVIS W CONTRAST  Result Date: 11/03/2019 CLINICAL DATA:  Lethargy.  Dementia.  Abdominal pain.  UTI. EXAM: CT ABDOMEN AND PELVIS WITH CONTRAST TECHNIQUE: Multidetector CT imaging of the abdomen and pelvis was performed using the standard protocol following bolus administration of intravenous contrast. CONTRAST:  122mL OMNIPAQUE IOHEXOL 300 MG/ML  SOLN COMPARISON:  06/18/2013 MRI abdomen. FINDINGS: Scan limited by motion degradation. Lower chest: Several (greater than 5) solid pulmonary nodules scattered at both lung bases, largest 4 mm in the anterior right lower lobe (series 6/image 5), unchanged since 09/20/2013 chest CT, considered benign. Thick curvilinear bandlike consolidation in the basilar left lower lobe with associated volume loss and distortion and with smooth left pleural thickening, compatible with pleural-parenchymal scarring. Coronary atherosclerosis. Hepatobiliary: Normal liver size. No liver mass. No definite gallbladder wall thickening. No radiopaque cholelithiasis. No biliary ductal dilatation. Pancreas: Diffuse pancreatic parenchymal atrophy. Irregular diffuse pancreatic duct dilation up to 10 mm diameter. Coarse  calcification in the pancreatic head. These findings are unchanged since 2014 MRI and compatible with chronic pancreatitis. No discrete pancreatic mass. No peripancreatic fluid collections. Spleen: Normal size. No mass. Adrenals/Urinary Tract: Normal adrenals. Simple 3.8 cm lower left renal cyst. Subcentimeter hypodense renal cortical lesions scattered in both kidneys are too small to characterize and require no follow-up. Numerous parapelvic renal cysts in both kidneys, left greater than right. No hydronephrosis. Normal bladder. Stomach/Bowel: Small hiatal hernia. Otherwise normal nondistended stomach. Normal caliber small bowel with no small bowel wall thickening. Normal appendix. Moderate sigmoid diverticulosis, with no large bowel wall thickening or acute pericolonic fat stranding. Vascular/Lymphatic: Atherosclerotic nonaneurysmal abdominal aorta. Patent portal, splenic, hepatic and renal veins. No pathologically enlarged lymph nodes in the abdomen or pelvis. Reproductive: Grossly normal uterus.  No adnexal mass. Other: No pneumoperitoneum, ascites or focal fluid collection. Musculoskeletal: No aggressive appearing focal osseous lesions. Mild thoracolumbar spondylosis. IMPRESSION: 1. No acute abnormality. No evidence of bowel obstruction or acute bowel inflammation. Normal appendix. Moderate sigmoid diverticulosis, with no evidence of acute diverticulitis. 2. Chronic pancreatitis. 3. Small hiatal hernia. 4. Thick pleural-parenchymal scarring at the left lung base. 5.  Aortic Atherosclerosis (ICD10-I70.0). Electronically Signed   By: Ilona Sorrel M.D.   On: 11/03/2019 16:00   DG Chest Portable 1 View  Result Date: 11/03/2019 CLINICAL DATA:  History of COVID.  Altered mental status, lethargy. EXAM: PORTABLE CHEST 1 VIEW COMPARISON:  10/23/2019 and CT chest 08/28/2016. FINDINGS: Trachea is midline. Heart size stable. Thoracic aorta is calcified. Lungs are hyperinflated with linear opacification in the left  lower lobe. Tiny left pleural effusion. IMPRESSION: 1. Left lower lobe atelectasis or scarring. 2. Tiny left pleural effusion. Electronically Signed   By: Lorin Picket M.D.   On: 11/03/2019 13:27    40 minutes with more than 50% spent in reviewing records, counseling patient/family and coordinating care.   Tonnette Zwiebel T. [place]  If 7PM-7AM, please contact night-coverage [email or web address] Password [email or web address] 11/04/2019, 1:23 PM

## 2019-11-04 NOTE — ED Notes (Signed)
Spoke with pharmacy about not receiving Namenda and Alphagan eye drops. Pharmacy is going to send down.

## 2019-11-04 NOTE — Progress Notes (Signed)
Spoke with daughter Lanell Matar 0000000 updated on patients condition, all questions answered, request call from attending physician in the am.

## 2019-11-04 NOTE — ED Notes (Signed)
Provided patients daughter a recliner, pillow, and warm blankets. Patient opened her eyes and spoke a few words.

## 2019-11-05 DIAGNOSIS — R531 Weakness: Secondary | ICD-10-CM

## 2019-11-05 DIAGNOSIS — G309 Alzheimer's disease, unspecified: Secondary | ICD-10-CM

## 2019-11-05 DIAGNOSIS — F0280 Dementia in other diseases classified elsewhere without behavioral disturbance: Secondary | ICD-10-CM

## 2019-11-05 DIAGNOSIS — L89312 Pressure ulcer of right buttock, stage 2: Secondary | ICD-10-CM

## 2019-11-05 LAB — GLUCOSE, CAPILLARY
Glucose-Capillary: 65 mg/dL — ABNORMAL LOW (ref 70–99)
Glucose-Capillary: 97 mg/dL (ref 70–99)
Glucose-Capillary: 162 mg/dL — ABNORMAL HIGH (ref 70–99)
Glucose-Capillary: 143 mg/dL — ABNORMAL HIGH (ref 70–99)
Glucose-Capillary: 255 mg/dL — ABNORMAL HIGH (ref 70–99)

## 2019-11-05 LAB — CBC
HCT: 39.5 % (ref 36.0–46.0)
Hemoglobin: 13.5 g/dL (ref 12.0–15.0)
MCH: 33.9 pg (ref 26.0–34.0)
MCHC: 34.2 g/dL (ref 30.0–36.0)
MCV: 99.2 fL (ref 80.0–100.0)
Platelets: 137 10*3/uL — ABNORMAL LOW (ref 150–400)
RBC: 3.98 MIL/uL (ref 3.87–5.11)
RDW: 12.8 % (ref 11.5–15.5)
WBC: 22.6 10*3/uL — ABNORMAL HIGH (ref 4.0–10.5)
nRBC: 0 % (ref 0.0–0.2)

## 2019-11-05 LAB — BASIC METABOLIC PANEL
Anion gap: 8 (ref 5–15)
BUN: 22 mg/dL (ref 8–23)
CO2: 27 mmol/L (ref 22–32)
Calcium: 8.6 mg/dL — ABNORMAL LOW (ref 8.9–10.3)
Chloride: 104 mmol/L (ref 98–111)
Creatinine, Ser: 0.59 mg/dL (ref 0.44–1.00)
GFR calc Af Amer: >60 mL/min (ref >60)
GFR calc non Af Amer: >60 mL/min (ref >60)
Glucose, Bld: 137 mg/dL — ABNORMAL HIGH (ref 70–99)
Potassium: 3.6 mmol/L (ref 3.5–5.1)
Sodium: 139 mmol/L (ref 135–145)

## 2019-11-05 LAB — MAGNESIUM: Magnesium: 1.9 mg/dL (ref 1.7–2.4)

## 2019-11-05 LAB — PHOSPHORUS: Phosphorus: 2 mg/dL — ABNORMAL LOW (ref 2.5–4.6)

## 2019-11-05 MED ORDER — AMLODIPINE BESYLATE 10 MG PO TABS
10.0000 mg | ORAL_TABLET | Freq: Every day | ORAL | Status: DC
  Administered 2019-11-05 – 2019-11-07 (×3): 10 mg via ORAL
  Filled 2019-11-05 (×3): qty 1

## 2019-11-05 MED ORDER — POTASSIUM PHOSPHATES 15 MMOLE/5ML IV SOLN
10.0000 mmol | Freq: Once | INTRAVENOUS | Status: AC
  Administered 2019-11-05: 10 mmol via INTRAVENOUS
  Filled 2019-11-05: qty 3.33

## 2019-11-05 MED ORDER — ENOXAPARIN SODIUM 40 MG/0.4ML ~~LOC~~ SOLN
40.0000 mg | Freq: Every day | SUBCUTANEOUS | Status: DC
  Administered 2019-11-05 – 2019-11-06 (×2): 40 mg via SUBCUTANEOUS
  Filled 2019-11-05 (×2): qty 0.4

## 2019-11-05 NOTE — Progress Notes (Signed)
PROGRESS NOTE  [name] [id number] DOB: 03-03-1931   PCP: Marin Olp, MD  Patient is from: Home.  Ambulates with walker and assistance at baseline.  Dependent for most ADLs.  DOA: 11/03/2019 LOS: 1  Brief Narrative / Interim history: 86 year old female with history of advanced dementia, asthma, CAD, DM-2, HTN and recent UTI and COVID-19 infection brought to ED by EMS due to progressive altered mental status, lethargy, ambulatory dysfunction and hyperglycemia greater than 500.   In ED, hypertensive.  100% on 2 L by .  WBC 24.  Glucose 515.  AG 14.  Bicarb within normal.  CT head, CXR and CT abdomen and pelvis without acute finding.  Received IV fluid and hospital service was called for admission for hypoglycemia, altered mental status and ambulatory dysfunction.  She was started on IV fluids and electrolyte replenishment with improvement in her mental status and hypoglycemia. Still with significant ambulatory dysfunction. Evaluated by PT/OT who recommended SNF.  Subjective: No major events overnight of this morning. Sitting in bed eating breakfast. She says she likes to go home. She is oriented to self, place and family's name. Denies chest pain, dyspnea, GI or UTI symptoms.  Objective: Vitals:   11/04/19 2019 11/05/19 0050 11/05/19 0533 11/05/19 1330  BP: (!) 178/77 (!) 142/63 (!) 154/68 127/63  Pulse: (!) 59 (!) 57 (!) 55 75  Resp: 18  20 19   Temp: 98.4 F (36.9 C)  98 F (36.7 C) 100.2 F (37.9 C)  TempSrc:   Oral Axillary  SpO2: 98%  98% 97%  Weight:      Height:        Intake/Output Summary (Last 24 hours) at 11/05/2019 1526 Last data filed at 11/05/2019 1104 Gross per 24 hour  Intake 863 ml  Output 300 ml  Net 563 ml   Filed Weights   11/04/19 1945  Weight: 58.9 kg    Examination:  GENERAL: No apparent distress. Nontoxic. Sitting in bed eating breakfast. HEENT: MMM.  Vision and hearing grossly intact.  NECK: Supple.  No apparent JVD.  RESP: On  RA. No IWOB. Fair aeration bilaterally. CVS:  RRR. Heart sounds normal.  ABD/GI/GU: Bowel sounds present. Soft. Non tender.  MSK/EXT:  Moves extremities. No apparent deformity. No edema.  SKIN: Pressure injury of skin as below. NEURO: Awake, alert and oriented to self, place and family name. No apparent focal neuro deficit. Moves all extremities. PSYCH: Calm. Normal affect.  Procedures:  None  Assessment & Plan: Uncontrolled DM-2 with hyperglycemia in the setting of steroid for COVID-19 infection: A1c 6.9% Recent Labs    11/05/19 0748 11/05/19 0816 11/05/19 1102  GLUCAP 65* 97 162*  -discontinue Levemir -continue SSI-thin and Tradjenta. -Discontinue IV fluid.  Acute COVID-19 infection: Presented with lethargy and deconditioning. On room air.  CXR without acute finding.  Completed course of remdesivir and Decadron outpatient. -Supportive care with inhalers, mucolytic's, antitussive, vitamins and incentive spirometry. -PT/OT  Acute metabolic encephalopathy in patient with history of Alzheimer's dementia: Likely due to COVID-19 infection and dehydration.  UDS negative.  No obvious source of infection.  CT head without acute finding.  No apparent focal neuro deficit. Improved. She is oriented to self, place and family name -Treat treatable causes. -Supportive care, precordial ventilation and delirium precautions. -Continue home Aricept, Namenda and Celexa  Debility/generalized weakness/ambulatory dysfunction: Worse in the setting of recent COVID-19 infection.  Ambulates with walker and assistance at baseline.  Dependent for most ADL's. -PT/OT  AKI: Likely prerenal in the  setting of dehydration.  Resolved. -Discontinue IV fluid.  Hypernatremia: Resolved. -Discontinue IV fluid  Hypokalemia/hypophosphatemia: Magnesium within normal. -Potassium phosphate 10 mmol -Recheck in the morning.  Leukocytosis/bandemia: Likely demargination due to steroid.  No obvious source of infection  so far.  CXR and CT abdomen and pelvis without acute finding.  Blood cultures negative.  Improving. -Continue monitoring.  Essential hypertension: BP slightly elevated.  Not on medication at home. -Increase amlodipine to 10 mg -Hydralazine 25 mg 3 times daily as needed  Debility/generalized weakness/physical deconditioning: Multifactorial-COVID-19 infection, underlying dementia, dehydration and electrolyte derangement -PT/OT.  Goal of care discussion: Patient is full code.  Has advanced dementia and comorbidities as above.  Low palliative performance score.  Appropriate for hospice.  We discussed about pros and cons of her CODE STATUS with patient's daughter at bedside.  I recommended DNR/DNI as resuscitation could pose more harm than benefits.  However, patient's daughter chose to maintain full CODE STATUS until further discussion with patient's husband.    Pressure Injury 11/04/19 Buttocks Right Stage 2 -  Partial thickness loss of dermis presenting as a shallow open injury with a red, pink wound bed without slough. non-blanchable, small open area (Active)  11/04/19 1830  Location: Buttocks  Location Orientation: Right  Staging: Stage 2 -  Partial thickness loss of dermis presenting as a shallow open injury with a red, pink wound bed without slough.  Wound Description (Comments): non-blanchable, small open area  Present on Admission: Yes     Pressure Injury 11/04/19 Heel Left;Right (Active)  11/04/19 1830  Location: Heel  Location Orientation: Left;Right  Staging:   Wound Description (Comments):   Present on Admission:      Pressure Injury 11/04/19 Sacrum Medial Deep Tissue Pressure Injury - Purple or maroon localized area of discolored intact skin or blood-filled blister due to damage of underlying soft tissue from pressure and/or shear. purple, no blister (Active)  11/04/19 1800  Location: Sacrum  Location Orientation: Medial  Staging: Deep Tissue Pressure Injury - Purple or  maroon localized area of discolored intact skin or blood-filled blister due to damage of underlying soft tissue from pressure and/or shear.  Wound Description (Comments): purple, no blister  Present on Admission: Yes               DVT prophylaxis: Subcu Lovenox Code Status: Full code-confirmed with patient's daughter at bedside 1/14. Family Communication: Updated patient's daughter over the phone. Disposition Plan: Remains inpatient due to significant weakness and electrolyte derangement. PT/OT recommended SNF. Family undecided yet. Anticipate discharge 1/16 unless family wants SNF. Consultants: None   Microbiology summarized: 10/20/2019-COVID-19 positive. 11/02/2018-blood cultures negative.  Sch Meds:  Scheduled Meds: . amLODipine  10 mg Oral Daily  . ascorbic acid  500 mg Oral BID  . aspirin EC  81 mg Oral Daily  . brimonidine  1 drop Both Eyes BID  . budesonide  2 puff Inhalation Daily  . calcium carbonate  1,250 mg Oral BID WC  . citalopram  10 mg Oral Daily  . donepezil  10 mg Oral QHS  . enoxaparin (LOVENOX) injection  40 mg Subcutaneous q1800  . famotidine  20 mg Oral Daily  . ferrous sulfate  325 mg Oral Q breakfast  . guaiFENesin  600 mg Oral BID  . insulin aspart  0-5 Units Subcutaneous QHS  . insulin aspart  0-9 Units Subcutaneous TID WC  . insulin detemir  5 Units Subcutaneous BID  . linagliptin  5 mg Oral Daily  . memantine  10 mg Oral BID  . multivitamin with minerals  1 tablet Oral Daily  . zinc sulfate  220 mg Oral Daily   Continuous Infusions: . potassium PHOSPHATE IVPB (in mmol) 10 mmol (11/05/19 1104)   PRN Meds:.acetaminophen **OR** acetaminophen, albuterol, hydrALAZINE, ondansetron **OR** ondansetron (ZOFRAN) IV  Antimicrobials: Anti-infectives (From admission, onward)   None       I have personally reviewed the following labs and images: CBC: Recent Labs  Lab 11/03/19 1207 11/04/19 0452 11/05/19 0232  WBC 24.0* 21.9* 22.6*    NEUTROABS 21.9*  --   --   HGB 15.9* 12.7 13.5  HCT 47.9* 38.3 39.5  MCV 99.6 101.6* 99.2  PLT 202 152 137*   BMP &GFR Recent Labs  Lab 11/03/19 1207 11/04/19 0452 11/04/19 1908 11/05/19 0232  NA 145 147* 140 139  K 3.8 3.0* 3.5 3.6  CL 105 116* 105 104  CO2 26 26 29 27   GLUCOSE 515* 142* 174* 137*  BUN 47* 31* 26* 22  CREATININE 1.15* 0.54 0.81 0.59  CALCIUM 9.9 8.1* 8.9 8.6*  MG  --  2.0  --  1.9  PHOS  --  1.3*  --  2.0*   Estimated Creatinine Clearance: 43.6 mL/min (by C-G formula based on SCr of 0.59 mg/dL). Liver & Pancreas: Recent Labs  Lab 11/03/19 1207  AST 21  ALT 20  ALKPHOS 66  BILITOT 1.5*  PROT 7.0  ALBUMIN 3.7   Recent Labs  Lab 11/03/19 1713  LIPASE 17   No results for input(s): AMMONIA in the last 168 hours. Diabetic: No results for input(s): HGBA1C in the last 72 hours. Recent Labs  Lab 11/04/19 1702 11/04/19 2016 11/05/19 0748 11/05/19 0816 11/05/19 1102  GLUCAP 215* 116* 65* 97 162*   Cardiac Enzymes: No results for input(s): CKTOTAL, CKMB, CKMBINDEX, TROPONINI in the last 168 hours. No results for input(s): PROBNP in the last 8760 hours. Coagulation Profile: No results for input(s): INR, PROTIME in the last 168 hours. Thyroid Function Tests: No results for input(s): TSH, T4TOTAL, FREET4, T3FREE, THYROIDAB in the last 72 hours. Lipid Profile: No results for input(s): CHOL, HDL, LDLCALC, TRIG, CHOLHDL, LDLDIRECT in the last 72 hours. Anemia Panel: No results for input(s): VITAMINB12, FOLATE, FERRITIN, TIBC, IRON, RETICCTPCT in the last 72 hours. Urine analysis:    Component Value Date/Time   COLORURINE YELLOW 11/03/2019 [place] 11/03/2019 1207   LABSPEC 1.028 11/03/2019 1207   PHURINE 5.0 11/03/2019 1207   GLUCOSEU >=500 (A) 11/03/2019 1207   HGBUR NEGATIVE 11/03/2019 [place] 11/03/2019 1207   BILIRUBINUR Negative 02/12/2019 1019   KETONESUR 20 (A) 11/03/2019 1207   PROTEINUR 30 (A)  11/03/2019 1207   UROBILINOGEN 1.0 02/12/2019 1019   UROBILINOGEN 0.2 05/26/2012 1113   NITRITE NEGATIVE 11/03/2019 1207   LEUKOCYTESUR SMALL (A) 11/03/2019 1207   Sepsis Labs: Invalid input(s): PROCALCITONIN, [place]  Microbiology: Recent Results (from the past 240 hour(s))  Culture, blood (routine x 2)     Status: None (Preliminary result)   Collection Time: 11/03/19  1:58 PM   Specimen: BLOOD  Result Value Ref Range Status   Specimen Description   Final    BLOOD LEFT WRIST Performed at [facility] [address] [place], [place] [address]    Special Requests   Final    BOTTLES DRAWN AEROBIC AND ANAEROBIC Blood Culture adequate volume Performed at [facility] [address], [place], [place] [address]  Culture   Final    NO GROWTH 1 DAY Performed at [facility], [place] [address] [place], [place] [address]    Report Status PENDING  Incomplete  Culture, blood (routine x 2)     Status: None (Preliminary result)   Collection Time: 11/03/19  1:59 PM   Specimen: BLOOD  Result Value Ref Range Status   Specimen Description   Final    BLOOD LEFT ANTECUBITAL Performed at [facility] [address], [place], [place] [address]    Special Requests   Final    BOTTLES DRAWN AEROBIC AND ANAEROBIC Blood Culture results may not be optimal due to an inadequate volume of blood received in culture bottles Performed at [facility] [address] [place], [place] [address]    Culture   Final    NO GROWTH 1 DAY Performed at [facility], [place] [address] [place], [place] [address]    Report Status PENDING  Incomplete    Radiology Studies: No results found.  [name] T. Hardy  If 7PM-7AM, please contact night-coverage [email or web address] Password [facility] 11/05/2019, 3:26 PM

## 2019-11-05 NOTE — Progress Notes (Signed)
OT Cancellation Note  Patient Details Name: [name] MRN: [id number] DOB: 02-17-1931   Cancelled Treatment:    Reason Eval/Treat Not Completed: Other (comment).  Per chart review, pt needed total A for ADLs.  She needed total A +2 with PT today for bed mobility. Will defer OT needs to next venue.  [place] Haydon 11/05/2019, 2:11 PM  Karsten Ro, OTR/L Acute Rehabilitation Services 11/05/2019

## 2019-11-05 NOTE — Evaluation (Signed)
Physical Therapy Evaluation Patient Details Name: [name] MRN: [id number] DOB: 1930/12/09 Today's Date: 11/05/2019   History of Present Illness  86 year old female with history of advanced dementia, asthma, CAD, DM-2, HTN and recent UTI and COVID-19 infection brought to ED by EMS due to progressive altered mental status, lethargy, ambulatory dysfunction and hyperglycemia greater than 500. Pt admitted for Uncontrolled DM-2 with hyperglycemia in the setting of steroid for COVID-19 infection  Clinical Impression  Pt admitted with above diagnosis.  Pt currently with functional limitations due to the deficits listed below (see PT Problem List). Pt will benefit from skilled PT to increase their independence and safety with mobility to allow discharge to the venue listed below.  Pt with admission about a week again and appears to have declined in function and mobility.  Pt poor historian so uncertain of actual assist available at home however pt total assist for bed mobility today.  Recommend SNF upon d/c.     Follow Up Recommendations Supervision/Assistance - 24 hour;SNF    Equipment Recommendations  None recommended by PT    Recommendations for Other Services       Precautions / Restrictions Precautions Precautions: Fall      Mobility  Bed Mobility Overal bed mobility: Needs Assistance Bed Mobility: Supine to Sit;Sit to Supine     Supine to sit: Total assist Sit to supine: Total assist   General bed mobility comments: assist for upper and lower body, pt provided with multimodal cues, encouragement and time however pt not assisting  Transfers                    Ambulation/Gait                Stairs            Wheelchair Mobility    Modified Rankin (Stroke Patients Only)       Balance Overall balance assessment: Needs assistance Sitting-balance support: Bilateral upper extremity supported;Feet supported Sitting balance-Leahy Scale: Zero                                       Pertinent Vitals/Pain Pain Assessment: No/denies pain  SPO2 96% on room air upon sitting EOB    Home Living Family/patient expects to be discharged to:: Private residence Living Arrangements: Spouse/significant other Available Help at Discharge: Family Type of Home: House       Home Layout: Multi-level Home Equipment: Environmental consultant - 2 wheels Additional Comments: pt poor historian, reports her family assists at home    Prior Function Level of Independence: Needs assistance   Gait / Transfers Assistance Needed: per MD note, pt ambulated short distances with RW and daughter's help           Hand Dominance        Extremity/Trunk Assessment   Upper Extremity Assessment Upper Extremity Assessment: Generalized weakness    Lower Extremity Assessment Lower Extremity Assessment: Generalized weakness       Communication   Communication: Other (comment)(little verbalizations)  Cognition Arousal/Alertness: Awake/alert Behavior During Therapy: WFL for tasks assessed/performed Overall Cognitive Status: No family/caregiver present to determine baseline cognitive functioning Area of Impairment: Problem solving                         Safety/Judgement: Decreased awareness of deficits   Problem Solving: Requires verbal cues;Requires tactile cues General Comments: per  chart, she has dementia.      General Comments      Exercises     Assessment/Plan    PT Assessment Patient needs continued PT services  PT Problem List Decreased strength;Decreased activity tolerance;Decreased balance;Decreased mobility;Decreased cognition;Decreased knowledge of use of DME;Decreased safety awareness       PT Treatment Interventions DME instruction;Gait training;Functional mobility training;Neuromuscular re-education;Balance training;Therapeutic exercise;Therapeutic activities;Patient/family education;Wheelchair mobility training    PT  Goals (Current goals can be found in the Care Plan section)  Acute Rehab PT Goals PT Goal Formulation: Patient unable to participate in goal setting Time For Goal Achievement: 11/19/19 Potential to Achieve Goals: Fair    Frequency Min 2X/week   Barriers to discharge        Co-evaluation               AM-PAC PT "6 Clicks" Mobility  Outcome Measure Help needed turning from your back to your side while in a flat bed without using bedrails?: Total Help needed moving from lying on your back to sitting on the side of a flat bed without using bedrails?: Total Help needed moving to and from a bed to a chair (including a wheelchair)?: Total Help needed standing up from a chair using your arms ([place].g., wheelchair or bedside chair)?: Total Help needed to walk in hospital room?: Total Help needed climbing 3-5 steps with a railing? : Total 6 Click Score: 6    End of Session   Activity Tolerance: Patient limited by fatigue Patient left: in bed;with call bell/phone within reach;with bed alarm set Nurse Communication: Mobility status PT Visit Diagnosis: Muscle weakness (generalized) (M62.81);Other abnormalities of gait and mobility (R26.89)    Time: [id number] PT Time Calculation (min) (ACUTE ONLY): 18 min   Charges:   PT Evaluation $PT Eval Low Complexity: 1 Low         Kati PT, DPT Acute Rehabilitation Services Office: [phone]   Donnetta Clements,[name] 11/05/2019, 1:27 PM

## 2019-11-05 NOTE — TOC Initial Note (Signed)
Transition of Care [facility]) - Initial/Assessment Note    Patient Details  Name: [name] MRN: [id number] Date of Birth: [birth year]  [facility] CM/SW Contact:    Ross Ludwig, LCSW Phone Number: 11/05/2019, 5:28 PM  Clinical Narrative:                  Patient is an 86 year old female who is married and lives with her husband.  Patient has caregivers 24/7, and family are in quite involved.  CSW spoke to patient's daughter to complete assessment due to dementia, patient's daughter was informed that PT and OT are recommending SNF, however patient's family do not want her to go to a SNF.  Patient is active with [facility], patient's daughter would like to continue with the agency, home health can take patient back once she is ready for discharge.  CSW to continue to follow patient's progress, patient's daughter said they already have a hospital bed, bedside commode, and walker.  They did not express any other DME needs.  Expected Discharge Plan: [place] Barriers to Discharge: Continued Medical Work up   Patient Goals and CMS Choice Patient states their goals for this hospitalization and ongoing recovery are:: Per daughter she would like patient to return back home with home health services. CMS Medicare.gov Compare Post Acute Care list provided to:: Patient Represenative (must comment) Choice offered to / list presented to : Adult Children  Expected Discharge Plan and Services Expected Discharge Plan: [place] In-house Referral: Clinical Social Work   Post Acute Care Choice: [place] arrangements for the past 2 months: Single Family Home Expected Discharge Date: (unknown)               DME Arranged: N/A         HH Arranged: RN, PT, OT HH Agency: [facility] Date [place]: 11/05/19 Time [place]: 1726 Representative spoke with at [place]: [place]  Arrangements/Services Living arrangements for the past 2 months: [place] with:: Spouse Patient language and need for interpreter reviewed:: No Do you feel safe going back to the place where you live?: Yes      Need for Family Participation in Patient Care: Yes (Comment) Care giver support system in place?: Yes (comment) Current home services: Home PT, Home RN Criminal Activity/Legal Involvement Pertinent to Current Situation/Hospitalization: No - Comment as needed  Activities of Daily Living Home Assistive Devices/Equipment: Nebulizer, Environmental consultant (specify type), Eyeglasses ADL Screening (condition at time of admission) Patient's cognitive ability adequate to safely complete daily activities?: Yes Is the patient deaf or have difficulty hearing?: No Does the patient have difficulty seeing, even when wearing glasses/contacts?: Yes Does the patient have difficulty concentrating, remembering, or making decisions?: Yes Patient able to express need for assistance with ADLs?: Yes Does the patient have difficulty dressing or bathing?: Yes Independently performs ADLs?: No(patient very weak) Communication: Independent Is this a change from baseline?: Pre-admission baseline Dressing (OT): Dependent Is this a change from baseline?: Change from baseline, expected to last >3 days Grooming: Dependent Is this a change from baseline?: Change from baseline, expected to last >3 days Feeding: Dependent Is this a change from baseline?: Change from baseline, expected to last >3 days Bathing: Dependent Is this a change from baseline?: Change from baseline, expected to last >3 days Toileting: Dependent Is this a change from baseline?: Change from baseline, expected to last >3days In/Out  Bed: Dependent Walks in Home: Dependent Is this a change from baseline?: Change from baseline, expected to last >3 days Does the patient have difficulty walking or climbing stairs?: Yes(secondary from  weakness) Weakness of Legs: Both Weakness of Arms/Hands: Both  Permission Sought/Granted Permission sought to share information with : Family Supports, Case Manager Permission granted to share information with : Yes, Release of Information Signed  Share Information with NAME: Lanell Matar Daughter 0000000  or [place], Dub [phone]  Permission granted to share info w AGENCY: [facility]        Emotional Assessment Appearance:: Appears stated age   Affect (typically observed): Accepting, Appropriate, Calm Orientation: : Oriented to Self Alcohol / Substance Use: Not Applicable Psych Involvement: No (comment)  Admission diagnosis:  Encephalopathy [G93.40] Hyperglycemia [R73.9] Leukocytosis, unspecified type [D72.829] Patient Active Problem List   Diagnosis Date Noted  . Encephalopathy 11/04/2019  . Pressure injury of skin 11/04/2019  . Hyperglycemia 11/03/2019  . Aortic atherosclerosis ([place]) 10/30/2019  . Hyperlipidemia associated with type 2 diabetes mellitus ([place]) 10/30/2019  . Bradycardia with 51-60 beats per minute 10/24/2019  . COVID-19 virus infection 10/23/2019  . Protein-calorie malnutrition ([place]) 07/21/2018  . Osteoporosis 07/02/2018  . Diabetic ulcer of foot with fat layer exposed ([place]) 05/14/2018  . GERD (gastroesophageal reflux disease) 05/02/2018  . Generalized weakness 05/02/2018  . History of DVT in adulthood 06/26/2017  . LBBB (left bundle branch block) 10/09/2016  . Pleural effusion, left 09/23/2016  . Coronary artery calcification seen on CAT scan 08/28/2016  . Alzheimer's disease ([place]) 01/04/2016  . Pancreas cyst 07/02/2015  . Asthma, chronic 03/16/2015  . Primary open angle glaucoma of left eye, moderate stage 12/25/2014  . Pseudophakia of both eyes 02/15/2013  . Anemia 05/25/2012  . Astigmatism 04/29/2012  . Allergic rhinitis 10/30/2010  . Venous (peripheral) insufficiency 04/13/2007  . Diabetes ([place])  12/18/2006  . Hypertension associated with diabetes ([place]) 12/18/2006  . Atherosclerosis of native arteries of extremity with intermittent claudication ([place]) 12/18/2006  . OSTEOARTHRITIS OF SPINE, NOS 12/18/2006   PCP:  Marin Olp, MD Pharmacy:   [facility], [place] [address] Phone: [phone] Fax: [phone]     Social Determinants of Health (SDOH) Interventions    Readmission Risk Interventions No flowsheet data found.

## 2019-11-05 NOTE — Progress Notes (Signed)
Lovenox per Pharmacy for DVT Prophylaxis    Pharmacy has been consulted from dosing enoxaparin (lovenox) in this patient for DVT prophylaxis.  The pharmacist has reviewed pertinent labs (Hgb _13.5__; PLT__137_), patient weight (_58__kg) and renal function (CrCl_43__mL/min) and decided that enoxaparin _40_mg SQ Q24Hrs is appropriate for this patient.  The pharmacy department will sign off at this time.  Please reconsult pharmacy if status changes or for further issues.  Thank you  Cyndia Diver PharmD, BCPS  11/05/2019, 5:40 AM

## 2019-11-06 DIAGNOSIS — M6282 Rhabdomyolysis: Secondary | ICD-10-CM

## 2019-11-06 DIAGNOSIS — E871 Hypo-osmolality and hyponatremia: Secondary | ICD-10-CM

## 2019-11-06 LAB — CBC
HCT: 37.2 % (ref 36.0–46.0)
Hemoglobin: 12.4 g/dL (ref 12.0–15.0)
MCH: 32.6 pg (ref 26.0–34.0)
MCHC: 33.3 g/dL (ref 30.0–36.0)
MCV: 97.9 fL (ref 80.0–100.0)
Platelets: 133 10*3/uL — ABNORMAL LOW (ref 150–400)
RBC: 3.8 MIL/uL — ABNORMAL LOW (ref 3.87–5.11)
RDW: 12.6 % (ref 11.5–15.5)
WBC: 16.7 10*3/uL — ABNORMAL HIGH (ref 4.0–10.5)
nRBC: 0 % (ref 0.0–0.2)

## 2019-11-06 LAB — BASIC METABOLIC PANEL
Anion gap: 8 (ref 5–15)
BUN: 24 mg/dL — ABNORMAL HIGH (ref 8–23)
CO2: 26 mmol/L (ref 22–32)
Calcium: 8.7 mg/dL — ABNORMAL LOW (ref 8.9–10.3)
Chloride: 99 mmol/L (ref 98–111)
Creatinine, Ser: 0.68 mg/dL (ref 0.44–1.00)
GFR calc Af Amer: >60 mL/min (ref >60)
GFR calc non Af Amer: >60 mL/min (ref >60)
Glucose, Bld: 175 mg/dL — ABNORMAL HIGH (ref 70–99)
Potassium: 3.6 mmol/L (ref 3.5–5.1)
Sodium: 133 mmol/L — ABNORMAL LOW (ref 135–145)

## 2019-11-06 LAB — GLUCOSE, CAPILLARY
Glucose-Capillary: 126 mg/dL — ABNORMAL HIGH (ref 70–99)
Glucose-Capillary: 171 mg/dL — ABNORMAL HIGH (ref 70–99)
Glucose-Capillary: 151 mg/dL — ABNORMAL HIGH (ref 70–99)
Glucose-Capillary: 127 mg/dL — ABNORMAL HIGH (ref 70–99)

## 2019-11-06 LAB — MAGNESIUM: Magnesium: 2 mg/dL (ref 1.7–2.4)

## 2019-11-06 LAB — PHOSPHORUS: Phosphorus: 2.2 mg/dL — ABNORMAL LOW (ref 2.5–4.6)

## 2019-11-06 LAB — CK: Total CK: 525 U/L — ABNORMAL HIGH (ref 38–234)

## 2019-11-06 MED ORDER — SENNOSIDES-DOCUSATE SODIUM 8.6-50 MG PO TABS: 1.0000 | ORAL_TABLET | Freq: Two times a day (BID) | ORAL | 0 refills | Status: AC | PRN

## 2019-11-06 MED ORDER — METFORMIN HCL 500 MG PO TABS
500.0000 mg | ORAL_TABLET | Freq: Two times a day (BID) | ORAL | Status: DC
  Administered 2019-11-06 – 2019-11-07 (×3): 500 mg via ORAL
  Filled 2019-11-06 (×3): qty 1

## 2019-11-06 MED ORDER — GUAIFENESIN 100 MG/5ML PO SOLN
20.0000 mL | Freq: Three times a day (TID) | ORAL | Status: DC
  Administered 2019-11-06 – 2019-11-07 (×5): 400 mg via ORAL
  Filled 2019-11-06 (×3): qty 10
  Filled 2019-11-06: qty 20
  Filled 2019-11-06: qty 10

## 2019-11-06 MED ORDER — POTASSIUM CHLORIDE IN NACL 20-0.9 MEQ/L-% IV SOLN
INTRAVENOUS | Status: DC
  Filled 2019-11-06 (×4): qty 1000

## 2019-11-06 MED ORDER — METFORMIN HCL 500 MG PO TABS: 500.0000 mg | ORAL_TABLET | Freq: Two times a day (BID) | ORAL | 1 refills | Status: DC

## 2019-11-06 MED ORDER — AMLODIPINE BESYLATE 10 MG PO TABS: 10.0000 mg | ORAL_TABLET | Freq: Every day | ORAL | 1 refills | Status: DC

## 2019-11-06 MED ORDER — SODIUM CHLORIDE 0.9 % IV BOLUS
250.0000 mL | Freq: Once | INTRAVENOUS | Status: AC
  Administered 2019-11-06: 250 mL via INTRAVENOUS

## 2019-11-06 MED ORDER — POTASSIUM PHOSPHATES 15 MMOLE/5ML IV SOLN
10.0000 mmol | Freq: Once | INTRAVENOUS | Status: AC
  Administered 2019-11-06: 10 mmol via INTRAVENOUS
  Filled 2019-11-06: qty 3.33

## 2019-11-06 NOTE — Progress Notes (Addendum)
Total urine output for the day is 300cc's.  MD made aware.  Bladder scan showing 250cc's.  Will continue to monitor and will pass on information to night shift to re scan in a few hours.

## 2019-11-06 NOTE — Progress Notes (Signed)
Patient has urine output of 187ml for 11hrs; paged provider on call with new order received.

## 2019-11-06 NOTE — Progress Notes (Signed)
PROGRESS NOTE  [name] [id number] DOB: 18-Oct-1931   PCP: Marin Olp, MD  Patient is from: Home.  Ambulates with walker and assistance at baseline.  Dependent for most ADLs.  DOA: 11/03/2019 LOS: 2  Brief Narrative / Interim history: 86 year old female with history of advanced dementia, asthma, CAD, DM-2, HTN and recent UTI and COVID-19 infection brought to ED by EMS due to progressive altered mental status, lethargy, ambulatory dysfunction and hyperglycemia greater than 500.   In ED, hypertensive.  100% on 2 L by [place].  WBC 24.  Glucose 515.  AG 14.  Bicarb within normal.  CT head, CXR and CT abdomen and pelvis without acute finding.  Received IV fluid and hospital service was called for admission for hypoglycemia, altered mental status and ambulatory dysfunction.  She was started on IV fluids and electrolyte replenishment with improvement in her mental status and hypoglycemia. Still with significant ambulatory dysfunction. Evaluated by PT/OT who recommended SNF.  Subjective: No major events overnight of this morning. Sitting in bed eating breakfast. She says she likes to go home. She is oriented to self, place and family's name. Denies chest pain, dyspnea, GI or UTI symptoms.  Objective: Vitals:   11/05/19 1638 11/05/19 2108 11/06/19 0513 11/06/19 1202  BP:  (!) 139/59 (!) 133/57 133/62  Pulse:  79 63 67  Resp:  (!) 24 (!) 24 18  Temp: 98.4 F (36.9 C) 99.3 F (37.4 C) 98.6 F (37 C) 98.8 F (37.1 C)  TempSrc: Oral Oral Oral Oral  SpO2:  96% 95% 95%  Weight:      Height:        Intake/Output Summary (Last 24 hours) at 11/06/2019 1228 Last data filed at 11/06/2019 [id number] Gross per 24 hour  Intake 370 ml  Output 700 ml  Net -330 ml   Filed Weights   11/04/19 1945  Weight: 58.9 kg    Examination:  GENERAL: No apparent distress. Nontoxic. Sitting in bed eating breakfast. HEENT: MMM.  Vision and hearing grossly intact.  NECK: Supple.  No apparent JVD.  RESP:  On RA. No IWOB. Fair aeration bilaterally. CVS:  RRR. Heart sounds normal.  ABD/GI/GU: Bowel sounds present. Soft. Non tender.  MSK/EXT:  Moves extremities. No apparent deformity. No edema.  SKIN: Pressure injury of skin as below. NEURO: Awake, alert and oriented to self, place and family name. No apparent focal neuro deficit. Moves all extremities. PSYCH: Calm. Normal affect.  Procedures:  None  Assessment & Plan: Uncontrolled DM-2 with hyperglycemia in the setting of steroid for COVID-19 infection: A1c 6.9% Recent Labs    11/05/19 2104 11/06/19 0802 11/06/19 1120  GLUCAP 255* 126* 171*  -discontinue Levemir -continue SSI-thin and Tradjenta. -Low-dose Metformin on discharge.  Acute COVID-19 infection: Presented with lethargy and deconditioning. On room air.  CXR without acute finding.  Completed course of remdesivir and Decadron outpatient. -Supportive care with inhalers, mucolytic's, antitussive, vitamins and incentive spirometry. -PT/OT-recommended SNF but family prefer home with home health.  Mild nontraumatic rhabdomyolysis: CK elevated to 525.  Could be remnant from COVID-19 infection or immobility. -IV NS-KCl at 100 cc an hour for 24 hours. -Recheck CK in the morning.  Acute metabolic encephalopathy in patient with history of Alzheimer's dementia: Likely due to COVID-19 infection and dehydration.  UDS negative.  No obvious source of infection.  CT head without acute finding.  No apparent focal neuro deficit. Improved. She is oriented to self, place and family name -Treat treatable causes. -Supportive care, precordial  ventilation and delirium precautions. -Continue home Aricept, Namenda and Celexa  Debility/generalized weakness/ambulatory dysfunction: Likely due to COVID-19 infection.  She also have mild rhabdo.  Ambulates with walker and assistance at baseline.  Dependent for most ADL's. -PT/OT and home health as above.  AKI: Likely prerenal in the setting of  dehydration.  Resolved. -IV fluid as above.  Hypernatremia: Resolved.  Now with hyponatremia.  Hyponatremia: Na 133. -IV normal saline as above  Hypokalemia/hypophosphatemia: Magnesium within normal. -Potassium phosphate 10 mmol -Recheck in the morning.  Leukocytosis/bandemia: Likely demargination due to recent steroid.  No obvious source of infection so far.  CXR and CT abdomen and pelvis without acute finding.  Blood cultures negative.  Improving. -Recheck in the morning.  Essential hypertension: Normotensive for most part. -Continue amlodipine 10 mg daily -Hydralazine 25 mg 3 times daily as needed  Debility/generalized weakness/physical deconditioning: Multifactorial-COVID-19 infection, underlying dementia, dehydration and electrolyte derangement -PT/OT.  Goal of care discussion: Patient is full code.  Has advanced dementia and comorbidities as above.  Low palliative performance score.  Appropriate for hospice.  We discussed about pros and cons of her CODE STATUS with patient's daughter at bedside on 1/14.  I recommended DNR/DNI as resuscitation could pose more harm than benefits.  However, patient's daughter chose to maintain full CODE STATUS until further discussion with patient's husband. -Remains full code   Pressure Injury 11/04/19 Buttocks Right Stage 2 -  Partial thickness loss of dermis presenting as a shallow open injury with a red, pink wound bed without slough. non-blanchable, small open area (Active)  11/04/19 1830  Location: Buttocks  Location Orientation: Right  Staging: Stage 2 -  Partial thickness loss of dermis presenting as a shallow open injury with a red, pink wound bed without slough.  Wound Description (Comments): non-blanchable, small open area  Present on Admission: Yes     Pressure Injury 11/04/19 Heel Left;Right (Active)  11/04/19 1830  Location: Heel  Location Orientation: Left;Right  Staging:   Wound Description (Comments):   Present on  Admission:      Pressure Injury 11/04/19 Sacrum Medial Deep Tissue Pressure Injury - Purple or maroon localized area of discolored intact skin or blood-filled blister due to damage of underlying soft tissue from pressure and/or shear. purple, no blister (Active)  11/04/19 1800  Location: Sacrum  Location Orientation: Medial  Staging: Deep Tissue Pressure Injury - Purple or maroon localized area of discolored intact skin or blood-filled blister due to damage of underlying soft tissue from pressure and/or shear.  Wound Description (Comments): purple, no blister  Present on Admission: Yes               DVT prophylaxis: Subcu Lovenox Code Status: Full code-confirmed with patient's daughter at bedside 1/14. Family Communication: Updated patient's daughter over the phone. Disposition Plan: Remains inpatient due to significant weakness, rhabdomyolysis and electrolyte derangement.  Likely discharge home 1/17.  HH already been ordered.  She has DME. Consultants: None   Microbiology summarized: 10/20/2019-COVID-19 positive. 11/02/2018-blood cultures negative.  Sch Meds:  Scheduled Meds: . amLODipine  10 mg Oral Daily  . ascorbic acid  500 mg Oral BID  . aspirin EC  81 mg Oral Daily  . brimonidine  1 drop Both Eyes BID  . budesonide  2 puff Inhalation Daily  . calcium carbonate  1,250 mg Oral BID WC  . citalopram  10 mg Oral Daily  . donepezil  10 mg Oral QHS  . enoxaparin (LOVENOX) injection  40 mg Subcutaneous q1800  .  famotidine  20 mg Oral Daily  . ferrous sulfate  325 mg Oral Q breakfast  . guaiFENesin  20 mL Oral Q8H  . insulin aspart  0-5 Units Subcutaneous QHS  . insulin aspart  0-9 Units Subcutaneous TID WC  . linagliptin  5 mg Oral Daily  . memantine  10 mg Oral BID  . metFORMIN  500 mg Oral BID WC  . multivitamin with minerals  1 tablet Oral Daily  . zinc sulfate  220 mg Oral Daily   Continuous Infusions: . 0.9 % NaCl with KCl 20 mEq / L 100 mL/hr at 11/06/19 1154    . potassium PHOSPHATE IVPB (in mmol) 10 mmol (11/06/19 0845)   PRN Meds:.acetaminophen **OR** acetaminophen, albuterol, hydrALAZINE, ondansetron **OR** ondansetron (ZOFRAN) IV  Antimicrobials: Anti-infectives (From admission, onward)   None       I have personally reviewed the following labs and images: CBC: Recent Labs  Lab 11/03/19 1207 11/04/19 0452 11/05/19 0232 11/06/19 0423  WBC 24.0* 21.9* 22.6* 16.7*  NEUTROABS 21.9*  --   --   --   HGB 15.9* 12.7 13.5 12.4  HCT 47.9* 38.3 39.5 37.2  MCV 99.6 101.6* 99.2 97.9  PLT 202 152 137* 133*   BMP &GFR Recent Labs  Lab 11/03/19 1207 11/04/19 0452 11/04/19 1908 11/05/19 0232 11/06/19 0423  NA 145 147* 140 139 133*  K 3.8 3.0* 3.5 3.6 3.6  CL 105 116* 105 104 99  CO2 26 26 29 27 26   GLUCOSE 515* 142* 174* 137* 175*  BUN 47* 31* 26* 22 24*  CREATININE 1.15* 0.54 0.81 0.59 0.68  CALCIUM 9.9 8.1* 8.9 8.6* 8.7*  MG  --  2.0  --  1.9 2.0  PHOS  --  1.3*  --  2.0* 2.2*   Estimated Creatinine Clearance: 43.6 mL/min (by C-G formula based on SCr of 0.68 mg/dL). Liver & Pancreas: Recent Labs  Lab 11/03/19 1207  AST 21  ALT 20  ALKPHOS 66  BILITOT 1.5*  PROT 7.0  ALBUMIN 3.7   Recent Labs  Lab 11/03/19 1713  LIPASE 17   No results for input(s): AMMONIA in the last 168 hours. Diabetic: No results for input(s): HGBA1C in the last 72 hours. Recent Labs  Lab 11/05/19 1102 11/05/19 1628 11/05/19 2104 11/06/19 0802 11/06/19 1120  GLUCAP 162* 143* 255* 126* 171*   Cardiac Enzymes: Recent Labs  Lab 11/06/19 1017  CKTOTAL 525*   No results for input(s): PROBNP in the last 8760 hours. Coagulation Profile: No results for input(s): INR, PROTIME in the last 168 hours. Thyroid Function Tests: No results for input(s): TSH, T4TOTAL, FREET4, T3FREE, THYROIDAB in the last 72 hours. Lipid Profile: No results for input(s): CHOL, HDL, LDLCALC, TRIG, CHOLHDL, LDLDIRECT in the last 72 hours. Anemia Panel: No  results for input(s): VITAMINB12, FOLATE, FERRITIN, TIBC, IRON, RETICCTPCT in the last 72 hours. Urine analysis:    Component Value Date/Time   COLORURINE YELLOW 11/03/2019 [place] 11/03/2019 1207   LABSPEC 1.028 11/03/2019 1207   PHURINE 5.0 11/03/2019 1207   GLUCOSEU >=500 (A) 11/03/2019 1207   HGBUR NEGATIVE 11/03/2019 [place] 11/03/2019 1207   BILIRUBINUR Negative 02/12/2019 1019   KETONESUR 20 (A) 11/03/2019 1207   PROTEINUR 30 (A) 11/03/2019 1207   UROBILINOGEN 1.0 02/12/2019 1019   UROBILINOGEN 0.2 05/26/2012 1113   NITRITE NEGATIVE 11/03/2019 1207   LEUKOCYTESUR SMALL (A) 11/03/2019 1207   Sepsis Labs: Invalid input(s): PROCALCITONIN, LACTICIDVEN  Microbiology: Recent Results (from the past 240 hour(s))  Culture, blood (routine x 2)     Status: None (Preliminary result)   Collection Time: 11/03/19  1:58 PM   Specimen: BLOOD  Result Value Ref Range Status   Specimen Description   Final    BLOOD LEFT WRIST Performed at [facility] [address], [place], [place] [address]    Special Requests   Final    BOTTLES DRAWN AEROBIC AND ANAEROBIC Blood Culture adequate volume Performed at [facility] [address], [place], [place] [address]    Culture   Final    NO GROWTH 3 DAYS Performed at [facility], [place] [address], [place], [place] [address]    Report Status PENDING  Incomplete  Culture, blood (routine x 2)     Status: None (Preliminary result)   Collection Time: 11/03/19  1:59 PM   Specimen: BLOOD  Result Value Ref Range Status   Specimen Description   Final    BLOOD LEFT ANTECUBITAL Performed at [facility] [address], [place], [place] [address]    Special Requests   Final    BOTTLES DRAWN AEROBIC AND ANAEROBIC Blood Culture results may not be optimal due to an inadequate volume of blood received in culture bottles Performed at [facility] [address] [place], [place] [address]    Culture   Final    NO GROWTH 3 DAYS Performed at [facility], [place] [address], [place], [place] [address]    Report Status PENDING  Incomplete    Radiology Studies: No results found.  Aasia Peavler T. [place]  If 7PM-7AM, please contact night-coverage [email or web address] Password [facility] 11/06/2019, 12:28 PM

## 2019-11-07 LAB — BASIC METABOLIC PANEL
Anion gap: 5 (ref 5–15)
BUN: 17 mg/dL (ref 8–23)
CO2: 25 mmol/L (ref 22–32)
Calcium: 8.4 mg/dL — ABNORMAL LOW (ref 8.9–10.3)
Chloride: 106 mmol/L (ref 98–111)
Creatinine, Ser: 0.6 mg/dL (ref 0.44–1.00)
GFR calc Af Amer: >60 mL/min (ref >60)
GFR calc non Af Amer: >60 mL/min (ref >60)
Glucose, Bld: 101 mg/dL — ABNORMAL HIGH (ref 70–99)
Potassium: 4.4 mmol/L (ref 3.5–5.1)
Sodium: 136 mmol/L (ref 135–145)

## 2019-11-07 LAB — CBC
HCT: 34.2 % — ABNORMAL LOW (ref 36.0–46.0)
Hemoglobin: 11.5 g/dL — ABNORMAL LOW (ref 12.0–15.0)
MCH: 33 pg (ref 26.0–34.0)
MCHC: 33.6 g/dL (ref 30.0–36.0)
MCV: 98.3 fL (ref 80.0–100.0)
Platelets: 120 10*3/uL — ABNORMAL LOW (ref 150–400)
RBC: 3.48 MIL/uL — ABNORMAL LOW (ref 3.87–5.11)
RDW: 12.8 % (ref 11.5–15.5)
WBC: 11.9 10*3/uL — ABNORMAL HIGH (ref 4.0–10.5)
nRBC: 0 % (ref 0.0–0.2)

## 2019-11-07 LAB — PHOSPHORUS: Phosphorus: 2.1 mg/dL — ABNORMAL LOW (ref 2.5–4.6)

## 2019-11-07 LAB — GLUCOSE, CAPILLARY
Glucose-Capillary: 95 mg/dL (ref 70–99)
Glucose-Capillary: 162 mg/dL — ABNORMAL HIGH (ref 70–99)

## 2019-11-07 LAB — MAGNESIUM: Magnesium: 1.7 mg/dL (ref 1.7–2.4)

## 2019-11-07 LAB — CK: Total CK: 106 U/L (ref 38–234)

## 2019-11-07 MED ORDER — K PHOS MONO-SOD PHOS DI & MONO 155-852-130 MG PO TABS: 250.0000 mg | ORAL_TABLET | Freq: Every day | ORAL | 0 refills | Status: DC

## 2019-11-07 MED ORDER — K PHOS MONO-SOD PHOS DI & MONO 155-852-130 MG PO TABS
250.0000 mg | ORAL_TABLET | Freq: Every day | ORAL | Status: DC
  Filled 2019-11-07: qty 1

## 2019-11-07 NOTE — Discharge Summary (Signed)
Physician Discharge Summary  [name] [id number] DOB: 1931/09/16 DOA: 11/03/2019  PCP: Marin Olp, MD  Admit date: 11/03/2019 Discharge date: 11/07/2019  Admitted From: Home Disposition:  home  Recommendations for Outpatient Follow-up:  1. Recommend initiation of CODE STATUS and overall goals of care discussion as outpatient.  2. Amlodipine added for hypertension.  Monitor BP and adjust BP medications as needed. 3. Metformin started for diabetes mellitus.  Follow-up blood sugars and adjust dose as needed. 4. Follow-up phosphorus, potassium.  Discharged on phosphorus supplements for hypophosphatemia.  Home Health: Yes PT/OT/RN Equipment/Devices: None  Discharge Condition: Fair (PT/OT recommended SNF for rehab but patient's daughter requested for her to go home) CODE STATUS: Full code Diet recommendation: Regular  Brief/Interim Summary: 46-year Caucasian female with advanced dementia, diabetes, hypertension, hyperlipidemia presented to the ED with lethargy after recent admission for COVID-19 infection and UTI.  She was noted to have blood sugars in the 500s (likely in setting of dexamethasone), leukocytosis with WBC count 24,000.  BP was elevated on admission otherwise she was saturating well on room air.  CT head and CT abdomen pelvis showed no signs of acute abnormality.  Chest x-ray showed no pneumonia.  She received 2 L IV fluids in the ED.  She had completed course of remdesivir during last hospitalization but airborne isolation/precautions were continued given it is within 3 weeks.  Electrolytes were replaced.  She was evaluated by PT/OT who recommended SNF placement for short-term rehab but patient's daughter requested she return home with home health services.  She remained hemodynamically stable, afebrile for the last 24-48 hrs. She is awake but has significant debility/deconditioning with recent COVID-19 infection and underlying dementia/medical  comorbidities/age.  Discharge Diagnoses:  Principal Problem:   Hyperglycemia Active Problems:   Diabetes ([place])   Hypertension associated with diabetes ([place])   Alzheimer's disease ([place])   GERD (gastroesophageal reflux disease)   Hyperlipidemia associated with type 2 diabetes mellitus ([place])   Encephalopathy   Pressure injury of skin    Discharge Instructions:  Discharge Instructions    MyChart COVID-19 home monitoring program   Complete by: Nov 06, 2019    Is the patient willing to use the [employer] for home monitoring?: Yes   Temperature monitoring   Complete by: Nov 06, 2019    After how many days would you like to receive a notification of this patient's flowsheet entries?: 1   Call MD for:  difficulty breathing, headache or visual disturbances   Complete by: As directed    Call MD for:  extreme fatigue   Complete by: As directed    Call MD for:  persistant dizziness or light-headedness   Complete by: As directed    Call MD for:  persistant nausea and vomiting   Complete by: As directed    Call MD for:  severe uncontrolled pain   Complete by: As directed    Diet general   Complete by: As directed    Discharge instructions   Complete by: As directed    It has been a pleasure taking care of you! You were hospitalized and treated for elevated blood glucose, electrolyte abnormalities and generalized weakness.  Your glucose is now within fair range.  The electrolyte issues have resolved.  We have ordered therapist for therapeutic exercise and  help you regain your strength.  There could be some adjustment to your home medications or new medications during this hospitalization. Please review your new medication list and the directions before you  take your medications.  Please call your primary care doctor office as soon as possible to schedule hospital follow-up visit in 1 to 2 weeks.   Take care,   Increase activity slowly   Complete by: As directed       Allergies as of 11/07/2019   No Known Allergies     Medication List    TAKE these medications   albuterol (2.5 MG/3ML) 0.083% nebulizer solution Commonly known as: PROVENTIL USE THREE MILLILITERS VIA NEBULIZATION BY MOUTH EVERY 6 HOURS AS NEEDED FOR WHEEZING OR SHORTNESS OF BREATH What changed:   how much to take  how to take this  when to take this  additional instructions Notes to patient: As needed per instructions   albuterol 108 (90 Base) MCG/ACT inhaler Commonly known as: ProAir HFA 2 PUFFS EVERY 6 HOURS AS NEEDED FOR SHORTNESS OF BREATH. What changed:   how much to take  how to take this  when to take this  reasons to take this  additional instructions Notes to patient: As needed per instructions   Alphagan P 0.1 % Soln Generic drug: brimonidine Place 1 drop into both eyes 2 (two) times daily. Notes to patient: Tonight at bedtime 1/17   amLODipine 10 MG tablet Commonly known as: NORVASC Take 1 tablet (10 mg total) by mouth daily. Notes to patient: Tomorrow AM 1/18   ascorbic acid 500 MG tablet Commonly known as: VITAMIN C Take 500 mg by mouth 2 (two) times daily. Notes to patient: Tonight at bedtime 1/17   aspirin 81 MG tablet Take 81 mg by mouth daily. Notes to patient: Tomorrow AM 1/18   budesonide 0.25 MG/2ML nebulizer solution Commonly known as: PULMICORT Take 2 mLs (0.25 mg total) by nebulization daily. ICD10 Code-J45.909 What changed: when to take this Notes to patient: Daily   calcium carbonate 600 MG Tabs tablet Commonly known as: OS-CAL Take 600 mg by mouth 2 (two) times daily with a meal. Notes to patient: Tonight at bedtime 1/17   citalopram 10 MG tablet Commonly known as: CELEXA TAKE 1 TABLET ONCE DAILY. Notes to patient: Tomorrow AM 1/18   donepezil 10 MG tablet Commonly known as: ARICEPT TAKE ONE TABLET AT BEDTIME. Notes to patient: Tonight at bedtime 1/17   famotidine 20 MG tablet Commonly known as: PEPCID TAKE  ONE TABLET TWICE A DAY AS NEEDED FOR HEARTBURN OR INDIGESTION. What changed: See the new instructions. Notes to patient: Tonight at bedtime if needed 1/17   ferrous sulfate 325 (65 FE) MG EC tablet Take 325 mg by mouth daily with breakfast. Notes to patient: Tomorrow AM 1/18    memantine 10 MG tablet Commonly known as: NAMENDA TAKE 1 TABLET TWICE DAILY FOR MEMORY. What changed: See the new instructions. Notes to patient: Tonight at bedtime 1/17   metFORMIN 500 MG tablet Commonly known as: GLUCOPHAGE Take 1 tablet (500 mg total) by mouth 2 (two) times daily with a meal. Notes to patient: Tonight at mealtime 1/17   multivitamin with minerals Tabs tablet Take 1 tablet by mouth daily. Notes to patient: Tomorrow AM 1/18   phosphorus 155-852-130 MG tablet Commonly known as: K PHOS NEUTRAL Take 1 tablet (250 mg total) by mouth daily. Notes to patient: Tomorrow AM 1/18   senna-docusate 8.6-50 MG tablet Commonly known as: Senokot-S Take 1 tablet by mouth 2 (two) times daily between meals as needed for mild constipation. Notes to patient: Tomorrow 1/18 as needed      Follow-up Information  Marin Olp, MD. Schedule an appointment as soon as possible for a visit in 2 week(s).   Specialty: Family Medicine Contact information: [place] [address] [phone]          No Known Allergies  Consultations:  None   Procedures/Studies: DG Chest 2 View  Result Date: 10/20/2019 CLINICAL DATA:  Altered mental status. EXAM: CHEST - 2 VIEW COMPARISON:  07/21/2018 FINDINGS: The cardiomediastinal silhouette is unchanged with borderline cardiomegaly. The lungs remain hyperinflated with chronic interstitial coarsening. Mild asymmetric opacities at the left lung base are similar to the prior study and likely reflect scarring. There is a persistent small left pleural effusion. No acute airspace consolidation, edema, or pneumothorax is identified. No acute  osseous abnormality is seen. IMPRESSION: COPD with an unchanged small left pleural effusion. No evidence of acute airspace disease. Electronically Signed   By: Logan Bores M.D.   On: 10/20/2019 16:58   CT Head Wo Contrast  Result Date: 11/03/2019 CLINICAL DATA:  EMS called by family saying patient was "unconscious" but per EMS - patient was just lethargic and responsive to painful stimuli Hx: dementia, speaks with confusionHad COVID (out of 14 day window per daughter) and UTI EXAM: CT HEAD WITHOUT CONTRAST TECHNIQUE: Contiguous axial images were obtained from the base of the skull through the vertex without intravenous contrast. COMPARISON:  10/20/2019 FINDINGS: Brain: No evidence of acute infarction, hemorrhage, hydrocephalus, extra-axial collection or mass lesion/mass effect. Ventricular and sulcal enlargement reflecting age related volume loss. Patchy hypoattenuation in the white matter is noted consistent mild to moderate chronic microvascular ischemic change. Vascular: No hyperdense vessel or unexpected calcification. Skull: Normal. Negative for fracture or focal lesion. Sinuses/Orbits: Globes and orbits are unremarkable. Visualized sinuses and mastoid air cells are clear. Other: None. IMPRESSION: 1. No acute intracranial abnormalities. 2. Age related volume loss. Mild to moderate chronic microvascular ischemic change. Electronically Signed   By: Lajean Manes M.D.   On: 11/03/2019 15:51   CT Head Wo Contrast  Result Date: 10/20/2019 CLINICAL DATA:  Altered mental status. EXAM: CT HEAD WITHOUT CONTRAST TECHNIQUE: Contiguous axial images were obtained from the base of the skull through the vertex without intravenous contrast. COMPARISON:  CT head dated February 11, 2019. FINDINGS: Brain: No evidence of acute infarction, hemorrhage, hydrocephalus, extra-axial collection or mass lesion/mass effect. Stable atrophy and chronic microvascular ischemic changes. Vascular: Calcified atherosclerosis at the  skullbase. No hyperdense vessel. Skull: Normal. Negative for fracture or focal lesion. Sinuses/Orbits: No acute finding. Other: None. IMPRESSION: 1.  No acute intracranial abnormality. Electronically Signed   By: Titus Dubin M.D.   On: 10/20/2019 16:49   CT ABDOMEN PELVIS W CONTRAST  Result Date: 11/03/2019 CLINICAL DATA:  Lethargy.  Dementia.  Abdominal pain.  UTI. EXAM: CT ABDOMEN AND PELVIS WITH CONTRAST TECHNIQUE: Multidetector CT imaging of the abdomen and pelvis was performed using the standard protocol following bolus administration of intravenous contrast. CONTRAST:  151mL OMNIPAQUE IOHEXOL 300 MG/ML  SOLN COMPARISON:  06/18/2013 MRI abdomen. FINDINGS: Scan limited by motion degradation. Lower chest: Several (greater than 5) solid pulmonary nodules scattered at both lung bases, largest 4 mm in the anterior right lower lobe (series 6/image 5), unchanged since 09/20/2013 chest CT, considered benign. Thick curvilinear bandlike consolidation in the basilar left lower lobe with associated volume loss and distortion and with smooth left pleural thickening, compatible with pleural-parenchymal scarring. Coronary atherosclerosis. Hepatobiliary: Normal liver size. No liver mass. No definite gallbladder wall thickening. No radiopaque cholelithiasis. No  biliary ductal dilatation. Pancreas: Diffuse pancreatic parenchymal atrophy. Irregular diffuse pancreatic duct dilation up to 10 mm diameter. Coarse calcification in the pancreatic head. These findings are unchanged since 2014 MRI and compatible with chronic pancreatitis. No discrete pancreatic mass. No peripancreatic fluid collections. Spleen: Normal size. No mass. Adrenals/Urinary Tract: Normal adrenals. Simple 3.8 cm lower left renal cyst. Subcentimeter hypodense renal cortical lesions scattered in both kidneys are too small to characterize and require no follow-up. Numerous parapelvic renal cysts in both kidneys, left greater than right. No hydronephrosis.  Normal bladder. Stomach/Bowel: Small hiatal hernia. Otherwise normal nondistended stomach. Normal caliber small bowel with no small bowel wall thickening. Normal appendix. Moderate sigmoid diverticulosis, with no large bowel wall thickening or acute pericolonic fat stranding. Vascular/Lymphatic: Atherosclerotic nonaneurysmal abdominal aorta. Patent portal, splenic, hepatic and renal veins. No pathologically enlarged lymph nodes in the abdomen or pelvis. Reproductive: Grossly normal uterus.  No adnexal mass. Other: No pneumoperitoneum, ascites or focal fluid collection. Musculoskeletal: No aggressive appearing focal osseous lesions. Mild thoracolumbar spondylosis. IMPRESSION: 1. No acute abnormality. No evidence of bowel obstruction or acute bowel inflammation. Normal appendix. Moderate sigmoid diverticulosis, with no evidence of acute diverticulitis. 2. Chronic pancreatitis. 3. Small hiatal hernia. 4. Thick pleural-parenchymal scarring at the left lung base. 5.  Aortic Atherosclerosis (ICD10-I70.0). Electronically Signed   By: Ilona Sorrel M.D.   On: 11/03/2019 16:00   DG Chest Portable 1 View  Result Date: 11/03/2019 CLINICAL DATA:  History of COVID.  Altered mental status, lethargy. EXAM: PORTABLE CHEST 1 VIEW COMPARISON:  10/23/2019 and CT chest 08/28/2016. FINDINGS: Trachea is midline. Heart size stable. Thoracic aorta is calcified. Lungs are hyperinflated with linear opacification in the left lower lobe. Tiny left pleural effusion. IMPRESSION: 1. Left lower lobe atelectasis or scarring. 2. Tiny left pleural effusion. Electronically Signed   By: Lorin Picket M.D.   On: 11/03/2019 13:27   DG Chest Port 1 View  Result Date: 10/23/2019 CLINICAL DATA:  COVID positive EXAM: PORTABLE CHEST 1 VIEW COMPARISON:  October 20, 2019 FINDINGS: Again noted are findings of COPD. There is no pneumothorax. There may be a small left-sided pleural effusion. The heart size is stable from prior study. Aortic calcifications  are noted. There is no acute osseous abnormality detected. IMPRESSION: 1. Stable appearance of the chest with a persistent small left-sided pleural effusion. 2. COPD. 3. Aortic calcifications are again noted. Electronically Signed   By: Constance Holster M.D.   On: 10/23/2019 15:42       Subjective: Opens eyes to verbal commands.  Oriented to self only.  No new complaints.  Follows commands intermittently.  Discharge Exam: Vitals:   11/07/19 0604 11/07/19 1236  BP: (!) 121/54 (!) 143/64  Pulse: 66 70  Resp: 20 19  Temp: 98.6 F (37 C) (!) 97.4 F (36.3 C)  SpO2: 95% 99%   Vitals:   11/06/19 1202 11/06/19 2208 11/07/19 0604 11/07/19 1236  BP: 133/62 (!) 107/46 (!) 121/54 (!) 143/64  Pulse: 67 68 66 70  Resp: 18 20 20 19   Temp: 98.8 F (37.1 C) (!) 97.5 F (36.4 C) 98.6 F (37 C) (!) 97.4 F (36.3 C)  TempSrc: Oral Oral Oral   SpO2: 95% 95% 95% 99%  Weight:      Height:        General: Patient awakens to verbal commands, not in acute distress Cardiovascular: RRR, S1/S2 +, no rubs Respiratory: CTA bilaterally, no wheezing, no rhonchi.  No accessory muscle use Abdominal: Soft, NT,  ND, bowel sounds + Extremities: no edema, no cyanosis   The results of significant diagnostics from this hospitalization (including imaging, microbiology, ancillary and laboratory) are listed below for reference.     Microbiology: Recent Results (from the past 240 hour(s))  Culture, blood (routine x 2)     Status: None (Preliminary result)   Collection Time: 11/03/19  1:58 PM   Specimen: BLOOD  Result Value Ref Range Status   Specimen Description   Final    BLOOD LEFT WRIST Performed at [facility] [address], [place], [place] [address]    Special Requests   Final    BOTTLES DRAWN AEROBIC AND ANAEROBIC Blood Culture adequate volume Performed at [facility] [address] [place], [place] [address]    Culture   Final    NO GROWTH 4  DAYS Performed at [facility], [place] [address], [place], [place] [address]    Report Status PENDING  Incomplete  Culture, blood (routine x 2)     Status: None (Preliminary result)   Collection Time: 11/03/19  1:59 PM   Specimen: BLOOD  Result Value Ref Range Status   Specimen Description   Final    BLOOD LEFT ANTECUBITAL Performed at [facility] [address], [place], [place] [address]    Special Requests   Final    BOTTLES DRAWN AEROBIC AND ANAEROBIC Blood Culture results may not be optimal due to an inadequate volume of blood received in culture bottles Performed at [facility] [address], [place], [place] [address]    Culture   Final    NO GROWTH 4 DAYS Performed at [facility], [place] [address] [place],  [address]    Report Status PENDING  Incomplete     Labs: BNP (last 3 results) No results for input(s): BNP in the last 8760 hours. Basic Metabolic Panel: Recent Labs  Lab 11/04/19 0452 11/04/19 1908 11/05/19 0232 11/06/19 0423 11/07/19 0355  NA 147* 140 139 133* 136  K 3.0* 3.5 3.6 3.6 4.4  CL 116* 105 104 99 106  CO2 26 29 27 26 25   GLUCOSE 142* 174* 137* 175* 101*  BUN 31* 26* 22 24* 17  CREATININE 0.54 0.81 0.59 0.68 0.60  CALCIUM 8.1* 8.9 8.6* 8.7* 8.4*  MG 2.0  --  1.9 2.0 1.7  PHOS 1.3*  --  2.0* 2.2* 2.1*   Liver Function Tests: Recent Labs  Lab 11/03/19 1207  AST 21  ALT 20  ALKPHOS 66  BILITOT 1.5*  PROT 7.0  ALBUMIN 3.7   Recent Labs  Lab 11/03/19 1713  LIPASE 17   No results for input(s): AMMONIA in the last 168 hours. CBC: Recent Labs  Lab 11/03/19 1207 11/04/19 0452 11/05/19 0232 11/06/19 0423 11/07/19 0355  WBC 24.0* 21.9* 22.6* 16.7* 11.9*  NEUTROABS 21.9*  --   --   --   --   HGB 15.9* 12.7 13.5 12.4 11.5*  HCT 47.9* 38.3 39.5 37.2 34.2*  MCV 99.6 101.6* 99.2 97.9 98.3  PLT 202 152 137* 133* 120*   Cardiac Enzymes: Recent Labs  Lab 11/06/19 1017  11/07/19 0355  CKTOTAL 525* 106   BNP: Invalid input(s): POCBNP CBG: Recent Labs  Lab 11/06/19 1120 11/06/19 1606 11/06/19 2206 11/07/19 0752 11/07/19 1108  GLUCAP 171* 151* 127* 95 162*   D-Dimer No results for input(s): DDIMER in the last 72 hours. Hgb A1c No results for input(s):  HGBA1C in the last 72 hours. Lipid Profile No results for input(s): CHOL, HDL, LDLCALC, TRIG, CHOLHDL, LDLDIRECT in the last 72 hours. Thyroid function studies No results for input(s): TSH, T4TOTAL, T3FREE, THYROIDAB in the last 72 hours.  Invalid input(s): FREET3 Anemia work up No results for input(s): VITAMINB12, FOLATE, FERRITIN, TIBC, IRON, RETICCTPCT in the last 72 hours. Urinalysis    Component Value Date/Time   COLORURINE YELLOW 11/03/2019 1207   APPEARANCEUR CLEAR 11/03/2019 1207   LABSPEC 1.028 11/03/2019 1207   PHURINE 5.0 11/03/2019 1207   GLUCOSEU >=500 (A) 11/03/2019 1207   HGBUR NEGATIVE 11/03/2019 [place] 11/03/2019 1207   BILIRUBINUR Negative 02/12/2019 1019   KETONESUR 20 (A) 11/03/2019 1207   PROTEINUR 30 (A) 11/03/2019 1207   UROBILINOGEN 1.0 02/12/2019 1019   UROBILINOGEN 0.2 05/26/2012 1113   NITRITE NEGATIVE 11/03/2019 1207   LEUKOCYTESUR SMALL (A) 11/03/2019 1207   Sepsis Labs Invalid input(s): PROCALCITONIN,  WBC,  LACTICIDVEN Microbiology Recent Results (from the past 240 hour(s))  Culture, blood (routine x 2)     Status: None (Preliminary result)   Collection Time: 11/03/19  1:58 PM   Specimen: BLOOD  Result Value Ref Range Status   Specimen Description   Final    BLOOD LEFT WRIST Performed at [facility] [address] [place], [place] [address]    Special Requests   Final    BOTTLES DRAWN AEROBIC AND ANAEROBIC Blood Culture adequate volume Performed at [facility] [address] [place], [place] [address]    Culture   Final    NO GROWTH 4 DAYS Performed at [facility],  [place] [address], [place], [place] [address]    Report Status PENDING  Incomplete  Culture, blood (routine x 2)     Status: None (Preliminary result)   Collection Time: 11/03/19  1:59 PM   Specimen: BLOOD  Result Value Ref Range Status   Specimen Description   Final    BLOOD LEFT ANTECUBITAL Performed at [facility] [address], [place], [place] [address]    Special Requests   Final    BOTTLES DRAWN AEROBIC AND ANAEROBIC Blood Culture results may not be optimal due to an inadequate volume of blood received in culture bottles Performed at [facility] [address] [place], [place] [address]    Culture   Final    NO GROWTH 4 DAYS Performed at [facility], [place] [address] [place], [place] [address]    Report Status PENDING  Incomplete     Time coordinating discharge: Over 30 minutes  SIGNED:   Lucky Cowboy, MD  [facility] 11/07/2019, 2:29 PM  If 7PM-7AM, please contact night-coverage

## 2019-11-07 NOTE — Progress Notes (Signed)
Daughter Lanell Matar updated with plan of care.  Report given to Clarise Cruz, Therapist, sports.

## 2019-11-07 NOTE — Discharge Instructions (Signed)
COVID-19 COVID-19 is a respiratory infection that is caused by a virus called severe acute respiratory syndrome coronavirus 2 (SARS-CoV-2). The disease is also known as coronavirus disease or novel coronavirus. In some people, the virus may not cause any symptoms. In others, it may cause a serious infection. The infection can get worse quickly and can lead to complications, such as:  Pneumonia, or infection of the lungs.  Acute respiratory distress syndrome or ARDS. This is a condition in which fluid build-up in the lungs prevents the lungs from filling with air and passing oxygen into the blood.  Acute respiratory failure. This is a condition in which there is not enough oxygen passing from the lungs to the body or when carbon dioxide is not passing from the lungs out of the body.  Sepsis or septic shock. This is a serious bodily reaction to an infection.  Blood clotting problems.  Secondary infections due to bacteria or fungus.  Organ failure. This is when your body's organs stop working. The virus that causes COVID-19 is contagious. This means that it can spread from person to person through droplets from coughs and sneezes (respiratory secretions). What are the causes? This illness is caused by a virus. You may catch the virus by:  Breathing in droplets from an infected person. Droplets can be spread by a person breathing, speaking, singing, coughing, or sneezing.  Touching something, like a table or a doorknob, that was exposed to the virus (contaminated) and then touching your mouth, nose, or eyes. What increases the risk? Risk for infection You are more likely to be infected with this virus if you:  Are within 6 feet (2 meters) of a person with COVID-19.  Provide care for or live with a person who is infected with COVID-19.  Spend time in crowded indoor spaces or live in shared housing. Risk for serious illness You are more likely to become seriously ill from the virus if you:   Are 50 years of age or older. The higher your age, the more you are at risk for serious illness.  Live in a nursing home or long-term care facility.  Have cancer.  Have a long-term (chronic) disease such as: ? Chronic lung disease, including chronic obstructive pulmonary disease or asthma. ? A long-term disease that lowers your body's ability to fight infection (immunocompromised). ? Heart disease, including heart failure, a condition in which the arteries that lead to the heart become narrow or blocked (coronary artery disease), a disease which makes the heart muscle thick, weak, or stiff (cardiomyopathy). ? Diabetes. ? Chronic kidney disease. ? Sickle cell disease, a condition in which red blood cells have an abnormal "sickle" shape. ? Liver disease.  Are obese. What are the signs or symptoms? Symptoms of this condition can range from mild to severe. Symptoms may appear any time from 2 to 14 days after being exposed to the virus. They include:  A fever or chills.  A cough.  Difficulty breathing.  Headaches, body aches, or muscle aches.  Runny or stuffy (congested) nose.  A sore throat.  New loss of taste or smell. Some people may also have stomach problems, such as nausea, vomiting, or diarrhea. Other people may not have any symptoms of COVID-19. How is this diagnosed? This condition may be diagnosed based on:  Your signs and symptoms, especially if: ? You live in an area with a COVID-19 outbreak. ? You recently traveled to or from an area where the virus is common. ? You   provide care for or live with a person who was diagnosed with COVID-19. ? You were exposed to a person who was diagnosed with COVID-19.  A physical exam.  Lab tests, which may include: ? Taking a sample of fluid from the back of your nose and throat (nasopharyngeal fluid), your nose, or your throat using a swab. ? A sample of mucus from your lungs (sputum). ? Blood tests.  Imaging tests, which  may include, X-rays, CT scan, or ultrasound. How is this treated? At present, there is no medicine to treat COVID-19. Medicines that treat other diseases are being used on a trial basis to see if they are effective against COVID-19. Your health care provider will talk with you about ways to treat your symptoms. For most people, the infection is mild and can be managed at home with rest, fluids, and over-the-counter medicines. Treatment for a serious infection usually takes places in a hospital intensive care unit (ICU). It may include one or more of the following treatments. These treatments are given until your symptoms improve.  Receiving fluids and medicines through an IV.  Supplemental oxygen. Extra oxygen is given through a tube in the nose, a face mask, or a hood.  Positioning you to lie on your stomach (prone position). This makes it easier for oxygen to get into the lungs.  Continuous positive airway pressure (CPAP) or bi-level positive airway pressure (BPAP) machine. This treatment uses mild air pressure to keep the airways open. A tube that is connected to a motor delivers oxygen to the body.  Ventilator. This treatment moves air into and out of the lungs by using a tube that is placed in your windpipe.  Tracheostomy. This is a procedure to create a hole in the neck so that a breathing tube can be inserted.  Extracorporeal membrane oxygenation (ECMO). This procedure gives the lungs a chance to recover by taking over the functions of the heart and lungs. It supplies oxygen to the body and removes carbon dioxide. Follow these instructions at home: Lifestyle  If you are sick, stay home except to get medical care. Your health care provider will tell you how long to stay home. Call your health care provider before you go for medical care.  Rest at home as told by your health care provider.  Do not use any products that contain nicotine or tobacco, such as cigarettes, e-cigarettes, and  chewing tobacco. If you need help quitting, ask your health care provider.  Return to your normal activities as told by your health care provider. Ask your health care provider what activities are safe for you. General instructions  Take over-the-counter and prescription medicines only as told by your health care provider.  Drink enough fluid to keep your urine pale yellow.  Keep all follow-up visits as told by your health care provider. This is important. How is this prevented?  There is no vaccine to help prevent COVID-19 infection. However, there are steps you can take to protect yourself and others from this virus. To protect yourself:   Do not travel to areas where COVID-19 is a risk. The areas where COVID-19 is reported change often. To identify high-risk areas and travel restrictions, check the CDC travel website: wwwnc.cdc.gov/travel/notices  If you live in, or must travel to, an area where COVID-19 is a risk, take precautions to avoid infection. ? Stay away from people who are sick. ? Wash your hands often with soap and water for 20 seconds. If soap and water   are not available, use an alcohol-based hand sanitizer. ? Avoid touching your mouth, face, eyes, or nose. ? Avoid going out in public, follow guidance from your state and local health authorities. ? If you must go out in public, wear a cloth face covering or face mask. Make sure your mask covers your nose and mouth. ? Avoid crowded indoor spaces. Stay at least 6 feet (2 meters) away from others. ? Disinfect objects and surfaces that are frequently touched every day. This may include:  Counters and tables.  Doorknobs and light switches.  Sinks and faucets.  Electronics, such as phones, remote controls, keyboards, computers, and tablets. To protect others: If you have symptoms of COVID-19, take steps to prevent the virus from spreading to others.  If you think you have a COVID-19 infection, contact your health care  provider right away. Tell your health care team that you think you may have a COVID-19 infection.  Stay home. Leave your house only to seek medical care. Do not use public transport.  Do not travel while you are sick.  Wash your hands often with soap and water for 20 seconds. If soap and water are not available, use alcohol-based hand sanitizer.  Stay away from other members of your household. Let healthy household members care for children and pets, if possible. If you have to care for children or pets, wash your hands often and wear a mask. If possible, stay in your own room, separate from others. Use a different bathroom.  Make sure that all people in your household wash their hands well and often.  Cough or sneeze into a tissue or your sleeve or elbow. Do not cough or sneeze into your hand or into the air.  Wear a cloth face covering or face mask. Make sure your mask covers your nose and mouth. Where to find more information  Centers for Disease Control and Prevention: www.cdc.gov/coronavirus/2019-ncov/index.html  World Health Organization: www.who.int/health-topics/coronavirus Contact a health care provider if:  You live in or have traveled to an area where COVID-19 is a risk and you have symptoms of the infection.  You have had contact with someone who has COVID-19 and you have symptoms of the infection. Get help right away if:  You have trouble breathing.  You have pain or pressure in your chest.  You have confusion.  You have bluish lips and fingernails.  You have difficulty waking from sleep.  You have symptoms that get worse. These symptoms may represent a serious problem that is an emergency. Do not wait to see if the symptoms will go away. Get medical help right away. Call your local emergency services (911 in the [place] Do not drive yourself to the hospital. Let the emergency medical personnel know if you think you have COVID-19. Summary  COVID-19 is a  respiratory infection that is caused by a virus. It is also known as coronavirus disease or novel coronavirus. It can cause serious infections, such as pneumonia, acute respiratory distress syndrome, acute respiratory failure, or sepsis.  The virus that causes COVID-19 is contagious. This means that it can spread from person to person through droplets from breathing, speaking, singing, coughing, or sneezing.  You are more likely to develop a serious illness if you are 50 years of age or older, have a weak immune system, live in a nursing home, or have chronic disease.  There is no medicine to treat COVID-19. Your health care provider will talk with you about ways to treat your symptoms.    Take steps to protect yourself and others from infection. Wash your hands often and disinfect objects and surfaces that are frequently touched every day. Stay away from people who are sick and wear a mask if you are sick. This information is not intended to replace advice given to you by your health care provider. Make sure you discuss any questions you have with your health care provider. Document Revised: 08/06/2019 Document Reviewed: 11/12/2018 Elsevier Patient Education  2020 [employer] Inc.  

## 2019-11-07 NOTE — TOC Transition Note (Signed)
Transition of Care [facility]) - CM/SW Discharge Note   Patient Details  Name: [name] MRN: [id number] Date of Birth: 25-Apr-1931  [facility] CM/SW Contact:  Servando Snare, LCSW Phone Number: 11/07/2019, 2:57 PM   Clinical Narrative:  Patient will dc home via [facility].  RN please call daughter when patient is picked up.    Final next level of care: [place] Barriers to Discharge: Continued Medical Work up   Patient Goals and CMS Choice Patient states their goals for this hospitalization and ongoing recovery are:: Per daughter she would like patient to return back home with home health services. CMS Medicare.gov Compare Post Acute Care list provided to:: Patient Represenative (must comment) Choice offered to / list presented to : Adult Children  Discharge Placement                       Discharge Plan and Services In-house Referral: Clinical Social Work   Post Acute Care Choice: Home Health          DME Arranged: N/A         HH Arranged: RN, PT, OT HH Agency: [facility] Date [place]: 11/05/19 Time [place]: 1726 Representative spoke with at [place]: Amy  Social Determinants of Health ([place]) Interventions     Readmission Risk Interventions No flowsheet data found.

## 2019-11-08 ENCOUNTER — Telehealth: Payer: Self-pay

## 2019-11-08 DIAGNOSIS — F0280 Dementia in other diseases classified elsewhere without behavioral disturbance: Secondary | ICD-10-CM | POA: Diagnosis not present

## 2019-11-08 DIAGNOSIS — E1165 Type 2 diabetes mellitus with hyperglycemia: Secondary | ICD-10-CM | POA: Diagnosis not present

## 2019-11-08 DIAGNOSIS — I10 Essential (primary) hypertension: Secondary | ICD-10-CM | POA: Diagnosis not present

## 2019-11-08 DIAGNOSIS — I2510 Atherosclerotic heart disease of native coronary artery without angina pectoris: Secondary | ICD-10-CM | POA: Diagnosis not present

## 2019-11-08 DIAGNOSIS — Z86718 Personal history of other venous thrombosis and embolism: Secondary | ICD-10-CM | POA: Diagnosis not present

## 2019-11-08 DIAGNOSIS — M810 Age-related osteoporosis without current pathological fracture: Secondary | ICD-10-CM | POA: Diagnosis not present

## 2019-11-08 DIAGNOSIS — I739 Peripheral vascular disease, unspecified: Secondary | ICD-10-CM | POA: Diagnosis not present

## 2019-11-08 DIAGNOSIS — M6281 Muscle weakness (generalized): Secondary | ICD-10-CM | POA: Diagnosis not present

## 2019-11-08 DIAGNOSIS — E46 Unspecified protein-calorie malnutrition: Secondary | ICD-10-CM | POA: Diagnosis not present

## 2019-11-08 DIAGNOSIS — J45909 Unspecified asthma, uncomplicated: Secondary | ICD-10-CM | POA: Diagnosis not present

## 2019-11-08 DIAGNOSIS — G309 Alzheimer's disease, unspecified: Secondary | ICD-10-CM | POA: Diagnosis not present

## 2019-11-08 DIAGNOSIS — U071 COVID-19: Secondary | ICD-10-CM | POA: Diagnosis not present

## 2019-11-08 DIAGNOSIS — Z7984 Long term (current) use of oral hypoglycemic drugs: Secondary | ICD-10-CM | POA: Diagnosis not present

## 2019-11-08 DIAGNOSIS — S3091XD Unspecified superficial injury of lower back and pelvis, subsequent encounter: Secondary | ICD-10-CM | POA: Diagnosis not present

## 2019-11-08 DIAGNOSIS — E1151 Type 2 diabetes mellitus with diabetic peripheral angiopathy without gangrene: Secondary | ICD-10-CM | POA: Diagnosis not present

## 2019-11-08 LAB — CULTURE, BLOOD (ROUTINE X 2)
Culture: NO GROWTH
Culture: NO GROWTH
Special Requests: ADEQUATE

## 2019-11-08 NOTE — Telephone Encounter (Signed)
Daughter can be reached at [phone]

## 2019-11-08 NOTE — Telephone Encounter (Signed)
Patient discharged from the hospital. Daughter stated that the patient needs to be seen within the next 2 weeks. Next available appt is 12/06/19 at 240pm. Patient is requesting a c/b so patient can be seen before 12/06/19. Daughter declined the 12/06/19 appt.

## 2019-11-09 ENCOUNTER — Telehealth: Payer: Self-pay

## 2019-11-09 DIAGNOSIS — F0280 Dementia in other diseases classified elsewhere without behavioral disturbance: Secondary | ICD-10-CM | POA: Diagnosis not present

## 2019-11-09 DIAGNOSIS — U071 COVID-19: Secondary | ICD-10-CM | POA: Diagnosis not present

## 2019-11-09 DIAGNOSIS — E1165 Type 2 diabetes mellitus with hyperglycemia: Secondary | ICD-10-CM | POA: Diagnosis not present

## 2019-11-09 DIAGNOSIS — G309 Alzheimer's disease, unspecified: Secondary | ICD-10-CM | POA: Diagnosis not present

## 2019-11-09 DIAGNOSIS — S3091XD Unspecified superficial injury of lower back and pelvis, subsequent encounter: Secondary | ICD-10-CM | POA: Diagnosis not present

## 2019-11-09 DIAGNOSIS — M6281 Muscle weakness (generalized): Secondary | ICD-10-CM | POA: Diagnosis not present

## 2019-11-09 NOTE — Telephone Encounter (Signed)
Thanks Courtney-I would forward to seeing patient in addressing concerns

## 2019-11-09 NOTE — Telephone Encounter (Signed)
See phone note from 11/09/19

## 2019-11-09 NOTE — Telephone Encounter (Signed)
Dorian Pod from home health is requesting a nurse to c/b regarding this pt. Dorian Pod can be reached at [phone].

## 2019-11-09 NOTE — Telephone Encounter (Signed)
Received request for verbal orders to apply foam to coccyx as well as b/l heels for comfort. And speech therapy evaluation. Verbals were given for both. Will call if any issues.

## 2019-11-09 NOTE — Telephone Encounter (Signed)
Transition Care Management Follow-up Telephone Call  Date of discharge and from where: [name] 11/07/19  How have you been since you were released from the hospital? Stable  Any questions or concerns? Yes ; daughter concerned that patient seems more lethargic than her baseline.  Per daughter patient has 24 hour care at home.  She is not talking as much and when she does she is not making sense. Daughter will be available during visit.   Items Reviewed:  Did the pt receive and understand the discharge instructions provided? Yes   Medications obtained and verified? Yes   Any new allergies since your discharge? No   Dietary orders reviewed? Yes  Do you have support at home? Yes   Other (ie: DME, Home Health, etc) current home health services with [facility]  Functional Questionnaire: (I = Independent and D = Dependent) ADL's: Dependent   Bathing/Dressing- Dependent   Meal Prep- Dependent  Eating- Dependent  Maintaining continence- Dependent  Transferring/Ambulation- Dependent  Managing Meds- Dependent    Follow up appointments reviewed:    PCP Hospital f/u appt confirmed? Yes  Scheduled to see Dr Yong Channel on 11/11/19 @ 940 (virtual visit).  [facility] f/u appt confirmed? n/a    Are transportation arrangements needed? No   If their condition worsens, is the pt aware to call  their PCP or go to the ED? Yes  Was the patient provided with contact information for the PCP's office or ED? Yes  Was the pt encouraged to call back with questions or concerns? Yes

## 2019-11-09 NOTE — Telephone Encounter (Signed)
Please Advise

## 2019-11-11 ENCOUNTER — Encounter: Payer: Self-pay | Admitting: Family Medicine

## 2019-11-11 ENCOUNTER — Other Ambulatory Visit: Payer: Self-pay

## 2019-11-11 ENCOUNTER — Ambulatory Visit (INDEPENDENT_AMBULATORY_CARE_PROVIDER_SITE_OTHER): Payer: Medicare Other | Admitting: Family Medicine

## 2019-11-11 VITALS — BP 125/65 | HR 69 | Temp 97.6°F | Ht 64.0 in | Wt 129.0 lb

## 2019-11-11 DIAGNOSIS — U071 COVID-19: Secondary | ICD-10-CM

## 2019-11-11 DIAGNOSIS — F0280 Dementia in other diseases classified elsewhere without behavioral disturbance: Secondary | ICD-10-CM

## 2019-11-11 DIAGNOSIS — R5381 Other malaise: Secondary | ICD-10-CM | POA: Diagnosis not present

## 2019-11-11 DIAGNOSIS — E1159 Type 2 diabetes mellitus with other circulatory complications: Secondary | ICD-10-CM

## 2019-11-11 DIAGNOSIS — E11621 Type 2 diabetes mellitus with foot ulcer: Secondary | ICD-10-CM | POA: Diagnosis not present

## 2019-11-11 DIAGNOSIS — J4540 Moderate persistent asthma, uncomplicated: Secondary | ICD-10-CM | POA: Diagnosis not present

## 2019-11-11 DIAGNOSIS — I10 Essential (primary) hypertension: Secondary | ICD-10-CM

## 2019-11-11 DIAGNOSIS — L89312 Pressure ulcer of right buttock, stage 2: Secondary | ICD-10-CM

## 2019-11-11 DIAGNOSIS — L97509 Non-pressure chronic ulcer of other part of unspecified foot with unspecified severity: Secondary | ICD-10-CM

## 2019-11-11 DIAGNOSIS — G309 Alzheimer's disease, unspecified: Secondary | ICD-10-CM | POA: Diagnosis not present

## 2019-11-11 NOTE — Patient Instructions (Signed)
Health Maintenance Due  Topic Date Due  . OPHTHALMOLOGY EXAM  05/19/2019  . FOOT EXAM  07/25/2019    Depression screen [facility] 2/9 02/11/2019 09/03/2017 06/17/2017  Decreased Interest 0 0 0  Down, Depressed, Hopeless 0 0 0  PHQ - 2 Score 0 0 0  Some recent data might be hidden    Recommended follow up: No follow-ups on file.

## 2019-11-11 NOTE — Assessment & Plan Note (Addendum)
Patient had significant debility which started honestly a few days after she was discharged from COVID-19 hospitalization-initially seemed to do well then symptoms worsened.  It is unclear at this point how much of a "rebound" or recovery patient will have after this hospitalization.  We considered a palliative care consult at this point but ultimately decided to see how she was doing by 6-week follow-up and refer at that time if not improving.  I am not sure she would qualify for hospice at this time-I think we need more information/time to see how she recovers.  Asthma With how she is spending most of her time laying I wonder if this is contributing to atelectasis.  She is having some congestion and I encouraged incentive spirometer use.  They may purchase another as they threw away the first.  She does have underlying asthma and will continue Pulmicort twice a day with albuterol as needed.

## 2019-11-11 NOTE — Assessment & Plan Note (Signed)
Previously Off all BP meds- used to be on metoprolol.  Amlodipine 10 mg restarted January 2021 hospitalization.  Blood pressure well controlled today and we will continue current medication-I suspect if she gets more mobile we may have to decrease dose due to orthostatic concerns and fall risk-family will let me know if any issues with blood pressure getting lower

## 2019-11-11 NOTE — Assessment & Plan Note (Addendum)
Patient with significant hyperglycemia on Decadron.  In September 2019 we had previously stopped Metformin due to weight loss.  She was restarted on Metformin 500 mg twice a day and that has been helpful.  She does seem to have low appetite.  I recommended we reduce the dose to 500 mg once a day-family can alert me if blood sugar significantly elevated.  We will do a follow-up in 6 weeks to recheck how she is doing.

## 2019-11-11 NOTE — Assessment & Plan Note (Signed)
Patient's mental status has not returned to baseline from before hospitalizations.  Seems more sedated overall.  She continues Namenda and Aricept for now.  Last visit with neurology was February 10, 2019 I believe.  We will continue to monitor-continue current medications for now

## 2019-11-11 NOTE — Assessment & Plan Note (Addendum)
Patient at high risk for pressure ulcer due to nutrition status-family is going to try to continue to encourage p.o. intake.  Family reports she was sent home with decubitus ulcer-I am unsure of the size of this based off video examination.  Regardless family asks if they could possibly use a gel overlay for for bed or what other options might be available for the chair she is in-she seems to complain of pain when she is in the chair.  I told family I would somewhat discouraged chair use as it sounds like positioning is more challenging and may be putting more direct pressure on the area.  We will place home health referral for pressure ulcer management/wound care.  We will ask for opinion on gel overlay-happy to sign for this if needed or any other options for the chair she sits in.  We are also requesting physical therapy and Occupational Therapy.  Also requesting blood work for CBC, CMP under hypertension and phosphorus under phosphorus deficiency.

## 2019-11-11 NOTE — Progress Notes (Signed)
Phone [phone] Virtual visit via Video note TCM     Subjective:  [name] is a 86 y.o. year old very pleasant female patient who presents for transitional care management and hospital follow up for diabetes with hyperglycemia after being placed on Decadron for COVID-19 and discharged home. Patient was hospitalized from November 03, 2019 to November 07, 2019. A TCM phone call was completed on November 09, 2019. Medical complexity moderate  This visit type was conducted due to national recommendations for restrictions regarding the COVID-19 Pandemic (e.g. social distancing).  This format is felt to be most appropriate for this patient at this time balancing risks to patient and risks to population by having him in for in person visit.  No physical exam was performed (except for noted visual exam or audio findings with Telehealth visits).    Our team/I connected with [name] at  9:40 AM EST by a video enabled telemedicine application (doxy.me or caregility through epic) and verified that I am speaking with the correct person using two identifiers.  Location patient: [facility] Location provider: [facility], office Persons participating in the virtual visit:  Patient, daughter, caregiver stacey  Our team/I discussed the limitations of evaluation and management by telemedicine and the availability of in person appointments. In light of current covid-19 pandemic, patient also understands that we are trying to protect them by minimizing in office contact if at all possible.  The patient expressed consent for telemedicine visit and agreed to proceed. Patient understands insurance will be billed.   Review of hospital discharge:   Patient had recently been hospitalized for COVID-19 along with potential UTI-though this was not verified on culture-who presented again to the hospital with blood sugars over 500 in the setting of Decadron for COVID-19 associated with significant lethargy.  Patient had  a leukocytosis to 24,000 which was also likely associated with Decadron.  Blood pressure was elevated on admission.  CT head and CT abdomen pelvis showed no signs of acute abnormality to account for lethargy.  Chest x-ray without pneumonia.  She received 2 L of IV fluids in the emergency room for dehydration.  Mild LFT elevation noted at 1.5 on January 13 but otherwise LFTs were normal.  Did have slightly elevated CK initially but trended down with hydration.  Blood cultures were drawn but were negative.  Not explicitly stated in discharge summary but it would appear patient's high blood sugars treated with hydration, sliding scale insulin but ultimately patient was discharged home on Metformin 500 mg twice a day.  Last 2 blood sugars on day of discharge were 95 and 162.  Patient did have significant hypertension and amlodipine 10 mg was added to her regimen. Patient initially with some hyponatremia at 147 likely associated with dehydration as well as low potassium at 3.0 on November 04, 2019.  With appropriate hydration these issues resolved and patient had a sodium of 136 before discharge and potassium of 4.4.  White blood cell count trended down to 11.9 thousand before discharge.  Had some anemia at discharge which may have been dilutional down to 11.5.  Of note-patient continued on COVID-19 precautions as within 3 weeks of symptom onset from COVID-19.  She did not show signs of worsening from a pulmonary perspective and remains on no oxygen.  There was concern that her debility and deconditioning was from recent COVID-19 infection along with baseline dementia and medical comorbidities  Current updates:  Patient per family had rough time in the hospital. She is sleeping  a lot and not opening eyes much. Family is having to feed her- other than with ice cream last night had a great conversation and was very clear after feeding dinner had been very difficult.   On Metformin blood sugars have been  controlled.  Blood sugar was 97 this morning on Metformin 500 mg twice daily.  She has completed Decadron at this point so less likely for blood sugars to spike.  I wonder if Metformin could be suppressing appetite  She is tolerating amlodipine 10 mg and blood pressure is well controlled-as she becomes more mobile we may need to consider decreasing this dose if any balance issues.  Had first session with PT on Tuesday- starting next week they think with regular therapy.  From a breathing perspective-Does breathing treatment morning and night. Seems congested. Recommended incentive spirometer.   Decubitus ulcer when came home from hospital. They have tried to rotate her.  They are not completely sure on the size of this.  Patient is not able to go to the bathroom so she is using an adult diaper.  Family is try to use a moisture barrier cream or silicone gel foam on decubitus ulcer.  They have recently run out of silicone gel foam.  Family is certainly hopeful to see some improvement from debility but if not improving by follow-up they were agreeable to considering palliative care consult.   See problem oriented charting as well  Past Medical History-  Patient Active Problem List   Diagnosis Date Noted  . COVID-19 virus infection 10/23/2019    Priority: High  . History of DVT in adulthood 06/26/2017    Priority: High  . Coronary artery calcification seen on CAT scan 08/28/2016    Priority: High  . Alzheimer's disease ([place]) 01/04/2016    Priority: High  . Diabetes ([place]) 12/18/2006    Priority: High  . Atherosclerosis of native arteries of extremity with intermittent claudication ([place]) 12/18/2006    Priority: High  . Hyperlipidemia associated with type 2 diabetes mellitus ([place]) 10/30/2019    Priority: Medium  . Osteoporosis 07/02/2018    Priority: Medium  . Generalized weakness 05/02/2018    Priority: Medium  . Pancreas cyst 07/02/2015    Priority: Medium  . Asthma, chronic  03/16/2015    Priority: Medium  . Primary open angle glaucoma of left eye, moderate stage 12/25/2014    Priority: Medium  . Hypertension associated with diabetes ([place]) 12/18/2006    Priority: Medium  . Aortic atherosclerosis ([place]) 10/30/2019    Priority: Low  . Bradycardia with 51-60 beats per minute 10/24/2019    Priority: Low  . Protein-calorie malnutrition ([place]) 07/21/2018    Priority: Low  . GERD (gastroesophageal reflux disease) 05/02/2018    Priority: Low  . LBBB (left bundle branch block) 10/09/2016    Priority: Low  . Pleural effusion, left 09/23/2016    Priority: Low  . Pseudophakia of both eyes 02/15/2013    Priority: Low  . Anemia 05/25/2012    Priority: Low  . Astigmatism 04/29/2012    Priority: Low  . Allergic rhinitis 10/30/2010    Priority: Low  . Venous (peripheral) insufficiency 04/13/2007    Priority: Low  . OSTEOARTHRITIS OF SPINE, NOS 12/18/2006    Priority: Low  . Encephalopathy 11/04/2019  . Pressure injury of skin 11/04/2019  . Hyperglycemia 11/03/2019  . Diabetic ulcer of foot with fat layer exposed ([place]) 05/14/2018    Medications- reviewed and updated  A medical reconciliation was performed comparing  current medicines to hospital discharge medications. Current Outpatient Medications  Medication Sig Dispense Refill  . albuterol (PROAIR HFA) 108 (90 Base) MCG/ACT inhaler 2 PUFFS EVERY 6 HOURS AS NEEDED FOR SHORTNESS OF BREATH. (Patient taking differently: Inhale 2 puffs into the lungs every 6 (six) hours as needed for wheezing or shortness of breath. ) 17 g 5  . albuterol (PROVENTIL) (2.5 MG/3ML) 0.083% nebulizer solution USE THREE MILLILITERS VIA NEBULIZATION BY MOUTH EVERY 6 HOURS AS NEEDED FOR WHEEZING OR SHORTNESS OF BREATH (Patient taking differently: Take 2.5 mg by nebulization daily. ) 300 mL 1  . amLODipine (NORVASC) 10 MG tablet Take 1 tablet (10 mg total) by mouth daily. 90 tablet 1  . ascorbic acid (VITAMIN C) 500 MG tablet Take 500 mg by  mouth 2 (two) times daily.    [place] Kitchen aspirin 81 MG tablet Take 81 mg by mouth daily.    . brimonidine (ALPHAGAN P) 0.1 % SOLN Place 1 drop into both eyes 2 (two) times daily.     . budesonide (PULMICORT) 0.25 MG/2ML nebulizer solution Take 2 mLs (0.25 mg total) by nebulization daily. ICD10 Code-J45.909 (Patient taking differently: Take 0.25 mg by nebulization at bedtime. ICD10 Code-J45.909) 60 mL 12  . calcium carbonate (OS-CAL) 600 MG TABS Take 600 mg by mouth 2 (two) times daily with a meal.    . citalopram (CELEXA) 10 MG tablet TAKE 1 TABLET ONCE DAILY. (Patient taking differently: Take 10 mg by mouth daily. ) 90 tablet 0  . donepezil (ARICEPT) 10 MG tablet TAKE ONE TABLET AT BEDTIME. (Patient taking differently: Take 10 mg by mouth at bedtime. ) 90 tablet 3  . famotidine (PEPCID) 20 MG tablet TAKE ONE TABLET TWICE A DAY AS NEEDED FOR HEARTBURN OR INDIGESTION. (Patient taking differently: Take 20 mg by mouth daily. ) 120 tablet 0  . ferrous sulfate 325 (65 FE) MG EC tablet Take 325 mg by mouth daily with breakfast.     . memantine (NAMENDA) 10 MG tablet TAKE 1 TABLET TWICE DAILY FOR MEMORY. (Patient taking differently: Take 10 mg by mouth 2 (two) times daily. ) 180 tablet 3  . metFORMIN (GLUCOPHAGE) 500 MG tablet Take 1 tablet (500 mg total) by mouth 2 (two) times daily with a meal. 180 tablet 1  . Multiple Vitamin (MULTIVITAMIN WITH MINERALS) TABS tablet Take 1 tablet by mouth daily.    . phosphorus (K PHOS NEUTRAL) [id number] MG tablet Take 1 tablet (250 mg total) by mouth daily. 30 tablet 0  . dexamethasone (DECADRON) 4 MG tablet     . senna-docusate (SENOKOT-S) 8.6-50 MG tablet Take 1 tablet by mouth 2 (two) times daily between meals as needed for mild constipation. (Patient not taking: Reported on 11/11/2019) 60 tablet 0   No current facility-administered medications for this visit.   Objective  Objective:  BP 125/65   Pulse 69   Temp 97.6 F (36.4 C)   Ht 5\' 4"  (1.626 m)   Wt 129 lb  (58.5 kg)   LMP  (LMP Unknown)   SpO2 94%   BMI 22.14 kg/m  Gen: NAD, resting comfortably in her bed   Assessment and Plan:   Diabetes ([place]) Patient with significant hyperglycemia on Decadron.  In September 2019 we had previously stopped Metformin due to weight loss.  She was restarted on Metformin 500 mg twice a day and that has been helpful.  She does seem to have low appetite.  I recommended we reduce the dose to 500 mg once a  day-family can alert me if blood sugar significantly elevated.  We will do a follow-up in 6 weeks to recheck how she is doing.  COVID-19 virus infection Patient had significant debility which started honestly a few days after she was discharged from COVID-19 hospitalization-initially seemed to do well then symptoms worsened.  It is unclear at this point how much of a "rebound" or recovery patient will have after this hospitalization.  We considered a palliative care consult at this point but ultimately decided to see how she was doing by 6-week follow-up and refer at that time if not improving.  I am not sure she would qualify for hospice at this time-I think we need more information/time to see how she recovers.  Asthma With how she is spending most of her time laying I wonder if this is contributing to atelectasis.  She is having some congestion and I encouraged incentive spirometer use.  They may purchase another as they threw away the first.  She does have underlying asthma and will continue Pulmicort twice a day with albuterol as needed.  Hypertension associated with diabetes ([place]) Previously Off all BP meds- used to be on metoprolol.  Amlodipine 10 mg restarted January 2021 hospitalization.  Blood pressure well controlled today and we will continue current medication-I suspect if she gets more mobile we may have to decrease dose due to orthostatic concerns and fall risk-family will let me know if any issues with blood pressure getting lower   Pressure injury of  skin Patient at high risk for pressure ulcer due to nutrition status-family is going to try to continue to encourage p.o. intake.  Family reports she was sent home with decubitus ulcer-I am unsure of the size of this based off video examination.  Regardless family asks if they could possibly use a gel overlay for for bed or what other options might be available for the chair she is in-she seems to complain of pain when she is in the chair.  I told family I would somewhat discouraged chair use as it sounds like positioning is more challenging and may be putting more direct pressure on the area.  We will place home health referral for pressure ulcer management/wound care.  We will ask for opinion on gel overlay-happy to sign for this if needed or any other options for the chair she sits in.  We are also requesting physical therapy and Occupational Therapy.  Also requesting blood work for CBC, CMP under hypertension and phosphorus under phosphorus deficiency.  Alzheimer's disease ([place]) Patient's mental status has not returned to baseline from before hospitalizations.  Seems more sedated overall.  She continues Namenda and Aricept for now.  Last visit with neurology was February 10, 2019 I believe.  We will continue to monitor-continue current medications for now  Recommended follow up: 6-week follow-up  Lab/Order associations:   ICD-10-CM   1. Moderate persistent chronic asthma without complication  123456   2. Type 2 diabetes mellitus with foot ulcer, without long-term current use of insulin ([place])  E11.621    L97.509   3. COVID-19 virus infection  U07.1 Ambulatory referral to [place]  4. Hypertension associated with diabetes ([place])  E11.59    I10   5. Pressure injury of right buttock, stage 2 ([place])  L89.312 Ambulatory referral to Home Health  6. Debility  R53.81 Ambulatory referral to Home Health  7. Alzheimer's dementia without behavioral disturbance, unspecified timing of dementia onset ([place])   G30.9    F02.80     -  Reduce Metformin to 500 mg once a day verbally requested-have not changed medication list yet.  Return precautions advised.  Garret Reddish, MD

## 2019-11-12 ENCOUNTER — Telehealth: Payer: Self-pay | Admitting: Family Medicine

## 2019-11-12 DIAGNOSIS — S3091XD Unspecified superficial injury of lower back and pelvis, subsequent encounter: Secondary | ICD-10-CM | POA: Diagnosis not present

## 2019-11-12 DIAGNOSIS — U071 COVID-19: Secondary | ICD-10-CM | POA: Diagnosis not present

## 2019-11-12 DIAGNOSIS — M6281 Muscle weakness (generalized): Secondary | ICD-10-CM | POA: Diagnosis not present

## 2019-11-12 DIAGNOSIS — F0280 Dementia in other diseases classified elsewhere without behavioral disturbance: Secondary | ICD-10-CM | POA: Diagnosis not present

## 2019-11-12 DIAGNOSIS — E1165 Type 2 diabetes mellitus with hyperglycemia: Secondary | ICD-10-CM | POA: Diagnosis not present

## 2019-11-12 DIAGNOSIS — G309 Alzheimer's disease, unspecified: Secondary | ICD-10-CM | POA: Diagnosis not present

## 2019-11-12 NOTE — Telephone Encounter (Signed)
Noted  

## 2019-11-12 NOTE — Telephone Encounter (Signed)
Occupational Therapist calling for verbal order from [facility]. OT is requesting patient receive therapy twice a week for 4 weeks. Please advise.

## 2019-11-12 NOTE — Telephone Encounter (Signed)
Sandy from [facility] was calling just to let  Dr. Yong Channel know they had to move the patients Eval out to next week.

## 2019-11-15 NOTE — Telephone Encounter (Signed)
Called and left VO on [place] VM.

## 2019-11-16 DIAGNOSIS — E1165 Type 2 diabetes mellitus with hyperglycemia: Secondary | ICD-10-CM | POA: Diagnosis not present

## 2019-11-16 DIAGNOSIS — F0280 Dementia in other diseases classified elsewhere without behavioral disturbance: Secondary | ICD-10-CM | POA: Diagnosis not present

## 2019-11-16 DIAGNOSIS — G309 Alzheimer's disease, unspecified: Secondary | ICD-10-CM | POA: Diagnosis not present

## 2019-11-16 DIAGNOSIS — U071 COVID-19: Secondary | ICD-10-CM | POA: Diagnosis not present

## 2019-11-16 DIAGNOSIS — S3091XD Unspecified superficial injury of lower back and pelvis, subsequent encounter: Secondary | ICD-10-CM | POA: Diagnosis not present

## 2019-11-16 DIAGNOSIS — M6281 Muscle weakness (generalized): Secondary | ICD-10-CM | POA: Diagnosis not present

## 2019-11-17 DIAGNOSIS — F0280 Dementia in other diseases classified elsewhere without behavioral disturbance: Secondary | ICD-10-CM | POA: Diagnosis not present

## 2019-11-17 DIAGNOSIS — G309 Alzheimer's disease, unspecified: Secondary | ICD-10-CM | POA: Diagnosis not present

## 2019-11-17 DIAGNOSIS — S3091XD Unspecified superficial injury of lower back and pelvis, subsequent encounter: Secondary | ICD-10-CM | POA: Diagnosis not present

## 2019-11-17 DIAGNOSIS — M6281 Muscle weakness (generalized): Secondary | ICD-10-CM | POA: Diagnosis not present

## 2019-11-17 DIAGNOSIS — U071 COVID-19: Secondary | ICD-10-CM | POA: Diagnosis not present

## 2019-11-17 DIAGNOSIS — E1165 Type 2 diabetes mellitus with hyperglycemia: Secondary | ICD-10-CM | POA: Diagnosis not present

## 2019-11-18 DIAGNOSIS — S3091XD Unspecified superficial injury of lower back and pelvis, subsequent encounter: Secondary | ICD-10-CM | POA: Diagnosis not present

## 2019-11-18 DIAGNOSIS — U071 COVID-19: Secondary | ICD-10-CM | POA: Diagnosis not present

## 2019-11-18 DIAGNOSIS — G309 Alzheimer's disease, unspecified: Secondary | ICD-10-CM | POA: Diagnosis not present

## 2019-11-18 DIAGNOSIS — E1165 Type 2 diabetes mellitus with hyperglycemia: Secondary | ICD-10-CM | POA: Diagnosis not present

## 2019-11-18 DIAGNOSIS — M6281 Muscle weakness (generalized): Secondary | ICD-10-CM | POA: Diagnosis not present

## 2019-11-18 DIAGNOSIS — F0280 Dementia in other diseases classified elsewhere without behavioral disturbance: Secondary | ICD-10-CM | POA: Diagnosis not present

## 2019-11-18 DIAGNOSIS — I10 Essential (primary) hypertension: Secondary | ICD-10-CM | POA: Diagnosis not present

## 2019-11-18 LAB — COMPREHENSIVE METABOLIC PANEL
Albumin: 3 — AB (ref 3.5–5.0)
Calcium: 9.3 (ref 8.7–10.7)
Globulin: 2.8

## 2019-11-18 LAB — HEPATIC FUNCTION PANEL
ALT: 13 (ref 7–35)
AST: 17 (ref 13–35)
Alkaline Phosphatase: 64 (ref 25–125)

## 2019-11-18 LAB — BASIC METABOLIC PANEL
BUN: 16 (ref 4–21)
Chloride: 100 (ref 99–108)
Creatinine: 0.7 (ref 0.5–1.1)
Glucose: 208
Potassium: 4.3 (ref 3.4–5.3)
Sodium: 138 (ref 137–147)

## 2019-11-18 LAB — CBC AND DIFFERENTIAL
HCT: 35 — AB (ref 36–46)
Hemoglobin: 11.8 — AB (ref 12.0–16.0)
Platelets: 383 (ref 150–399)
WBC: 9.5

## 2019-11-18 LAB — CBC: RBC: 3.55 — AB (ref 3.87–5.11)

## 2019-11-19 DIAGNOSIS — M6281 Muscle weakness (generalized): Secondary | ICD-10-CM | POA: Diagnosis not present

## 2019-11-19 DIAGNOSIS — U071 COVID-19: Secondary | ICD-10-CM | POA: Diagnosis not present

## 2019-11-19 DIAGNOSIS — S3091XD Unspecified superficial injury of lower back and pelvis, subsequent encounter: Secondary | ICD-10-CM | POA: Diagnosis not present

## 2019-11-19 DIAGNOSIS — E1165 Type 2 diabetes mellitus with hyperglycemia: Secondary | ICD-10-CM | POA: Diagnosis not present

## 2019-11-19 DIAGNOSIS — G309 Alzheimer's disease, unspecified: Secondary | ICD-10-CM | POA: Diagnosis not present

## 2019-11-19 DIAGNOSIS — F0280 Dementia in other diseases classified elsewhere without behavioral disturbance: Secondary | ICD-10-CM | POA: Diagnosis not present

## 2019-11-20 DIAGNOSIS — F0280 Dementia in other diseases classified elsewhere without behavioral disturbance: Secondary | ICD-10-CM | POA: Diagnosis not present

## 2019-11-20 DIAGNOSIS — E1165 Type 2 diabetes mellitus with hyperglycemia: Secondary | ICD-10-CM | POA: Diagnosis not present

## 2019-11-20 DIAGNOSIS — S3091XD Unspecified superficial injury of lower back and pelvis, subsequent encounter: Secondary | ICD-10-CM | POA: Diagnosis not present

## 2019-11-20 DIAGNOSIS — G309 Alzheimer's disease, unspecified: Secondary | ICD-10-CM | POA: Diagnosis not present

## 2019-11-20 DIAGNOSIS — M6281 Muscle weakness (generalized): Secondary | ICD-10-CM | POA: Diagnosis not present

## 2019-11-20 DIAGNOSIS — U071 COVID-19: Secondary | ICD-10-CM | POA: Diagnosis not present

## 2019-11-23 ENCOUNTER — Telehealth: Payer: Self-pay | Admitting: Family Medicine

## 2019-11-23 DIAGNOSIS — M6281 Muscle weakness (generalized): Secondary | ICD-10-CM | POA: Diagnosis not present

## 2019-11-23 DIAGNOSIS — F0280 Dementia in other diseases classified elsewhere without behavioral disturbance: Secondary | ICD-10-CM | POA: Diagnosis not present

## 2019-11-23 DIAGNOSIS — U071 COVID-19: Secondary | ICD-10-CM | POA: Diagnosis not present

## 2019-11-23 DIAGNOSIS — E1165 Type 2 diabetes mellitus with hyperglycemia: Secondary | ICD-10-CM | POA: Diagnosis not present

## 2019-11-23 DIAGNOSIS — G309 Alzheimer's disease, unspecified: Secondary | ICD-10-CM | POA: Diagnosis not present

## 2019-11-23 DIAGNOSIS — S3091XD Unspecified superficial injury of lower back and pelvis, subsequent encounter: Secondary | ICD-10-CM | POA: Diagnosis not present

## 2019-11-23 NOTE — Telephone Encounter (Signed)
[facility] just wanted to notify Dr.Hunter that they have not received the wound care supplies.

## 2019-11-23 NOTE — Telephone Encounter (Signed)
Called [name] with [facility] states no issue with orders just will not receive the aquefore in yet. They should have by later in the week when she goes out to home. They are continuing to use old orders to change dressing until then.

## 2019-11-23 NOTE — Telephone Encounter (Signed)
Thanks for update

## 2019-11-25 DIAGNOSIS — F0280 Dementia in other diseases classified elsewhere without behavioral disturbance: Secondary | ICD-10-CM | POA: Diagnosis not present

## 2019-11-25 DIAGNOSIS — E1165 Type 2 diabetes mellitus with hyperglycemia: Secondary | ICD-10-CM | POA: Diagnosis not present

## 2019-11-25 DIAGNOSIS — G309 Alzheimer's disease, unspecified: Secondary | ICD-10-CM | POA: Diagnosis not present

## 2019-11-25 DIAGNOSIS — U071 COVID-19: Secondary | ICD-10-CM | POA: Diagnosis not present

## 2019-11-25 DIAGNOSIS — M6281 Muscle weakness (generalized): Secondary | ICD-10-CM | POA: Diagnosis not present

## 2019-11-25 DIAGNOSIS — S3091XD Unspecified superficial injury of lower back and pelvis, subsequent encounter: Secondary | ICD-10-CM | POA: Diagnosis not present

## 2019-11-29 DIAGNOSIS — S3091XD Unspecified superficial injury of lower back and pelvis, subsequent encounter: Secondary | ICD-10-CM | POA: Diagnosis not present

## 2019-11-29 DIAGNOSIS — G309 Alzheimer's disease, unspecified: Secondary | ICD-10-CM | POA: Diagnosis not present

## 2019-11-29 DIAGNOSIS — E1165 Type 2 diabetes mellitus with hyperglycemia: Secondary | ICD-10-CM | POA: Diagnosis not present

## 2019-11-29 DIAGNOSIS — F0280 Dementia in other diseases classified elsewhere without behavioral disturbance: Secondary | ICD-10-CM | POA: Diagnosis not present

## 2019-11-29 DIAGNOSIS — U071 COVID-19: Secondary | ICD-10-CM | POA: Diagnosis not present

## 2019-11-29 DIAGNOSIS — M6281 Muscle weakness (generalized): Secondary | ICD-10-CM | POA: Diagnosis not present

## 2019-11-30 DIAGNOSIS — G309 Alzheimer's disease, unspecified: Secondary | ICD-10-CM | POA: Diagnosis not present

## 2019-11-30 DIAGNOSIS — U071 COVID-19: Secondary | ICD-10-CM | POA: Diagnosis not present

## 2019-11-30 DIAGNOSIS — S3091XD Unspecified superficial injury of lower back and pelvis, subsequent encounter: Secondary | ICD-10-CM | POA: Diagnosis not present

## 2019-11-30 DIAGNOSIS — M6281 Muscle weakness (generalized): Secondary | ICD-10-CM | POA: Diagnosis not present

## 2019-11-30 DIAGNOSIS — F0280 Dementia in other diseases classified elsewhere without behavioral disturbance: Secondary | ICD-10-CM | POA: Diagnosis not present

## 2019-11-30 DIAGNOSIS — E1165 Type 2 diabetes mellitus with hyperglycemia: Secondary | ICD-10-CM | POA: Diagnosis not present

## 2019-12-01 DIAGNOSIS — G309 Alzheimer's disease, unspecified: Secondary | ICD-10-CM | POA: Diagnosis not present

## 2019-12-01 DIAGNOSIS — M6281 Muscle weakness (generalized): Secondary | ICD-10-CM | POA: Diagnosis not present

## 2019-12-01 DIAGNOSIS — U071 COVID-19: Secondary | ICD-10-CM | POA: Diagnosis not present

## 2019-12-01 DIAGNOSIS — F0280 Dementia in other diseases classified elsewhere without behavioral disturbance: Secondary | ICD-10-CM | POA: Diagnosis not present

## 2019-12-01 DIAGNOSIS — E1165 Type 2 diabetes mellitus with hyperglycemia: Secondary | ICD-10-CM | POA: Diagnosis not present

## 2019-12-01 DIAGNOSIS — S3091XD Unspecified superficial injury of lower back and pelvis, subsequent encounter: Secondary | ICD-10-CM | POA: Diagnosis not present

## 2019-12-02 DIAGNOSIS — E1165 Type 2 diabetes mellitus with hyperglycemia: Secondary | ICD-10-CM | POA: Diagnosis not present

## 2019-12-02 DIAGNOSIS — S3091XD Unspecified superficial injury of lower back and pelvis, subsequent encounter: Secondary | ICD-10-CM | POA: Diagnosis not present

## 2019-12-02 DIAGNOSIS — M6281 Muscle weakness (generalized): Secondary | ICD-10-CM | POA: Diagnosis not present

## 2019-12-02 DIAGNOSIS — U071 COVID-19: Secondary | ICD-10-CM | POA: Diagnosis not present

## 2019-12-02 DIAGNOSIS — F0280 Dementia in other diseases classified elsewhere without behavioral disturbance: Secondary | ICD-10-CM | POA: Diagnosis not present

## 2019-12-02 DIAGNOSIS — G309 Alzheimer's disease, unspecified: Secondary | ICD-10-CM | POA: Diagnosis not present

## 2019-12-03 DIAGNOSIS — U071 COVID-19: Secondary | ICD-10-CM | POA: Diagnosis not present

## 2019-12-03 DIAGNOSIS — G309 Alzheimer's disease, unspecified: Secondary | ICD-10-CM | POA: Diagnosis not present

## 2019-12-03 DIAGNOSIS — M6281 Muscle weakness (generalized): Secondary | ICD-10-CM | POA: Diagnosis not present

## 2019-12-03 DIAGNOSIS — F0280 Dementia in other diseases classified elsewhere without behavioral disturbance: Secondary | ICD-10-CM | POA: Diagnosis not present

## 2019-12-03 DIAGNOSIS — E1165 Type 2 diabetes mellitus with hyperglycemia: Secondary | ICD-10-CM | POA: Diagnosis not present

## 2019-12-03 DIAGNOSIS — S3091XD Unspecified superficial injury of lower back and pelvis, subsequent encounter: Secondary | ICD-10-CM | POA: Diagnosis not present

## 2019-12-06 ENCOUNTER — Other Ambulatory Visit: Payer: Self-pay | Admitting: Family Medicine

## 2019-12-07 ENCOUNTER — Other Ambulatory Visit: Payer: Self-pay | Admitting: Family Medicine

## 2019-12-07 DIAGNOSIS — G309 Alzheimer's disease, unspecified: Secondary | ICD-10-CM | POA: Diagnosis not present

## 2019-12-07 DIAGNOSIS — F0280 Dementia in other diseases classified elsewhere without behavioral disturbance: Secondary | ICD-10-CM | POA: Diagnosis not present

## 2019-12-07 DIAGNOSIS — U071 COVID-19: Secondary | ICD-10-CM | POA: Diagnosis not present

## 2019-12-07 DIAGNOSIS — S3091XD Unspecified superficial injury of lower back and pelvis, subsequent encounter: Secondary | ICD-10-CM | POA: Diagnosis not present

## 2019-12-07 DIAGNOSIS — M6281 Muscle weakness (generalized): Secondary | ICD-10-CM | POA: Diagnosis not present

## 2019-12-07 DIAGNOSIS — E1165 Type 2 diabetes mellitus with hyperglycemia: Secondary | ICD-10-CM | POA: Diagnosis not present

## 2019-12-07 NOTE — Telephone Encounter (Signed)
Was given in hospital ok to refill?

## 2019-12-08 DIAGNOSIS — I739 Peripheral vascular disease, unspecified: Secondary | ICD-10-CM | POA: Diagnosis not present

## 2019-12-08 DIAGNOSIS — J45909 Unspecified asthma, uncomplicated: Secondary | ICD-10-CM | POA: Diagnosis not present

## 2019-12-08 DIAGNOSIS — E1165 Type 2 diabetes mellitus with hyperglycemia: Secondary | ICD-10-CM | POA: Diagnosis not present

## 2019-12-08 DIAGNOSIS — F0280 Dementia in other diseases classified elsewhere without behavioral disturbance: Secondary | ICD-10-CM | POA: Diagnosis not present

## 2019-12-08 DIAGNOSIS — E46 Unspecified protein-calorie malnutrition: Secondary | ICD-10-CM | POA: Diagnosis not present

## 2019-12-08 DIAGNOSIS — U071 COVID-19: Secondary | ICD-10-CM | POA: Diagnosis not present

## 2019-12-08 DIAGNOSIS — I10 Essential (primary) hypertension: Secondary | ICD-10-CM | POA: Diagnosis not present

## 2019-12-08 DIAGNOSIS — I2510 Atherosclerotic heart disease of native coronary artery without angina pectoris: Secondary | ICD-10-CM | POA: Diagnosis not present

## 2019-12-08 DIAGNOSIS — Z86718 Personal history of other venous thrombosis and embolism: Secondary | ICD-10-CM | POA: Diagnosis not present

## 2019-12-08 DIAGNOSIS — E1151 Type 2 diabetes mellitus with diabetic peripheral angiopathy without gangrene: Secondary | ICD-10-CM | POA: Diagnosis not present

## 2019-12-08 DIAGNOSIS — M6281 Muscle weakness (generalized): Secondary | ICD-10-CM | POA: Diagnosis not present

## 2019-12-08 DIAGNOSIS — Z7984 Long term (current) use of oral hypoglycemic drugs: Secondary | ICD-10-CM | POA: Diagnosis not present

## 2019-12-08 DIAGNOSIS — G309 Alzheimer's disease, unspecified: Secondary | ICD-10-CM | POA: Diagnosis not present

## 2019-12-08 DIAGNOSIS — M810 Age-related osteoporosis without current pathological fracture: Secondary | ICD-10-CM | POA: Diagnosis not present

## 2019-12-08 DIAGNOSIS — S3091XD Unspecified superficial injury of lower back and pelvis, subsequent encounter: Secondary | ICD-10-CM | POA: Diagnosis not present

## 2019-12-08 NOTE — Telephone Encounter (Signed)
Has she been on this since hospital? Has she missed any days? Id like to get a phosphorus level to see if she needs to continue under low phosphorus

## 2019-12-09 ENCOUNTER — Other Ambulatory Visit: Payer: Self-pay | Admitting: *Deleted

## 2019-12-09 DIAGNOSIS — R790 Abnormal level of blood mineral: Secondary | ICD-10-CM

## 2019-12-09 NOTE — Telephone Encounter (Signed)
Spoke to pt's husband Marcello Moores he said pt has been on medication since in the hospital and could not tell me if she has missed any doses. Told him Dr. Yong Channel would like pt to come in for phosphorus level to see if she needs to continue medication. Marcello Moores verbalized understanding and agreed to appt. Asked him what day and time is good? He said any day around 11:00 to 12:00. Told him appt scheduled for Monday Feb 22nd at 11:00 AM for lab draw only. Marcello Moores verbalized understanding. Orders put in [place].

## 2019-12-10 ENCOUNTER — Other Ambulatory Visit: Payer: Self-pay | Admitting: Family Medicine

## 2019-12-10 DIAGNOSIS — E1165 Type 2 diabetes mellitus with hyperglycemia: Secondary | ICD-10-CM | POA: Diagnosis not present

## 2019-12-10 DIAGNOSIS — S3091XD Unspecified superficial injury of lower back and pelvis, subsequent encounter: Secondary | ICD-10-CM | POA: Diagnosis not present

## 2019-12-10 DIAGNOSIS — U071 COVID-19: Secondary | ICD-10-CM | POA: Diagnosis not present

## 2019-12-10 DIAGNOSIS — M6281 Muscle weakness (generalized): Secondary | ICD-10-CM | POA: Diagnosis not present

## 2019-12-10 DIAGNOSIS — G309 Alzheimer's disease, unspecified: Secondary | ICD-10-CM | POA: Diagnosis not present

## 2019-12-10 DIAGNOSIS — F0280 Dementia in other diseases classified elsewhere without behavioral disturbance: Secondary | ICD-10-CM | POA: Diagnosis not present

## 2019-12-10 NOTE — Telephone Encounter (Signed)
See below message from [place]. Do you want me to call in refill to last until then?

## 2019-12-10 NOTE — Telephone Encounter (Signed)
Id rather see what her levels look like without it for a few days to see if she needs it

## 2019-12-11 DIAGNOSIS — U071 COVID-19: Secondary | ICD-10-CM | POA: Diagnosis not present

## 2019-12-11 DIAGNOSIS — F0280 Dementia in other diseases classified elsewhere without behavioral disturbance: Secondary | ICD-10-CM | POA: Diagnosis not present

## 2019-12-11 DIAGNOSIS — G309 Alzheimer's disease, unspecified: Secondary | ICD-10-CM | POA: Diagnosis not present

## 2019-12-11 DIAGNOSIS — E1165 Type 2 diabetes mellitus with hyperglycemia: Secondary | ICD-10-CM | POA: Diagnosis not present

## 2019-12-11 DIAGNOSIS — M6281 Muscle weakness (generalized): Secondary | ICD-10-CM | POA: Diagnosis not present

## 2019-12-11 DIAGNOSIS — S3091XD Unspecified superficial injury of lower back and pelvis, subsequent encounter: Secondary | ICD-10-CM | POA: Diagnosis not present

## 2019-12-13 ENCOUNTER — Other Ambulatory Visit: Payer: Self-pay

## 2019-12-13 ENCOUNTER — Other Ambulatory Visit (INDEPENDENT_AMBULATORY_CARE_PROVIDER_SITE_OTHER): Payer: Medicare Other

## 2019-12-13 DIAGNOSIS — R790 Abnormal level of blood mineral: Secondary | ICD-10-CM | POA: Diagnosis not present

## 2019-12-13 DIAGNOSIS — U071 COVID-19: Secondary | ICD-10-CM

## 2019-12-13 LAB — COMPREHENSIVE METABOLIC PANEL
ALT: 11 U/L (ref 0–35)
AST: 18 U/L (ref 0–37)
Albumin: 3.5 g/dL (ref 3.5–5.2)
Alkaline Phosphatase: 74 U/L (ref 39–117)
BUN: 23 mg/dL (ref 6–23)
CO2: 32 mEq/L (ref 19–32)
Calcium: 10.4 mg/dL (ref 8.4–10.5)
Chloride: 103 mEq/L (ref 96–112)
Creatinine, Ser: 0.79 mg/dL (ref 0.40–1.20)
GFR: 68.89 mL/min (ref >60.00)
Glucose, Bld: 185 mg/dL — ABNORMAL HIGH (ref 70–99)
Potassium: 4.4 mEq/L (ref 3.5–5.1)
Sodium: 141 mEq/L (ref 135–145)
Total Bilirubin: 0.4 mg/dL (ref 0.2–1.2)
Total Protein: 6.5 g/dL (ref 6.0–8.3)

## 2019-12-13 LAB — CBC WITH DIFFERENTIAL/PLATELET
Basophils Absolute: 0 10*3/uL (ref 0.0–0.1)
Basophils Relative: 0.4 % (ref 0.0–3.0)
Eosinophils Absolute: 0.2 10*3/uL (ref 0.0–0.7)
Eosinophils Relative: 2.3 % (ref 0.0–5.0)
HCT: 42 % (ref 36.0–46.0)
Hemoglobin: 13.7 g/dL (ref 12.0–15.0)
Lymphocytes Relative: 21.2 % (ref 12.0–46.0)
Lymphs Abs: 2 10*3/uL (ref 0.7–4.0)
MCHC: 32.7 g/dL (ref 30.0–36.0)
MCV: 99.8 fl (ref 78.0–100.0)
Monocytes Absolute: 0.5 10*3/uL (ref 0.1–1.0)
Monocytes Relative: 5.8 % (ref 3.0–12.0)
Neutro Abs: 6.6 10*3/uL (ref 1.4–7.7)
Neutrophils Relative %: 70.3 % (ref 43.0–77.0)
Platelets: 360 10*3/uL (ref 150.0–400.0)
RBC: 4.21 Mil/uL (ref 3.87–5.11)
RDW: 14 % (ref 11.5–15.5)
WBC: 9.4 10*3/uL (ref 4.0–10.5)

## 2019-12-13 LAB — PHOSPHORUS: Phosphorus: 2.8 mg/dL (ref 2.3–4.6)

## 2019-12-14 DIAGNOSIS — U071 COVID-19: Secondary | ICD-10-CM | POA: Diagnosis not present

## 2019-12-14 DIAGNOSIS — E1165 Type 2 diabetes mellitus with hyperglycemia: Secondary | ICD-10-CM | POA: Diagnosis not present

## 2019-12-14 DIAGNOSIS — S3091XD Unspecified superficial injury of lower back and pelvis, subsequent encounter: Secondary | ICD-10-CM | POA: Diagnosis not present

## 2019-12-14 DIAGNOSIS — F0280 Dementia in other diseases classified elsewhere without behavioral disturbance: Secondary | ICD-10-CM | POA: Diagnosis not present

## 2019-12-14 DIAGNOSIS — M6281 Muscle weakness (generalized): Secondary | ICD-10-CM | POA: Diagnosis not present

## 2019-12-14 DIAGNOSIS — G309 Alzheimer's disease, unspecified: Secondary | ICD-10-CM | POA: Diagnosis not present

## 2019-12-15 ENCOUNTER — Telehealth: Payer: Self-pay | Admitting: Family Medicine

## 2019-12-15 MED ORDER — TRAMADOL HCL 50 MG PO TABS: 25.0000 mg | ORAL_TABLET | Freq: Three times a day (TID) | ORAL | 0 refills | Status: AC | PRN

## 2019-12-15 NOTE — Telephone Encounter (Signed)
I would recommend Tylenol 650 mg every 6 hours scheduled.  If this still fails to control her pain I would consider 1/2 tablet of tramadol 50 mg-please let them know I am out of the office until Monday so I am sending in some tramadol but once again I want her to try the Tylenol first

## 2019-12-15 NOTE — Telephone Encounter (Signed)
Called and lm for [name] to return my call.

## 2019-12-15 NOTE — Telephone Encounter (Signed)
Erica from [facility] returned my call and states Pt c/o pain from wound in sacral area and they are wanting to know if something stronger can be Rx'd. They have only been doing Tylenol 200mg  4x daily and she has been in pain even with wound care changing of the dressing.

## 2019-12-15 NOTE — Addendum Note (Signed)
Addended by: Marin Olp on: 12/15/2019 05:44 PM   Modules accepted: Orders

## 2019-12-15 NOTE — Telephone Encounter (Signed)
Erica from [facility] was with the patient yesterday and she stated that the patient was in a lot of pain since Sunday. Patients family wanted to know if there was anything that could be prescribed.  Danae Chen number is [phone].

## 2019-12-16 NOTE — Telephone Encounter (Signed)
Called pt/husband and left VM to call the office.   OK to schedule for 12/22/19 at 11:20 AM.

## 2019-12-16 NOTE — Telephone Encounter (Signed)
Called Erica from [facility] and husband and advised per Dr. Yong Channel.   Husband said that he needed to schedule an appointment for pt with Dr. Yong Channel in early March to discuss palliative care. Checked schedule and do no see any availability until April.   Dr. Yong Channel, would you like to work pt in sooner? If so, what day/time?

## 2019-12-16 NOTE — Telephone Encounter (Signed)
Patient has been scheduled

## 2019-12-16 NOTE — Telephone Encounter (Signed)
Can use same day slots

## 2019-12-21 DIAGNOSIS — E1165 Type 2 diabetes mellitus with hyperglycemia: Secondary | ICD-10-CM | POA: Diagnosis not present

## 2019-12-21 DIAGNOSIS — M6281 Muscle weakness (generalized): Secondary | ICD-10-CM | POA: Diagnosis not present

## 2019-12-21 DIAGNOSIS — F0280 Dementia in other diseases classified elsewhere without behavioral disturbance: Secondary | ICD-10-CM | POA: Diagnosis not present

## 2019-12-21 DIAGNOSIS — G309 Alzheimer's disease, unspecified: Secondary | ICD-10-CM | POA: Diagnosis not present

## 2019-12-21 DIAGNOSIS — U071 COVID-19: Secondary | ICD-10-CM | POA: Diagnosis not present

## 2019-12-21 DIAGNOSIS — S3091XD Unspecified superficial injury of lower back and pelvis, subsequent encounter: Secondary | ICD-10-CM | POA: Diagnosis not present

## 2019-12-22 ENCOUNTER — Other Ambulatory Visit: Payer: Self-pay

## 2019-12-22 ENCOUNTER — Telehealth: Payer: Self-pay | Admitting: Internal Medicine

## 2019-12-22 ENCOUNTER — Ambulatory Visit (INDEPENDENT_AMBULATORY_CARE_PROVIDER_SITE_OTHER): Payer: Medicare Other | Admitting: Family Medicine

## 2019-12-22 ENCOUNTER — Encounter: Payer: Self-pay | Admitting: Family Medicine

## 2019-12-22 DIAGNOSIS — E1159 Type 2 diabetes mellitus with other circulatory complications: Secondary | ICD-10-CM

## 2019-12-22 DIAGNOSIS — G309 Alzheimer's disease, unspecified: Secondary | ICD-10-CM | POA: Diagnosis not present

## 2019-12-22 DIAGNOSIS — F0280 Dementia in other diseases classified elsewhere without behavioral disturbance: Secondary | ICD-10-CM

## 2019-12-22 DIAGNOSIS — E1169 Type 2 diabetes mellitus with other specified complication: Secondary | ICD-10-CM | POA: Diagnosis not present

## 2019-12-22 DIAGNOSIS — L97509 Non-pressure chronic ulcer of other part of unspecified foot with unspecified severity: Secondary | ICD-10-CM | POA: Diagnosis not present

## 2019-12-22 DIAGNOSIS — J4540 Moderate persistent asthma, uncomplicated: Secondary | ICD-10-CM | POA: Diagnosis not present

## 2019-12-22 DIAGNOSIS — I10 Essential (primary) hypertension: Secondary | ICD-10-CM

## 2019-12-22 DIAGNOSIS — U071 COVID-19: Secondary | ICD-10-CM

## 2019-12-22 DIAGNOSIS — E785 Hyperlipidemia, unspecified: Secondary | ICD-10-CM

## 2019-12-22 DIAGNOSIS — I152 Hypertension secondary to endocrine disorders: Secondary | ICD-10-CM

## 2019-12-22 DIAGNOSIS — E11621 Type 2 diabetes mellitus with foot ulcer: Secondary | ICD-10-CM | POA: Diagnosis not present

## 2019-12-22 NOTE — Assessment & Plan Note (Signed)
Ideally LDL would be under 70 with peripheral vascular disease and aortic atherosclerosis -with patient debility I do not feel strongly about starting statin at this time-can also be discussed with palliative care

## 2019-12-22 NOTE — Progress Notes (Signed)
Phone [phone] Virtual visit via Video note   Subjective:  Chief complaint: Chief Complaint  Patient presents with  . Orders    Discuss Palliative care    This visit type was conducted due to national recommendations for restrictions regarding the COVID-19 Pandemic (e.g. social distancing).  This format is felt to be most appropriate for this patient at this time balancing risks to patient and risks to population by having him in for in person visit.  No physical exam was performed (except for noted visual exam or audio findings with Telehealth visits).    Our team/I connected with [name] at 11:20 AM EST by a video enabled telemedicine application ([email or web address] or caregility through epic) and verified that I am speaking with the correct person using two identifiers.  Location patient: Home-[facility] Location provider: [facility], office Persons participating in the virtual visit:  patient.Patient presents for video visit today with her daughter [name] in person, her husband, her other daughter Lars Mage available by phone  Our team/I discussed the limitations of evaluation and management by telemedicine and the availability of in person appointments. In light of current covid-19 pandemic, patient also understands that we are trying to protect them by minimizing in office contact if at all possible.  The patient expressed consent for telemedicine visit and agreed to proceed. Patient understands insurance will be billed.   Past Medical History-  Patient Active Problem List   Diagnosis Date Noted  . COVID-19 virus infection 10/23/2019    Priority: High  . History of DVT in adulthood 06/26/2017    Priority: High  . Coronary artery calcification seen on CAT scan 08/28/2016    Priority: High  . Alzheimer's disease ([address]) 01/04/2016    Priority: High  . Diabetes ([place]) 12/18/2006    Priority: High  . Atherosclerosis of native arteries of extremity with intermittent claudication ([place]) 12/18/2006      Priority: High  . Hyperlipidemia associated with type 2 diabetes mellitus ([place]) 10/30/2019    Priority: Medium  . Osteoporosis 07/02/2018    Priority: Medium  . Generalized weakness 05/02/2018    Priority: Medium  . Pancreas cyst 07/02/2015    Priority: Medium  . Asthma, chronic 03/16/2015    Priority: Medium  . Primary open angle glaucoma of left eye, moderate stage 12/25/2014    Priority: Medium  . Hypertension associated with diabetes ([place]) 12/18/2006    Priority: Medium  . Aortic atherosclerosis ([place]) 10/30/2019    Priority: Low  . Bradycardia with 51-60 beats per minute 10/24/2019    Priority: Low  . Protein-calorie malnutrition ([place]) 07/21/2018    Priority: Low  . GERD (gastroesophageal reflux disease) 05/02/2018    Priority: Low  . LBBB (left bundle branch block) 10/09/2016    Priority: Low  . Pleural effusion, left 09/23/2016    Priority: Low  . Pseudophakia of both eyes 02/15/2013    Priority: Low  . Anemia 05/25/2012    Priority: Low  . Astigmatism 04/29/2012    Priority: Low  . Allergic rhinitis 10/30/2010    Priority: Low  . Venous (peripheral) insufficiency 04/13/2007    Priority: Low  . OSTEOARTHRITIS OF SPINE, NOS 12/18/2006    Priority: Low  . Encephalopathy 11/04/2019  . Pressure injury of skin 11/04/2019  . Hyperglycemia 11/03/2019  . Diabetic ulcer of foot with fat layer exposed ([employer]) 05/14/2018    Medications- reviewed and updated Current Outpatient Medications  Medication Sig Dispense Refill  . albuterol (PROAIR HFA) 108 (90 Base) MCG/ACT inhaler  2 PUFFS EVERY 6 HOURS AS NEEDED FOR SHORTNESS OF BREATH. (Patient taking differently: Inhale 2 puffs into the lungs every 6 (six) hours as needed for wheezing or shortness of breath. ) 17 g 5  . albuterol (PROVENTIL) (2.5 MG/3ML) 0.083% nebulizer solution USE THREE MILLILITERS VIA NEBULIZATION BY MOUTH EVERY 6 HOURS AS NEEDED FOR WHEEZING OR SHORTNESS OF BREATH (Patient taking differently: Take 2.5 mg  by nebulization daily. ) 300 mL 1  . amLODipine (NORVASC) 10 MG tablet Take 1 tablet (10 mg total) by mouth daily. 90 tablet 1  . ascorbic acid (VITAMIN C) 500 MG tablet Take 500 mg by mouth 2 (two) times daily.    Marland Kitchen aspirin 81 MG tablet Take 81 mg by mouth daily.    . brimonidine (ALPHAGAN P) 0.1 % SOLN Place 1 drop into both eyes 2 (two) times daily.     . budesonide (PULMICORT) 0.25 MG/2ML nebulizer solution Take 2 mLs (0.25 mg total) by nebulization daily. ICD10 Code-J45.909 (Patient taking differently: Take 0.25 mg by nebulization at bedtime. ICD10 Code-J45.909) 60 mL 12  . calcium carbonate (OS-CAL) 600 MG TABS Take 600 mg by mouth 2 (two) times daily with a meal.    . citalopram (CELEXA) 10 MG tablet TAKE 1 TABLET ONCE DAILY. (Patient taking differently: Take 10 mg by mouth daily. ) 90 tablet 0  . dexamethasone (DECADRON) 4 MG tablet     . donepezil (ARICEPT) 10 MG tablet TAKE ONE TABLET AT BEDTIME. (Patient taking differently: Take 10 mg by mouth at bedtime. ) 90 tablet 3  . famotidine (PEPCID) 20 MG tablet TAKE ONE TABLET TWICE A DAY AS NEEDED FOR HEARTBURN OR INDIGESTION. 120 tablet 3  . ferrous sulfate 325 (65 FE) MG EC tablet Take 325 mg by mouth daily with breakfast.     . K Phos Mono-Sod Phos Di & Mono (PHOSPHA 250 NEUTRAL) 155-852-130 MG TABS TAKE 1 TABLET BY MOUTH DAILY. 30 tablet 0  . memantine (NAMENDA) 10 MG tablet TAKE 1 TABLET TWICE DAILY FOR MEMORY. (Patient taking differently: Take 10 mg by mouth 2 (two) times daily. ) 180 tablet 3  . metFORMIN (GLUCOPHAGE) 500 MG tablet Take 1 tablet (500 mg total) by mouth 2 (two) times daily with a meal. 180 tablet 1  . Multiple Vitamin (MULTIVITAMIN WITH MINERALS) TABS tablet Take 1 tablet by mouth daily.    Marland Kitchen senna-docusate (SENOKOT-S) 8.6-50 MG tablet Take 1 tablet by mouth 2 (two) times daily between meals as needed for mild constipation. 60 tablet 0   No current facility-administered medications for this visit.     Objective:  BP  119/70   Ht 5\' 4"  (1.626 m)   LMP  (LMP Unknown)   BMI 22.14 kg/m  self reported vitals Gen: NAD, resting comfortably-more alert than last visit though she does still fall asleep during conversation Lungs: nonlabored, normal respiratory rate  Skin: appears dry, no obvious rash     Assessment and Plan   Patient presents for video visit today with her daughter [name] in person, her husband, her other daughter Lars Mage available by phone  #Pressure ulceration-Pressure sore on bottom appears to be ever so slightly worsening. Giving cream and pads from wound care nurse- wound care with home health- once a week Erica.  I encouraged family/caregiver Stacy to relay information to caregiver that they believe symptoms are slightly worse -Apparently there is a pad that has been helpful when pressure ulcers open/become stage II-I am happy to sign for these and  they are going to discuss with wound care   Alzheimer's disease [facility]) Patient with advanced dementia.  She remains on Aricept and Namenda.  She has had a decline in her functional status and alertness as well as appetite post COVID-19 hospitalizations.  She is also had issues with pressure ulcers. -I wonder if at this point we should consider de-escalating medicines such as discontinuing Aricept and Namenda potentially though I did not specifically discuss this with family -Family is interested in palliative care consult and they would be open to hospice if patient qualified-referral to palliative care placed today  COVID-19 virus infection 20 hours a day sleep after covid hospitalization x 3 in January 2020 Since her last visit-Alertness improving, Physical interactions improving- she made progress with PT/OT, Family supports her standing- if she desires to stand then they do get her up, Strength improving- family wants to see her walking more.  Walked 4 times around [employer] this morning- good days and bad days -We opted to continue to  monitor her progress post Covid hospitalization-she has made some gradual improvements and we agreed to 6-week follow-up  Diabetes ([place]) Overall appears stable-continue current medications for now   appetite doing better but family or caregiver support feedings at times- she feeds herself at times- she swill always feed herself ice cream  Patient is compliant with Metformin 500 mg once a day.  Blood sugars after meals ranging from 140s to 200s but usually under 180.  I suspect her A1c is increasing but I do not feel strongly about further increasing medicine-Metformin may be an appetite suppressant.  With how high sugars are I do not feel strongly about stopping either unless this is decided with patient and family with palliative care  She does have recurrence of foot ulcers from sitting/being bedbound most of the time-she had success with nitroglycerin in the past with Dr. Sharol Given and family is going to try that again-encouraged on try 1 foot at a time and stop if worsening issues.  Can also ask wound care nurse who is coming out to the home-apparently only have 1 more visit and that may need to be extended  Asthma, chronic Breathing has improved after Covid hospitalization.Asthma ok lately- pulmicort twice a day. Not using albuterol.   Hypertension associated with diabetes ([place]) Blood pressures ranging from 1 teens to 140s on amlodipine 10 mg-I would consider this overall stable-continue current medication  Hyperlipidemia associated with type 2 diabetes mellitus ([place]) Ideally LDL would be under 70 with peripheral vascular disease and aortic atherosclerosis -with patient debility I do not feel strongly about starting statin at this time-can also be discussed with palliative care    Recommended follow up: 6-week follow-up planned/discussed  Lab/Order associations:   ICD-10-CM   1. Alzheimer's dementia without behavioral disturbance, unspecified timing of dementia onset ([place])  G30.9 Amb  Referral to Palliative Care   F02.80 CANCELED: Amb Referral to Palliative Care  2. COVID-19 virus infection  U07.1   3. Type 2 diabetes mellitus with foot ulcer, without long-term current use of insulin ([place])  E11.621    L97.509   4. Moderate persistent chronic asthma without complication  123456   5. Hypertension associated with diabetes ([place])  E11.59    I10   6. Hyperlipidemia associated with type 2 diabetes mellitus ([facility])  E11.69    E78.5    Time Spent: 31 minutes of total time (11:49 AM- 12:10 PM, 12:39 PM - 12:49 PM) was spent on the date of the  encounter performing the following actions: chart review prior to seeing the patient, obtaining history, performing a medically necessary exam, counseling on the treatment plan, placing orders, and documenting in our EHR.   Return precautions advised.  Garret Reddish, MD

## 2019-12-22 NOTE — Assessment & Plan Note (Signed)
Patient with advanced dementia.  She remains on Aricept and Namenda.  She has had a decline in her functional status and alertness as well as appetite post COVID-19 hospitalizations.  She is also had issues with pressure ulcers. -I wonder if at this point we should consider de-escalating medicines such as discontinuing Aricept and Namenda potentially though I did not specifically discuss this with family -Family is interested in palliative care consult and they would be open to hospice if patient qualified-referral to palliative care placed today

## 2019-12-22 NOTE — Assessment & Plan Note (Signed)
Breathing has improved after Covid hospitalization.Asthma ok lately- pulmicort twice a day. Not using albuterol.

## 2019-12-22 NOTE — Assessment & Plan Note (Signed)
20 hours a day sleep after covid hospitalization x 3 in January 2020 Since her last visit-Alertness improving, Physical interactions improving- she made progress with PT/OT, Family supports her standing- if she desires to stand then they do get her up, Strength improving- family wants to see her walking more.  Walked 4 times around [employer] this morning- good days and bad days -We opted to continue to monitor her progress post Covid hospitalization-she has made some gradual improvements and we agreed to 6-week follow-up

## 2019-12-22 NOTE — Assessment & Plan Note (Addendum)
Overall appears stable-continue current medications for now   appetite doing better but family or caregiver support feedings at times- she feeds herself at times- she swill always feed herself ice cream  Patient is compliant with Metformin 500 mg once a day.  Blood sugars after meals ranging from 140s to 200s but usually under 180.  I suspect her A1c is increasing but I do not feel strongly about further increasing medicine-Metformin may be an appetite suppressant.  With how high sugars are I do not feel strongly about stopping either unless this is decided with patient and family with palliative care  She does have recurrence of foot ulcers from sitting/being bedbound most of the time-she had success with nitroglycerin in the past with Dr. Sharol Given and family is going to try that again-encouraged on try 1 foot at a time and stop if worsening issues.  Can also ask wound care nurse who is coming out to the home-apparently only have 1 more visit and that may need to be extended

## 2019-12-22 NOTE — Patient Instructions (Signed)
Health Maintenance Due  Topic Date Due  . OPHTHALMOLOGY EXAM  will address at next visit in office.  05/19/2019  . FOOT EXAM will address in next visit  07/25/2019  . URINE MICROALBUMIN  Will address in next visit  09/24/2019    Depression screen [facility] 2/9 02/11/2019 09/03/2017 06/17/2017  Decreased Interest 0 0 0  Down, Depressed, Hopeless 0 0 0  PHQ - 2 Score 0 0 0  Some recent data might be hidden    Recommended follow up: No follow-ups on file.

## 2019-12-22 NOTE — Assessment & Plan Note (Signed)
Blood pressures ranging from 1 teens to 140s on amlodipine 10 mg-I would consider this overall stable-continue current medication

## 2019-12-27 ENCOUNTER — Encounter: Payer: Self-pay | Admitting: Physician Assistant

## 2019-12-27 ENCOUNTER — Telehealth (INDEPENDENT_AMBULATORY_CARE_PROVIDER_SITE_OTHER): Payer: Medicare Other | Admitting: Physician Assistant

## 2019-12-27 VITALS — BP 125/58 | HR 72 | Ht 64.0 in | Wt 129.0 lb

## 2019-12-27 DIAGNOSIS — E785 Hyperlipidemia, unspecified: Secondary | ICD-10-CM

## 2019-12-27 DIAGNOSIS — I2510 Atherosclerotic heart disease of native coronary artery without angina pectoris: Secondary | ICD-10-CM

## 2019-12-27 DIAGNOSIS — I872 Venous insufficiency (chronic) (peripheral): Secondary | ICD-10-CM

## 2019-12-27 DIAGNOSIS — I10 Essential (primary) hypertension: Secondary | ICD-10-CM

## 2019-12-27 DIAGNOSIS — Z7982 Long term (current) use of aspirin: Secondary | ICD-10-CM | POA: Diagnosis not present

## 2019-12-27 DIAGNOSIS — I739 Peripheral vascular disease, unspecified: Secondary | ICD-10-CM

## 2019-12-27 DIAGNOSIS — E1169 Type 2 diabetes mellitus with other specified complication: Secondary | ICD-10-CM

## 2019-12-27 NOTE — Progress Notes (Signed)
Virtual Visit via Telephone Note   This visit type was conducted due to national recommendations for restrictions regarding the COVID-19 Pandemic (e.g. social distancing) in an effort to limit this patient's exposure and mitigate transmission in our community.  Due to her co-morbid illnesses, this patient is at least at moderate risk for complications without adequate follow up.  This format is felt to be most appropriate for this patient at this time.  The patient did not have access to video technology/had technical difficulties with video requiring transitioning to audio format only (telephone).  All issues noted in this document were discussed and addressed.  No physical exam could be performed with this format.  Please refer to the patient's chart for her  consent to telehealth for [facility].  Evaluation Performed:  Follow-up visit  This visit type was conducted due to national recommendations for restrictions regarding the COVID-19 Pandemic (e.g. social distancing).  This format is felt to be most appropriate for this patient at this time.  All issues noted in this document were discussed and addressed.  No physical exam was performed (except for noted visual exam findings with Video Visits).  Please refer to the patient's chart (MyChart message for video visits and phone note for telephone visits) for the patient's consent to telehealth for [facility].  Date:  12/27/2019   ID:  [name], DOB 01-24-1931, MRN [id number]  Patient Location:  Home  Provider location:   Off-site  PCP:  Marin Olp, MD  Cardiologist:  Glenetta Hew, MD  02/17/2018 Electrophysiologist:  None   Chief Complaint:  Cardiology follow up  History of Present Illness:    [name] is a 86 y.o. female who presents via audio/video conferencing for a telehealth visit today.    86 y.o. yo female who has a hx of DM, HTN, HLD, Alzheimers, GERD, PAD with medical therapy, OA, CKD III, Anemia,  COVID 09/2019, abnormal Myoview with medical therapy, asthma  [name] is not really ambulatory, requires help with movement.  Her husband, daughter, and caregiver are all present and help provide information.  [name] has a poor memory.  She has not had chest pain with activity, she has SOB w/ activity, chronic and no recent change. She rarely wheezes, uses a neb bid and has a rescue inhaler as well. Denies LE edema, no orthopnea or PND.  She is still recovering from [place], her respiratory status is not back to baseline. She is gradually walking more. However, she still has to be fed.  She is too weak to hold utensils.  She has some special utensils given to her by OT, hopefully, she will be able to use them soon.  She eats very little. The family feeds her, she is getting OT to help her get stronger.   She is taking her medications as prescribed.  The patient does not have symptoms concerning for COVID-19 infection (fever, chills, cough, or new shortness of breath).    Prior CV studies:   The following studies were reviewed today:  MYOVIEW: 10/25/2016  The left ventricular ejection fraction is moderately decreased (30-44%).  Nuclear stress EF: 38%.  This is an intermediate risk study.  There was no ST segment deviation noted during stress.   Normal resting and stress perfusion. No ischemia or infarction EF 38% with diffuse hypokinesis and abnormal septal motion    ECHO: 11/20/2016 - Left ventricle: Inferior wall hypokinesis. Systolic function was  mildly reduced. The  estimated ejection fraction was in the range  of 45% to 50%. The study is not technically sufficient to allow  evaluation of LV diastolic function.  - Aortic valve: There was mild regurgitation.  - Mitral valve: Thickened and calcified anterior leaflet There was  mild regurgitation.  - Pulmonary arteries: PA peak pressure: 37 mm Hg (S).   ABI: 05/18/2019 Summary:  Right: The right toe-brachial index is  abnormal. RT great toe pressure =  81 mmHg.  Although ankle brachial indices are within normal limits (0.95-1.29),  arterial Doppler waveforms at the ankle suggest some component of arterial  occlusive disease.  Left: Resting left ankle-brachial index indicates moderate left lower  extremity arterial disease. The left toe-brachial index is abnormal. LT  Great toe pressure = 37 mmHg.   Past Medical History:  Diagnosis Date  . Alzheimer's dementia without behavioral disturbance ([place])    diagnosed 12/2015; Dr. Jade/neurology  . Anemia   . Asthma   . Blood transfusion 05-24-12  . Blood transfusion without reported diagnosis   . Chronic kidney disease    dr Clover Mealy  . Diabetes mellitus   . Diverticulosis   . DVT (deep venous thrombosis) ([place]) 03/2005   hx.left leg  . Esophageal stricture   . GERD (gastroesophageal reflux disease)   . Glaucoma   . Hiatal hernia 05/07/13  . Hyperlipidemia   . Hypertension   . Osteoarthritis   . Pancreatic duct dilated 05/07/13  . Pulmonary nodules 05/07/13  . PVD (peripheral vascular disease) ([place])   . Tubular adenoma of colon    Past Surgical History:  Procedure Laterality Date  . CATARACT EXTRACTION, BILATERAL    . COLONOSCOPY  05/27/2012   Procedure: COLONOSCOPY;  Surgeon: Lafayette Dragon, MD;  Location: [facility] ENDOSCOPY;  Service: Endoscopy;  Laterality: N/A;  . ESOPHAGOGASTRODUODENOSCOPY  05/26/2012   Procedure: ESOPHAGOGASTRODUODENOSCOPY (EGD);  Surgeon: Lafayette Dragon, MD;  Location: [facility];  Service: Endoscopy;  Laterality: N/A;  . EUS N/A 07/08/2013   Procedure: UPPER ENDOSCOPIC ULTRASOUND (EUS) LINEAR;  Surgeon: Milus Banister, MD;  Location: [facility] ENDOSCOPY;  Service: Endoscopy;  Laterality: N/A;  need side view scope  . EYE SURGERY    . NM MYOVIEW LTD  10/2016   Read as intermediate risk due to EF of 38%.Anemia or infarction noted. Only abnormal septal WM.   Marland Kitchen TRANSTHORACIC ECHOCARDIOGRAM  10/2016    EF 45-50% with possible inferior  hypokinesis. Aortic sclerosis without stenosis (ordered to reassess EF from Myoview)  . VASCULAR SURGERY  09/2004  . WRIST FRACTURE SURGERY     R wrist fracture, plate     Current Meds  Medication Sig  . albuterol (PROAIR HFA) 108 (90 Base) MCG/ACT inhaler 2 PUFFS EVERY 6 HOURS AS NEEDED FOR SHORTNESS OF BREATH. (Patient taking differently: Inhale 2 puffs into the lungs every 6 (six) hours as needed for wheezing or shortness of breath. )  . albuterol (PROVENTIL) (2.5 MG/3ML) 0.083% nebulizer solution USE THREE MILLILITERS VIA NEBULIZATION BY MOUTH EVERY 6 HOURS AS NEEDED FOR WHEEZING OR SHORTNESS OF BREATH (Patient taking differently: Take 2.5 mg by nebulization daily. )  . amLODipine (NORVASC) 10 MG tablet Take 1 tablet (10 mg total) by mouth daily.  Marland Kitchen ascorbic acid (VITAMIN C) 500 MG tablet Take 500 mg by mouth 2 (two) times daily.  Marland Kitchen aspirin 81 MG tablet Take 81 mg by mouth daily.  . brimonidine (ALPHAGAN P) 0.1 % SOLN Place 1 drop into both eyes 2 (two) times daily.   Marland Kitchen  budesonide (PULMICORT) 0.25 MG/2ML nebulizer solution Take 2 mLs (0.25 mg total) by nebulization daily. ICD10 Code-J45.909 (Patient taking differently: Take 0.25 mg by nebulization at bedtime. ICD10 E7706831)  . calcium carbonate (OS-CAL) 600 MG TABS Take 600 mg by mouth 2 (two) times daily with a meal.  . citalopram (CELEXA) 10 MG tablet TAKE 1 TABLET ONCE DAILY. (Patient taking differently: Take 10 mg by mouth daily. )  . dexamethasone (DECADRON) 4 MG tablet   . donepezil (ARICEPT) 10 MG tablet TAKE ONE TABLET AT BEDTIME. (Patient taking differently: Take 10 mg by mouth at bedtime. )  . famotidine (PEPCID) 20 MG tablet TAKE ONE TABLET TWICE A DAY AS NEEDED FOR HEARTBURN OR INDIGESTION.  . ferrous sulfate 325 (65 FE) MG EC tablet Take 325 mg by mouth daily with breakfast.   . K Phos Mono-Sod Phos Di & Mono (PHOSPHA 250 NEUTRAL) 155-852-130 MG TABS TAKE 1 TABLET BY MOUTH DAILY.  . memantine (NAMENDA) 10 MG tablet TAKE 1  TABLET TWICE DAILY FOR MEMORY. (Patient taking differently: Take 10 mg by mouth 2 (two) times daily. )  . metFORMIN (GLUCOPHAGE) 500 MG tablet Take 1 tablet (500 mg total) by mouth 2 (two) times daily with a meal.  . Multiple Vitamin (MULTIVITAMIN WITH MINERALS) TABS tablet Take 1 tablet by mouth daily.  Marland Kitchen senna-docusate (SENOKOT-S) 8.6-50 MG tablet Take 1 tablet by mouth 2 (two) times daily between meals as needed for mild constipation.     Allergies:   Patient has no known allergies.   Social History   Tobacco Use  . Smoking status: Never Smoker  . Smokeless tobacco: Never Used  Substance Use Topics  . Alcohol use: No  . Drug use: No     Family Hx: The patient's family history includes Cancer in her son; Migraines in her daughter. There is no history of Colon cancer. She was adopted.  ROS:   Please see the history of present illness.    All other systems reviewed and are negative.   Labs/Other Tests and Data Reviewed:    Recent Labs: 11/07/2019: Magnesium 1.7 12/13/2019: ALT 11; BUN 23; Creatinine, Ser 0.79; Hemoglobin 13.7; Platelets 360.0; Potassium 4.4; Sodium 141   CBC    Component Value Date/Time   WBC 9.4 12/13/2019 1108   RBC 4.21 12/13/2019 1108   HGB 13.7 12/13/2019 1108   HGB 12.6 12/31/2017 1337   HCT 42.0 12/13/2019 1108   HCT 40.8 12/31/2017 1337   PLT 360.0 12/13/2019 1108   PLT 314 12/31/2017 1337   MCV 99.8 12/13/2019 1108   MCV 100 (H) 12/31/2017 1337   MCH 33.0 11/07/2019 0355   MCHC 32.7 12/13/2019 1108   RDW 14.0 12/13/2019 1108   RDW 13.8 12/31/2017 1337   LYMPHSABS 2.0 12/13/2019 1108   LYMPHSABS 1.0 12/31/2017 1337   MONOABS 0.5 12/13/2019 1108   EOSABS 0.2 12/13/2019 1108   EOSABS 0.6 (H) 12/31/2017 1337   BASOSABS 0.0 12/13/2019 1108   BASOSABS 0.0 12/31/2017 1337    CMP Latest Ref Rng & Units 12/13/2019 11/18/2019 11/07/2019  Glucose 70 - 99 mg/dL 185(H) - 101(H)  BUN 6 - 23 mg/dL 23 16 17   Creatinine 0.40 - 1.20 mg/dL 0.79 0.7 0.60    Sodium 135 - 145 mEq/L 141 138 136  Potassium 3.5 - 5.1 mEq/L 4.4 4.3 4.4  Chloride 96 - 112 mEq/L 103 100 106  CO2 19 - 32 mEq/L 32 - 25  Calcium 8.4 - 10.5 mg/dL 10.4 9.3 8.4(L)  Total Protein 6.0 - 8.3 g/dL 6.5 - -  Total Bilirubin 0.2 - 1.2 mg/dL 0.4 - -  Alkaline Phos 39 - 117 U/L 74 64 -  AST 0 - 37 U/L 18 17 -  ALT 0 - 35 U/L 11 13 -     Recent Lipid Panel Lab Results  Component Value Date/Time   CHOL 184 05/01/2018 12:47 PM   CHOL 180 04/18/2017 03:40 PM   TRIG 87 10/23/2019 04:04 PM   HDL 48.70 05/01/2018 12:47 PM   HDL 62 04/18/2017 03:40 PM   CHOLHDL 4 05/01/2018 12:47 PM   LDLCALC 106 (H) 05/01/2018 12:47 PM   LDLCALC 104 (H) 04/18/2017 03:40 PM    Wt Readings from Last 3 Encounters:  12/27/19 129 lb (58.5 kg)  11/11/19 129 lb (58.5 kg)  11/04/19 129 lb 13.6 oz (58.9 kg)     Objective:    Vital Signs:  BP (!) 125/58   Pulse 72   Ht 5\' 4"  (1.626 m)   Wt 129 lb (58.5 kg)   LMP  (LMP Unknown)   BMI 22.14 kg/m    86 y.o. female in no acute distress over the phone.   ASSESSMENT & PLAN:    1.  Abnormal Myoview: -Megan. Theroux is on aspirin, her blood pressure is well controlled so I will not add a beta-blocker at this time. -She is not having any ischemic symptoms -Her activity level is poor. -At this time, there is no need for additional ischemic evaluation. -Continue to treat symptoms, the family knows to contact us if she develops chest pain or shortness of breath  2.  Hypertension -Her blood pressure is well controlled on amlodipine 10 mg daily.  This may also have some antianginal effects. - No med changes  3. PAD -She has seen Dr. Donnetta Hutching in the past for this -She is currently asymptomatic, not having any leg pain with exertion -According to the family, wounds are healing -Follow-up with Dr. Donnetta Hutching as scheduled  4.  Hyperlipidemia: -In 2019, her LDL was 106.  I do not have more recent labs. -In Dr. Ansel Bong last note, he references that her LDL  would ideally be under 70, but because of her debility, he did not feel strongly about starting 1. -I reviewed the situation with the patient's daughter and husband. -Advised that most of the data we have on statins comes from 5 and 10-year outcomes. -At this time, I do not feel that a statin would improve her quality of life, and would not likely extend her life   COVID-19 Education: The signs and symptoms of COVID-19 were discussed with the patient and how to seek care for testing (follow up with PCP or arrange E-visit).  The importance of social distancing was discussed today.  Patient Risk:   After full review of this patient's clinical status, I feel that they are at least moderate risk at this time.  Time:   Today, I have spent 18 minutes with the patient with telehealth technology discussing cardiology and other medical issues.     Medication Adjustments/Labs and Tests Ordered: Current medicines are reviewed at length with the patient today.  Concerns regarding medicines are outlined above.  Tests Ordered: No orders of the defined types were placed in this encounter.  Medication Changes: No orders of the defined types were placed in this encounter.   Disposition:  Follow up with Glenetta Hew, MD   Signed, Rosaria Ferries, PA-C  12/27/2019 11:31 AM  [facility]

## 2019-12-28 DIAGNOSIS — S3091XD Unspecified superficial injury of lower back and pelvis, subsequent encounter: Secondary | ICD-10-CM | POA: Diagnosis not present

## 2019-12-28 DIAGNOSIS — F0280 Dementia in other diseases classified elsewhere without behavioral disturbance: Secondary | ICD-10-CM | POA: Diagnosis not present

## 2019-12-28 DIAGNOSIS — U071 COVID-19: Secondary | ICD-10-CM | POA: Diagnosis not present

## 2019-12-28 DIAGNOSIS — G309 Alzheimer's disease, unspecified: Secondary | ICD-10-CM | POA: Diagnosis not present

## 2019-12-28 DIAGNOSIS — M6281 Muscle weakness (generalized): Secondary | ICD-10-CM | POA: Diagnosis not present

## 2019-12-28 DIAGNOSIS — E1165 Type 2 diabetes mellitus with hyperglycemia: Secondary | ICD-10-CM | POA: Diagnosis not present

## 2019-12-28 NOTE — Patient Instructions (Signed)
Medication Instructions:  Your physician recommends that you continue on your current medications as directed. Please refer to the Current Medication list given to you today.  *If you need a refill on your cardiac medications before your next appointment, please call your pharmacy*    Follow-Up: At [facility], you and your health needs are our priority.  As part of our continuing mission to provide you with exceptional heart care, we have created designated Provider Care Teams.  These Care Teams include your primary Cardiologist (physician) and Advanced Practice Providers (APPs -  Physician Assistants and Nurse Practitioners) who all work together to provide you with the care you need, when you need it.  We recommend signing up for the patient portal called "MyChart".  Sign up information is provided on this After Visit Summary.  MyChart is used to connect with patients for Virtual Visits (Telemedicine).  Patients are able to view lab/test results, encounter notes, upcoming appointments, etc.  Non-urgent messages can be sent to your provider as well.   To learn more about what you can do with MyChart, go to [email or web address].    Your next appointment:   12 month(s)  The format for your next appointment:   In Person  Provider:   Glenetta Hew, MD   Other Instructions Please call our office 2 months ahead of time to schedule your follow-up appointment.

## 2019-12-30 NOTE — Progress Notes (Signed)
March 12th, 2021 [facility] Palliative Care Consult Note Telephone: [phone]  Fax: [phone]   PATIENT NAME: [name] DOB: [birth year] MRN: [id number] [place] [id number]  PRIMARY CARE PROVIDER:   Marin Olp, MD Dr. Arleta [place] Barrett PA [place] PROVIDER:  Marin Olp, MD [place],  [place] [address]  RESPONSIBLE PARTY: (daughter) Lanell Matar (H) [phone] [id number]. (Spouse) Frady Betler [phone]. ([phone], (H) [phone]   ASSESSMENT / RECOMMENDATIONS:  1. Advance Care Planning: A. Directives: discussed use of cardiopulmonary resuscitation in the event of an arrest. We discussed in brief the poor prognosis of a meaningful survival setting of advanced age and underlying health problems. Patient's spouse currently wishes full resuscitative efforts. We discussed the sections of the MOST form. I provided blank copies and accompanying patient education material for the family to review, and encouraged them to think/discuss among themselves these choices, before a time of crisis.   B. Goals of Care: for patient to continue a happy and healthy life, as long as is possible, in the context of a good quality of life. We did spend a bit of time discussing hospice care and its' services. Should patient meet hospice criteria in the future, the family would be amenable to further discussion.  2. Cognitive / Functional status: Advanced Dementia. FAST 6e. Forgetful. Conversant in complete sentences but almost constantly confused. Family report decline in cognition over the last 3 months. Experiences both auditory and visual hallucinations that are not disturbing to her. She'll see persons at foot of bed; talks to people who aren't there. Patient with increased somnolence over the last 3 months; now awake just 4-6 hours/day. She is continent of bowel and bladder if toileted  regularly. Able to self-transfer from lift chair about 1 year ago, but now needs 1-2 person assist to transfer (with a bit of cueing); bed to chair existence.   She can feed herself finger foods; often needs encouragement and to be fed. BOOST supplements tid. Meds need to be crushed. Last recorded weight (12/27/19) was 128lbs. At a height of 5'4" her BMI is 22.14kg/m2. Family believe no significant weight loss. She is on Metformin bid; her blood sugars range 99 fasting, sometimes up to 170. She has occasional back pain managed with Tylenol 500mg  bid. Pressure injury coccyx size of quarter which heals then re occurs, family wish suggestions for alternative dressing. We discussed importance of decreasing pressure at that site, good nutrition, barrier cream. Has an unstageable pressure injury on one of her heels. H/o peripheral vascular disease with past threatening past amputation of a toe/foot pressure injuries (successfully treated with topical nitroglycerin).   -Recommended PolyMEM dressing for sacral pressure injury.  Discussed instructions.  -Discussed medication simplification should patient become more resistant to taking her meds. If need be, could discontinue multi-vitamins. Possibly Aricept and Namenda, though Aricept can possibly be beneficial setting of hallucinations.   3. Family Supports: Daughters Donette Larry, another daughter, and son Gershon Mussel. M-F private Pay in home aide 5d/week 9-1p, then a teenage young lady works 1pm -5pm. The 3 daughters take turns covering evenings/weekends. Spouse is present in the home at night within same bedroom. Receiving services with [facility] (nurse Izora Gala) for dressing changes; only one visit left.   4. Follow up Palliative Care Visit: Family will call me for patient decline and if they wish to complete DNR or MOST form.  I spent 60 minutes providing this consultation from 11am to noon. More than 50% of the time in this consultation was spent  coordinating communication.   HISTORY OF PRESENT ILLNESS:  [name] is an 86 y.o. yo female who has a hx of DM, HTN, HLD, Alzheimers (dx 2017), diverticulosis, LLE DVT, GERD, PAD with medical therapy, OA, CKD III, Anemia, COVID 09/2019, abnormal Myoview with medical therapy, asthma. Palliative Care was asked to help address goals of care.   CODE STATUS: Full code  PPS: 30% HOSPICE ELIGIBILITY/DIAGNOSIS: TBD  PAST MEDICAL HISTORY:  Past Medical History:  Diagnosis Date  . Alzheimer's dementia without behavioral disturbance ([place])    diagnosed 12/2015; Dr. Jade/neurology  . Anemia   . Asthma   . Blood transfusion 05-24-12  . Blood transfusion without reported diagnosis   . Chronic kidney disease    dr Clover Mealy  . Diabetes mellitus   . Diverticulosis   . DVT (deep venous thrombosis) ([place]) 03/2005   hx.left leg  . Esophageal stricture   . GERD (gastroesophageal reflux disease)   . Glaucoma   . Hiatal hernia 05/07/13  . Hyperlipidemia   . Hypertension   . Osteoarthritis   . Pancreatic duct dilated 05/07/13  . Pulmonary nodules 05/07/13  . PVD (peripheral vascular disease) ([place])   . Tubular adenoma of colon     SOCIAL HX:  Social History   Tobacco Use  . Smoking status: Never Smoker  . Smokeless tobacco: Never Used  Substance Use Topics  . Alcohol use: No    ALLERGIES: No Known Allergies   PERTINENT MEDICATIONS:  Outpatient Encounter Medications as of 12/31/2019  Medication Sig  . albuterol (PROAIR HFA) 108 (90 Base) MCG/ACT inhaler 2 PUFFS EVERY 6 HOURS AS NEEDED FOR SHORTNESS OF BREATH. (Patient taking differently: Inhale 2 puffs into the lungs every 6 (six) hours as needed for wheezing or shortness of breath. )  . albuterol (PROVENTIL) (2.5 MG/3ML) 0.083% nebulizer solution USE THREE MILLILITERS VIA NEBULIZATION BY MOUTH EVERY 6 HOURS AS NEEDED FOR WHEEZING OR SHORTNESS OF BREATH (Patient taking differently: Take 2.5 mg by nebulization daily. )  . amLODipine (NORVASC) 10  MG tablet Take 1 tablet (10 mg total) by mouth daily.  Marland Kitchen ascorbic acid (VITAMIN C) 500 MG tablet Take 500 mg by mouth 2 (two) times daily.  Marland Kitchen aspirin 81 MG tablet Take 81 mg by mouth daily.  . brimonidine (ALPHAGAN P) 0.1 % SOLN Place 1 drop into both eyes 2 (two) times daily.   . budesonide (PULMICORT) 0.25 MG/2ML nebulizer solution Take 2 mLs (0.25 mg total) by nebulization daily. ICD10 Code-J45.909 (Patient taking differently: Take 0.25 mg by nebulization at bedtime. ICD10 [id number])  . calcium carbonate (OS-CAL) 600 MG TABS Take 600 mg by mouth 2 (two) times daily with a meal.  . citalopram (CELEXA) 10 MG tablet TAKE 1 TABLET ONCE DAILY. (Patient taking differently: Take 10 mg by mouth daily. )  . dexamethasone (DECADRON) 4 MG tablet   . donepezil (ARICEPT) 10 MG tablet TAKE ONE TABLET AT BEDTIME. (Patient taking differently: Take 10 mg by mouth at bedtime. )  . famotidine (PEPCID) 20 MG tablet TAKE ONE TABLET TWICE A DAY AS NEEDED FOR HEARTBURN OR INDIGESTION.  . ferrous sulfate 325 (65 FE) MG EC tablet Take 325 mg by mouth daily with breakfast.   . K Phos Mono-Sod Phos Di & Mono (PHOSPHA 250 NEUTRAL) 155-852-130 MG TABS TAKE 1 TABLET BY MOUTH DAILY.  . memantine (NAMENDA) 10 MG tablet  TAKE 1 TABLET TWICE DAILY FOR MEMORY. (Patient taking differently: Take 10 mg by mouth 2 (two) times daily. )  . metFORMIN (GLUCOPHAGE) 500 MG tablet Take 1 tablet (500 mg total) by mouth 2 (two) times daily with a meal.  . Multiple Vitamin (MULTIVITAMIN WITH MINERALS) TABS tablet Take 1 tablet by mouth daily.  Marland Kitchen senna-docusate (SENOKOT-S) 8.6-50 MG tablet Take 1 tablet by mouth 2 (two) times daily between meals as needed for mild constipation.   No facility-administered encounter medications on file as of 12/31/2019.    PHYSICAL EXAM:   General: well nourished, pleasant, repetitious questions, some confusion Cardiovascular: regular rate and rhythm Pulmonary: clear ant fields Extremities: no edema, no  joint deformities Skin: no rashes Neurological: Weakness but otherwise non-focal  Julianne Handler, NP

## 2019-12-31 ENCOUNTER — Other Ambulatory Visit: Payer: Self-pay

## 2019-12-31 ENCOUNTER — Other Ambulatory Visit: Payer: Medicare Other | Admitting: Internal Medicine

## 2019-12-31 DIAGNOSIS — Z515 Encounter for palliative care: Secondary | ICD-10-CM | POA: Diagnosis not present

## 2019-12-31 DIAGNOSIS — Z7189 Other specified counseling: Secondary | ICD-10-CM | POA: Diagnosis not present

## 2020-01-02 ENCOUNTER — Encounter: Payer: Self-pay | Admitting: Internal Medicine

## 2020-01-05 DIAGNOSIS — M6281 Muscle weakness (generalized): Secondary | ICD-10-CM | POA: Diagnosis not present

## 2020-01-05 DIAGNOSIS — G309 Alzheimer's disease, unspecified: Secondary | ICD-10-CM | POA: Diagnosis not present

## 2020-01-05 DIAGNOSIS — E1165 Type 2 diabetes mellitus with hyperglycemia: Secondary | ICD-10-CM | POA: Diagnosis not present

## 2020-01-05 DIAGNOSIS — U071 COVID-19: Secondary | ICD-10-CM | POA: Diagnosis not present

## 2020-01-05 DIAGNOSIS — F0280 Dementia in other diseases classified elsewhere without behavioral disturbance: Secondary | ICD-10-CM | POA: Diagnosis not present

## 2020-01-05 DIAGNOSIS — S3091XD Unspecified superficial injury of lower back and pelvis, subsequent encounter: Secondary | ICD-10-CM | POA: Diagnosis not present

## 2020-01-07 DIAGNOSIS — M6281 Muscle weakness (generalized): Secondary | ICD-10-CM | POA: Diagnosis not present

## 2020-01-07 DIAGNOSIS — I739 Peripheral vascular disease, unspecified: Secondary | ICD-10-CM | POA: Diagnosis not present

## 2020-01-07 DIAGNOSIS — G309 Alzheimer's disease, unspecified: Secondary | ICD-10-CM | POA: Diagnosis not present

## 2020-01-07 DIAGNOSIS — I2510 Atherosclerotic heart disease of native coronary artery without angina pectoris: Secondary | ICD-10-CM | POA: Diagnosis not present

## 2020-01-07 DIAGNOSIS — E1151 Type 2 diabetes mellitus with diabetic peripheral angiopathy without gangrene: Secondary | ICD-10-CM | POA: Diagnosis not present

## 2020-01-07 DIAGNOSIS — S3091XD Unspecified superficial injury of lower back and pelvis, subsequent encounter: Secondary | ICD-10-CM | POA: Diagnosis not present

## 2020-01-07 DIAGNOSIS — Z86718 Personal history of other venous thrombosis and embolism: Secondary | ICD-10-CM | POA: Diagnosis not present

## 2020-01-07 DIAGNOSIS — E1165 Type 2 diabetes mellitus with hyperglycemia: Secondary | ICD-10-CM | POA: Diagnosis not present

## 2020-01-07 DIAGNOSIS — U071 COVID-19: Secondary | ICD-10-CM | POA: Diagnosis not present

## 2020-01-07 DIAGNOSIS — Z7984 Long term (current) use of oral hypoglycemic drugs: Secondary | ICD-10-CM | POA: Diagnosis not present

## 2020-01-07 DIAGNOSIS — J45909 Unspecified asthma, uncomplicated: Secondary | ICD-10-CM | POA: Diagnosis not present

## 2020-01-07 DIAGNOSIS — E46 Unspecified protein-calorie malnutrition: Secondary | ICD-10-CM | POA: Diagnosis not present

## 2020-01-07 DIAGNOSIS — R41841 Cognitive communication deficit: Secondary | ICD-10-CM | POA: Diagnosis not present

## 2020-01-07 DIAGNOSIS — M810 Age-related osteoporosis without current pathological fracture: Secondary | ICD-10-CM | POA: Diagnosis not present

## 2020-01-07 DIAGNOSIS — F0280 Dementia in other diseases classified elsewhere without behavioral disturbance: Secondary | ICD-10-CM | POA: Diagnosis not present

## 2020-01-07 DIAGNOSIS — I10 Essential (primary) hypertension: Secondary | ICD-10-CM | POA: Diagnosis not present

## 2020-01-13 DIAGNOSIS — E1165 Type 2 diabetes mellitus with hyperglycemia: Secondary | ICD-10-CM | POA: Diagnosis not present

## 2020-01-13 DIAGNOSIS — G309 Alzheimer's disease, unspecified: Secondary | ICD-10-CM | POA: Diagnosis not present

## 2020-01-13 DIAGNOSIS — F0280 Dementia in other diseases classified elsewhere without behavioral disturbance: Secondary | ICD-10-CM | POA: Diagnosis not present

## 2020-01-13 DIAGNOSIS — S3091XD Unspecified superficial injury of lower back and pelvis, subsequent encounter: Secondary | ICD-10-CM | POA: Diagnosis not present

## 2020-01-13 DIAGNOSIS — U071 COVID-19: Secondary | ICD-10-CM | POA: Diagnosis not present

## 2020-01-13 DIAGNOSIS — M6281 Muscle weakness (generalized): Secondary | ICD-10-CM | POA: Diagnosis not present

## 2020-01-18 ENCOUNTER — Encounter: Payer: Self-pay | Admitting: Family Medicine

## 2020-01-18 ENCOUNTER — Telehealth (INDEPENDENT_AMBULATORY_CARE_PROVIDER_SITE_OTHER): Payer: Medicare Other | Admitting: Family Medicine

## 2020-01-18 ENCOUNTER — Other Ambulatory Visit: Payer: Self-pay

## 2020-01-18 ENCOUNTER — Other Ambulatory Visit (INDEPENDENT_AMBULATORY_CARE_PROVIDER_SITE_OTHER): Payer: Medicare Other

## 2020-01-18 ENCOUNTER — Telehealth: Payer: Self-pay | Admitting: Family Medicine

## 2020-01-18 VITALS — BP 122/64 | HR 73 | Temp 98.0°F

## 2020-01-18 DIAGNOSIS — I2510 Atherosclerotic heart disease of native coronary artery without angina pectoris: Secondary | ICD-10-CM

## 2020-01-18 DIAGNOSIS — E11621 Type 2 diabetes mellitus with foot ulcer: Secondary | ICD-10-CM

## 2020-01-18 DIAGNOSIS — L97509 Non-pressure chronic ulcer of other part of unspecified foot with unspecified severity: Secondary | ICD-10-CM

## 2020-01-18 DIAGNOSIS — R410 Disorientation, unspecified: Secondary | ICD-10-CM

## 2020-01-18 DIAGNOSIS — M545 Low back pain, unspecified: Secondary | ICD-10-CM

## 2020-01-18 LAB — POC URINALSYSI DIPSTICK (AUTOMATED)
Bilirubin, UA: NEGATIVE
Blood, UA: NEGATIVE
Glucose, UA: NEGATIVE
Ketones, UA: NEGATIVE
Leukocytes, UA: NEGATIVE
Nitrite, UA: NEGATIVE
Protein, UA: NEGATIVE
Spec Grav, UA: 1.015 (ref 1.010–1.025)
Urobilinogen, UA: 0.2 E.U./dL
pH, UA: 7.5 (ref 5.0–8.0)

## 2020-01-18 NOTE — Telephone Encounter (Signed)
Spoke to Dr. Yong Channel and patient's husband. We will do virtual at 11:40 today. Will be added to schedule now.

## 2020-01-18 NOTE — Telephone Encounter (Signed)
Patient daughter calling to confirm if Dr. Yong Channel received the image that she sent over today and would like to know would her mother need to come in to be seen for this issue.Please return patient call [name] [phone]

## 2020-01-18 NOTE — Progress Notes (Signed)
Phone [phone] Virtual visit via Video note   Subjective:  Chief complaint: Chief Complaint  Patient presents with  . Urinary Tract Infection    symptoms started Sunday, unable unable to sleep, denies burning or frequency, states it will be very difficult to bring in urine sample    This visit type was conducted due to national recommendations for restrictions regarding the COVID-19 Pandemic (e.g. social distancing).  This format is felt to be most appropriate for this patient at this time balancing risks to patient and risks to population by having him in for in person visit.  No physical exam was performed (except for noted visual exam or audio findings with Telehealth visits).    Our team/I connected with [name] at 11:40 AM EDT by a video enabled telemedicine application ([email or web address] or caregility through epic) and verified that I am speaking with the correct person using two identifiers.  Location patient: [facility] Location provider: [facility], office Persons participating in the virtual visit:  patient  Our team/I discussed the limitations of evaluation and management by telemedicine and the availability of in person appointments. In light of current covid-19 pandemic, patient also understands that we are trying to protect them by minimizing in office contact if at all possible.  The patient expressed consent for telemedicine visit and agreed to proceed. Patient understands insurance will be billed.   Past Medical History-  Patient Active Problem List   Diagnosis Date Noted  . COVID-19 virus infection 10/23/2019    Priority: High  . History of DVT in adulthood 06/26/2017    Priority: High  . Coronary artery calcification seen on CAT scan 08/28/2016    Priority: High  . Alzheimer's disease ([place]) 01/04/2016    Priority: High  . Diabetes ([place]) 12/18/2006    Priority: High  . Atherosclerosis of native arteries of extremity with intermittent claudication ([place]) 12/18/2006   Priority: High  . Hyperlipidemia associated with type 2 diabetes mellitus ([place]) 10/30/2019    Priority: Medium  . Osteoporosis 07/02/2018    Priority: Medium  . Generalized weakness 05/02/2018    Priority: Medium  . Pancreas cyst 07/02/2015    Priority: Medium  . Asthma, chronic 03/16/2015    Priority: Medium  . Primary open angle glaucoma of left eye, moderate stage 12/25/2014    Priority: Medium  . Hypertension associated with diabetes ([place]) 12/18/2006    Priority: Medium  . Aortic atherosclerosis ([place]) 10/30/2019    Priority: Low  . Bradycardia with 51-60 beats per minute 10/24/2019    Priority: Low  . Protein-calorie malnutrition ([place]) 07/21/2018    Priority: Low  . GERD (gastroesophageal reflux disease) 05/02/2018    Priority: Low  . LBBB (left bundle branch block) 10/09/2016    Priority: Low  . Pleural effusion, left 09/23/2016    Priority: Low  . Pseudophakia of both eyes 02/15/2013    Priority: Low  . Anemia 05/25/2012    Priority: Low  . Astigmatism 04/29/2012    Priority: Low  . Allergic rhinitis 10/30/2010    Priority: Low  . Venous (peripheral) insufficiency 04/13/2007    Priority: Low  . OSTEOARTHRITIS OF SPINE, NOS 12/18/2006    Priority: Low  . Encephalopathy 11/04/2019  . Pressure injury of skin 11/04/2019  . Hyperglycemia 11/03/2019  . Diabetic ulcer of foot with fat layer exposed ([place]) 05/14/2018    Medications- reviewed and updated Current Outpatient Medications  Medication Sig Dispense Refill  . acetaminophen (TYLENOL) 500 MG tablet Take 500 mg by mouth  every 8 (eight) hours as needed.    Marland Kitchen albuterol (PROAIR HFA) 108 (90 Base) MCG/ACT inhaler 2 PUFFS EVERY 6 HOURS AS NEEDED FOR SHORTNESS OF BREATH. (Patient taking differently: Inhale 2 puffs into the lungs every 6 (six) hours as needed for wheezing or shortness of breath. ) 17 g 5  . albuterol (PROVENTIL) (2.5 MG/3ML) 0.083% nebulizer solution USE THREE MILLILITERS VIA NEBULIZATION BY MOUTH EVERY  6 HOURS AS NEEDED FOR WHEEZING OR SHORTNESS OF BREATH (Patient taking differently: Take 2.5 mg by nebulization daily. ) 300 mL 1  . amLODipine (NORVASC) 10 MG tablet Take 1 tablet (10 mg total) by mouth daily. 90 tablet 1  . ascorbic acid (VITAMIN C) 500 MG tablet Take 500 mg by mouth 2 (two) times daily.    Marland Kitchen aspirin 81 MG tablet Take 81 mg by mouth daily.    . brimonidine (ALPHAGAN P) 0.1 % SOLN Place 1 drop into both eyes 2 (two) times daily.     . budesonide (PULMICORT) 0.25 MG/2ML nebulizer solution Take 2 mLs (0.25 mg total) by nebulization daily. ICD10 Code-J45.909 (Patient taking differently: Take 0.25 mg by nebulization at bedtime. ICD10 Code-J45.909) 60 mL 12  . calcium carbonate (OS-CAL) 600 MG TABS Take 600 mg by mouth 2 (two) times daily with a meal.    . citalopram (CELEXA) 10 MG tablet TAKE 1 TABLET ONCE DAILY. (Patient taking differently: Take 10 mg by mouth daily. ) 90 tablet 0  . dexamethasone (DECADRON) 4 MG tablet     . donepezil (ARICEPT) 10 MG tablet TAKE ONE TABLET AT BEDTIME. (Patient taking differently: Take 10 mg by mouth at bedtime. ) 90 tablet 3  . famotidine (PEPCID) 20 MG tablet TAKE ONE TABLET TWICE A DAY AS NEEDED FOR HEARTBURN OR INDIGESTION. 120 tablet 3  . ferrous sulfate 325 (65 FE) MG EC tablet Take 325 mg by mouth daily with breakfast.     . K Phos Mono-Sod Phos Di & Mono (PHOSPHA 250 NEUTRAL) 155-852-130 MG TABS TAKE 1 TABLET BY MOUTH DAILY. 30 tablet 0  . memantine (NAMENDA) 10 MG tablet TAKE 1 TABLET TWICE DAILY FOR MEMORY. (Patient taking differently: Take 10 mg by mouth 2 (two) times daily. ) 180 tablet 3  . metFORMIN (GLUCOPHAGE) 500 MG tablet Take 1 tablet (500 mg total) by mouth 2 (two) times daily with a meal. 180 tablet 1  . Multiple Vitamin (MULTIVITAMIN WITH MINERALS) TABS tablet Take 1 tablet by mouth daily.    Marland Kitchen senna-docusate (SENOKOT-S) 8.6-50 MG tablet Take 1 tablet by mouth 2 (two) times daily between meals as needed for mild constipation. 60  tablet 0  . traMADol (ULTRAM) 50 MG tablet Take 25 mg by mouth every 6 (six) hours as needed. 1/2 tab (25mg ) q 8hr prn     No current facility-administered medications for this visit.     Objective:  BP 122/64   Pulse 73   Temp 98 F (36.7 C)   LMP  (LMP Unknown)   SpO2 95%  self reported vitals Gen: NAD, appears fatigued sitting next to family- does not speak much during visit today Lungs: nonlabored, normal respiratory rate  Skin: appears dry, no obvious rash      Assessment and Plan   #Hallucinations S: Patients symptoms started on Sunday. Not sleeping at all since Sunday. Starting to act slightly sleepy today- wanting to go bed. No complaints of burning with peeing. Does have some low back pain and caretaker feels like may have more frequent  urination or higher volume- but then again seems to be drinking more fluids. Ultimately still Hard to tell if urinating more frequently as wears an adult diaper. No lower abdomen pain. She has been asking for water more than normal. Blood sugar yesterday was 174 after eating. Last a1c was 6.9.  She is having hallucinations- thinks her daughter is getting married and picked out a dress for her- rather complex. Patient thought her daughters pocket book was moving. Frequently talking.   hallucinations seem to be stable over last day- not worsening in frequency.   Family has had a hard time to get her on the toilet. Not sure they can get urine sample though they have a hat and cup at home.   Mild sores on back and feet. Back pain for about a week. No chest pain or shortness of breath. No abdominal pain. No fevers. No new weakness.   A/P: 86 year old female with advanced Alzheimer's dementia now with hallucinations.  Also may have some increased thirst, urinary frequency, low back pain-they agree to try to bring her by for blood work as well as UA and urine culture-I would really like to have these on hand before starting antibiotics -They are not  sure if they can get by today but more likely will be tomorrow-after I get UA if any concerns for infection I will send in antibiotic -I am also concerned about potential electrolyte abnormalities and we also discussed any minor trigger at her age with her level of dementia can tip her into delirium -If any new or worsening symptoms should seek care in the hospital -Had low phosphate in the past and had normalized on repletion-.  She would have run out based on refills-we will check phosphate levels as well -They are aware that hemoglobin A1c may not be covered as has been under 3 months but they prefer to get this done-I thought it would be reasonable to check this to make sure she has not had a significant increase in blood sugar levels on Metformin -oxygen levels ok and no reported respiratory issues- doubt pneumonia. No chest pani or SOB to suggest CAD as cause.   Lab/Order associations:   ICD-10-CM   1. Delirium  R41.0 POCT Urinalysis Dipstick (Automated)    Urine Culture  2. Low back pain, unspecified back pain laterality, unspecified chronicity, unspecified whether sciatica present  M54.5 POCT Urinalysis Dipstick (Automated)    Urine Culture  3. Type 2 diabetes mellitus with foot ulcer, without long-term current use of insulin (HCC)  E11.621 CBC with Differential/Platelet   L97.509 Comprehensive metabolic panel    Hemoglobin A1c  4. Low phosphate levels  E83.39   5. Hypophosphatemia  E83.39 Phosphorus    Return precautions advised.  Garret Reddish, MD

## 2020-01-18 NOTE — Telephone Encounter (Signed)
Patient husband called in and stated that the patient hasn't slept since Sunday night and she is hallucination and talking a lot. Patients husband thinks she has a UTI again and want to see if Dr. Yong Channel will prescribe something for her. Please advise.

## 2020-01-19 ENCOUNTER — Ambulatory Visit (INDEPENDENT_AMBULATORY_CARE_PROVIDER_SITE_OTHER): Payer: Medicare Other

## 2020-01-19 ENCOUNTER — Other Ambulatory Visit: Payer: Self-pay | Admitting: Family Medicine

## 2020-01-19 ENCOUNTER — Other Ambulatory Visit: Payer: Self-pay

## 2020-01-19 ENCOUNTER — Encounter: Payer: Self-pay | Admitting: Family Medicine

## 2020-01-19 ENCOUNTER — Ambulatory Visit (INDEPENDENT_AMBULATORY_CARE_PROVIDER_SITE_OTHER): Payer: Medicare Other | Admitting: Family Medicine

## 2020-01-19 ENCOUNTER — Other Ambulatory Visit: Payer: Medicare Other

## 2020-01-19 VITALS — BP 124/58 | HR 67 | Temp 98.6°F | Ht 64.0 in | Wt 123.0 lb

## 2020-01-19 DIAGNOSIS — E11621 Type 2 diabetes mellitus with foot ulcer: Secondary | ICD-10-CM

## 2020-01-19 DIAGNOSIS — L97509 Non-pressure chronic ulcer of other part of unspecified foot with unspecified severity: Secondary | ICD-10-CM

## 2020-01-19 DIAGNOSIS — M5136 Other intervertebral disc degeneration, lumbar region: Secondary | ICD-10-CM | POA: Diagnosis not present

## 2020-01-19 DIAGNOSIS — M533 Sacrococcygeal disorders, not elsewhere classified: Secondary | ICD-10-CM | POA: Diagnosis not present

## 2020-01-19 DIAGNOSIS — M4316 Spondylolisthesis, lumbar region: Secondary | ICD-10-CM | POA: Diagnosis not present

## 2020-01-19 DIAGNOSIS — I2510 Atherosclerotic heart disease of native coronary artery without angina pectoris: Secondary | ICD-10-CM

## 2020-01-19 LAB — COMPREHENSIVE METABOLIC PANEL
ALT: 12 U/L (ref 0–35)
AST: 19 U/L (ref 0–37)
Albumin: 3.7 g/dL (ref 3.5–5.2)
Alkaline Phosphatase: 63 U/L (ref 39–117)
BUN: 18 mg/dL (ref 6–23)
CO2: 29 mEq/L (ref 19–32)
Calcium: 10.1 mg/dL (ref 8.4–10.5)
Chloride: 100 mEq/L (ref 96–112)
Creatinine, Ser: 0.79 mg/dL (ref 0.40–1.20)
GFR: 68.87 mL/min (ref >60.00)
Glucose, Bld: 148 mg/dL — ABNORMAL HIGH (ref 70–99)
Potassium: 4.4 mEq/L (ref 3.5–5.1)
Sodium: 138 mEq/L (ref 135–145)
Total Bilirubin: 0.5 mg/dL (ref 0.2–1.2)
Total Protein: 6.5 g/dL (ref 6.0–8.3)

## 2020-01-19 LAB — CBC WITH DIFFERENTIAL/PLATELET
Basophils Absolute: 0 10*3/uL (ref 0.0–0.1)
Basophils Relative: 0.5 % (ref 0.0–3.0)
Eosinophils Absolute: 0.1 10*3/uL (ref 0.0–0.7)
Eosinophils Relative: 1.7 % (ref 0.0–5.0)
HCT: 41.2 % (ref 36.0–46.0)
Hemoglobin: 14.1 g/dL (ref 12.0–15.0)
Lymphocytes Relative: 25.2 % (ref 12.0–46.0)
Lymphs Abs: 1.8 10*3/uL (ref 0.7–4.0)
MCHC: 34.2 g/dL (ref 30.0–36.0)
MCV: 97.6 fl (ref 78.0–100.0)
Monocytes Absolute: 0.4 10*3/uL (ref 0.1–1.0)
Monocytes Relative: 6.1 % (ref 3.0–12.0)
Neutro Abs: 4.8 10*3/uL (ref 1.4–7.7)
Neutrophils Relative %: 66.5 % (ref 43.0–77.0)
Platelets: 357 10*3/uL (ref 150.0–400.0)
RBC: 4.22 Mil/uL (ref 3.87–5.11)
RDW: 13.7 % (ref 11.5–15.5)
WBC: 7.2 10*3/uL (ref 4.0–10.5)

## 2020-01-19 LAB — HEMOGLOBIN A1C: Hgb A1c MFr Bld: 6.1 % (ref 4.6–6.5)

## 2020-01-19 LAB — PHOSPHORUS: Phosphorus: 3.6 mg/dL (ref 2.3–4.6)

## 2020-01-19 NOTE — Patient Instructions (Addendum)
Health Maintenance Due  Topic Date Due  . OPHTHALMOLOGY EXAM  Would like to pass  05/19/2019  . FOOT EXAM declined today - more acute concerns 07/25/2019  . URINE MICROALBUMIN will order at next visit  09/24/2019   Please stop by x-ray before you go If you do not have mychart- we will call you about results within 5 business days of [place] receiving them.  If you have mychart- we will send your results within 3 business days of [place] receiving them.  If abnormal or we want to clarify a result, we will call or mychart you to make sure you receive the message.  If you have questions or concerns or don't hear within 5 business days, please send [place] a message or call us.   We may need further imaging with ct or ultrasound depending on findings. I am considering sports med vs. Ortho vs. General surgery as well depending on findings.  Recommended follow up: as needed for acute concern

## 2020-01-19 NOTE — Progress Notes (Signed)
Phone [phone] In person visit   Subjective:   [name] is a 86 y.o. year old very pleasant female patient who presents for/with See problem oriented charting Chief Complaint  Patient presents with  . bed sore    This visit occurred during the SARS-CoV-2 public health emergency.  Safety protocols were in place, including screening questions prior to the visit, additional usage of staff PPE, and extensive cleaning of exam room while observing appropriate contact time as indicated for disinfecting solutions.   Past Medical History-  Patient Active Problem List   Diagnosis Date Noted  . COVID-19 virus infection 10/23/2019    Priority: High  . History of DVT in adulthood 06/26/2017    Priority: High  . Coronary artery calcification seen on CAT scan 08/28/2016    Priority: High  . Alzheimer's disease ([place]) 01/04/2016    Priority: High  . Diabetes ([place]) 12/18/2006    Priority: High  . Atherosclerosis of native arteries of extremity with intermittent claudication ([place]) 12/18/2006    Priority: High  . Hyperlipidemia associated with type 2 diabetes mellitus ([place]) 10/30/2019    Priority: Medium  . Osteoporosis 07/02/2018    Priority: Medium  . Generalized weakness 05/02/2018    Priority: Medium  . Pancreas cyst 07/02/2015    Priority: Medium  . Asthma, chronic 03/16/2015    Priority: Medium  . Primary open angle glaucoma of left eye, moderate stage 12/25/2014    Priority: Medium  . Hypertension associated with diabetes ([place]) 12/18/2006    Priority: Medium  . Aortic atherosclerosis ([place]) 10/30/2019    Priority: Low  . Bradycardia with 51-60 beats per minute 10/24/2019    Priority: Low  . Protein-calorie malnutrition ([place]) 07/21/2018    Priority: Low  . GERD (gastroesophageal reflux disease) 05/02/2018    Priority: Low  . LBBB (left bundle branch block) 10/09/2016    Priority: Low  . Pleural effusion, left 09/23/2016    Priority: Low  . Pseudophakia of both eyes  02/15/2013    Priority: Low  . Anemia 05/25/2012    Priority: Low  . Astigmatism 04/29/2012    Priority: Low  . Allergic rhinitis 10/30/2010    Priority: Low  . Venous (peripheral) insufficiency 04/13/2007    Priority: Low  . OSTEOARTHRITIS OF SPINE, NOS 12/18/2006    Priority: Low  . Encephalopathy 11/04/2019  . Pressure injury of skin 11/04/2019  . Hyperglycemia 11/03/2019  . Diabetic ulcer of foot with fat layer exposed ([place]) 05/14/2018    Medications- reviewed and updated Current Outpatient Medications  Medication Sig Dispense Refill  . acetaminophen (TYLENOL) 500 MG tablet Take 500 mg by mouth every 8 (eight) hours as needed.    Marland Kitchen albuterol (PROAIR HFA) 108 (90 Base) MCG/ACT inhaler 2 PUFFS EVERY 6 HOURS AS NEEDED FOR SHORTNESS OF BREATH. (Patient taking differently: Inhale 2 puffs into the lungs every 6 (six) hours as needed for wheezing or shortness of breath. ) 17 g 5  . albuterol (PROVENTIL) (2.5 MG/3ML) 0.083% nebulizer solution USE THREE MILLILITERS VIA NEBULIZATION BY MOUTH EVERY 6 HOURS AS NEEDED FOR WHEEZING OR SHORTNESS OF BREATH (Patient taking differently: Take 2.5 mg by nebulization daily. ) 300 mL 1  . amLODipine (NORVASC) 10 MG tablet Take 1 tablet (10 mg total) by mouth daily. 90 tablet 1  . ascorbic acid (VITAMIN C) 500 MG tablet Take 500 mg by mouth 2 (two) times daily.    Marland Kitchen aspirin 81 MG tablet Take 81 mg by mouth daily.    Marland Kitchen  brimonidine (ALPHAGAN P) 0.1 % SOLN Place 1 drop into both eyes 2 (two) times daily.     . budesonide (PULMICORT) 0.25 MG/2ML nebulizer solution Take 2 mLs (0.25 mg total) by nebulization daily. ICD10 Code-J45.909 (Patient taking differently: Take 0.25 mg by nebulization at bedtime. ICD10 Code-J45.909) 60 mL 12  . calcium carbonate (OS-CAL) 600 MG TABS Take 600 mg by mouth 2 (two) times daily with a meal.    . citalopram (CELEXA) 10 MG tablet TAKE 1 TABLET ONCE DAILY. (Patient taking differently: Take 10 mg by mouth daily. ) 90 tablet 0  .  dexamethasone (DECADRON) 4 MG tablet     . donepezil (ARICEPT) 10 MG tablet TAKE ONE TABLET AT BEDTIME. (Patient taking differently: Take 10 mg by mouth at bedtime. ) 90 tablet 3  . famotidine (PEPCID) 20 MG tablet TAKE ONE TABLET TWICE A DAY AS NEEDED FOR HEARTBURN OR INDIGESTION. 120 tablet 3  . ferrous sulfate 325 (65 FE) MG EC tablet Take 325 mg by mouth daily with breakfast.     . memantine (NAMENDA) 10 MG tablet TAKE 1 TABLET TWICE DAILY FOR MEMORY. (Patient taking differently: Take 10 mg by mouth 2 (two) times daily. ) 180 tablet 3  . metFORMIN (GLUCOPHAGE) 500 MG tablet Take 1 tablet (500 mg total) by mouth 2 (two) times daily with a meal. 180 tablet 1  . Multiple Vitamin (MULTIVITAMIN WITH MINERALS) TABS tablet Take 1 tablet by mouth daily.    Marland Kitchen senna-docusate (SENOKOT-S) 8.6-50 MG tablet Take 1 tablet by mouth 2 (two) times daily between meals as needed for mild constipation. 60 tablet 0  . traMADol (ULTRAM) 50 MG tablet Take 25 mg by mouth every 6 (six) hours as needed. 1/2 tab (25mg ) q 8hr prn    . K Phos Mono-Sod Phos Di & Mono (PHOSPHA 250 NEUTRAL) 155-852-130 MG TABS TAKE 1 TABLET BY MOUTH DAILY. 30 tablet 0   No current facility-administered medications for this visit.     Objective:  BP (!) 124/58   Pulse 67   Temp 98.6 F (37 C) (Temporal)   Ht 5\' 4"  (1.626 m)   Wt 123 lb (55.8 kg)   LMP  (LMP Unknown)   SpO2 95%   BMI 21.11 kg/m  Gen: NAD, resting comfortably CV: RRR no murmurs rubs or gallops Lungs: CTAB no crackles, wheeze, rhonchi Ext: no edema Skin: warm, dry MSK: Over sacrum area is at least 3 x 5 cm and is very firm to touch and almost oval-shaped-it is immobile.  Lower than this patient does have a stage II pressure ulcer but it is under 2 x 2 cm and does not seem contiguous with the area of her sacrum.  Patient is not tender to touch in any of these areas.  No fluctuance noted over the [place] area    Assessment and Plan   #Altered mental status with  complaints of hallucinations #Mass of her sacrum S: Patient was seen yesterday by virtual visit.  Family was concerned about UTI due to her not sleeping for several days and having hallucinations-see note from yesterday.  Of note patient did sleep 5 to 6 hours last night and still appears tired but is less talkative and fever hallucinations reported.  Ultimately they were able to drop off a urine but UA was reassuring.  We did get a urine culture.  There was some low back pain reported as well as perhaps polyuria that she wears adult diapers so was difficult to tell.  After visit family contacted me with pictures of enlarged firm area over the sacrum.  Apparently patient receives a sponge bath on a regular basis and this had just popped up within 24 hours-area is at least 3 x 5 cm and is very firm to touch and almost oval-shaped A/P: Patient with altered mental status and prior hallucinations-with growth over low back I was concerned about an abscess based off of the pictures-on exam today this seems much more firm and no obvious fluctuance-incision and drainage I do not think would be beneficial.  We will get plain films to see if it is a bony abnormality.  Depending on read may need to get an ultrasound of the area for further work-up with orthopedics or sports medicine-once again depends on the read -With altered mental status we had ordered labs yesterday and she agrees to complete these today.  Did also add phosphorus as this had been low previously and no longer on supplementation -Since family had also reported increased thirst we also decided to get an updated A1c though it has been less than 3 months-they are okay with pain-to make sure no significant A1c elevation  Recommended follow up: As needed for acute concern  Lab/Order associations:   ICD-10-CM   1. Mass of sacrum  M53.3 DG Lumbar Spine 2-3 Views    DG Lumbar Spine 2-3 Views    CANCELED: DG Sacrum/Coccyx    CANCELED: DG  Sacrum/Coccyx  2. Hypophosphatemia  E83.39 Phosphorus  3. Type 2 diabetes mellitus with foot ulcer, without long-term current use of insulin (HCC)  E11.621 Hemoglobin A1c   L97.509 Comprehensive metabolic panel    CBC with Differential/Platelet   Time Spent: 31 minutes of total time (12:17 PM- 12:39 PM. 4:20 PM-4:29 PM) was spent on the date of the encounter performing the following actions: chart review prior to seeing the patient, obtaining history, performing a medically necessary exam, counseling on the treatment plan, placing orders, and documenting in our EHR.   Return precautions advised.  Garret Reddish, MD

## 2020-01-19 NOTE — Addendum Note (Signed)
Addended by: Marin Olp on: 01/19/2020 06:44 PM   Modules accepted: Orders

## 2020-01-19 NOTE — Telephone Encounter (Signed)
Per Bevelyn Ngo patient was scheduled 01/19/20 at 11:40am.

## 2020-01-20 DIAGNOSIS — M6281 Muscle weakness (generalized): Secondary | ICD-10-CM | POA: Diagnosis not present

## 2020-01-20 DIAGNOSIS — F0280 Dementia in other diseases classified elsewhere without behavioral disturbance: Secondary | ICD-10-CM | POA: Diagnosis not present

## 2020-01-20 DIAGNOSIS — S3091XD Unspecified superficial injury of lower back and pelvis, subsequent encounter: Secondary | ICD-10-CM | POA: Diagnosis not present

## 2020-01-20 DIAGNOSIS — G309 Alzheimer's disease, unspecified: Secondary | ICD-10-CM | POA: Diagnosis not present

## 2020-01-20 DIAGNOSIS — E1165 Type 2 diabetes mellitus with hyperglycemia: Secondary | ICD-10-CM | POA: Diagnosis not present

## 2020-01-20 DIAGNOSIS — U071 COVID-19: Secondary | ICD-10-CM | POA: Diagnosis not present

## 2020-01-20 LAB — URINE CULTURE
MICRO NUMBER:: 10307695
SPECIMEN QUALITY:: ADEQUATE

## 2020-01-21 ENCOUNTER — Ambulatory Visit
Admission: RE | Admit: 2020-01-21 | Discharge: 2020-01-21 | Disposition: A | Discharge status: Home / Self Care | Payer: Medicare Other | Source: Ambulatory Visit | Attending: Family Medicine | Admitting: Family Medicine

## 2020-01-21 DIAGNOSIS — M533 Sacrococcygeal disorders, not elsewhere classified: Secondary | ICD-10-CM

## 2020-01-21 DIAGNOSIS — R1900 Intra-abdominal and pelvic swelling, mass and lump, unspecified site: Secondary | ICD-10-CM | POA: Diagnosis not present

## 2020-01-23 ENCOUNTER — Other Ambulatory Visit: Payer: Self-pay | Admitting: Family Medicine

## 2020-01-24 DIAGNOSIS — U071 COVID-19: Secondary | ICD-10-CM | POA: Diagnosis not present

## 2020-01-24 DIAGNOSIS — M6281 Muscle weakness (generalized): Secondary | ICD-10-CM | POA: Diagnosis not present

## 2020-01-24 DIAGNOSIS — F0280 Dementia in other diseases classified elsewhere without behavioral disturbance: Secondary | ICD-10-CM | POA: Diagnosis not present

## 2020-01-24 DIAGNOSIS — G309 Alzheimer's disease, unspecified: Secondary | ICD-10-CM | POA: Diagnosis not present

## 2020-01-24 DIAGNOSIS — E1165 Type 2 diabetes mellitus with hyperglycemia: Secondary | ICD-10-CM | POA: Diagnosis not present

## 2020-01-24 DIAGNOSIS — S3091XD Unspecified superficial injury of lower back and pelvis, subsequent encounter: Secondary | ICD-10-CM | POA: Diagnosis not present

## 2020-01-25 ENCOUNTER — Other Ambulatory Visit: Payer: Self-pay | Admitting: Family Medicine

## 2020-01-29 DIAGNOSIS — E1151 Type 2 diabetes mellitus with diabetic peripheral angiopathy without gangrene: Secondary | ICD-10-CM

## 2020-01-29 DIAGNOSIS — S3091XD Unspecified superficial injury of lower back and pelvis, subsequent encounter: Secondary | ICD-10-CM

## 2020-01-29 DIAGNOSIS — F0280 Dementia in other diseases classified elsewhere without behavioral disturbance: Secondary | ICD-10-CM

## 2020-01-29 DIAGNOSIS — Z86718 Personal history of other venous thrombosis and embolism: Secondary | ICD-10-CM

## 2020-01-29 DIAGNOSIS — G309 Alzheimer's disease, unspecified: Secondary | ICD-10-CM | POA: Diagnosis not present

## 2020-01-29 DIAGNOSIS — E1165 Type 2 diabetes mellitus with hyperglycemia: Secondary | ICD-10-CM | POA: Diagnosis not present

## 2020-01-29 DIAGNOSIS — R41841 Cognitive communication deficit: Secondary | ICD-10-CM

## 2020-01-29 DIAGNOSIS — I739 Peripheral vascular disease, unspecified: Secondary | ICD-10-CM

## 2020-01-29 DIAGNOSIS — I10 Essential (primary) hypertension: Secondary | ICD-10-CM

## 2020-01-29 DIAGNOSIS — J45909 Unspecified asthma, uncomplicated: Secondary | ICD-10-CM

## 2020-01-29 DIAGNOSIS — E46 Unspecified protein-calorie malnutrition: Secondary | ICD-10-CM

## 2020-01-29 DIAGNOSIS — M6281 Muscle weakness (generalized): Secondary | ICD-10-CM | POA: Diagnosis not present

## 2020-01-29 DIAGNOSIS — I2510 Atherosclerotic heart disease of native coronary artery without angina pectoris: Secondary | ICD-10-CM

## 2020-01-29 DIAGNOSIS — U070 Vaping-related disorder: Secondary | ICD-10-CM | POA: Diagnosis not present

## 2020-01-29 DIAGNOSIS — M810 Age-related osteoporosis without current pathological fracture: Secondary | ICD-10-CM

## 2020-01-29 DIAGNOSIS — Z7984 Long term (current) use of oral hypoglycemic drugs: Secondary | ICD-10-CM

## 2020-02-05 ENCOUNTER — Other Ambulatory Visit: Payer: Self-pay | Admitting: Family Medicine

## 2020-02-06 DIAGNOSIS — Z86718 Personal history of other venous thrombosis and embolism: Secondary | ICD-10-CM | POA: Diagnosis not present

## 2020-02-06 DIAGNOSIS — U071 COVID-19: Secondary | ICD-10-CM | POA: Diagnosis not present

## 2020-02-06 DIAGNOSIS — G309 Alzheimer's disease, unspecified: Secondary | ICD-10-CM | POA: Diagnosis not present

## 2020-02-06 DIAGNOSIS — I2510 Atherosclerotic heart disease of native coronary artery without angina pectoris: Secondary | ICD-10-CM | POA: Diagnosis not present

## 2020-02-06 DIAGNOSIS — I10 Essential (primary) hypertension: Secondary | ICD-10-CM | POA: Diagnosis not present

## 2020-02-06 DIAGNOSIS — E46 Unspecified protein-calorie malnutrition: Secondary | ICD-10-CM | POA: Diagnosis not present

## 2020-02-06 DIAGNOSIS — E1151 Type 2 diabetes mellitus with diabetic peripheral angiopathy without gangrene: Secondary | ICD-10-CM | POA: Diagnosis not present

## 2020-02-06 DIAGNOSIS — R41841 Cognitive communication deficit: Secondary | ICD-10-CM | POA: Diagnosis not present

## 2020-02-06 DIAGNOSIS — E1165 Type 2 diabetes mellitus with hyperglycemia: Secondary | ICD-10-CM | POA: Diagnosis not present

## 2020-02-06 DIAGNOSIS — M810 Age-related osteoporosis without current pathological fracture: Secondary | ICD-10-CM | POA: Diagnosis not present

## 2020-02-06 DIAGNOSIS — S3091XD Unspecified superficial injury of lower back and pelvis, subsequent encounter: Secondary | ICD-10-CM | POA: Diagnosis not present

## 2020-02-06 DIAGNOSIS — M6281 Muscle weakness (generalized): Secondary | ICD-10-CM | POA: Diagnosis not present

## 2020-02-06 DIAGNOSIS — I739 Peripheral vascular disease, unspecified: Secondary | ICD-10-CM | POA: Diagnosis not present

## 2020-02-06 DIAGNOSIS — J45909 Unspecified asthma, uncomplicated: Secondary | ICD-10-CM | POA: Diagnosis not present

## 2020-02-06 DIAGNOSIS — Z7984 Long term (current) use of oral hypoglycemic drugs: Secondary | ICD-10-CM | POA: Diagnosis not present

## 2020-02-06 DIAGNOSIS — F0280 Dementia in other diseases classified elsewhere without behavioral disturbance: Secondary | ICD-10-CM | POA: Diagnosis not present

## 2020-02-07 DIAGNOSIS — F0280 Dementia in other diseases classified elsewhere without behavioral disturbance: Secondary | ICD-10-CM | POA: Diagnosis not present

## 2020-02-07 DIAGNOSIS — M6281 Muscle weakness (generalized): Secondary | ICD-10-CM | POA: Diagnosis not present

## 2020-02-07 DIAGNOSIS — G309 Alzheimer's disease, unspecified: Secondary | ICD-10-CM | POA: Diagnosis not present

## 2020-02-07 DIAGNOSIS — E1165 Type 2 diabetes mellitus with hyperglycemia: Secondary | ICD-10-CM | POA: Diagnosis not present

## 2020-02-07 DIAGNOSIS — S3091XD Unspecified superficial injury of lower back and pelvis, subsequent encounter: Secondary | ICD-10-CM | POA: Diagnosis not present

## 2020-02-07 DIAGNOSIS — U071 COVID-19: Secondary | ICD-10-CM | POA: Diagnosis not present

## 2020-02-15 DIAGNOSIS — G309 Alzheimer's disease, unspecified: Secondary | ICD-10-CM | POA: Diagnosis not present

## 2020-02-15 DIAGNOSIS — E1165 Type 2 diabetes mellitus with hyperglycemia: Secondary | ICD-10-CM | POA: Diagnosis not present

## 2020-02-15 DIAGNOSIS — M6281 Muscle weakness (generalized): Secondary | ICD-10-CM | POA: Diagnosis not present

## 2020-02-15 DIAGNOSIS — F0280 Dementia in other diseases classified elsewhere without behavioral disturbance: Secondary | ICD-10-CM | POA: Diagnosis not present

## 2020-02-15 DIAGNOSIS — U071 COVID-19: Secondary | ICD-10-CM | POA: Diagnosis not present

## 2020-02-15 DIAGNOSIS — S3091XD Unspecified superficial injury of lower back and pelvis, subsequent encounter: Secondary | ICD-10-CM | POA: Diagnosis not present

## 2020-02-23 DIAGNOSIS — M6281 Muscle weakness (generalized): Secondary | ICD-10-CM | POA: Diagnosis not present

## 2020-02-23 DIAGNOSIS — U071 COVID-19: Secondary | ICD-10-CM | POA: Diagnosis not present

## 2020-02-23 DIAGNOSIS — S3091XD Unspecified superficial injury of lower back and pelvis, subsequent encounter: Secondary | ICD-10-CM | POA: Diagnosis not present

## 2020-02-23 DIAGNOSIS — G309 Alzheimer's disease, unspecified: Secondary | ICD-10-CM | POA: Diagnosis not present

## 2020-02-23 DIAGNOSIS — F0280 Dementia in other diseases classified elsewhere without behavioral disturbance: Secondary | ICD-10-CM | POA: Diagnosis not present

## 2020-02-23 DIAGNOSIS — E1165 Type 2 diabetes mellitus with hyperglycemia: Secondary | ICD-10-CM | POA: Diagnosis not present

## 2020-02-29 DIAGNOSIS — G309 Alzheimer's disease, unspecified: Secondary | ICD-10-CM | POA: Diagnosis not present

## 2020-02-29 DIAGNOSIS — U071 COVID-19: Secondary | ICD-10-CM | POA: Diagnosis not present

## 2020-02-29 DIAGNOSIS — S3091XD Unspecified superficial injury of lower back and pelvis, subsequent encounter: Secondary | ICD-10-CM | POA: Diagnosis not present

## 2020-02-29 DIAGNOSIS — E1165 Type 2 diabetes mellitus with hyperglycemia: Secondary | ICD-10-CM | POA: Diagnosis not present

## 2020-02-29 DIAGNOSIS — F0280 Dementia in other diseases classified elsewhere without behavioral disturbance: Secondary | ICD-10-CM | POA: Diagnosis not present

## 2020-02-29 DIAGNOSIS — M6281 Muscle weakness (generalized): Secondary | ICD-10-CM | POA: Diagnosis not present

## 2020-03-06 ENCOUNTER — Other Ambulatory Visit: Payer: Self-pay | Admitting: Family Medicine

## 2020-03-07 DIAGNOSIS — U071 COVID-19: Secondary | ICD-10-CM | POA: Diagnosis not present

## 2020-03-09 ENCOUNTER — Telehealth: Payer: Self-pay

## 2020-03-09 NOTE — Telephone Encounter (Signed)
Returned Alicia's call who is the pt daughter and states the pt is dealing with hallucinations and not sleeping at all at night. [name] requested a virtual appointment, transferred to the front for scheduling.

## 2020-03-09 NOTE — Telephone Encounter (Signed)
[name] would like to speak with Dr. Yong Channel or the nurse regarding patient health concern. Megan Putt do not want to provide additional information regarding patients health

## 2020-03-10 ENCOUNTER — Other Ambulatory Visit: Payer: Self-pay

## 2020-03-10 ENCOUNTER — Encounter: Payer: Self-pay | Admitting: Family Medicine

## 2020-03-10 ENCOUNTER — Telehealth (INDEPENDENT_AMBULATORY_CARE_PROVIDER_SITE_OTHER): Payer: Medicare Other | Admitting: Family Medicine

## 2020-03-10 VITALS — BP 116/69 | HR 68 | Ht 64.0 in | Wt 123.0 lb

## 2020-03-10 DIAGNOSIS — I10 Essential (primary) hypertension: Secondary | ICD-10-CM

## 2020-03-10 DIAGNOSIS — G309 Alzheimer's disease, unspecified: Secondary | ICD-10-CM

## 2020-03-10 DIAGNOSIS — I2510 Atherosclerotic heart disease of native coronary artery without angina pectoris: Secondary | ICD-10-CM

## 2020-03-10 DIAGNOSIS — R4182 Altered mental status, unspecified: Secondary | ICD-10-CM | POA: Diagnosis not present

## 2020-03-10 DIAGNOSIS — F0281 Dementia in other diseases classified elsewhere with behavioral disturbance: Secondary | ICD-10-CM

## 2020-03-10 DIAGNOSIS — E1159 Type 2 diabetes mellitus with other circulatory complications: Secondary | ICD-10-CM

## 2020-03-10 DIAGNOSIS — E11621 Type 2 diabetes mellitus with foot ulcer: Secondary | ICD-10-CM | POA: Diagnosis not present

## 2020-03-10 DIAGNOSIS — R443 Hallucinations, unspecified: Secondary | ICD-10-CM | POA: Diagnosis not present

## 2020-03-10 DIAGNOSIS — L97509 Non-pressure chronic ulcer of other part of unspecified foot with unspecified severity: Secondary | ICD-10-CM

## 2020-03-10 MED ORDER — QUETIAPINE FUMARATE 25 MG PO TABS: 12.5000 mg | ORAL_TABLET | Freq: Every day | ORAL | 1 refills | Status: AC

## 2020-03-10 NOTE — Patient Instructions (Addendum)
Health Maintenance Due  Topic Date Due  . COVID-19 Vaccine (1) declined  Never done  . OPHTHALMOLOGY EXAM not in office  05/19/2019  . FOOT EXAM not in office  07/25/2019  . URINE MICROALBUMIN not in office.  09/24/2019

## 2020-03-10 NOTE — Progress Notes (Signed)
Phone [phone] Virtual visit via Video note   Subjective:  Chief complaint: Chief Complaint  Patient presents with  . Hallucinations    not sleeping started sunday    This visit type was conducted due to national recommendations for restrictions regarding the COVID-19 Pandemic (e.g. social distancing).  This format is felt to be most appropriate for this patient at this time balancing risks to patient and risks to population by having him in for in person visit.  No physical exam was performed (except for noted visual exam or audio findings with Telehealth visits).    Our team/I connected with [name] at 10:40 AM EDT by a video enabled telemedicine application (doxy.me or caregility through epic) and verified that I am speaking with the correct person using two identifiers.  Location patient: [facility] Location provider: [facility], office Persons participating in the virtual visit:  patient  Our team/I discussed the limitations of evaluation and management by telemedicine and the availability of in person appointments. In light of current covid-19 pandemic, patient also understands that we are trying to protect them by minimizing in office contact if at all possible.  The patient expressed consent for telemedicine visit and agreed to proceed. Patient understands insurance will be billed.   Past Medical History-  Patient Active Problem List   Diagnosis Date Noted  . COVID-19 virus infection 10/23/2019    Priority: High  . History of DVT in adulthood 06/26/2017    Priority: High  . Coronary artery calcification seen on CAT scan 08/28/2016    Priority: High  . Alzheimer's disease ([place]) 01/04/2016    Priority: High  . Diabetes ([place]) 12/18/2006    Priority: High  . Atherosclerosis of native arteries of extremity with intermittent claudication ([place]) 12/18/2006    Priority: High  . Hyperlipidemia associated with type 2 diabetes mellitus ([place]) 10/30/2019    Priority: Medium  .  Osteoporosis 07/02/2018    Priority: Medium  . Generalized weakness 05/02/2018    Priority: Medium  . Pancreas cyst 07/02/2015    Priority: Medium  . Asthma, chronic 03/16/2015    Priority: Medium  . Primary open angle glaucoma of left eye, moderate stage 12/25/2014    Priority: Medium  . Hypertension associated with diabetes ([place]) 12/18/2006    Priority: Medium  . Aortic atherosclerosis ([place]) 10/30/2019    Priority: Low  . Bradycardia with 51-60 beats per minute 10/24/2019    Priority: Low  . Protein-calorie malnutrition ([place]) 07/21/2018    Priority: Low  . GERD (gastroesophageal reflux disease) 05/02/2018    Priority: Low  . LBBB (left bundle branch block) 10/09/2016    Priority: Low  . Pleural effusion, left 09/23/2016    Priority: Low  . Pseudophakia of both eyes 02/15/2013    Priority: Low  . Anemia 05/25/2012    Priority: Low  . Astigmatism 04/29/2012    Priority: Low  . Allergic rhinitis 10/30/2010    Priority: Low  . Venous (peripheral) insufficiency 04/13/2007    Priority: Low  . OSTEOARTHRITIS OF SPINE, NOS 12/18/2006    Priority: Low  . Encephalopathy 11/04/2019  . Pressure injury of skin 11/04/2019  . Hyperglycemia 11/03/2019  . Diabetic ulcer of foot with fat layer exposed ([place]) 05/14/2018    Medications- reviewed and updated Current Outpatient Medications  Medication Sig Dispense Refill  . acetaminophen (TYLENOL) 500 MG tablet Take 500 mg by mouth every 8 (eight) hours as needed.    Marland Kitchen albuterol (PROAIR HFA) 108 (90 Base) MCG/ACT inhaler  2 PUFFS EVERY 6 HOURS AS NEEDED FOR SHORTNESS OF BREATH. (Patient taking differently: Inhale 2 puffs into the lungs every 6 (six) hours as needed for wheezing or shortness of breath. ) 17 g 5  . albuterol (PROVENTIL) (2.5 MG/3ML) 0.083% nebulizer solution USE THREE MILLILITERS VIA NEBULIZATION BY MOUTH EVERY 6 HOURS AS NEEDED FOR WHEEZING OR SHORTNESS OF BREATH 75 mL 1  . amLODipine (NORVASC) 10 MG tablet Take 1 tablet (10  mg total) by mouth daily. 90 tablet 1  . ascorbic acid (VITAMIN C) 500 MG tablet Take 500 mg by mouth 2 (two) times daily.    Marland Kitchen aspirin 81 MG tablet Take 81 mg by mouth daily.    . brimonidine (ALPHAGAN P) 0.1 % SOLN Place 1 drop into both eyes 2 (two) times daily.     . budesonide (PULMICORT) 0.25 MG/2ML nebulizer solution TAKE 2 MILLILITERS VIA NEBULIZATION DAILY AS DIRECTED 60 mL 11  . calcium carbonate (OS-CAL) 600 MG TABS Take 600 mg by mouth 2 (two) times daily with a meal.    . citalopram (CELEXA) 10 MG tablet TAKE 1 TABLET ONCE DAILY. 90 tablet 3  . dexamethasone (DECADRON) 4 MG tablet     . donepezil (ARICEPT) 10 MG tablet TAKE ONE TABLET AT BEDTIME. (Patient taking differently: Take 10 mg by mouth at bedtime. ) 90 tablet 3  . famotidine (PEPCID) 20 MG tablet TAKE ONE TABLET TWICE A DAY AS NEEDED FOR HEARTBURN OR INDIGESTION. 120 tablet 3  . ferrous sulfate 325 (65 FE) MG EC tablet Take 325 mg by mouth daily with breakfast.     . K Phos Mono-Sod Phos Di & Mono (PHOSPHA 250 NEUTRAL) 155-852-130 MG TABS TAKE 1 TABLET BY MOUTH DAILY. 30 tablet 0  . memantine (NAMENDA) 10 MG tablet TAKE 1 TABLET TWICE DAILY FOR MEMORY. (Patient taking differently: Take 10 mg by mouth 2 (two) times daily. ) 180 tablet 3  . metFORMIN (GLUCOPHAGE) 500 MG tablet Take 1 tablet (500 mg total) by mouth 2 (two) times daily with a meal. 180 tablet 1  . Multiple Vitamin (MULTIVITAMIN WITH MINERALS) TABS tablet Take 1 tablet by mouth daily.    Marland Kitchen senna-docusate (SENOKOT-S) 8.6-50 MG tablet Take 1 tablet by mouth 2 (two) times daily between meals as needed for mild constipation. 60 tablet 0  . traMADol (ULTRAM) 50 MG tablet Take 25 mg by mouth every 6 (six) hours as needed. 1/2 tab (25mg ) q 8hr prn     No current facility-administered medications for this visit.     Objective:  BP 116/69   Pulse 68   Ht 5\' 4"  (1.626 m)   Wt 123 lb (55.8 kg)   LMP  (LMP Unknown)   SpO2 93%   BMI 21.11 kg/m  self reported  vitals Gen: NAD, resting comfortably in her chair, sleepy during visit and has to be woken up  Lungs: nonlabored, normal respiratory rate  Skin: appears dry, no obvious rash     Assessment and Plan   # Hallucinations/altered mental status in patient with baseline dementia S: Started on 5 days ago on Sunday with increased confusion.  She started having hallucinations-Seeing things and talking to people that are not there.   She has not been able to sleep until last night but that was still 5 nights of not sleeping.  This has been very stressful on her husband who recently had a valve replacement.  Similar event in march that lasted 3 days.  Eventually once she  slept her confusion improved and hallucinations resolved.  No UTI was found at that time.   A/P: 86 year old female with Alzheimer's dementia with increased confusion from baseline and hallucinations associated with poor sleep.  It actually seems like poor sleep triggers the altered mental status and hallucinations.  We opted to try a very low-dose of Seroquel-just 12.5 mg to start at bedtime if this recurs and she is not able to sleep for 1 to 2 days.  We discussed increased mortality risk on atypical antipsychotics but we thought the benefits outweigh the risks -Issues likely related to underlying dementia with lower "brain reserve".  We did opt to continue Namenda and Aricept  DDx: -Diabetes is well controlled without medication.  cbgs not elevated  at home- 196 after a meal at its peak-I do not think this is the cause of her confusion - no chest pain or shortness of breath but sedentary-doubt CAD as cause of altered mental status - no abdominal pain, nausea or vomiting - no cough or congestion. Stable chronic runny nose.  No obvious pneumonia -We will get urinalysis and urine culture though I honestly think her issues are primarily sleep-related as above -Not hypo or hypertensive-blood pressure is well controlled on amlodipine  alone-continue current medication  Recommended follow up: For acute concern Return for as needed for new, worsening, persistent symptoms.  For standard follow-up would recommend visit at least every 6 months  Lab/Order associations:   ICD-10-CM   1. Hallucinations  R44.3 POCT Urinalysis Dipstick (Automated)    Urine Culture  2. Altered mental status, unspecified altered mental status type  R41.82 Urine Culture  3. Type 2 diabetes mellitus with foot ulcer, without long-term current use of insulin ([name])  E11.621    L97.509   4. Hypertension associated with diabetes ([place])  E11.59    I10   5. Alzheimer's dementia with behavioral disturbance, unspecified timing of dementia onset ([place])  G30.9    F02.81     Meds ordered this encounter  Medications  . QUEtiapine (SEROQUEL) 25 MG tablet    Sig: Take 0.5-1 tablets (12.5-25 mg total) by mouth at bedtime. As needed for sleep and agitation if not able to sleep for 24-48 hours.    Dispense:  15 tablet    Refill:  1   Return precautions advised.  Garret Reddish, MD

## 2020-03-15 ENCOUNTER — Telehealth: Payer: Self-pay

## 2020-03-15 NOTE — Telephone Encounter (Signed)
(  Late Entry) Telephone call to patient to schedule palliative care visit with patient. Patient/family in agreement with home visit on 03/16/20 @ 10:30am.

## 2020-03-16 ENCOUNTER — Other Ambulatory Visit: Payer: Medicare Other | Admitting: *Deleted

## 2020-03-16 ENCOUNTER — Other Ambulatory Visit: Payer: Medicare Other

## 2020-03-16 ENCOUNTER — Other Ambulatory Visit: Payer: Self-pay

## 2020-03-16 DIAGNOSIS — Z515 Encounter for palliative care: Secondary | ICD-10-CM

## 2020-03-17 NOTE — Progress Notes (Signed)
COMMUNITY PALLIATIVE CARE SW NOTE  PATIENT NAME: [name] DOB: Apr 11, 1931 MRN: [id number]  PRIMARY CARE PROVIDER: Marin Olp, MD  RESPONSIBLE PARTY:  Acct ID - Guarantor Home Phone Work Phone Relationship Acct Type  [id number] - Delossantos,Leea [phone]  Self P/F     [place], [address], [place] [address]     PLAN OF CARE and INTERVENTIONS:             1. GOALS OF CARE/ ADVANCE CARE PLANNING:  Goal is for patient to reman in her home with private caregivers. Patient is a DNR.  2. SOCIAL/EMOTIONAL/SPIRITUAL ASSESSMENT/ INTERVENTIONS:  SW and RN-Monishia Nadara Mustard completed a visit with patient at her home. She was present with her husband-Tom , daughter and sitter-Stacy. Patient was sitting in her wheelchair at the table. She was awake, alert and oriented to self. Patient was appropriate and verbally responsive to simple yes/no questions when prompted. She denied that she was having any pain or discomfort. Patient was observed in an out of sleep throughout the visit. Due to patient's advance dementia, she was unable to provide history or background on herself. Her husband, daughter and sitter provided medical history/status on patient and social history.Patient is dependent for all ADL's, but can feed herself with prompting and cuing. She is wheelchair bound. Patient requires 24 hour care that is done by a hired caregiver during the day and he husband and daughters at night. Her appetite is poor overall.There are no swallowing issues, but does have an occasional cough with food/fluid intake. Her pills are administered crushed in yogurt. Patient's disposition is pleasant and she displays her social graces toward the team. Patient has intermittent hallucinations that last 2/3 days intermittently where she does get sleep. Her family describes it as non-stop talking and conversations with entities not seen by others. Patient has reddened areas on her bottom that have open intermittently overt time and  is treated with barrier cream and use of cushion pads and pillows. Patient has arthritis to her knees and is managed with Tylenol. Unless she is stimulated, patent sleeps up to 12-18 hours per day. Patient has been married to her husband-Tom for 65 years. She has four children (3 girls, 1 boy) and several grandchildren. 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 She musically inclined, where she sings and plays several string instruments. She is of [employer]. Patient's husband is a Scientist, research (life sciences).  Patient has advanced directives: Husband is POA/HCPOA and is a DNR. Patient has not been vaccinated, her husband will consider homebound vaccination program for patient and follow-up with the team. SW provided introduction/education to palliative care services and team, supportive presence, active listening, assessed needs and comfort of patient, coping of PCG, left DNR in the home, provided reassurance of support and reinforced access to support from palliative care team.  3.  4. PATIENT/CAREGIVER EDUCATION/ COPING: Education was provided regarding the palliative care program, role of the team and how to access support. Patient's husband seem to be coping well. Patient and PCG have support of their children and extended family members. They have a strong faith background that is a source of support for them all. The patient and family also note that their sitter-Stacy has been a source of support to them and they are confident in care she provides to patient.  5. PERSONAL EMERGENCY PLAN:  Patient's physician can be contacted by family for concerns, questions or changes in condition. Access to palliative care support was reinforced.  911 can be activated for emergencies.  6. COMMUNITY RESOURCES COORDINATION/ HEALTH CARE NAVIGATION:  Patient has Charity fundraiser services. Family to follow-up with [facility] regarding medication ordered by patient's physician to manage sleep.   7. FINANCIAL/LEGAL CONCERNS/INTERVENTIONS:  No financial or legal issues.      SOCIAL HX:  Social History   Tobacco Use  . Smoking status: Never Smoker  . Smokeless tobacco: Never Used  Substance Use Topics  . Alcohol use: No    CODE STATUS: DNR ADVANCED DIRECTIVES: Yes MOST FORM COMPLETE: No HOSPICE EDUCATION PROVIDED: Yes, difference in hospice and palliative care services.   PPS: Patient is alert and oriented to self. She is dependent for all ADL's. She can ambulate short distances with her walker and assistance. Patient spends most of her day in her wheelchair.   Duration of visit and documentation: 80 minutes      Katheren Puller, LCSW

## 2020-03-20 NOTE — Progress Notes (Signed)
COMMUNITY PALLIATIVE CARE RN NOTE  PATIENT NAME: [name] DOB: [birth year] MRN: [id number]  PRIMARY CARE PROVIDER: Marin Olp, MD  RESPONSIBLE PARTY: Franchot Erichsen (husband) Acct ID - Guarantor Home Phone Work Phone Relationship Acct Type  [id number] - Freimuth,Fiona [phone]  Self P/F     [place], [address], [place] [address]   Covid-19 Pre-screening Negative  PLAN OF CARE and INTERVENTION:  1. ADVANCE CARE PLANNING/GOALS OF CARE: Goal is for patient to remain at home with her husband with 24/7 care. DNR (2 copies) left in the home. 2. PATIENT/CAREGIVER EDUCATION: Explained Palliative care services 3. DISEASE STATUS: Joint visit made with Palliative care SW, M. Lonon. Met with patient, husband, two daughter's (one via video conference) and hired caregiver Marzetta Board. Upon arrival, patient is sitting up in her wheelchair at the dining table awake and alert. Her mood is pleasant. She is able to answer simple questions with short replies and make her needs known. She is intermittently confused. She does experience some hallucinations where she sees people and carries on conversations with them. She also has instances where she will stay awake for 24 hours straight with constant talking. Patient recently had an appointment with her PCP and a medication was called into [facility] to help with this. They have been waiting for this medication to be approved by their insurance. Other times patient sleeps about 12-18 hours/day. Their daughter is going to contact the pharmacy today to follow up. She observed falling asleep off and on throughout visit. She does experience arthritic type pain in her knees and back. She is taking Tylenol 3x/day. She has Tramadol available if needed, but husband has not had to administer this. She requires 1 person assistance with standing, transfers, bathing, dressing and toileting. She is able to ambulate some using an upright rollator with assistance. She is  incontinent of both bowel and bladder and wears adult briefs. They feel that her appetite has declined some and is variable. She takes her medications crushed in yogurt. Her caregiver reports that there are red places on patient's bottom that open on occasion. Areas are currently closed. They are applying barrier cream after each incontinent episode. They rotate her position every 2-3 hours while she is in bed and float her heels on pillows. They are agreeable to future Palliative care visits. Will continue to monitor.  HISTORY OF PRESENT ILLNESS:  This is a 86 yo female with a diagnosis of Alzheimer's dementia. Palliative care team was asked to follow patient for additional community support. Will visit patient monthly and PRN.  CODE STATUS: DNR ADVANCED DIRECTIVES: Y MOST FORM: no PPS: 30%   PHYSICAL EXAM:   VITALS: Today's Vitals   03/16/20 1058  BP: 115/62  Pulse: 84  Resp: 17  Temp: 97.8 F (36.6 C)  TempSrc: Temporal  SpO2: 92%  PainSc: 0-No pain    LUNGS: clear to auscultation  CARDIAC: Cor RRR EXTREMITIES: No edema SKIN: Thin/frail skin  NEURO: Alert and oriented to person/place, intermittent confusion, generalized weakness, ambulatory w/upright rollator and assistance   (Duration of visit and documentation 75 minutes)   Daryl Eastern, RN BSN

## 2020-03-28 ENCOUNTER — Other Ambulatory Visit: Payer: Self-pay | Admitting: Family Medicine

## 2020-04-06 DIAGNOSIS — E1165 Type 2 diabetes mellitus with hyperglycemia: Secondary | ICD-10-CM | POA: Diagnosis not present

## 2020-04-06 DIAGNOSIS — M6281 Muscle weakness (generalized): Secondary | ICD-10-CM | POA: Diagnosis not present

## 2020-04-06 DIAGNOSIS — E46 Unspecified protein-calorie malnutrition: Secondary | ICD-10-CM | POA: Diagnosis not present

## 2020-04-06 DIAGNOSIS — Z86718 Personal history of other venous thrombosis and embolism: Secondary | ICD-10-CM | POA: Diagnosis not present

## 2020-04-06 DIAGNOSIS — U071 COVID-19: Secondary | ICD-10-CM | POA: Diagnosis not present

## 2020-04-06 DIAGNOSIS — I739 Peripheral vascular disease, unspecified: Secondary | ICD-10-CM | POA: Diagnosis not present

## 2020-04-06 DIAGNOSIS — I2510 Atherosclerotic heart disease of native coronary artery without angina pectoris: Secondary | ICD-10-CM | POA: Diagnosis not present

## 2020-04-06 DIAGNOSIS — I10 Essential (primary) hypertension: Secondary | ICD-10-CM | POA: Diagnosis not present

## 2020-04-06 DIAGNOSIS — R41841 Cognitive communication deficit: Secondary | ICD-10-CM | POA: Diagnosis not present

## 2020-04-06 DIAGNOSIS — E1151 Type 2 diabetes mellitus with diabetic peripheral angiopathy without gangrene: Secondary | ICD-10-CM | POA: Diagnosis not present

## 2020-04-06 DIAGNOSIS — G309 Alzheimer's disease, unspecified: Secondary | ICD-10-CM | POA: Diagnosis not present

## 2020-04-06 DIAGNOSIS — Z7984 Long term (current) use of oral hypoglycemic drugs: Secondary | ICD-10-CM | POA: Diagnosis not present

## 2020-04-06 DIAGNOSIS — S3091XD Unspecified superficial injury of lower back and pelvis, subsequent encounter: Secondary | ICD-10-CM | POA: Diagnosis not present

## 2020-04-06 DIAGNOSIS — M810 Age-related osteoporosis without current pathological fracture: Secondary | ICD-10-CM | POA: Diagnosis not present

## 2020-04-06 DIAGNOSIS — F0280 Dementia in other diseases classified elsewhere without behavioral disturbance: Secondary | ICD-10-CM | POA: Diagnosis not present

## 2020-04-06 DIAGNOSIS — J45909 Unspecified asthma, uncomplicated: Secondary | ICD-10-CM | POA: Diagnosis not present

## 2020-04-12 ENCOUNTER — Inpatient Hospital Stay (HOSPITAL_COMMUNITY)
Admission: EM | Admit: 2020-04-12 | Discharge: 2020-04-14 | DRG: 871 | Disposition: A | Discharge status: Home Health | Payer: Medicare Other | Attending: Family Medicine | Admitting: Family Medicine

## 2020-04-12 ENCOUNTER — Emergency Department (HOSPITAL_COMMUNITY): Payer: Medicare Other

## 2020-04-12 ENCOUNTER — Other Ambulatory Visit: Payer: Self-pay

## 2020-04-12 ENCOUNTER — Inpatient Hospital Stay (HOSPITAL_COMMUNITY): Payer: Medicare Other

## 2020-04-12 ENCOUNTER — Encounter (HOSPITAL_COMMUNITY): Payer: Self-pay

## 2020-04-12 DIAGNOSIS — E1169 Type 2 diabetes mellitus with other specified complication: Secondary | ICD-10-CM | POA: Diagnosis present

## 2020-04-12 DIAGNOSIS — E1151 Type 2 diabetes mellitus with diabetic peripheral angiopathy without gangrene: Secondary | ICD-10-CM | POA: Diagnosis present

## 2020-04-12 DIAGNOSIS — B961 Klebsiella pneumoniae [K. pneumoniae] as the cause of diseases classified elsewhere: Secondary | ICD-10-CM | POA: Diagnosis present

## 2020-04-12 DIAGNOSIS — E860 Dehydration: Secondary | ICD-10-CM | POA: Diagnosis not present

## 2020-04-12 DIAGNOSIS — L89616 Pressure-induced deep tissue damage of right heel: Secondary | ICD-10-CM | POA: Diagnosis present

## 2020-04-12 DIAGNOSIS — L89312 Pressure ulcer of right buttock, stage 2: Secondary | ICD-10-CM | POA: Diagnosis present

## 2020-04-12 DIAGNOSIS — L03317 Cellulitis of buttock: Secondary | ICD-10-CM | POA: Diagnosis present

## 2020-04-12 DIAGNOSIS — J189 Pneumonia, unspecified organism: Secondary | ICD-10-CM

## 2020-04-12 DIAGNOSIS — L89011 Pressure ulcer of right elbow, stage 1: Secondary | ICD-10-CM | POA: Diagnosis present

## 2020-04-12 DIAGNOSIS — E46 Unspecified protein-calorie malnutrition: Secondary | ICD-10-CM | POA: Diagnosis present

## 2020-04-12 DIAGNOSIS — G9340 Encephalopathy, unspecified: Secondary | ICD-10-CM

## 2020-04-12 DIAGNOSIS — I10 Essential (primary) hypertension: Secondary | ICD-10-CM

## 2020-04-12 DIAGNOSIS — G9341 Metabolic encephalopathy: Secondary | ICD-10-CM | POA: Diagnosis present

## 2020-04-12 DIAGNOSIS — K5641 Fecal impaction: Secondary | ICD-10-CM | POA: Diagnosis present

## 2020-04-12 DIAGNOSIS — E1159 Type 2 diabetes mellitus with other circulatory complications: Secondary | ICD-10-CM | POA: Diagnosis not present

## 2020-04-12 DIAGNOSIS — Z20822 Contact with and (suspected) exposure to covid-19: Secondary | ICD-10-CM | POA: Diagnosis present

## 2020-04-12 DIAGNOSIS — E441 Mild protein-calorie malnutrition: Secondary | ICD-10-CM | POA: Diagnosis not present

## 2020-04-12 DIAGNOSIS — E43 Unspecified severe protein-calorie malnutrition: Secondary | ICD-10-CM | POA: Diagnosis present

## 2020-04-12 DIAGNOSIS — K5730 Diverticulosis of large intestine without perforation or abscess without bleeding: Secondary | ICD-10-CM | POA: Diagnosis present

## 2020-04-12 DIAGNOSIS — I70211 Atherosclerosis of native arteries of extremities with intermittent claudication, right leg: Secondary | ICD-10-CM | POA: Diagnosis not present

## 2020-04-12 DIAGNOSIS — N390 Urinary tract infection, site not specified: Secondary | ICD-10-CM | POA: Diagnosis present

## 2020-04-12 DIAGNOSIS — L89150 Pressure ulcer of sacral region, unstageable: Secondary | ICD-10-CM | POA: Diagnosis not present

## 2020-04-12 DIAGNOSIS — G309 Alzheimer's disease, unspecified: Secondary | ICD-10-CM | POA: Diagnosis present

## 2020-04-12 DIAGNOSIS — Z7401 Bed confinement status: Secondary | ICD-10-CM | POA: Diagnosis not present

## 2020-04-12 DIAGNOSIS — R6520 Severe sepsis without septic shock: Secondary | ICD-10-CM | POA: Diagnosis present

## 2020-04-12 DIAGNOSIS — L97509 Non-pressure chronic ulcer of other part of unspecified foot with unspecified severity: Secondary | ICD-10-CM

## 2020-04-12 DIAGNOSIS — M2550 Pain in unspecified joint: Secondary | ICD-10-CM | POA: Diagnosis not present

## 2020-04-12 DIAGNOSIS — Z7984 Long term (current) use of oral hypoglycemic drugs: Secondary | ICD-10-CM

## 2020-04-12 DIAGNOSIS — E1122 Type 2 diabetes mellitus with diabetic chronic kidney disease: Secondary | ICD-10-CM | POA: Diagnosis present

## 2020-04-12 DIAGNOSIS — L89021 Pressure ulcer of left elbow, stage 1: Secondary | ICD-10-CM | POA: Diagnosis present

## 2020-04-12 DIAGNOSIS — R509 Fever, unspecified: Secondary | ICD-10-CM

## 2020-04-12 DIAGNOSIS — A419 Sepsis, unspecified organism: Principal | ICD-10-CM | POA: Diagnosis present

## 2020-04-12 DIAGNOSIS — F0280 Dementia in other diseases classified elsewhere without behavioral disturbance: Secondary | ICD-10-CM | POA: Diagnosis present

## 2020-04-12 DIAGNOSIS — E11621 Type 2 diabetes mellitus with foot ulcer: Secondary | ICD-10-CM | POA: Diagnosis not present

## 2020-04-12 DIAGNOSIS — F0281 Dementia in other diseases classified elsewhere with behavioral disturbance: Secondary | ICD-10-CM | POA: Diagnosis not present

## 2020-04-12 DIAGNOSIS — E876 Hypokalemia: Secondary | ICD-10-CM | POA: Diagnosis not present

## 2020-04-12 DIAGNOSIS — I152 Hypertension secondary to endocrine disorders: Secondary | ICD-10-CM | POA: Diagnosis present

## 2020-04-12 DIAGNOSIS — E11649 Type 2 diabetes mellitus with hypoglycemia without coma: Secondary | ICD-10-CM | POA: Diagnosis not present

## 2020-04-12 DIAGNOSIS — E1165 Type 2 diabetes mellitus with hyperglycemia: Secondary | ICD-10-CM | POA: Diagnosis present

## 2020-04-12 DIAGNOSIS — I70219 Atherosclerosis of native arteries of extremities with intermittent claudication, unspecified extremity: Secondary | ICD-10-CM | POA: Diagnosis present

## 2020-04-12 DIAGNOSIS — E870 Hyperosmolality and hypernatremia: Secondary | ICD-10-CM | POA: Diagnosis present

## 2020-04-12 DIAGNOSIS — Z681 Body mass index (BMI) 19 or less, adult: Secondary | ICD-10-CM | POA: Diagnosis not present

## 2020-04-12 DIAGNOSIS — Z79899 Other long term (current) drug therapy: Secondary | ICD-10-CM

## 2020-04-12 DIAGNOSIS — E119 Type 2 diabetes mellitus without complications: Secondary | ICD-10-CM

## 2020-04-12 LAB — COMPREHENSIVE METABOLIC PANEL
ALT: 34 U/L (ref 0–44)
AST: 28 U/L (ref 15–41)
Albumin: 3.3 g/dL — ABNORMAL LOW (ref 3.5–5.0)
Alkaline Phosphatase: 56 U/L (ref 38–126)
Anion gap: 13 (ref 5–15)
BUN: 35 mg/dL — ABNORMAL HIGH (ref 8–23)
CO2: 26 mmol/L (ref 22–32)
Calcium: 9.7 mg/dL (ref 8.9–10.3)
Chloride: 111 mmol/L (ref 98–111)
Creatinine, Ser: 1.05 mg/dL — ABNORMAL HIGH (ref 0.44–1.00)
GFR calc Af Amer: 55 mL/min — ABNORMAL LOW (ref >60)
GFR calc non Af Amer: 48 mL/min — ABNORMAL LOW (ref >60)
Glucose, Bld: 261 mg/dL — ABNORMAL HIGH (ref 70–99)
Potassium: 3.9 mmol/L (ref 3.5–5.1)
Sodium: 150 mmol/L — ABNORMAL HIGH (ref 135–145)
Total Bilirubin: 0.7 mg/dL (ref 0.3–1.2)
Total Protein: 7.4 g/dL (ref 6.5–8.1)

## 2020-04-12 LAB — CBC WITH DIFFERENTIAL/PLATELET
Abs Immature Granulocytes: 0.05 10*3/uL (ref 0.00–0.07)
Basophils Absolute: 0.1 10*3/uL (ref 0.0–0.1)
Basophils Relative: 0 %
Eosinophils Absolute: 0 10*3/uL (ref 0.0–0.5)
Eosinophils Relative: 0 %
HCT: 42.5 % (ref 36.0–46.0)
Hemoglobin: 13.6 g/dL (ref 12.0–15.0)
Immature Granulocytes: 0 %
Lymphocytes Relative: 13 %
Lymphs Abs: 1.6 10*3/uL (ref 0.7–4.0)
MCH: 32.5 pg (ref 26.0–34.0)
MCHC: 32 g/dL (ref 30.0–36.0)
MCV: 101.4 fL — ABNORMAL HIGH (ref 80.0–100.0)
Monocytes Absolute: 0.6 10*3/uL (ref 0.1–1.0)
Monocytes Relative: 5 %
Neutro Abs: 9.4 10*3/uL — ABNORMAL HIGH (ref 1.7–7.7)
Neutrophils Relative %: 82 %
Platelets: 368 10*3/uL (ref 150–400)
RBC: 4.19 MIL/uL (ref 3.87–5.11)
RDW: 13.4 % (ref 11.5–15.5)
WBC: 11.7 10*3/uL — ABNORMAL HIGH (ref 4.0–10.5)
nRBC: 0 % (ref 0.0–0.2)

## 2020-04-12 LAB — URINALYSIS, ROUTINE W REFLEX MICROSCOPIC
Bacteria, UA: NONE SEEN
Bilirubin Urine: NEGATIVE
Glucose, UA: 150 mg/dL — AB
Hgb urine dipstick: NEGATIVE
Ketones, ur: NEGATIVE mg/dL
Nitrite: NEGATIVE
Protein, ur: 30 mg/dL — AB
Specific Gravity, Urine: 1.025 (ref 1.005–1.030)
pH: 6 (ref 5.0–8.0)

## 2020-04-12 LAB — PROTIME-INR
INR: 1.1 (ref 0.8–1.2)
Prothrombin Time: 13.5 seconds (ref 11.4–15.2)

## 2020-04-12 LAB — LACTIC ACID, PLASMA
Lactic Acid, Venous: 1.5 mmol/L (ref 0.5–1.9)
Lactic Acid, Venous: 1.3 mmol/L (ref 0.5–1.9)

## 2020-04-12 LAB — CBG MONITORING, ED: Glucose-Capillary: 268 mg/dL — ABNORMAL HIGH (ref 70–99)

## 2020-04-12 MED ORDER — SODIUM CHLORIDE 0.9 % IV BOLUS
1000.0000 mL | Freq: Once | INTRAVENOUS | Status: AC
  Administered 2020-04-12: 1000 mL via INTRAVENOUS

## 2020-04-12 MED ORDER — IOHEXOL 300 MG/ML  SOLN
100.0000 mL | Freq: Once | INTRAMUSCULAR | Status: AC | PRN
  Administered 2020-04-12: 100 mL via INTRAVENOUS

## 2020-04-12 MED ORDER — BRIMONIDINE TARTRATE 0.15 % OP SOLN
1.0000 [drp] | Freq: Two times a day (BID) | OPHTHALMIC | Status: DC
  Administered 2020-04-12 – 2020-04-14 (×4): 1 [drp] via OPHTHALMIC
  Filled 2020-04-12: qty 5

## 2020-04-12 MED ORDER — ACETAMINOPHEN 325 MG PO TABS: 650.0000 mg | ORAL_TABLET | Freq: Four times a day (QID) | ORAL | Status: DC | PRN

## 2020-04-12 MED ORDER — SODIUM CHLORIDE 0.9 % IV SOLN
1.0000 g | INTRAVENOUS | Status: DC
  Filled 2020-04-12: qty 10

## 2020-04-12 MED ORDER — SODIUM CHLORIDE (PF) 0.9 % IJ SOLN
INTRAMUSCULAR | Status: AC
  Filled 2020-04-12: qty 50

## 2020-04-12 MED ORDER — BISACODYL 10 MG RE SUPP: 10.0000 mg | Freq: Every day | RECTAL | Status: DC | PRN

## 2020-04-12 MED ORDER — INSULIN ASPART 100 UNIT/ML ~~LOC~~ SOLN
0.0000 [IU] | SUBCUTANEOUS | Status: DC
  Administered 2020-04-12: 5 [IU] via SUBCUTANEOUS
  Administered 2020-04-13: 1 [IU] via SUBCUTANEOUS
  Filled 2020-04-12: qty 0.09

## 2020-04-12 MED ORDER — VANCOMYCIN HCL IN DEXTROSE 1-5 GM/200ML-% IV SOLN
1000.0000 mg | Freq: Once | INTRAVENOUS | Status: AC
  Administered 2020-04-12: 1000 mg via INTRAVENOUS
  Filled 2020-04-12: qty 200

## 2020-04-12 MED ORDER — ONDANSETRON HCL 4 MG PO TABS: 4.0000 mg | ORAL_TABLET | Freq: Four times a day (QID) | ORAL | Status: DC | PRN

## 2020-04-12 MED ORDER — PIPERACILLIN-TAZOBACTAM 3.375 G IVPB 30 MIN
3.3750 g | Freq: Once | INTRAVENOUS | Status: AC
  Administered 2020-04-12: 3.375 g via INTRAVENOUS
  Filled 2020-04-12: qty 50

## 2020-04-12 MED ORDER — SODIUM CHLORIDE 0.45 % IV SOLN: INTRAVENOUS | Status: DC

## 2020-04-12 MED ORDER — INSULIN DETEMIR 100 UNIT/ML ~~LOC~~ SOLN
8.0000 [IU] | Freq: Every day | SUBCUTANEOUS | Status: DC
  Administered 2020-04-12: 8 [IU] via SUBCUTANEOUS
  Filled 2020-04-12: qty 0.08

## 2020-04-12 MED ORDER — ONDANSETRON HCL 4 MG/2ML IJ SOLN: 4.0000 mg | Freq: Four times a day (QID) | INTRAMUSCULAR | Status: DC | PRN

## 2020-04-12 MED ORDER — ACETAMINOPHEN 650 MG RE SUPP: 650.0000 mg | Freq: Four times a day (QID) | RECTAL | Status: DC | PRN

## 2020-04-12 MED ORDER — SODIUM CHLORIDE 0.9 % IV SOLN
1.0000 g | Freq: Once | INTRAVENOUS | Status: DC
  Filled 2020-04-12: qty 10

## 2020-04-12 MED ORDER — ENOXAPARIN SODIUM 30 MG/0.3ML ~~LOC~~ SOLN
30.0000 mg | SUBCUTANEOUS | Status: DC
  Administered 2020-04-12 – 2020-04-13 (×2): 30 mg via SUBCUTANEOUS
  Filled 2020-04-12 (×2): qty 0.3

## 2020-04-12 NOTE — ED Provider Notes (Signed)
[facility] DEPT Provider Note   CSN: [id number] Arrival date & time: 04/12/20  1134     History Chief Complaint  Patient presents with   Fever   Weakness   Altered Mental Status    [name] is a 86 y.o. female.  HPI     86yo female with history of alzheimer's dementia, DM, DVT, CKD, presents with concern for fever and altered mental status.    Not eating over last few days, sleeping the whole day, has not been talking or carrying on conversations, fever up to 103 at home  No nausea or vomiting, no diarrhea, no cough, did not have abdominal pain Does have sacral wound, hard area above it   Hx of dementia, normally able to have conversation, oriented to self or location. Has acted this way with prior infections twice, most recently when she had COVID 32 and UTI in January  Very painful area of sacrum when she is more aware, has been having pain since January  Blood sugar high  Spoke with daughter [name], older sister [name] coming to ED    Past Medical History:  Diagnosis Date   Alzheimer's dementia without behavioral disturbance ([place])    diagnosed 12/2015; Dr. Jade/neurology   Anemia    Asthma    Blood transfusion 05-24-12   Blood transfusion without reported diagnosis    Chronic kidney disease    dr Clover Mealy   Diabetes mellitus    Diverticulosis    DVT (deep venous thrombosis) ([place]) 03/2005   hx.left leg   Esophageal stricture    GERD (gastroesophageal reflux disease)    Glaucoma    Hiatal hernia 05/07/13   Hyperlipidemia    Hypertension    Osteoarthritis    Pancreatic duct dilated 05/07/13   Pulmonary nodules 05/07/13   PVD (peripheral vascular disease) ([place])    Tubular adenoma of colon     Patient Active Problem List   Diagnosis Date Noted   Sepsis ([place]) 04/12/2020   Encephalopathy 11/04/2019   Pressure injury of skin 11/04/2019   Hyperglycemia 11/03/2019   Aortic atherosclerosis ([place])  10/30/2019   Hyperlipidemia associated with type 2 diabetes mellitus ([place]) 10/30/2019   Bradycardia with 51-60 beats per minute 10/24/2019   COVID-19 virus infection 10/23/2019   Protein-calorie malnutrition ([place]) 07/21/2018   Osteoporosis 07/02/2018   Diabetic ulcer of foot with fat layer exposed ([place]) 05/14/2018   GERD (gastroesophageal reflux disease) 05/02/2018   Generalized weakness 05/02/2018   History of DVT in adulthood 06/26/2017   LBBB (left bundle branch block) 10/09/2016   Pleural effusion, left 09/23/2016   Coronary artery calcification seen on CAT scan 08/28/2016   Alzheimer's disease ([place]) 01/04/2016   Pancreas cyst 07/02/2015   Asthma, chronic 03/16/2015   Primary open angle glaucoma of left eye, moderate stage 12/25/2014   Pseudophakia of both eyes 02/15/2013   Anemia 05/25/2012   Astigmatism 04/29/2012   Allergic rhinitis 10/30/2010   Venous (peripheral) insufficiency 04/13/2007   Diabetes ([place]) 12/18/2006   Hypertension associated with diabetes ([place]) 12/18/2006   Atherosclerosis of native arteries of extremity with intermittent claudication ([place]) 12/18/2006   OSTEOARTHRITIS OF SPINE, NOS 12/18/2006    Past Surgical History:  Procedure Laterality Date   CATARACT EXTRACTION, BILATERAL     COLONOSCOPY  05/27/2012   Procedure: COLONOSCOPY;  Surgeon: Lafayette Dragon, MD;  Location: [facility] END[facility]COPY;  Service: Endoscopy;  Laterality: N/A;   ESOPHAGOGASTRODUODENOSCOPY  05/26/2012   Procedure: ESOPHAGOGASTRODUODENOSCOPY (EGD);  Surgeon: Lafayette Dragon,  MD;  Location: [facility];  Service: Endoscopy;  Laterality: N/A;   EUS N/A 07/08/2013   Procedure: UPPER ENDOSCOPIC ULTRASOUND (EUS) LINEAR;  Surgeon: Milus Banister, MD;  Location: [facility] ENDOS[facility];  Service: Endoscopy;  Laterality: N/A;  need side view scope   EYE SURGERY     NM MYOVIEW LTD  10/2016   Read as intermediate risk due to EF of 38%.Anemia or infarction noted. Only abnormal septal WM.      TRANSTHORACIC ECHOCARDIOGRAM  10/2016    EF 45-50% with possible inferior hypokinesis. Aortic sclerosis without stenosis (ordered to reassess EF from [facility])   VASCULAR SURGERY  09/2004   WRIST FRACTURE SURGERY     R wrist fracture, plate     OB History   No obstetric history on file.     Family History  Adopted: Yes  Problem Relation Age of Onset   Migraines Daughter    Cancer Son    Colon cancer Neg Hx     Social History   Tobacco Use   Smoking status: Never Smoker   Smokeless tobacco: Never Used  Vaping Use   Vaping Use: Never used  Substance Use Topics   Alcohol use: No   Drug use: No    Home Medications Prior to Admission medications   Medication Sig Start Date End Date Taking? Authorizing Provider  acetaminophen (TYLENOL) 500 MG tablet Take 500 mg by mouth every 8 (eight) hours as needed.   Yes [provider]  albuterol (PROAIR HFA) 108 (90 Base) MCG/ACT inhaler 2 PUFFS EVERY 6 HOURS AS NEEDED FOR SHORTNESS OF BREATH. Patient taking differently: Inhale 2 puffs into the lungs every 6 (six) hours as needed for wheezing or shortness of breath.  12/18/18  Yes Marin Olp, MD  albuterol (PROVENTIL) (2.5 MG/3ML) 0.083% nebulizer solution USE THREE MILLILITERS VIA NEBULIZATION BY MOUTH EVERY 6 HOURS AS NEEDED FOR WHEEZING OR SHORTNESS OF BREATH Patient taking differently: Take 2.5 mg by nebulization every 6 (six) hours as needed for wheezing or shortness of breath.  03/06/20  Yes Marin Olp, MD  amLODipine (NORVASC) 10 MG tablet Take 1 tablet (10 mg total) by mouth daily. 11/06/19  Yes Mercy Riding, MD  ascorbic acid (VITAMIN C) 500 MG tablet Take 500 mg by mouth 2 (two) times daily.   Yes [provider]  aspirin 81 MG tablet Take 81 mg by mouth daily.   Yes [provider]  brimonidine (ALPHAGAN P) 0.1 % SOLN Place 1 drop into both eyes 2 (two) times daily.  04/24/16  Yes [provider]  budesonide (PULMICORT)  0.25 MG/2ML nebulizer solution TAKE 2 MILLILITERS VIA NEBULIZATION DAILY AS DIRECTED Patient taking differently: Take 0.25 mg by nebulization every 4 (four) hours as needed (wheezing).  01/24/20  Yes Marin Olp, MD  calcium carbonate (OS-CAL) 600 MG TABS Take 600 mg by mouth 2 (two) times daily with a meal.   Yes [provider]  citalopram (CELEXA) 10 MG tablet TAKE 1 TABLET ONCE DAILY. Patient taking differently: Take 10 mg by mouth daily.  02/07/20  Yes Marin Olp, MD  donepezil (ARICEPT) 10 MG tablet TAKE ONE TABLET AT BEDTIME. Patient taking differently: Take 10 mg by mouth at bedtime.  06/01/19  Yes Marin Olp, MD  famotidine (PEPCID) 20 MG tablet TAKE ONE TABLET TWICE A DAY AS NEEDED FOR HEARTBURN OR INDIGESTION. Patient taking differently: Take 20 mg by mouth 2 (two) times daily as needed for heartburn or  indigestion.  12/07/19  Yes Marin Olp, MD  ferrous sulfate 325 (65 FE) MG EC tablet Take 325 mg by mouth daily with breakfast.    Yes [provider]  K Phos Mono-Sod Phos Di & Mono (PHOSPHA 250 NEUTRAL) [id number] MG TABS TAKE 1 TABLET BY MOUTH DAILY. Patient taking differently: Take 1 tablet by mouth daily.  03/29/20  Yes Marin Olp, MD  memantine (NAMENDA) 10 MG tablet TAKE 1 TABLET TWICE DAILY FOR MEMORY. Patient taking differently: Take 10 mg by mouth 2 (two) times daily.  10/04/19  Yes Tomi Likens, Adam R, DO  metFORMIN (GLUCOPHAGE) 500 MG tablet Take 1 tablet (500 mg total) by mouth 2 (two) times daily with a meal. 11/06/19  Yes Mercy Riding, MD  Multiple Vitamin (MULTIVITAMIN WITH MINERALS) TABS tablet Take 1 tablet by mouth daily.   Yes [provider]  QUEtiapine (SEROQUEL) 25 MG tablet Take 0.5-1 tablets (12.5-25 mg total) by mouth at bedtime. As needed for sleep and agitation if not able to sleep for 24-48 hours. 03/10/20  Yes Marin Olp, MD  senna-docusate (SENOKOT-S) 8.6-50 MG tablet Take 1 tablet by mouth 2 (two) times  daily between meals as needed for mild constipation. 11/06/19  Yes Mercy Riding, MD  traMADol (ULTRAM) 50 MG tablet Take 25 mg by mouth every 8 (eight) hours as needed for moderate pain.    Yes [provider]    Allergies    Patient has no known allergies.  Review of Systems   Review of Systems  Unable to perform ROS: Mental status change    Physical Exam Updated Vital Signs BP (!) 143/78    Pulse 95    Temp (!) 101.5 F (38.6 C) (Rectal)    Resp (!) 28    LMP  (LMP Unknown)    SpO2 95%   Physical Exam Vitals and nursing note reviewed.  Constitutional:      General: She is not in acute distress.    Appearance: She is well-developed. She is ill-appearing and toxic-appearing. She is not diaphoretic.  HENT:     Head: Normocephalic and atraumatic.  Eyes:     Conjunctiva/sclera: Conjunctivae normal.  Neck:     Comments: Negative kernigs/brudzinskie's  Cardiovascular:     Rate and Rhythm: Normal rate and regular rhythm.     Heart sounds: Normal heart sounds. No murmur heard.  No friction rub. No gallop.   Pulmonary:     Effort: Pulmonary effort is normal. No respiratory distress.     Breath sounds: Normal breath sounds. No wheezing or rales.  Abdominal:     General: There is no distension.     Palpations: Abdomen is soft.     Tenderness: There is no abdominal tenderness. There is no guarding.  Musculoskeletal:        General: No tenderness.     Cervical back: Normal range of motion.  Skin:    General: Skin is warm and dry.     Findings: No erythema or rash.     Comments: 2cm ulceration sacrum, mild surrounding erythema   Neurological:     GCS: GCS eye subscore is 3. GCS verbal subscore is 2. GCS motor subscore is 6.     Comments: Sleepy, mumbling in response to questions, difficult to understand, following commands intermittently      ED Results / Procedures / Treatments   Labs (all labs ordered are listed, but only abnormal results are displayed) Labs  Reviewed  COMPREHENSIVE METABOLIC PANEL - Abnormal; Notable for the following components:      Result Value   Sodium 150 (*)    Glucose, Bld 261 (*)    BUN 35 (*)    Creatinine, Ser 1.05 (*)    Albumin 3.3 (*)    GFR calc non Af Amer 48 (*)    GFR calc Af Amer 55 (*)    All other components within normal limits  CBC WITH DIFFERENTIAL/PLATELET - Abnormal; Notable for the following components:   WBC 11.7 (*)    MCV 101.4 (*)    Neutro Abs 9.4 (*)    All other components within normal limits  URINALYSIS, ROUTINE W REFLEX MICROSCOPIC - Abnormal; Notable for the following components:   Glucose, UA 150 (*)    Protein, ur 30 (*)    Leukocytes,Ua SMALL (*)    All other components within normal limits  CULTURE, BLOOD (ROUTINE X 2)  CULTURE, BLOOD (ROUTINE X 2)  URINE CULTURE  SARS CORONAVIRUS 2 BY RT PCR (HOSPITAL ORDER, [facility])  LACTIC ACID, PLASMA  LACTIC ACID, PLASMA  PROTIME-INR    EKG EKG Interpretation  Date/Time:  Wednesday April 12 2020 12:03:42 EDT Ventricular Rate:  101 PR Interval:    QRS Duration: 118 QT Interval:  364 QTC Calculation: 472 R Axis:   50 Text Interpretation: Sinus tachycardia Ventricular premature complex Nonspecific intraventricular conduction delay Probable anterior infarct, age indeterminate Lateral leads are also involved No significant change since last tracing Confirmed by Blanchie Dessert [phone]) on 04/12/2020 12:07:33 PM   Radiology DG Chest 2 View  Result Date: 04/12/2020 CLINICAL DATA:  Fever. Weakness. Altered mental status. Suspected sepsis. EXAM: CHEST - 2 VIEW COMPARISON:  Chest x-rays dated 11/03/2019 and 10/20/2019 and CT scan of the chest dated 08/28/2016 and CT scan of the abdomen dated 04/12/2020 FINDINGS: Heart size and pulmonary vascularity are normal. Slight progression of chronic changes at the left lung base as described on the CT scan of the abdomen on 04/12/2020. No acute infiltrates or effusions.  No acute bone abnormality. IMPRESSION: Slight progression of chronic changes at the left lung base. No acute abnormalities. Electronically Signed   By: Lorriane Shire M.D.   On: 04/12/2020 14:11   CT ABDOMEN PELVIS W CONTRAST  Result Date: 04/12/2020 CLINICAL DATA:  Fever and abdominal tenderness. EXAM: CT ABDOMEN AND PELVIS WITH CONTRAST TECHNIQUE: Multidetector CT imaging of the abdomen and pelvis was performed using the standard protocol following bolus administration of intravenous contrast. CONTRAST:  149mL OMNIPAQUE IOHEXOL 300 MG/ML  SOLN COMPARISON:  CT scan dated 11/03/2019 and 08/28/2016 FINDINGS: Lower chest: Chronic progressive bronchiectasis, peribronchial thickening and inflammatory changes peripherally at the left lung base with a tiny chronic left pleural effusion. Stable small pulmonary nodules in the right lung base since the prior CT scan of 2017. Heart size is normal. Chronic moderate hiatal hernia. Hepatobiliary: Liver parenchyma is normal. The gallbladder is slightly distended. No dilated bile ducts. Pancreas: Severe diffuse pancreatic atrophy with diffuse cystic changes throughout the entire pancreas, unchanged. Chronic calcifications in the pancreatic head and uncinate process. Spleen: Calcified granulomas.  Otherwise negative. Adrenals/Urinary Tract: Normal adrenal glands. Multiple chronic peripelvic and renal cysts on the left. Chronic slight dilatation of the proximal right ureter without hydronephrosis. 4 mm stone in the dependent portion of the left side of the bladder. Stomach/Bowel: Moderate chronic hiatal hernia. Small bowel appears normal. Appendix is normal. Moderate amount of stool scattered throughout the colon with  a large amount of stool in the rectum consistent with fecal impaction. There are few diverticula in the sigmoid portion of the colon without evidence of diverticulitis. Vascular/Lymphatic: Aortic atherosclerosis. No enlarged abdominal or pelvic lymph nodes.  Reproductive: Uterus and bilateral adnexa are unremarkable. Other: No abdominal wall hernia or abnormality. No abdominopelvic ascites. Musculoskeletal: No acute or significant osseous findings. IMPRESSION: 1. Large amount of stool in the rectum consistent with fecal impaction. 2. Chronic progressive bronchiectasis, peribronchial thickening and inflammatory changes peripherally at the left lung base with a tiny chronic left pleural effusion. 3. Chronic moderate hiatal hernia. 4. Chronic slight dilatation of the proximal right ureter without hydronephrosis. 5. 4 mm stone in the dependent portion of the left side of the bladder. 6. Aortic atherosclerosis. Aortic Atherosclerosis (ICD10-I70.0). Electronically Signed   By: Lorriane Shire M.D.   On: 04/12/2020 14:07    Procedures Procedures (including critical care time)  Medications Ordered in ED Medications  sodium chloride (PF) 0.9 % injection (has no administration in time range)  vancomycin (VANCOCIN) IVPB 1000 mg/200 mL premix (has no administration in time range)  sodium chloride 0.9 % bolus 1,000 mL (0 mLs Intravenous Stopped 04/12/20 1319)  piperacillin-tazobactam (ZOSYN) IVPB 3.375 g (0 g Intravenous Stopped 04/12/20 1319)  iohexol (OMNIPAQUE) 300 MG/ML solution 100 mL (100 mLs Intravenous Contrast Given 04/12/20 1312)    ED Course  I have reviewed the triage vital signs and the nursing notes.  Pertinent labs & imaging results that were available during my care of the patient were reviewed by me and considered in my medical decision making (see chart for details).    MDM Rules/Calculators/A&P                          86yo female with history of alzheimer's dementia, DM, DVT, CKD, presents with concern for fever and altered mental status.    Febrile on arrival to the ED, mild tachycardia and no hypotension.    Ordered IV bolus NS, zosyn for possible urinary or intraabdominal source of infection given abdominal tenderness on exam.  History  is limited by AMS. No meningeal signs on exam, low suspicion for meningitis and she has had similar AMS with other infections before and suspect encephalopathy secondary to other infection.  CXR shows chronic changes.  Urinalysis shows 11-20WBC.  CT abdomen without acute findings or infectious findings.  COVID 19 testing pending.  Labs significant for hyponatremia with a sodium of 150.  This is likely in the setting setting of decreased p.o. intake. Improvement in mental status with fluids, answering some questions, opening eyes.    Given UA borderline for UTI will consider other etiologies of infection.  Possible cellulitis of sacral wound, consider ?MR as inpatient. COVID 19 pending.    Final Clinical Impression(s) / ED Diagnoses Final diagnoses:  Fever, unspecified fever cause  Cellulitis of buttock  Hypernatremia  Dehydration    Rx / DC Orders ED Discharge Orders    None       Gareth Morgan, MD 04/12/20 1526

## 2020-04-12 NOTE — H&P (Addendum)
History and Physical  Patient Name: [name]     OFB:[id number]    DOB: [birth year]    DOA: 04/12/2020 PCP: Marin Olp, MD  Patient coming from: Home  Chief Complaint: Fever, decreased mentation      HPI: [name] is a 86 y.o. F with hx dementia, home dwelling, DM, and HTN who presents with few days decreased mentation and now fever.  Caveat that all history collected from daughter at the bedside as the patient is too altered to respond.  Patient was in her usual state of health until a few days ago, family started to notice she was weaker, unable to get out of bed, less responsive.  Then today she was febrile so she was brought to the emergency room.  Family have noticed no cough or respiratory symptoms.  They have noticed no change in her urination.  In the ER, patient febrile 101.5, heart rate 101, respirations 28.  Urinalysis showed few leukocytes.  CT abdomen and pelvis showed old bronchiectasis, stool impaction, no other abdominal findings.  Chest x-ray showed chronic lower lobe findings bilaterally.  Lactate normal.  Sodium 150.  Patient was started on IV fluids and ceftriaxone in the hospital service were asked to evaluate for suspected sepsis, unclear source.            ROS: Review of Systems  Unable to perform ROS: Dementia  Family note fever, weakness, decreased mentation.  They note no respiratory symptoms, no pain complaints, no change in urination.  They note a chronic hard place above the sacrum.      Past Medical History:  Diagnosis Date  . Alzheimer's dementia without behavioral disturbance ([place])    diagnosed 12/2015; Dr. Jade/neurology  . Anemia   . Asthma   . Blood transfusion 05-24-12  . Blood transfusion without reported diagnosis   . Chronic kidney disease    dr Clover Mealy  . Diabetes mellitus   . Diverticulosis   . DVT (deep venous thrombosis) ([place]) 03/2005   hx.left leg  . Esophageal stricture   . GERD (gastroesophageal reflux  disease)   . Glaucoma   . Hiatal hernia 05/07/13  . Hyperlipidemia   . Hypertension   . Osteoarthritis   . Pancreatic duct dilated 05/07/13  . Pulmonary nodules 05/07/13  . PVD (peripheral vascular disease) ([place])   . Tubular adenoma of colon     Past Surgical History:  Procedure Laterality Date  . CATARACT EXTRACTION, BILATERAL    . COLONOSCOPY  05/27/2012   Procedure: COLONOSCOPY;  Surgeon: Lafayette Dragon, MD;  Location: [facility];  Service: Endoscopy;  Laterality: N/A;  . ESOPHAGOGASTRODUODENOSCOPY  05/26/2012   Procedure: ESOPHAGOGASTRODUODENOSCOPY (EGD);  Surgeon: Lafayette Dragon, MD;  Location: [facility];  Service: Endoscopy;  Laterality: N/A;  . EUS N/A 07/08/2013   Procedure: UPPER ENDOSCOPIC ULTRASOUND (EUS) LINEAR;  Surgeon: Milus Banister, MD;  Location: [facility];  Service: Endoscopy;  Laterality: N/A;  need side view scope  . EYE SURGERY    . NM MYOVIEW LTD  10/2016   Read as intermediate risk due to EF of 38%.Anemia or infarction noted. Only abnormal septal WM.   Marland Kitchen TRANSTHORACIC ECHOCARDIOGRAM  10/2016    EF 45-50% with possible inferior hypokinesis. Aortic sclerosis without stenosis (ordered to reassess EF from Myoview)  . VASCULAR SURGERY  09/2004  . WRIST FRACTURE SURGERY     R wrist fracture, plate    Social History: Patient lives at her home with her husband.  She  has around-the-clock care from family.  At baseline, she is able to transfer with assistance, able to walk very short distances with a walker.  She is oriented to self and close family members only at baseline.  nonsmoker.  No Known Allergies  Family history: family history includes Cancer in her son; Migraines in her daughter. She was adopted.  Prior to Admission medications   Medication Sig Start Date End Date Taking? Authorizing Provider  acetaminophen (TYLENOL) 500 MG tablet Take 500 mg by mouth every 8 (eight) hours as needed.   Yes [provider]  albuterol (PROAIR HFA) 108 (90 Base)  MCG/ACT inhaler 2 PUFFS EVERY 6 HOURS AS NEEDED FOR SHORTNESS OF BREATH. Patient taking differently: Inhale 2 puffs into the lungs every 6 (six) hours as needed for wheezing or shortness of breath.  12/18/18  Yes Marin Olp, MD  albuterol (PROVENTIL) (2.5 MG/3ML) 0.083% nebulizer solution USE THREE MILLILITERS VIA NEBULIZATION BY MOUTH EVERY 6 HOURS AS NEEDED FOR WHEEZING OR SHORTNESS OF BREATH Patient taking differently: Take 2.5 mg by nebulization every 6 (six) hours as needed for wheezing or shortness of breath.  03/06/20  Yes Marin Olp, MD  amLODipine (NORVASC) 10 MG tablet Take 1 tablet (10 mg total) by mouth daily. 11/06/19  Yes Mercy Riding, MD  ascorbic acid (VITAMIN C) 500 MG tablet Take 500 mg by mouth 2 (two) times daily.   Yes [provider]  aspirin 81 MG tablet Take 81 mg by mouth daily.   Yes [provider]  brimonidine (ALPHAGAN P) 0.1 % SOLN Place 1 drop into both eyes 2 (two) times daily.  04/24/16  Yes [provider]  budesonide (PULMICORT) 0.25 MG/2ML nebulizer solution TAKE 2 MILLILITERS VIA NEBULIZATION DAILY AS DIRECTED Patient taking differently: Take 0.25 mg by nebulization every 4 (four) hours as needed (wheezing).  01/24/20  Yes Marin Olp, MD  calcium carbonate (OS-CAL) 600 MG TABS Take 600 mg by mouth 2 (two) times daily with a meal.   Yes [provider]  citalopram (CELEXA) 10 MG tablet TAKE 1 TABLET ONCE DAILY. Patient taking differently: Take 10 mg by mouth daily.  02/07/20  Yes Marin Olp, MD  donepezil (ARICEPT) 10 MG tablet TAKE ONE TABLET AT BEDTIME. Patient taking differently: Take 10 mg by mouth at bedtime.  06/01/19  Yes Marin Olp, MD  famotidine (PEPCID) 20 MG tablet TAKE ONE TABLET TWICE A DAY AS NEEDED FOR HEARTBURN OR INDIGESTION. Patient taking differently: Take 20 mg by mouth 2 (two) times daily as needed for heartburn or indigestion.  12/07/19  Yes Marin Olp, MD  ferrous sulfate  325 (65 FE) MG EC tablet Take 325 mg by mouth daily with breakfast.    Yes [provider]  K Phos Mono-Sod Phos Di & Mono (PHOSPHA 250 NEUTRAL) [id number] MG TABS TAKE 1 TABLET BY MOUTH DAILY. Patient taking differently: Take 1 tablet by mouth daily.  03/29/20  Yes Marin Olp, MD  memantine (NAMENDA) 10 MG tablet TAKE 1 TABLET TWICE DAILY FOR MEMORY. Patient taking differently: Take 10 mg by mouth 2 (two) times daily.  10/04/19  Yes Tomi Likens, Adam R, DO  metFORMIN (GLUCOPHAGE) 500 MG tablet Take 1 tablet (500 mg total) by mouth 2 (two) times daily with a meal. 11/06/19  Yes Mercy Riding, MD  Multiple Vitamin (MULTIVITAMIN WITH MINERALS) TABS tablet Take 1 tablet by mouth daily.   Yes [provider]  QUEtiapine (SEROQUEL) 25  MG tablet Take 0.5-1 tablets (12.5-25 mg total) by mouth at bedtime. As needed for sleep and agitation if not able to sleep for 24-48 hours. 03/10/20  Yes Marin Olp, MD  senna-docusate (SENOKOT-S) 8.6-50 MG tablet Take 1 tablet by mouth 2 (two) times daily between meals as needed for mild constipation. 11/06/19  Yes Mercy Riding, MD  traMADol (ULTRAM) 50 MG tablet Take 25 mg by mouth every 8 (eight) hours as needed for moderate pain.    Yes [provider]       Physical Exam: BP 135/73   Pulse 84   Temp (!) 101.5 F (38.6 C) (Rectal)   Resp 16   LMP  (LMP Unknown)   SpO2 94%  General appearance: Frail appearing elderly female, somnolent, opens eyes to voice.  No obvious distress, but appears ill   Eyes: Anicteric, conjunctiva inflamed, lids and lashes normal. PERRL.    ENT: No nasal deformity, discharge, epistaxis.  Hearing diminished.  Lips dry and cracked, dentition seems okay, but the oropharynx is incredibly dry and caked.  No oral lesions.   Neck: No neck masses.  Trachea midline.  No thyromegaly/tenderness. Lymph: No cervical or supraclavicular lymphadenopathy. Skin: Warm and moist.  No jaundice.  There is a firm oblong  bony protuberance above the sacrum.  I presume this is the sacrum itself.  This is read but not fluctuant. Cardiac: Tachycardic, regular, nl S1-S2, no murmurs appreciated.  Capillary refill is brisk.  JVP normal.  No LE edema.  Radial and DP pulses bounding and symmetric. Respiratory: Respiratory rate normal, effort shallow.  I suspect there are rales in the left base.  Abdomen: Abdomen soft.  No grimace to palpation, no rigidity or guarding.  I do not appreciate any apparent tenderness.  No ascites or distention.   MSK: Fuhs loss of subcutaneous muscle mass and fat.   Neuro: Pupils equal round and reactive to light.  Makes eye contact.  Has severe generalized weakness in all 4 extremities.  Speech is slow and halting but appears fluent.  The face is symmetric.   Psych: Somnolent but opens eyes to touch.  Psychomotor slowing is profound, attention diminished, affect blunted, judgment insight appear impaired        Labs on Admission:  I have personally reviewed following labs and imaging studies: CBC: Recent Labs  Lab 04/12/20 1158  WBC 11.7*  NEUTROABS 9.4*  HGB 13.6  HCT 42.5  MCV 101.4*  PLT 937   Basic Metabolic Panel: Recent Labs  Lab 04/12/20 1158  NA 150*  K 3.9  CL 111  CO2 26  GLUCOSE 261*  BUN 35*  CREATININE 1.05*  CALCIUM 9.7   GFR: CrCl cannot be calculated (Unknown ideal weight.).  Liver Function Tests: Recent Labs  Lab 04/12/20 1158  AST 28  ALT 34  ALKPHOS 56  BILITOT 0.7  PROT 7.4  ALBUMIN 3.3*   Coagulation Profile: Recent Labs  Lab 04/12/20 1158  INR 1.1   Sepsis Labs: Lactic acid 1.5         Radiological Exams on Admission: Personally reviewed chest x-ray shows change in the bilateral bases, which are not very prominent, CT abdomen pelvis report reviewed: DG Chest 2 View  Result Date: 04/12/2020 CLINICAL DATA:  Fever. Weakness. Altered mental status. Suspected sepsis. EXAM: CHEST - 2 VIEW COMPARISON:  Chest x-rays dated  11/03/2019 and 10/20/2019 and CT scan of the chest dated 08/28/2016 and CT scan of the abdomen dated 04/12/2020 FINDINGS: Heart  size and pulmonary vascularity are normal. Slight progression of chronic changes at the left lung base as described on the CT scan of the abdomen on 04/12/2020. No acute infiltrates or effusions. No acute bone abnormality. IMPRESSION: Slight progression of chronic changes at the left lung base. No acute abnormalities. Electronically Signed   By: Lorriane Shire M.D.   On: 04/12/2020 14:11   CT ABDOMEN PELVIS W CONTRAST  Result Date: 04/12/2020 CLINICAL DATA:  Fever and abdominal tenderness. EXAM: CT ABDOMEN AND PELVIS WITH CONTRAST TECHNIQUE: Multidetector CT imaging of the abdomen and pelvis was performed using the standard protocol following bolus administration of intravenous contrast. CONTRAST:  125mL OMNIPAQUE IOHEXOL 300 MG/ML  SOLN COMPARISON:  CT scan dated 11/03/2019 and 08/28/2016 FINDINGS: Lower chest: Chronic progressive bronchiectasis, peribronchial thickening and inflammatory changes peripherally at the left lung base with a tiny chronic left pleural effusion. Stable small pulmonary nodules in the right lung base since the prior CT scan of 2017. Heart size is normal. Chronic moderate hiatal hernia. Hepatobiliary: Liver parenchyma is normal. The gallbladder is slightly distended. No dilated bile ducts. Pancreas: Severe diffuse pancreatic atrophy with diffuse cystic changes throughout the entire pancreas, unchanged. Chronic calcifications in the pancreatic head and uncinate process. Spleen: Calcified granulomas.  Otherwise negative. Adrenals/Urinary Tract: Normal adrenal glands. Multiple chronic peripelvic and renal cysts on the left. Chronic slight dilatation of the proximal right ureter without hydronephrosis. 4 mm stone in the dependent portion of the left side of the bladder. Stomach/Bowel: Moderate chronic hiatal hernia. Small bowel appears normal. Appendix is normal.  Moderate amount of stool scattered throughout the colon with a large amount of stool in the rectum consistent with fecal impaction. There are few diverticula in the sigmoid portion of the colon without evidence of diverticulitis. Vascular/Lymphatic: Aortic atherosclerosis. No enlarged abdominal or pelvic lymph nodes. Reproductive: Uterus and bilateral adnexa are unremarkable. Other: No abdominal wall hernia or abnormality. No abdominopelvic ascites. Musculoskeletal: No acute or significant osseous findings. IMPRESSION: 1. Large amount of stool in the rectum consistent with fecal impaction. 2. Chronic progressive bronchiectasis, peribronchial thickening and inflammatory changes peripherally at the left lung base with a tiny chronic left pleural effusion. 3. Chronic moderate hiatal hernia. 4. Chronic slight dilatation of the proximal right ureter without hydronephrosis. 5. 4 mm stone in the dependent portion of the left side of the bladder. 6. Aortic atherosclerosis. Aortic Atherosclerosis (ICD10-I70.0). Electronically Signed   By: Lorriane Shire M.D.   On: 04/12/2020 14:07    EKG: Independently reviewed.  Left bundle branch block, sinus tachycardia.       Assessment/Plan   Sepsis, unclear source Presents with fever, tachcyardia, hyperglycemia, endorgan damage (encephalopathy).  Bronchiectasis on CT, mild rales on left base on make my exam, possible pneumonia.  Other possibilities include urinary tract infection, osteomyelitis of the sacrum.  -IV fluids -Ceftriaxone -Follow blood cultures -Obtain MRI sacrum to rule out osteomyelitis; given her encephalopathy and tachcyardia, I feel compelled to start empiric antibiotics   Hypernatremia I will avoid D5 with her hyperglycemia -Half-normal saline 100 cc/h -Trend BMP  Stool impaction Noted on CT.  Due to dehydration. -Dulcolax suppository -MiraLAX when able to take p.o.  Acute metabolic encephalopathy superimposed on advanced baseline  dementia The patient's encephalopathy is from sepsis -SLP consult when more alert -Hold home Seroquel, citalopram, donepezil, and memantine until able to take p.o.  Hypertension Blood pressure here stable within normal limits -Hold home amlodipine until able to take p.o.  Diabetes Glucose here elevated over  200 -Sliding scale correction insulin -Start low-dose Levemir -Hold home Metformin  Covid, recovered in January  Other medications -Continue brimonidine -Hold iron and famotidine until able to take p.o.  Severe protein calorie malnutrition    DVT prophylaxis: Lovenox  Code Status: DNR  Family Communication: daughter at the bedside  Disposition Plan: Anticipate IV fluids, IV antibiotics, follow cultures and BMP Consults called: None Admission status: INPATIENT     Medical decision making: Patient seen at 4:40 PM on 04/12/2020.  The patient was discussed with Dr. Billy Fischer.  What exists of the patient's chart was reviewed in depth and summarized above.  Clinical condition: stable hemodynamically but still encephalopathic.        [facility] Please page though [place] or Epic secure chat:  For password, contact charge nurse

## 2020-04-12 NOTE — Progress Notes (Signed)
A consult was received from an ED physician for Vancomycin per pharmacy dosing.  The patient's profile has been reviewed for ht/wt/allergies/indication/available labs.   A one time order has been placed for Vancomycin 1gm IV.  Further antibiotics/pharmacy consults should be ordered by admitting physician if indicated.                       Thank you, Netta Cedars PharmD 04/12/2020  3:23 PM

## 2020-04-12 NOTE — ED Notes (Signed)
Pure wick has been placed. Suction set to 45mmHg.  

## 2020-04-12 NOTE — ED Triage Notes (Signed)
Per EMS:  Patient is coming from home with complaint of fever, weakness. Patient reportedly has had a fever for the past 3 days. Patient called in by [facility] as a Code sepsis

## 2020-04-13 DIAGNOSIS — J189 Pneumonia, unspecified organism: Secondary | ICD-10-CM

## 2020-04-13 DIAGNOSIS — L89150 Pressure ulcer of sacral region, unstageable: Secondary | ICD-10-CM

## 2020-04-13 DIAGNOSIS — G9341 Metabolic encephalopathy: Secondary | ICD-10-CM

## 2020-04-13 LAB — CBC
HCT: 37.6 % (ref 36.0–46.0)
Hemoglobin: 12 g/dL (ref 12.0–15.0)
MCH: 32.4 pg (ref 26.0–34.0)
MCHC: 31.9 g/dL (ref 30.0–36.0)
MCV: 101.6 fL — ABNORMAL HIGH (ref 80.0–100.0)
Platelets: 332 10*3/uL (ref 150–400)
RBC: 3.7 MIL/uL — ABNORMAL LOW (ref 3.87–5.11)
RDW: 13.2 % (ref 11.5–15.5)
WBC: 13 10*3/uL — ABNORMAL HIGH (ref 4.0–10.5)
nRBC: 0 % (ref 0.0–0.2)

## 2020-04-13 LAB — BASIC METABOLIC PANEL
Anion gap: 8 (ref 5–15)
BUN: 27 mg/dL — ABNORMAL HIGH (ref 8–23)
CO2: 28 mmol/L (ref 22–32)
Calcium: 8.9 mg/dL (ref 8.9–10.3)
Chloride: 112 mmol/L — ABNORMAL HIGH (ref 98–111)
Creatinine, Ser: 0.68 mg/dL (ref 0.44–1.00)
GFR calc Af Amer: >60 mL/min (ref >60)
GFR calc non Af Amer: >60 mL/min (ref >60)
Glucose, Bld: 63 mg/dL — ABNORMAL LOW (ref 70–99)
Potassium: 2.9 mmol/L — ABNORMAL LOW (ref 3.5–5.1)
Sodium: 148 mmol/L — ABNORMAL HIGH (ref 135–145)

## 2020-04-13 LAB — SARS CORONAVIRUS 2 BY RT PCR (HOSPITAL ORDER, PERFORMED IN ~~LOC~~ HOSPITAL LAB): SARS Coronavirus 2: NEGATIVE

## 2020-04-13 LAB — HEMOGLOBIN A1C
Hgb A1c MFr Bld: 6.5 % — ABNORMAL HIGH (ref 4.8–5.6)
Mean Plasma Glucose: 139.85 mg/dL

## 2020-04-13 LAB — GLUCOSE, CAPILLARY
Glucose-Capillary: 106 mg/dL — ABNORMAL HIGH (ref 70–99)
Glucose-Capillary: 136 mg/dL — ABNORMAL HIGH (ref 70–99)
Glucose-Capillary: 150 mg/dL — ABNORMAL HIGH (ref 70–99)
Glucose-Capillary: 187 mg/dL — ABNORMAL HIGH (ref 70–99)
Glucose-Capillary: 192 mg/dL — ABNORMAL HIGH (ref 70–99)
Glucose-Capillary: 53 mg/dL — ABNORMAL LOW (ref 70–99)
Glucose-Capillary: 90 mg/dL (ref 70–99)

## 2020-04-13 LAB — MAGNESIUM: Magnesium: 1.9 mg/dL (ref 1.7–2.4)

## 2020-04-13 MED ORDER — DONEPEZIL HCL 10 MG PO TABS
10.0000 mg | ORAL_TABLET | Freq: Every day | ORAL | Status: DC
  Administered 2020-04-13: 10 mg via ORAL
  Filled 2020-04-13: qty 1

## 2020-04-13 MED ORDER — INSULIN ASPART 100 UNIT/ML ~~LOC~~ SOLN
0.0000 [IU] | Freq: Three times a day (TID) | SUBCUTANEOUS | Status: DC
  Administered 2020-04-14: 1 [IU] via SUBCUTANEOUS
  Administered 2020-04-14: 2 [IU] via SUBCUTANEOUS

## 2020-04-13 MED ORDER — POTASSIUM CHLORIDE 10 MEQ/100ML IV SOLN
10.0000 meq | INTRAVENOUS | Status: AC
  Administered 2020-04-13 (×4): 10 meq via INTRAVENOUS
  Filled 2020-04-13 (×4): qty 100

## 2020-04-13 MED ORDER — INSULIN ASPART 100 UNIT/ML ~~LOC~~ SOLN: 0.0000 [IU] | Freq: Every day | SUBCUTANEOUS | Status: DC

## 2020-04-13 MED ORDER — SODIUM CHLORIDE 0.9 % IV SOLN
1.0000 g | INTRAVENOUS | Status: DC
  Administered 2020-04-13 – 2020-04-14 (×2): 1 g via INTRAVENOUS
  Filled 2020-04-13: qty 10
  Filled 2020-04-13: qty 1
  Filled 2020-04-13: qty 10
  Filled 2020-04-13: qty 1

## 2020-04-13 MED ORDER — MEMANTINE HCL 10 MG PO TABS
10.0000 mg | ORAL_TABLET | Freq: Two times a day (BID) | ORAL | Status: DC
  Administered 2020-04-13 – 2020-04-14 (×2): 10 mg via ORAL
  Filled 2020-04-13 (×2): qty 1

## 2020-04-13 MED ORDER — COLLAGENASE 250 UNIT/GM EX OINT
1.0000 "application " | TOPICAL_OINTMENT | Freq: Every day | CUTANEOUS | Status: DC
  Administered 2020-04-13 – 2020-04-14 (×2): 1 via TOPICAL
  Filled 2020-04-13: qty 30

## 2020-04-13 MED ORDER — QUETIAPINE FUMARATE 25 MG PO TABS: 12.5000 mg | ORAL_TABLET | Freq: Every evening | ORAL | Status: DC | PRN

## 2020-04-13 MED ORDER — DEXTROSE 50 % IV SOLN
50.0000 mL | Freq: Once | INTRAVENOUS | Status: AC
  Administered 2020-04-13: 50 mL via INTRAVENOUS
  Filled 2020-04-13: qty 50

## 2020-04-13 MED ORDER — INSULIN DETEMIR 100 UNIT/ML ~~LOC~~ SOLN
5.0000 [IU] | Freq: Every day | SUBCUTANEOUS | Status: DC
  Filled 2020-04-13: qty 0.05

## 2020-04-13 MED ORDER — CITALOPRAM HYDROBROMIDE 20 MG PO TABS
10.0000 mg | ORAL_TABLET | Freq: Every day | ORAL | Status: DC
  Administered 2020-04-14: 10 mg via ORAL
  Filled 2020-04-13: qty 1

## 2020-04-13 MED ORDER — POLYETHYLENE GLYCOL 3350 17 G PO PACK
17.0000 g | PACK | Freq: Every day | ORAL | Status: DC
  Administered 2020-04-14: 17 g via ORAL
  Filled 2020-04-13: qty 1

## 2020-04-13 NOTE — Progress Notes (Signed)
   04/13/20 0010  Assess: MEWS Score  Temp 97.7 F (36.5 C)  BP 112/62  Pulse Rate 64  Resp 20  SpO2 97 %  O2 Device Room Air  Assess: MEWS Score  MEWS Temp 0  MEWS Systolic 0  MEWS Pulse 0  MEWS RR 0  MEWS LOC 2  MEWS Score 2  MEWS Score Color Yellow  Assess: if the MEWS score is Yellow or Red  Were vital signs taken at a resting state? Yes  Focused Assessment Documented focused assessment  MEWS guidelines implemented *See Row Information* No, previously yellow, continue vital signs every 4 hours  Notify: Charge Nurse/RN  Name of Charge Nurse/RN Notified Mechele Claude, RN  Date Charge Nurse/RN Notified 04/13/20  Time Charge Nurse/RN Notified [phone]

## 2020-04-13 NOTE — Consult Note (Signed)
[facility] Nurse Consult Note: Reason for Consult: pressure injuries Patient from home; COVID history.  CG in the room reported POA heel injuries after COVID dx.  She also reports limited mobility since COVID dx. Wound type: 1. Unstageable Pressure Injury: 1cm x 1cm x 0.1cm; 100% soft grey eschar 2. Deep Tissue Pressure Injury: 2.0cm x 1.5cm x 0cm; 100% dark purple non blanchable tissue 3. Stage 1 Pressure injury: appears to blanch however most of the site Pressure Injury POA: Yes Measurement:see above Wound bed: see above  Drainage (amount, consistency, odor) none Periwound: intact  Dressing procedure/placement/frequency: 1. Silicone foam to the right and left heel; protection 2. Add prevalon boots bilaterally, teach CG to use in the home; to be taken home with patient 3. Add enzymatic debridement to the sacral wound daily 4. Add air mattress for pressure relief and moisture management 5. purewick in place for management of urinary incontinence   Discussed POC with patient and bedside nurse.  Re consult if needed, will not follow at this time. Thanks  Hipolito Martinezlopez [employer], RN,CWOCN, CNS, [place] [phone])

## 2020-04-13 NOTE — Progress Notes (Signed)
Hypoglycemic Event  CBG: 53  Treatment: D50 50 mL (25 gm)  Symptoms: None  Follow-up CBG: OVAN:1916 CBG Result:187  Possible Reasons for Event: Inadequate meal intake  Comments/MD notified:Yes    [name] A [name]

## 2020-04-13 NOTE — Evaluation (Signed)
Clinical/Bedside Swallow Evaluation Patient Details  Name: [name] MRN: [id number] Date of Birth: 1930/12/23  Today's Date: 04/13/2020 Time: SLP Start Time (ACUTE ONLY): 1140 SLP Stop Time (ACUTE ONLY): 1156 SLP Time Calculation (min) (ACUTE ONLY): 16 min  Past Medical History:  Past Medical History:  Diagnosis Date  . Alzheimer's dementia without behavioral disturbance ([place])    diagnosed 12/2015; Dr. Jade/neurology  . Anemia   . Asthma   . Blood transfusion 05-24-12  . Blood transfusion without reported diagnosis   . Chronic kidney disease    dr Clover Mealy  . Diabetes mellitus   . Diverticulosis   . DVT (deep venous thrombosis) ([place]) 03/2005   hx.left leg  . Esophageal stricture   . GERD (gastroesophageal reflux disease)   . Glaucoma   . Hiatal hernia 05/07/13  . Hyperlipidemia   . Hypertension   . Osteoarthritis   . Pancreatic duct dilated 05/07/13  . Pulmonary nodules 05/07/13  . PVD (peripheral vascular disease) ([place])   . Tubular adenoma of colon    Past Surgical History:  Past Surgical History:  Procedure Laterality Date  . CATARACT EXTRACTION, BILATERAL    . COLONOSCOPY  05/27/2012   Procedure: COLONOSCOPY;  Surgeon: Lafayette Dragon, MD;  Location: [facility];  Service: Endoscopy;  Laterality: N/A;  . ESOPHAGOGASTRODUODENOSCOPY  05/26/2012   Procedure: ESOPHAGOGASTRODUODENOSCOPY (EGD);  Surgeon: Lafayette Dragon, MD;  Location: [facility];  Service: Endoscopy;  Laterality: N/A;  . EUS N/A 07/08/2013   Procedure: UPPER ENDOSCOPIC ULTRASOUND (EUS) LINEAR;  Surgeon: Milus Banister, MD;  Location: [facility];  Service: Endoscopy;  Laterality: N/A;  need side view scope  . EYE SURGERY    . NM MYOVIEW LTD  10/2016   Read as intermediate risk due to EF of 38%.Anemia or infarction noted. Only abnormal septal WM.   Marland Kitchen TRANSTHORACIC ECHOCARDIOGRAM  10/2016    EF 45-50% with possible inferior hypokinesis. Aortic sclerosis without stenosis (ordered to reassess EF from Myoview)  .  VASCULAR SURGERY  09/2004  . WRIST FRACTURE SURGERY     R wrist fracture, plate   HPI:  [name] is a 86 y.o. F with hx dementia, home dwelling, DM, and HTN who presents with few days decreased mentation and now fever. Being evalauted for Sepsis of unclear source. Pt was evalauted by SLP on 10/25/19, with finding of normal swallow but given hx of Esophageal stricture, GERD, hiatal hernia and dementia with assist needed for meals, mech soft with thin recommended.    Assessment / Plan / Recommendation Clinical Impression  Pt now alert back to baseline mentation. Daughter at bedside reports pt is tolerating PO well. Reports she typically has prolonged mastication, which is also observed today. No signs of aspiration. Daughter is making approprite food selections and feeding pt with precautions. No SLP intervention needed will sign off.  SLP Visit Diagnosis: Dysphagia, unspecified (R13.10)    Aspiration Risk  Mild aspiration risk    Diet Recommendation Regular;Thin liquid   Liquid Administration via: Cup;Straw Medication Administration: Whole meds with liquid Supervision: Patient able to self feed Postural Changes: Seated upright at 90 degrees    Other  Recommendations Oral Care Recommendations: Oral care BID   Follow up Recommendations 24 hour supervision/assistance      Frequency and Duration            Prognosis        Swallow Study   General HPI: [name] is a 86 y.o. F with hx dementia, home  dwelling, DM, and HTN who presents with few days decreased mentation and now fever. Being evalauted for Sepsis of unclear source. Pt was evalauted by SLP on 10/25/19, with finding of normal swallow but given hx of Esophageal stricture, GERD, hiatal hernia and dementia with assist needed for meals, mech soft with thin recommended.  Type of Study: Bedside Swallow Evaluation Previous Swallow Assessment: see HPI Diet Prior to this Study: Regular;Thin liquids Temperature Spikes Noted:  No Respiratory Status: Room air History of Recent Intubation: No Behavior/Cognition: Alert;Cooperative Oral Cavity Assessment: Within Functional Limits Oral Care Completed by SLP: No Oral Cavity - Dentition: Dentures, top Vision: Functional for self-feeding Self-Feeding Abilities: Needs assist Patient Positioning: Upright in bed Baseline Vocal Quality: Normal Volitional Cough: Strong Volitional Swallow: Able to elicit    Oral/Motor/Sensory Function     Ice Chips     Thin Liquid Thin Liquid: Within functional limits Presentation: Straw    Nectar Thick Nectar Thick Liquid: Not tested   Honey Thick Honey Thick Liquid: Not tested   Puree Puree: Within functional limits Presentation: Spoon   Solid     Solid: Impaired Oral Phase Functional Implications: Prolonged oral transit      [name], [name] 04/13/2020,12:37 PM

## 2020-04-13 NOTE — Progress Notes (Addendum)
Inpatient Diabetes Program Recommendations  AACE/ADA: New Consensus Statement on Inpatient Glycemic Control (2015)  Target Ranges:  Prepandial:   less than 140 mg/dL      Peak postprandial:   less than 180 mg/dL (1-2 hours)      Critically ill patients:  140 - 180 mg/dL   Lab Results  Component Value Date   GLUCAP 136 (H) 04/13/2020   HGBA1C 6.5 (H) 04/13/2020   Results for STEVANA, DUFNER (MRN [id number]) as of 04/13/2020 12:01  Ref. Range 04/12/2020 19:38 04/13/2020 00:13 04/13/2020 05:48 04/13/2020 06:23 04/13/2020 07:28  Glucose-Capillary Latest Ref Range: 70 - 99 mg/dL 268 (H) 90 53 (L) 187 (H) 136 (H)   Review of Glycemic Control  Diabetes history: type 2 Outpatient Diabetes medications: Metformin 500 mg BID Current orders for Inpatient glycemic control: Lantus 8 units at HS, Novolog SENSITIVE correction scale every 4 hours.   Inpatient Diabetes Program Recommendations:   Noted that patient had a low CBG of 53 mg/dl at 0548. Recommend decreasing Levemir to 5 units at HS or discontinuing Levemir and continuing the Novolog correction scale as ordered.   Harvel Ricks RN BSN CDE Diabetes Coordinator Pager: [phone]  8am-5pm

## 2020-04-13 NOTE — Progress Notes (Signed)
PROGRESS NOTE  [name] VQQ:[id number] DOB: 1931-06-19 DOA: 04/12/2020 PCP: Marin Olp, MD  Brief History   86 year old woman PMH advanced dementia, lives at home with multiple family members providing care, presented with fever, decreased mentation.  Admitted for sepsis.  A & P  Sepsis, unclear source, favor pneumonia.  MRI pelvis was limited but showed no evidence of osteomyelitis.  Urine culture is pending and urinalysis was somewhat abnormal.  We will follow-up. --Treat pneumonia with empiric antibiotic, given clinical improvement, will not add atypical coverage. --Follow-up culture data  Acute metabolic encephalopathy superimposed on advanced baseline dementia, metabolic encephalopathy secondary to sepsis --More alert now.  Can resume Seroquel, citalopram, donepezil and memantine  Diabetes mellitus type 2 with hyperglycemia.  Hemoglobin A1c 6.5. --Hold Metformin.  Continue sliding scale insulin. Stop Levemir given hypoglycemia.   Hypernatremia, hypokalemia --Sodium somewhat improved.  Continue fluids.  Replete potassium.  Magnesium within normal limits.  Constipation, stool impaction --Miralax.  Covid January 2021, recovered  Wound type: 1. Unstageable Pressure Injury: 1cm x 1cm x 0.1cm; 100% soft grey eschar 2. Deep Tissue Pressure Injury: 2.0cm x 1.5cm x 0cm; 100% dark purple non blanchable tissue 3. Stage 1 Pressure injury: appears to blanch however most of the site Pressure Injury POA: Yes Measurement:see above Wound bed: see above  Drainage (amount, consistency, odor) none Periwound: intact  Dressing procedure/placement/frequency: 1. Silicone foam to the right and left heel; protection 2. Add prevalon boots bilaterally, teach CG to use in the home; to be taken home with patient 3. Add enzymatic debridement to the sacral wound daily 4. Add air mattress for pressure relief and moisture management 5. purewick in place for management of urinary  incontinence  Disposition Plan:  Discussion: Improving.  Continue antibiotics.  Follow-up culture data.  May be able to go home tomorrow if improving.  Status is: Inpatient  Remains inpatient appropriate because:IV treatments appropriate due to intensity of illness or inability to take PO   Dispo: The patient is from: Home              Anticipated d/c is to: Home              Anticipated d/c date is: 1 day              Patient currently is not medically stable to d/c.  DVT prophylaxis: enoxaparin (LOVENOX) injection 30 mg Start: 04/12/20 2300 Code Status: DNR Family Communication: daughter at bedside  Murray Hodgkins, MD  [facility] Direct contact: see [email or web address] (further directions at bottom of note if needed) 7PM-7AM contact night coverage as at bottom of note 04/13/2020, 5:51 PM  LOS: 1 day    Significant Diagnostic Tests:  . CT abdomen pelvis large amount of stool in rectum.  Chronic progressive bronchiectasis left lung base.  Bladder stone.   Micro Data:  . Blood cultures pending . Urine culture pending   Antimicrobials:  . Ceftriaxone 6/23 >  Interval History/Subjective  Daughter at bedside.  Reports patient is doing better.  Eating well.  Hoping can go home tomorrow. Patient seems to feel well.  Confused to baseline.  Objective   Vitals:  Vitals:   04/13/20 0858 04/13/20 1233  BP: (!) 155/74 (!) 153/73  Pulse: (!) 59 65  Resp: 14 14  Temp: 97.6 F (36.4 C) 97.8 F (36.6 C)  SpO2: 98% 98%    Exam:  Constitutional.  Appears calm, comfortable eating some crackers. Respiratory.  Clear to auscultation bilaterally.  No wheezes, rales  or rhonchi.  Normal respiratory effort. Cardiovascular.  Regular rate and rhythm.  No murmur, rub or gallop.  No lower extremity edema noted.  Feet in Prevalon boots. Abdomen.  Soft. Psychiatric.  Confused.  I have personally reviewed the following:   Today's Data  . CBG stable . Potassium 2.9, remainder BMP  unremarkable.  Magnesium within normal limits. . Hemoglobin lower at 12.0, likely from fluids.  Scheduled Meds: . brimonidine  1 drop Both Eyes BID  . citalopram  10 mg Oral Daily  . collagenase  1 application Topical Daily  . donepezil  10 mg Oral QHS  . enoxaparin (LOVENOX) injection  30 mg Subcutaneous Q24H  . insulin aspart  0-9 Units Subcutaneous Q4H  . memantine  10 mg Oral BID  . [START ON 04/14/2020] polyethylene glycol  17 g Oral Daily  . QUEtiapine  12.5-25 mg Oral QHS   Continuous Infusions: . sodium chloride 100 mL/hr at 04/13/20 1119  . cefTRIAXone (ROCEPHIN)  IV 1 g (04/13/20 0308)    Principal Problem:   Sepsis ([place]) Active Problems:   Diabetes ([place])   Hypertension associated with diabetes ([place])   Atherosclerosis of native arteries of extremity with intermittent claudication (HCC)   Alzheimer's disease ([place])   Protein-calorie malnutrition ([place])   Hypernatremia   Unstageable pressure ulcer of sacral region ([place])   Pneumonia   Acute metabolic encephalopathy   LOS: 1 day   How to contact the [facility] Attending or Consulting provider 7A - 7P or covering provider during after hours [place], for this patient?  1. Check the care team in [facility] and look for a) attending/consulting TRH provider listed and b) the [facility] team listed 2. Log into [email or web address] and use [place]'s universal password to access. If you do not have the password, please contact the hospital operator. 3. Locate the [facility] provider you are looking for under [facility] and page to a number that you can be directly reached. 4. If you still have difficulty reaching the provider, please page the [facility] (Director on Call) for the Hospitalists listed on amion for assistance.

## 2020-04-13 NOTE — Consult Note (Signed)
I have placed a request via Secure Chat to Dr. Sarajane Jews requesting photos of the wound areas of concern to be placed in the EMR.   [place], [place], [place]

## 2020-04-13 NOTE — Evaluation (Signed)
Physical Therapy Evaluation Patient Details Name: [name] MRN: [id number] DOB: Aug 09, 1931 Today's Date: 04/13/2020   History of Present Illness  86 y.o. F with hx dementia, home dwelling, DM, and HTN who presents with few days decreased mentation and now fever. Caveat that all history collected from daughter at the bedside as the patient is too altered to respond. Patient was in her usual state of health until a few days ago, family started to notice she was weaker, unable to get out of bed, less responsive.  Then today she was febrile so she was brought to the emergency room.  Family have noticed no cough or respiratory symptoms.  They have noticed no change in her urination.  Clinical Impression  Pt admitted with above diagnosis. Pt lethargic during evaluation, difficult to arouse, but will follow commands appropriately and reports "I don't want to" when cued by therapist. Pt's daughter in room motivating pt and informing therapist of PLOF. Pt requiring max A x2 for bed mobility at this time, possibly due to lethargy level at this time. Pt with significant family support and assist at home, goal is to return with established caregivers in place. No equipment rec currently, will continue to assess with pt improvement. Pt currently with functional limitations due to the deficits listed below (see PT Problem List). Pt will benefit from skilled PT to increase their independence and safety with mobility to allow discharge to the venue listed below.       Follow Up Recommendations Home health PT;Supervision/Assistance - 24 hour    Equipment Recommendations  Other (comment) (TBD with progress)    Recommendations for Other Services       Precautions / Restrictions Precautions Precautions: Fall Restrictions Weight Bearing Restrictions: No      Mobility  Bed Mobility Overal bed mobility: Needs Assistance Bed Mobility: Supine to Sit;Sit to Supine;Rolling Rolling: Max assist  Supine to sit:  Max assist;+2 for physical assistance Sit to supine: Max assist;+2 for physical assistance  General bed mobility comments: constant cues for hand placement and sequencing, pt slides BLE towards EOB 2-3 inches; max A lifting BLE back into bed  Transfers  General transfer comment: unable 2* fatigue  Ambulation/Gait  General Gait Details: unable 2* fatigue  Stairs            Wheelchair Mobility    Modified Rankin (Stroke Patients Only)       Balance Overall balance assessment: Needs assistance Sitting-balance support: Feet supported;No upper extremity supported Sitting balance-Leahy Scale: Poor Sitting balance - Comments: mod-min A seated EOB with cues          Pertinent Vitals/Pain Pain Assessment: No/denies pain    Home Living Family/patient expects to be discharged to:: Private residence Living Arrangements: Spouse/significant other Available Help at Discharge: Family;Personal care attendant;Available 24 hours/day Type of Home: House Home Access: Ramped entrance     Home Layout: Able to live on main level with bedroom/bathroom Home Equipment: Walker - 2 wheels;Hospital bed      Prior Function Level of Independence: Needs assistance   Gait / Transfers Assistance Needed: CNA assists with getting pt out of bed, pt amb around home with bil platform RW  ADL's / Homemaking Assistance Needed: CNA M-F 9-1 provides with bathing, dressing, cooking meals; family feeds pt most days  Comments: Pt sleeps in hospital bed in home office with spouse. Hired high school aged "babysitters" in home 1-5pm engaging in games and activities with pt. Pt's 3 daughters provide nighttime routine and get  pt in bed. Family cares for pt on Sat/Sun.     Hand Dominance        Extremity/Trunk Assessment   Upper Extremity Assessment Upper Extremity Assessment: Defer to OT evaluation    Lower Extremity Assessment Lower Extremity Assessment: RLE deficits/detail;LLE  deficits/detail;Difficult to assess due to impaired cognition RLE Deficits / Details: AROM <50%, strength grossly 2+/5 LLE Deficits / Details: AROM <50%, strength grossly 2+/5    Cervical / Trunk Assessment Cervical / Trunk Assessment: Kyphotic  Communication      Cognition Arousal/Alertness: Lethargic Behavior During Therapy: Flat affect Overall Cognitive Status: History of cognitive impairments - at baseline  General Comments: Daughter in room encouraging pt to wake up and work with therapy, pt responds to questions and commands with increased time.      General Comments      Exercises     Assessment/Plan    PT Assessment Patient needs continued PT services  PT Problem List Decreased strength;Decreased range of motion;Decreased activity tolerance;Decreased balance;Decreased mobility;Decreased coordination;Decreased cognition;Decreased knowledge of use of DME;Decreased safety awareness;Pain       PT Treatment Interventions DME instruction;Gait training;Functional mobility training;Therapeutic activities;Therapeutic exercise;Balance training;Neuromuscular re-education;Cognitive remediation;Patient/family education    PT Goals (Current goals can be found in the Care Plan section)  Acute Rehab PT Goals Patient Stated Goal: return home with established assistance PT Goal Formulation: With family Time For Goal Achievement: 04/27/20 Potential to Achieve Goals: Good    Frequency Min 2X/week   Barriers to discharge        Co-evaluation               AM-PAC PT "6 Clicks" Mobility  Outcome Measure Help needed turning from your back to your side while in a flat bed without using bedrails?: Total Help needed moving from lying on your back to sitting on the side of a flat bed without using bedrails?: Total Help needed moving to and from a bed to a chair (including a wheelchair)?: Total Help needed standing up from a chair using your arms (e.g., wheelchair or bedside  chair)?: Total Help needed to walk in hospital room?: Total Help needed climbing 3-5 steps with a railing? : Total 6 Click Score: 6    End of Session   Activity Tolerance: Patient limited by lethargy Patient left: in bed;with call bell/phone within reach;with bed alarm set;with family/visitor present Nurse Communication: Mobility status PT Visit Diagnosis: Other abnormalities of gait and mobility (R26.89);Muscle weakness (generalized) (M62.81);Other symptoms and signs involving the nervous system (T73.220)    Time: 2542-7062 PT Time Calculation (min) (ACUTE ONLY): 35 min   Charges:   PT Evaluation $PT Eval Moderate Complexity: 1 Mod PT Treatments $Therapeutic Activity: 8-22 mins         Tori Burrell Hodapp PT, DPT 04/13/20, 1:22 PM

## 2020-04-14 DIAGNOSIS — N390 Urinary tract infection, site not specified: Secondary | ICD-10-CM

## 2020-04-14 DIAGNOSIS — G9341 Metabolic encephalopathy: Secondary | ICD-10-CM

## 2020-04-14 LAB — GLUCOSE, CAPILLARY
Glucose-Capillary: 91 mg/dL (ref 70–99)
Glucose-Capillary: 198 mg/dL — ABNORMAL HIGH (ref 70–99)
Glucose-Capillary: 147 mg/dL — ABNORMAL HIGH (ref 70–99)

## 2020-04-14 LAB — BASIC METABOLIC PANEL
Anion gap: 6 (ref 5–15)
BUN: 18 mg/dL (ref 8–23)
CO2: 26 mmol/L (ref 22–32)
Calcium: 8.6 mg/dL — ABNORMAL LOW (ref 8.9–10.3)
Chloride: 104 mmol/L (ref 98–111)
Creatinine, Ser: 0.47 mg/dL (ref 0.44–1.00)
GFR calc Af Amer: >60 mL/min (ref >60)
GFR calc non Af Amer: >60 mL/min (ref >60)
Glucose, Bld: 121 mg/dL — ABNORMAL HIGH (ref 70–99)
Potassium: 3.6 mmol/L (ref 3.5–5.1)
Sodium: 136 mmol/L (ref 135–145)

## 2020-04-14 LAB — MAGNESIUM: Magnesium: 1.7 mg/dL (ref 1.7–2.4)

## 2020-04-14 LAB — PHOSPHORUS: Phosphorus: 2.5 mg/dL (ref 2.5–4.6)

## 2020-04-14 MED ORDER — COLLAGENASE 250 UNIT/GM EX OINT: 1.0000 "application " | TOPICAL_OINTMENT | Freq: Every day | CUTANEOUS | 0 refills | Status: AC

## 2020-04-14 MED ORDER — CEFUROXIME AXETIL 500 MG PO TABS
500.0000 mg | ORAL_TABLET | Freq: Two times a day (BID) | ORAL | 0 refills | Status: AC
Start: 2020-04-15 — End: 2020-04-20

## 2020-04-14 NOTE — TOC Progression Note (Signed)
Transition of Care [facility]) - Progression Note    Patient Details  Name: [name] MRN: [id number] Date of Birth: 05-Apr-1931  [facility] CM/SW Contact  Purcell Mouton, RN Phone Number: 04/14/2020, 4:01 PM  Clinical Narrative:     Home with spouse and family.  Expected Discharge Plan: [place] Barriers to Discharge: No Barriers Identified  Expected Discharge Plan and Services Expected Discharge Plan: [place]   Discharge Planning Services: CM Consult   Living arrangements for the past 2 months: Single Family Home Expected Discharge Date: 04/14/20                         HH Arranged: PT HH Agency: [facility] Date [place]: 04/14/20 Time [place]: [id number] Representative spoke with at [place]: AMY   Social Determinants of Health ([place]) Interventions    Readmission Risk Interventions No flowsheet data found.

## 2020-04-14 NOTE — Discharge Summary (Signed)
Physician Discharge Summary  Valda Christenson GEX:[id number] DOB: 01-Oct-1931 DOA: 04/12/2020  PCP: Marin Olp, MD  Admit date: 04/12/2020 Discharge date: 04/14/2020  Recommendations for Outpatient Follow-up:   UTI.  Follow-up culture.  Wound care Foam dressing  Every 3 days  Bilateral heels and elbows  Wound care  Daily      1. Clean sacral wound with saline, pat dry. 2. Apply 1/4" thick layer of Santyl to the sacral wound, top with small saline damp dressing. Top with dry dressing. Change daily 3. Protect bilateral heel ulcers with silicone foam   Follow-up Information    Marin Olp, MD. Schedule an appointment as soon as possible for a visit.   Specialty: Family Medicine Why: as needed Contact information: [place] [address] [phone]        Leonie Man, MD .   Specialty: Cardiology Contact information: [address] [place] [address] [phone]                Discharge Diagnoses: Principal diagnosis is #1 1. Sepsis secondary to pneumonia plus minus UTI 2. Acute metabolic encephalopathy superimposed on advanced baseline dementia, metabolic encephalopathy secondary to sepsis 3. Diabetes mellitus type 2 with hyperglycemia 4. Hypernatremia, hypokalemia 5. Constipation, stool impaction 6. Unstageable Pressure Injury, Deep Tissue Pressure Injury, Stage 1 Pressure injury  Discharge Condition: improved Disposition: home  Diet recommendation: diabetic diet  Filed Weights   04/12/20 2130  Weight: 53.4 kg    History of present illness:  86 year old woman PMH advanced dementia, lives at home with multiple family members providing care, presented with fever, decreased mentation.  Admitted for sepsis.  Hospital Course:  Patient was treated with empiric antibiotics, source was unclear but favored to be pneumonia and/or UTI. Rapidly improved with ceftriaxone alone. Urine culture currently growing  Klebsiella, sensitivities pending, but given rapid clinical improvement and lack of complicating features, will discharge home on empiric Ceftin.  Sepsis, secondary to pneumonia plus minus UTI.  MRI pelvis was limited but showed no evidence of osteomyelitis.  Urine culture is pending and urinalysis was somewhat abnormal.  We will follow-up. --Treat pneumonia with empiric antibiotic, given clinical improvement, will not add atypical coverage. --Follow-up culture data  Acute metabolic encephalopathy superimposed on advanced baseline dementia, metabolic encephalopathy secondary to sepsis --Appears to be at baseline Can resume Seroquel, citalopram, donepezil and memantine  Diabetes mellitus type 2 with hyperglycemia.  Hemoglobin A1c 6.5. --Stable. Can resume Metformin on discharge.    Hypernatremia, hypokalemia --Sodium has normalized. Potassium within normal limits. Magnesium and phosphorus within normal limits.  Constipation, stool impaction --Multiple bowel movements noted.  Covid January 2021, recovered  Wound type: 1. Unstageable Pressure Injury: 1cm x 1cm x 0.1cm; 100% soft grey eschar 2. Deep Tissue Pressure Injury: 2.0cm x 1.5cm x 0cm; 100% dark purple non blanchable tissue 3. Stage 1 Pressure injury: appears to blanch however most of the site Pressure Injury POA: Yes Measurement:see above Wound bed:see above Drainage (amount, consistency, odor)none Periwound:intact Dressing procedure/placement/frequency: 1. Silicone foam to the right and left heel; protection 2. Add prevalon boots bilaterally, teach CG to use in the home; to be taken home with patient 3. Add enzymatic debridement to the sacral wound daily  Significant Diagnostic Tests:   CT abdomen pelvis large amount of stool in rectum.  Chronic progressive bronchiectasis left lung base.  Bladder stone.  Micro Data:   Blood cultures pending  Urine culture pending  Antimicrobials:   Ceftriaxone 6/23 >  6/25  Ceftin 6/26 > 6/29   Today's assessment: S: per RN doing well, eating well Family reports patient improving, doing fine, ok to take home. O: Vitals:  Vitals:   04/14/20 0548 04/14/20 1504  BP: 135/62 (!) 158/66  Pulse: 62 60  Resp: 18 16  Temp: 97.8 F (36.6 C) 97.9 F (36.6 C)  SpO2: 96% 98%    Constitutional:  . Appears calm and comfortable Respiratory:  . CTA bilaterally, no w/r/r.  . Respiratory effort normal. Cardiovascular:  . RRR, no m/r/g . No LE extremity edema   Abdomen:  . Abdomen appears normal; no tenderness  Musculoskeletal:  . RUE, LUE squeezes with both hands Psychiatric:  . Mental status o Mood, affect appropriate  CBG stable  Discharge Instructions  Discharge Instructions    Call MD for:  redness, tenderness, or signs of infection (pain, swelling, redness, odor or green/yellow discharge around incision site)   Complete by: As directed    Diet Carb Modified   Complete by: As directed    Discharge instructions   Complete by: As directed    Call your physician or seek immediate medical attention for fever, confusion, poor appetite, worsening of condition.  Be sure to finish antibiotic.   Discharge wound care:   Complete by: As directed    Foam dressing  Every 3 days  Bilateral heels and elbows  Wound care  Daily      1. Clean sacral wound with saline, pat dry. 2. Apply 1/4" thick layer of Santyl to the sacral wound, top with small saline damp dressing. Top with dry dressing. Change daily 3. Protect bilateral heel ulcers with silicone foam   Increase activity slowly   Complete by: As directed      Allergies as of 04/14/2020   No Known Allergies     Medication List    TAKE these medications   acetaminophen 500 MG tablet Commonly known as: TYLENOL Take 500 mg by mouth every 8 (eight) hours as needed.   albuterol 108 (90 Base) MCG/ACT inhaler Commonly known as: ProAir HFA 2 PUFFS EVERY 6 HOURS AS NEEDED FOR SHORTNESS OF  BREATH. What changed:   how much to take  how to take this  when to take this  reasons to take this  additional instructions   albuterol (2.5 MG/3ML) 0.083% nebulizer solution Commonly known as: PROVENTIL USE THREE MILLILITERS VIA NEBULIZATION BY MOUTH EVERY 6 HOURS AS NEEDED FOR WHEEZING OR SHORTNESS OF BREATH What changed: See the new instructions.   Alphagan P 0.1 % Soln Generic drug: brimonidine Place 1 drop into both eyes 2 (two) times daily.   amLODipine 10 MG tablet Commonly known as: NORVASC Take 1 tablet (10 mg total) by mouth daily.   ascorbic acid 500 MG tablet Commonly known as: VITAMIN C Take 500 mg by mouth 2 (two) times daily.   aspirin 81 MG tablet Take 81 mg by mouth daily.   budesonide 0.25 MG/2ML nebulizer solution Commonly known as: PULMICORT TAKE 2 MILLILITERS VIA NEBULIZATION DAILY AS DIRECTED What changed: See the new instructions.   calcium carbonate 600 MG Tabs tablet Commonly known as: OS-CAL Take 600 mg by mouth 2 (two) times daily with a meal.   cefUROXime 500 MG tablet Commonly known as: CEFTIN Take 1 tablet (500 mg total) by mouth 2 (two) times daily for 5 days. Pharmacist: Ensure med can be crushed. Start taking on: April 15, 2020   citalopram 10 MG tablet Commonly known as: CELEXA TAKE  1 TABLET ONCE DAILY.   collagenase ointment Commonly known as: SANTYL Apply 1 application topically daily. Apply to affected area Start taking on: April 15, 2020   donepezil 10 MG tablet Commonly known as: ARICEPT TAKE ONE TABLET AT BEDTIME.   famotidine 20 MG tablet Commonly known as: PEPCID TAKE ONE TABLET TWICE A DAY AS NEEDED FOR HEARTBURN OR INDIGESTION. What changed: See the new instructions.   ferrous sulfate 325 (65 FE) MG EC tablet Take 325 mg by mouth daily with breakfast.   memantine 10 MG tablet Commonly known as: NAMENDA TAKE 1 TABLET TWICE DAILY FOR MEMORY. What changed: See the new instructions.   metFORMIN 500 MG  tablet Commonly known as: GLUCOPHAGE Take 1 tablet (500 mg total) by mouth 2 (two) times daily with a meal.   multivitamin with minerals Tabs tablet Take 1 tablet by mouth daily.   Phospha 250 Neutral [id number] MG Tabs TAKE 1 TABLET BY MOUTH DAILY.   QUEtiapine 25 MG tablet Commonly known as: SEROquel Take 0.5-1 tablets (12.5-25 mg total) by mouth at bedtime. As needed for sleep and agitation if not able to sleep for 24-48 hours.   senna-docusate 8.6-50 MG tablet Commonly known as: Senokot-S Take 1 tablet by mouth 2 (two) times daily between meals as needed for mild constipation.   traMADol 50 MG tablet Commonly known as: ULTRAM Take 25 mg by mouth every 8 (eight) hours as needed for moderate pain.            Discharge Care Instructions  (From admission, onward)         Start     Ordered   04/14/20 0000  Discharge wound care:       Comments: Foam dressing  Every 3 days  Bilateral heels and elbows  Wound care  Daily      1. Clean sacral wound with saline, pat dry. 2. Apply 1/4" thick layer of Santyl to the sacral wound, top with small saline damp dressing. Top with dry dressing. Change daily 3. Protect bilateral heel ulcers with silicone foam   82/50/53 1525         No Known Allergies  The results of significant diagnostics from this hospitalization (including imaging, microbiology, ancillary and laboratory) are listed below for reference.    Significant Diagnostic Studies: DG Chest 2 View  Result Date: 04/12/2020 CLINICAL DATA:  Fever. Weakness. Altered mental status. Suspected sepsis. EXAM: CHEST - 2 VIEW COMPARISON:  Chest x-rays dated 11/03/2019 and 10/20/2019 and CT scan of the chest dated 08/28/2016 and CT scan of the abdomen dated 04/12/2020 FINDINGS: Heart size and pulmonary vascularity are normal. Slight progression of chronic changes at the left lung base as described on the CT scan of the abdomen on 04/12/2020. No acute infiltrates or effusions. No  acute bone abnormality. IMPRESSION: Slight progression of chronic changes at the left lung base. No acute abnormalities. Electronically Signed   By: Lorriane Shire M.D.   On: 04/12/2020 14:11   MR PELVIS WO CONTRAST  Result Date: 04/12/2020 CLINICAL DATA:  Fever, weakness, concern for osteomyelitis of the hip. EXAM: MRI PELVIS WITHOUT CONTRAST TECHNIQUE: Multiplanar multisequence MR imaging of the pelvis was performed. No intravenous contrast was administered. COMPARISON:  None. FINDINGS: Extremely limited examination secondary to severe patient motion. Only 2 sequences were obtained in the coronal plane, but only 1 is of diagnostic value. Bones/Joint/Cartilage No marrow signal abnormality. No fracture or dislocation. Normal alignment. No joint effusion. Mild osteoarthritis of bilateral hips. No periosteal  reaction or bone destruction. Ligaments,Muscles and Tendons Muscles are normal. Soft tissue No fluid collection or hematoma.  No soft tissue mass. IMPRESSION: 1. Extremely limited examination secondary to severe patient motion. Only 2 sequences were obtained in the coronal plane, but only 1 is of diagnostic value. 2. No definite evidence of osteomyelitis of the pelvis or hips. Electronically Signed   By: Kathreen Devoid   On: 04/12/2020 19:27   CT ABDOMEN PELVIS W CONTRAST  Result Date: 04/12/2020 CLINICAL DATA:  Fever and abdominal tenderness. EXAM: CT ABDOMEN AND PELVIS WITH CONTRAST TECHNIQUE: Multidetector CT imaging of the abdomen and pelvis was performed using the standard protocol following bolus administration of intravenous contrast. CONTRAST:  17mL OMNIPAQUE IOHEXOL 300 MG/ML  SOLN COMPARISON:  CT scan dated 11/03/2019 and 08/28/2016 FINDINGS: Lower chest: Chronic progressive bronchiectasis, peribronchial thickening and inflammatory changes peripherally at the left lung base with a tiny chronic left pleural effusion. Stable small pulmonary nodules in the right lung base since the prior CT scan of  2017. Heart size is normal. Chronic moderate hiatal hernia. Hepatobiliary: Liver parenchyma is normal. The gallbladder is slightly distended. No dilated bile ducts. Pancreas: Severe diffuse pancreatic atrophy with diffuse cystic changes throughout the entire pancreas, unchanged. Chronic calcifications in the pancreatic head and uncinate process. Spleen: Calcified granulomas.  Otherwise negative. Adrenals/Urinary Tract: Normal adrenal glands. Multiple chronic peripelvic and renal cysts on the left. Chronic slight dilatation of the proximal right ureter without hydronephrosis. 4 mm stone in the dependent portion of the left side of the bladder. Stomach/Bowel: Moderate chronic hiatal hernia. Small bowel appears normal. Appendix is normal. Moderate amount of stool scattered throughout the colon with a large amount of stool in the rectum consistent with fecal impaction. There are few diverticula in the sigmoid portion of the colon without evidence of diverticulitis. Vascular/Lymphatic: Aortic atherosclerosis. No enlarged abdominal or pelvic lymph nodes. Reproductive: Uterus and bilateral adnexa are unremarkable. Other: No abdominal wall hernia or abnormality. No abdominopelvic ascites. Musculoskeletal: No acute or significant osseous findings. IMPRESSION: 1. Large amount of stool in the rectum consistent with fecal impaction. 2. Chronic progressive bronchiectasis, peribronchial thickening and inflammatory changes peripherally at the left lung base with a tiny chronic left pleural effusion. 3. Chronic moderate hiatal hernia. 4. Chronic slight dilatation of the proximal right ureter without hydronephrosis. 5. 4 mm stone in the dependent portion of the left side of the bladder. 6. Aortic atherosclerosis. Aortic Atherosclerosis (ICD10-I70.0). Electronically Signed   By: Lorriane Shire M.D.   On: 04/12/2020 14:07    Microbiology: Recent Results (from the past 240 hour(s))  Culture, blood (Routine x 2)     Status: None  (Preliminary result)   Collection Time: 04/12/20 11:58 AM   Specimen: BLOOD  Result Value Ref Range Status   Specimen Description   Final    BLOOD LEFT ANTECUBITAL Performed at [facility] [address] [place], [place] [address]    Special Requests   Final    BOTTLES DRAWN AEROBIC AND ANAEROBIC Blood Culture results may not be optimal due to an inadequate volume of blood received in culture bottles Performed at [facility] [address] [place], [place] [address]    Culture   Final    NO GROWTH 2 DAYS Performed at [facility] [address], [place], [place] [address]    Report Status PENDING  Incomplete  Culture, blood (Routine x 2)     Status: None (Preliminary result)  Collection Time: 04/12/20 12:10 PM   Specimen: BLOOD RIGHT HAND  Result Value Ref Range Status   Specimen Description   Final    BLOOD RIGHT HAND Performed at [facility], [place] [address], [place], [place] [address]    Special Requests   Final    BOTTLES DRAWN AEROBIC AND ANAEROBIC Blood Culture adequate volume Performed at [facility] [address] [place], [place] [address]    Culture   Final    NO GROWTH 2 DAYS Performed at [facility] [address], [place], [place] [address]    Report Status PENDING  Incomplete  Urine culture     Status: Abnormal (Preliminary result)   Collection Time: 04/12/20  2:12 PM   Specimen: Urine, Clean Catch  Result Value Ref Range Status   Specimen Description   Final    URINE, CLEAN CATCH Performed at [facility], [place] [address] [place], [place] [address]    Special Requests   Final    NONE Performed at [facility], [place] [address], [place], [place] [address]    Culture (A)  Final    40,000 COLONIES/mL KLEBSIELLA PNEUMONIAE SUSCEPTIBILITIES TO FOLLOW Performed at [facility], [place] [address], [place], [place]  [address]    Report Status PENDING  Incomplete  SARS Coronavirus 2 by RT PCR (hospital order, performed in [facility] hospital lab) Nasopharyngeal Nasopharyngeal Swab     Status: None   Collection Time: 04/12/20 11:30 PM   Specimen: Nasopharyngeal Swab  Result Value Ref Range Status   SARS Coronavirus 2 NEGATIVE NEGATIVE Final    Comment: (NOTE) SARS-CoV-2 target nucleic acids are NOT DETECTED.  The SARS-CoV-2 RNA is generally detectable in upper and lower respiratory specimens during the acute phase of infection. The lowest concentration of SARS-CoV-2 viral copies this assay can detect is 250 copies / mL. A negative result does not preclude SARS-CoV-2 infection and should not be used as the sole basis for treatment or other patient management decisions.  A negative result may occur with improper specimen collection / handling, submission of specimen other than nasopharyngeal swab, presence of viral mutation(s) within the areas targeted by this assay, and inadequate number of viral copies (<250 copies / mL). A negative result must be combined with clinical observations, patient history, and epidemiological information.  Fact Sheet for Patients:   StrictlyIdeas.no  Fact Sheet for Healthcare Providers: BankingDealers.co.za  This test is not yet approved or  cleared by the [place] FDA and has been authorized for detection and/or diagnosis of SARS-CoV-2 by FDA under an Emergency Use Authorization (EUA).  This EUA will remain in effect (meaning this test can be used) for the duration of the COVID-19 declaration under Section 564(b)(1) of the Act, 21 U.S.C. section 360bbb-3(b)(1), unless the authorization is terminated or revoked sooner.  Performed at [facility], [place] [address], [place],  [address]      Labs: Basic Metabolic Panel: Recent Labs  Lab 04/12/20 1158 04/13/20 0440 04/14/20 0434  NA 150*  148* 136  K 3.9 2.9* 3.6  CL 111 112* 104  CO2 26 28 26   GLUCOSE 261* 63* 121*  BUN 35* 27* 18  CREATININE 1.05* 0.68 0.47  CALCIUM 9.7 8.9 8.6*  MG  --  1.9 1.7  PHOS  --   --  2.5   Liver Function Tests: Recent Labs  Lab 04/12/20 1158  AST 28  ALT 34  ALKPHOS 56  BILITOT 0.7  PROT 7.4  ALBUMIN 3.3*   CBC: Recent Labs  Lab 04/12/20 1158 04/13/20 0440  WBC 11.7* 13.0*  NEUTROABS 9.4*  --   HGB 13.6 12.0  HCT 42.5 37.6  MCV 101.4* 101.6*  PLT 368 332    CBG: Recent Labs  Lab 04/13/20 1229 04/13/20 1644 04/13/20 2027 04/14/20 0743 04/14/20 1150  GLUCAP 150* 106* 192* 91 198*    Principal Problem:   Sepsis ([facility]) Active Problems:   Diabetes ([place])   Hypertension associated with diabetes ([place])   Atherosclerosis of native arteries of extremity with intermittent claudication ([facility])   Alzheimer's disease ([place])   Protein-calorie malnutrition ([place])   Hypernatremia   Unstageable pressure ulcer of sacral region ([address])   Pneumonia   Acute metabolic encephalopathy   Time coordinating discharge: 35 minutes  Signed:  Murray Hodgkins, MD  [facility]  04/14/2020, 3:25 PM

## 2020-04-14 NOTE — Plan of Care (Signed)
Discharge instructions reviewed with daughter, questions answered, verbalized understanding. Patient awaiting [facility] for transport.

## 2020-04-14 NOTE — Progress Notes (Signed)
    Durable Medical Equipment  (From admission, onward)         Start     Ordered   04/14/20 1611  For home use only DME Hospital bed  Once       Comments: Full Electric bed.  Question Answer Comment  Length of Need Lifetime   Patient has (list medical condition): Sepsis, UTI, Acute Metabolic Encephalopathy, Alzheimer's disease, Diabetes   The above medical condition requires: Patient requires the ability to reposition frequently   Head must be elevated greater than: 45 degrees   Bed type Semi-electric      04/14/20 1614

## 2020-04-14 NOTE — Progress Notes (Signed)
Patient discharge at this time via PTAR in no acute episode, accompanied by daughter.

## 2020-04-14 NOTE — TOC Progression Note (Signed)
Transition of Care [facility]) - Progression Note    Patient Details  Name: [name] MRN: [id number] Date of Birth: 15-Feb-1931  [facility] CM/SW Contact  Purcell Mouton, RN Phone Number: 04/14/2020, 3:50 PM  Clinical Narrative:    Corey Harold was called pt is 10th in line for pick up in 2 to 3 hours. Daughter Adria at bedside made aware. [employer] was called to check on electric bed. Pt will need a credit card on file to charge $55/month.  This information was given to daughter. Adria asked that [employer] call her father/pt's spouse. Also pt was active with [facility], referral was called to in house rep.         Expected Discharge Plan and Services           Expected Discharge Date: 04/14/20                                     Social Determinants of Health (SDOH) Interventions    Readmission Risk Interventions No flowsheet data found.

## 2020-04-15 DIAGNOSIS — M6281 Muscle weakness (generalized): Secondary | ICD-10-CM | POA: Diagnosis not present

## 2020-04-15 DIAGNOSIS — U071 COVID-19: Secondary | ICD-10-CM | POA: Diagnosis not present

## 2020-04-15 DIAGNOSIS — E1165 Type 2 diabetes mellitus with hyperglycemia: Secondary | ICD-10-CM | POA: Diagnosis not present

## 2020-04-15 DIAGNOSIS — S3091XD Unspecified superficial injury of lower back and pelvis, subsequent encounter: Secondary | ICD-10-CM | POA: Diagnosis not present

## 2020-04-15 DIAGNOSIS — G309 Alzheimer's disease, unspecified: Secondary | ICD-10-CM | POA: Diagnosis not present

## 2020-04-15 DIAGNOSIS — F0280 Dementia in other diseases classified elsewhere without behavioral disturbance: Secondary | ICD-10-CM | POA: Diagnosis not present

## 2020-04-15 LAB — URINE CULTURE: Culture: 40000 — AB

## 2020-04-16 ENCOUNTER — Telehealth: Payer: Self-pay | Admitting: Family Medicine

## 2020-04-16 NOTE — Telephone Encounter (Signed)
Notified by micro of 1/4 blood culture positive for gram positive rod.  Spoke with patient's daughter [name], reports overall her manage doing well.  Patient will complete treatment for UTI with Ceftin as already prescribed.  I suspect the blood culture is a contaminant and will not change management at this point.  We will follow-up final culture results. Murray Hodgkins, MD [facility]

## 2020-04-17 LAB — CULTURE, BLOOD (ROUTINE X 2)
Culture: NO GROWTH
Special Requests: ADEQUATE

## 2020-04-18 ENCOUNTER — Encounter: Payer: Self-pay | Admitting: *Deleted

## 2020-04-18 ENCOUNTER — Other Ambulatory Visit: Payer: Self-pay | Admitting: *Deleted

## 2020-04-18 DIAGNOSIS — U071 COVID-19: Secondary | ICD-10-CM | POA: Diagnosis not present

## 2020-04-18 DIAGNOSIS — M6281 Muscle weakness (generalized): Secondary | ICD-10-CM | POA: Diagnosis not present

## 2020-04-18 DIAGNOSIS — G309 Alzheimer's disease, unspecified: Secondary | ICD-10-CM | POA: Diagnosis not present

## 2020-04-18 DIAGNOSIS — F0280 Dementia in other diseases classified elsewhere without behavioral disturbance: Secondary | ICD-10-CM | POA: Diagnosis not present

## 2020-04-18 DIAGNOSIS — S3091XD Unspecified superficial injury of lower back and pelvis, subsequent encounter: Secondary | ICD-10-CM | POA: Diagnosis not present

## 2020-04-18 DIAGNOSIS — E1165 Type 2 diabetes mellitus with hyperglycemia: Secondary | ICD-10-CM | POA: Diagnosis not present

## 2020-04-18 LAB — CULTURE, BLOOD (ROUTINE X 2)

## 2020-04-18 NOTE — Patient Outreach (Signed)
[name]) Care Management  04/18/2020  [name] 1931-03-19 [id number]   EMMI-general discharge RED ON EMMI ALERT Day # 1 Date:  Monday 04/17/20 1000 Red Alert Reason: Scheduled follow up? No   insurance NextGen Medicare  [facility] admissions x 3 ED visits x 3 in the last 6 months -ED x 4 admissions x 3 in last year Last admission 04/12/20 04/14/20 with [facility] and a hospital bed   Outreach attempt #1 successful Patient is able to verify HIPAA, DOB and address [place] Management RN reviewed and addressed red alert with patient    Transition of care services noted to be completed by primary care MD office staff-[facility] Transition of Care will be completed by primary care provider office who will refer to [facility] care management if needed.  EMMI:  [name] confirms the EMMI answer was correct The hospital follow up appointments with the primary care provider (PCP) and cardiologist  have not been made Questions answered about hospital follow up appointments     Sacral wound  States patient continues to have sacral pain and does not tolerate the donut hole but is scheduled at 10 PM this evening to have an air mattress delivered Sacral wound daily care  clean with saline Pat dry and apply santyl with saline damp dressing then a dry dressing   EMMI information sent via listed Epic e mail address - recovering from sepsis: full program, urinary tract infections in adults, type 2 diabetes: taking care of yourself day to day, Dementia" basics, high blood pressure(Hypertension): what you can do  Social: [name] is an 86 year old retired patient who lives at home with her husband, [name] (former CEO of [employer]) their dog and other family members They have been married for "76 five years" Her family members provide all care and transportation needs Have hired staff to assist during the  week She has not driven since 1779 She had 2 years of college [name] reports a poor appetite and an intake of Ensue supplement qid   Conditions: Sepsis secondary to  pneumonia (PNA) plus minus urinary tract infection (UTI)  Acute Metabolic Encephalopathy superimposed on advanced baseline dementia, Alzheimer's disease, Diabetes mellitus type 2 with hyperglycemia, Hypertension (HTN) Asthma, GERD (gastroesophageal reflux disease), Hyperlipidemia (HLD), open angle left eye glaucoma,  hypernatremia, hypokalemia, constipation, stool impaction, unstagable pressure deep tissue pressure injury/Sacral wound, bilateral heel and elbow ulcers, anemia, arthritis, hx of wrist fracture with surgery never smoker allergies, venous insufficiency    TJQ:ZESPQZRA bed bilateral heel and elbow protectors, wound supplies cane nebulizer, rolling walker-2 wheels, ramp entrance   Medications: denies concerns with taking medications as prescribed, affording medications, side effects of medications and questions about medications    Appointments: Previous history of video appointment with pcp  No scheduled appointments  Recommended appointments at discharge with pcp and cardiologist   Spouse reports no assist needed from [place] CM at this time with appointments   Advance Directives: Denies need for assist with advance directives    Consent: [facility] RN CM reviewed [facility] services with patient. Patient gave verbal consent for services [facility] telephonic RN CM.   Advised patient that there will be further automated EMMI- post discharge calls to assess how the patient is doing following the recent hospitalization Advised the patient that another call may be received from a nurse if any of their responses were abnormal. Patient voiced understanding  and was appreciative of f/u call.   Plan: [facility] RN CM will follow up with [name] within the next 21-25 business days as agreed [facility] RN CM sent a [employer] with  [employer] brochure, Magnet, [facility] consent form with return envelope and know before you go sheet enclosed for review Pt encouraged to return a call to [facility] RN CM prn Routed note to MD- MD involvement barriers letter sent     [facility] CM Care Plan Problem One     Most Recent Value  Care Plan Problem One Knowledge deficit of home care for DM & HTN  Role Documenting the Problem One Care Management Telephonic Coordinator  Care Plan for Problem One Active  [facility] Long Term Goal  over the next 90 days patient/family will be able to verbalize with outreach interventions to manage DM & HTN duirn future outreach calls  [facility] Long Term Goal Start Date 04/18/20  Interventions for Problem One Long Term Goal completed EMMI assessment, ansered questions discussed importance of MD hospital follow upOffer assist with scheduling appointments but denies need help  [facility] CM Short Term Goal #1  over the next 30 days patient with complete recommend hospital follow MD visits to pcp and cardiologist as verbalized during future outreach calls  [facility] CM Short Term Goal #1 Start Date 04/18/20       Megan Millin L. Lavina Hamman, RN, BSN, [place] Coordinator Office number [phone] Mobile number [phone]  Main [facility] number [phone] Fax number [phone]

## 2020-04-19 ENCOUNTER — Telehealth: Payer: Self-pay | Admitting: Family Medicine

## 2020-04-19 DIAGNOSIS — U071 COVID-19: Secondary | ICD-10-CM | POA: Diagnosis not present

## 2020-04-19 DIAGNOSIS — F0280 Dementia in other diseases classified elsewhere without behavioral disturbance: Secondary | ICD-10-CM | POA: Diagnosis not present

## 2020-04-19 DIAGNOSIS — E1165 Type 2 diabetes mellitus with hyperglycemia: Secondary | ICD-10-CM | POA: Diagnosis not present

## 2020-04-19 DIAGNOSIS — M6281 Muscle weakness (generalized): Secondary | ICD-10-CM | POA: Diagnosis not present

## 2020-04-19 DIAGNOSIS — G309 Alzheimer's disease, unspecified: Secondary | ICD-10-CM | POA: Diagnosis not present

## 2020-04-19 DIAGNOSIS — S3091XD Unspecified superficial injury of lower back and pelvis, subsequent encounter: Secondary | ICD-10-CM | POA: Diagnosis not present

## 2020-04-19 NOTE — Telephone Encounter (Signed)
[name] from medical modality is calling in asking if we received a form that was sent over on Friday, asked for it to be faxed to [phone].

## 2020-04-20 DIAGNOSIS — S3091XD Unspecified superficial injury of lower back and pelvis, subsequent encounter: Secondary | ICD-10-CM | POA: Diagnosis not present

## 2020-04-20 DIAGNOSIS — E1165 Type 2 diabetes mellitus with hyperglycemia: Secondary | ICD-10-CM | POA: Diagnosis not present

## 2020-04-20 DIAGNOSIS — F0280 Dementia in other diseases classified elsewhere without behavioral disturbance: Secondary | ICD-10-CM | POA: Diagnosis not present

## 2020-04-20 DIAGNOSIS — U071 COVID-19: Secondary | ICD-10-CM | POA: Diagnosis not present

## 2020-04-20 DIAGNOSIS — G309 Alzheimer's disease, unspecified: Secondary | ICD-10-CM | POA: Diagnosis not present

## 2020-04-20 DIAGNOSIS — M6281 Muscle weakness (generalized): Secondary | ICD-10-CM | POA: Diagnosis not present

## 2020-04-20 NOTE — Telephone Encounter (Signed)
Received fax put in Dr. Ronney Lion folder to fill out.

## 2020-04-20 NOTE — Telephone Encounter (Signed)
Spoke to pt told him we did not receive the form if he could please fax over to [place] again. Henrene Pastor verbalized understanding and will re fax form. Told him okay will keep a look out for it.

## 2020-04-26 ENCOUNTER — Other Ambulatory Visit: Payer: Self-pay | Admitting: *Deleted

## 2020-04-26 ENCOUNTER — Encounter: Payer: Self-pay | Admitting: *Deleted

## 2020-04-26 ENCOUNTER — Telehealth: Payer: Self-pay

## 2020-04-26 DIAGNOSIS — F0280 Dementia in other diseases classified elsewhere without behavioral disturbance: Secondary | ICD-10-CM | POA: Diagnosis not present

## 2020-04-26 DIAGNOSIS — U071 COVID-19: Secondary | ICD-10-CM | POA: Diagnosis not present

## 2020-04-26 DIAGNOSIS — S3091XD Unspecified superficial injury of lower back and pelvis, subsequent encounter: Secondary | ICD-10-CM | POA: Diagnosis not present

## 2020-04-26 DIAGNOSIS — G309 Alzheimer's disease, unspecified: Secondary | ICD-10-CM | POA: Diagnosis not present

## 2020-04-26 DIAGNOSIS — M6281 Muscle weakness (generalized): Secondary | ICD-10-CM | POA: Diagnosis not present

## 2020-04-26 DIAGNOSIS — E1165 Type 2 diabetes mellitus with hyperglycemia: Secondary | ICD-10-CM | POA: Diagnosis not present

## 2020-04-26 NOTE — Patient Outreach (Signed)
[facility]) Care Management  04/26/2020  [name] [birth year] [id number]  [facility] outreach for EMMI-general discharge  Second RED ON [place] Day #4  Date 04/20/20 1001 Red Alert Reason Lost interest in things?  Yes Sad/hopeless/anxious/empty? Yes Other questions/problems? Yes  insurance NextGen Medicare  [facility] admissions x 3 ED visits x 3 in the last 6 months -ED x 4 admissions x 3 in last year Last admission 04/12/20 04/14/20 with [facility] and a hospital bed   Transition of care services noted to be completed by primary care MD office staff-[facility] Transition of Care will be completed by primary care provider office who will refer to [facility] care management if needed.   Outreach attempt successful to [phone] as listed in [facility] referral   Mr Adrian Specht answered and completed the EMMI assessment follow up with [facility] RN CM as as [name] has dementia  EMMI assessment  [name] confirms overall [name] is doing well  [name] confirms the EMMI responses are correct related to "Lost interest in things" and "Sad/hopeless/anxious/empty" He confirms these are related to [name] memory changes. [facility] RN CM discussed [facility] SW services to offer assistance for these concerns but [name] reports [name] is okay without the resources. SDOH Interventions     Most Recent Value  SDOH Interventions  SDOH Interventions for the Following Domains Depression  [offered [facility] SW services but denied need of [facility] SW services]  Depression Interventions/Treatment  Medication, Patient refuses Treatment  [[name] is on Celexa 10 mg daily, Aricept 10 mg daily at bedtime, Namendat 10 mg twice a day and seroquel 12.5-25 mg prn at bedtime,]      "Other questions/problems"- [name] reports [name] within the last 48 hours developed a rash on various areas of her body. Mr reports their niece and home health staff  have called the  MD to  discuss the rash already  [name] also shares he would like for [name] to regain her some of her memory and walk better. He confirms home health [facility]) Physical therapy (PT) is providing services  [facility] RN CM inquired about medical services for memory. [facility] RN CM discussed neurology services.   [facility] RN CM discussed the EMMIs sent via e-mail after the last outreach. (to include recovering from sepsis: full program, urinary tract infections in adults, Dementia" basics, daily care for type 2 diabetes an high blood pressure in adults  These have not been reviewed. [facility] RN CM encouraged review of the EMMI and offered to answer any questions. Empathy offered  [facility] RN CM sent via e-mail EMMI for skin rash today   [facility] RN CM discussed with [name] that there will be further automated EMMI- post discharge calls to assess how the patient is doing following the recent hospitalization Advised the patient that another call may be received from a nurse if any of their responses were abnormal. Patient voiced understanding and was appreciative of f/u call.  Consent: [facility] RN CM reviewed [facility] services with patient. Patient gave verbal consent for services [facility] telephonic RN CM.   Social: [name] is a 86 year old retired patient who lives at home with her husband, [name] (former CEO of [employer]) their dog and other family members They have been married for "60 five years" Her family members provide all care and transportation needs Have hired staff to assist during  the week She has not driven since 4356 She had 2 years of college [name] reports a poor appetite and an intake of Ensue supplement qid   Conditions: Sepsis secondary to  pneumonia (PNA) plus minus urinary tract infection (UTI)  Acute Metabolic Encephalopathy superimposed on advanced baseline dementia, Alzheimer's disease, Diabetes mellitus type 2 with hyperglycemia, Hypertension (HTN) Asthma, GERD (gastroesophageal reflux  disease), Hyperlipidemia (HLD), open angle left eye glaucoma,  hypernatremia, hypokalemia, constipation, stool impaction, unstagable pressure deep tissue pressure injury/Sacral wound, bilateral heel and elbow ulcers, anemia, arthritis, hx of wrist fracture with surgery never smoker allergies, venous insufficiency    YSH:UOHFGBMS bed bilateral heel and elbow protectors, wound supplies cane nebulizer, rolling walker-2 wheels, ramp entrance   Plan: [facility] RN CM will follow up with Megan & [name] within the next 14-21 business days as discussed with [name] today Pt encouraged to return a call to [facility] RN CM prn Routed note to MD   Joelene Millin L. Lavina Hamman, RN, BSN, [place] Coordinator Office number [phone] Mobile number [phone]  Main [facility] number [phone] Fax number [phone]

## 2020-04-26 NOTE — Telephone Encounter (Signed)
[facility] rep called to notify Dr. Yong Channel that pt has light rash on skin

## 2020-04-27 NOTE — Telephone Encounter (Signed)
Sent my chart to see if they need an appointment.

## 2020-04-27 NOTE — Telephone Encounter (Signed)
Does patient need visit?  Or does family want to just monitor

## 2020-04-27 NOTE — Telephone Encounter (Signed)
FYI

## 2020-04-28 DIAGNOSIS — M6281 Muscle weakness (generalized): Secondary | ICD-10-CM | POA: Diagnosis not present

## 2020-04-28 DIAGNOSIS — G309 Alzheimer's disease, unspecified: Secondary | ICD-10-CM | POA: Diagnosis not present

## 2020-04-28 DIAGNOSIS — E1165 Type 2 diabetes mellitus with hyperglycemia: Secondary | ICD-10-CM | POA: Diagnosis not present

## 2020-04-28 DIAGNOSIS — U071 COVID-19: Secondary | ICD-10-CM | POA: Diagnosis not present

## 2020-04-28 DIAGNOSIS — F0280 Dementia in other diseases classified elsewhere without behavioral disturbance: Secondary | ICD-10-CM | POA: Diagnosis not present

## 2020-04-28 DIAGNOSIS — S3091XD Unspecified superficial injury of lower back and pelvis, subsequent encounter: Secondary | ICD-10-CM | POA: Diagnosis not present

## 2020-05-02 ENCOUNTER — Other Ambulatory Visit: Payer: Self-pay

## 2020-05-02 ENCOUNTER — Telehealth: Payer: Self-pay | Admitting: Family Medicine

## 2020-05-02 ENCOUNTER — Telehealth (INDEPENDENT_AMBULATORY_CARE_PROVIDER_SITE_OTHER): Payer: Medicare Other | Admitting: Family Medicine

## 2020-05-02 ENCOUNTER — Encounter: Payer: Self-pay | Admitting: Family Medicine

## 2020-05-02 VITALS — BP 121/67 | HR 83 | Temp 97.9°F | Ht 66.0 in | Wt 117.0 lb

## 2020-05-02 DIAGNOSIS — S3091XD Unspecified superficial injury of lower back and pelvis, subsequent encounter: Secondary | ICD-10-CM | POA: Diagnosis not present

## 2020-05-02 DIAGNOSIS — F0280 Dementia in other diseases classified elsewhere without behavioral disturbance: Secondary | ICD-10-CM | POA: Diagnosis not present

## 2020-05-02 DIAGNOSIS — I2510 Atherosclerotic heart disease of native coronary artery without angina pectoris: Secondary | ICD-10-CM | POA: Diagnosis not present

## 2020-05-02 DIAGNOSIS — U071 COVID-19: Secondary | ICD-10-CM | POA: Diagnosis not present

## 2020-05-02 DIAGNOSIS — R21 Rash and other nonspecific skin eruption: Secondary | ICD-10-CM | POA: Diagnosis not present

## 2020-05-02 DIAGNOSIS — G309 Alzheimer's disease, unspecified: Secondary | ICD-10-CM | POA: Diagnosis not present

## 2020-05-02 DIAGNOSIS — E1165 Type 2 diabetes mellitus with hyperglycemia: Secondary | ICD-10-CM | POA: Diagnosis not present

## 2020-05-02 DIAGNOSIS — M6281 Muscle weakness (generalized): Secondary | ICD-10-CM | POA: Diagnosis not present

## 2020-05-02 DIAGNOSIS — F0281 Dementia in other diseases classified elsewhere with behavioral disturbance: Secondary | ICD-10-CM | POA: Diagnosis not present

## 2020-05-02 NOTE — Telephone Encounter (Signed)
[name] calls form [facility] today wanting to address some issues, states that she has a rash on her lower back that now has white heads on them, developed sore throat -[name] tried looking in but was only able to clear that there is no sores on the roof of mouth. Did say that she has been sleeping a lot, so scheduled patient virtually today with Hunter.

## 2020-05-02 NOTE — Progress Notes (Signed)
Phone [phone] Virtual visit via Video note   Subjective:  Chief complaint: Chief Complaint  Patient presents with  . Rash   This visit type was conducted due to national recommendations for restrictions regarding the COVID-19 Pandemic (e.g. social distancing).  This format is felt to be most appropriate for this patient at this time balancing risks to patient and risks to population by having him in for in person visit.  No physical exam was performed (except for noted visual exam or audio findings with Telehealth visits).    Our team/I connected with [name] at  4:20 PM EDT by a video enabled telemedicine application (doxy.me or caregility through epic) and verified that I am speaking with the correct person using two identifiers.  Location patient: Home[place] Location provider: [facility], office Persons participating in the virtual visit:  patient, husband, daughter  Our team/I discussed the limitations of evaluation and management by telemedicine and the availability of in person appointments. In light of current covid-19 pandemic, patient also understands that we are trying to protect them by minimizing in office contact if at all possible.  The patient expressed consent for telemedicine visit and agreed to proceed. Patient understands insurance will be billed.   Past Medical History-  Patient Active Problem List   Diagnosis Date Noted  . COVID-19 virus infection 10/23/2019    Priority: High  . History of DVT in adulthood 06/26/2017    Priority: High  . Coronary artery calcification seen on CAT scan 08/28/2016    Priority: High  . Alzheimer's disease ([place]) 01/04/2016    Priority: High  . Diabetes ([place]) 12/18/2006    Priority: High  . Atherosclerosis of native arteries of extremity with intermittent claudication ([place]) 12/18/2006    Priority: High  . Hyperlipidemia associated with type 2 diabetes mellitus ([place]) 10/30/2019    Priority: Medium  . Osteoporosis 07/02/2018      Priority: Medium  . Generalized weakness 05/02/2018    Priority: Medium  . Pancreas cyst 07/02/2015    Priority: Medium  . Asthma, chronic 03/16/2015    Priority: Medium  . Primary open angle glaucoma of left eye, moderate stage 12/25/2014    Priority: Medium  . Hypertension associated with diabetes ([place]) 12/18/2006    Priority: Medium  . Aortic atherosclerosis ([place]) 10/30/2019    Priority: Low  . Bradycardia with 51-60 beats per minute 10/24/2019    Priority: Low  . Protein-calorie malnutrition ([place]) 07/21/2018    Priority: Low  . GERD (gastroesophageal reflux disease) 05/02/2018    Priority: Low  . LBBB (left bundle branch block) 10/09/2016    Priority: Low  . Pleural effusion, left 09/23/2016    Priority: Low  . Pseudophakia of both eyes 02/15/2013    Priority: Low  . Anemia 05/25/2012    Priority: Low  . Astigmatism 04/29/2012    Priority: Low  . Allergic rhinitis 10/30/2010    Priority: Low  . Venous (peripheral) insufficiency 04/13/2007    Priority: Low  . OSTEOARTHRITIS OF SPINE, NOS 12/18/2006    Priority: Low  . Unstageable pressure ulcer of sacral region ([place]) 04/13/2020  . Pneumonia 04/13/2020  . Acute metabolic encephalopathy 47/42/5956  . Sepsis ([place]) 04/12/2020  . Hypernatremia 04/12/2020  . Encephalopathy 11/04/2019  . Pressure injury of skin 11/04/2019  . Hyperglycemia 11/03/2019  . Diabetic ulcer of foot with fat layer exposed ([place]) 05/14/2018    Medications- reviewed and updated Current Outpatient Medications  Medication Sig Dispense Refill  . acetaminophen (TYLENOL) 500  MG tablet Take 500 mg by mouth every 8 (eight) hours as needed.    Marland Kitchen albuterol (PROAIR HFA) 108 (90 Base) MCG/ACT inhaler 2 PUFFS EVERY 6 HOURS AS NEEDED FOR SHORTNESS OF BREATH. (Patient taking differently: Inhale 2 puffs into the lungs every 6 (six) hours as needed for wheezing or shortness of breath. ) 17 g 5  . albuterol (PROVENTIL) (2.5 MG/3ML) 0.083% nebulizer solution  USE THREE MILLILITERS VIA NEBULIZATION BY MOUTH EVERY 6 HOURS AS NEEDED FOR WHEEZING OR SHORTNESS OF BREATH (Patient taking differently: Take 2.5 mg by nebulization every 6 (six) hours as needed for wheezing or shortness of breath. ) 75 mL 1  . amLODipine (NORVASC) 10 MG tablet Take 1 tablet (10 mg total) by mouth daily. 90 tablet 1  . ascorbic acid (VITAMIN C) 500 MG tablet Take 500 mg by mouth 2 (two) times daily.    Marland Kitchen aspirin 81 MG tablet Take 81 mg by mouth daily.    . brimonidine (ALPHAGAN P) 0.1 % SOLN Place 1 drop into both eyes 2 (two) times daily.     . budesonide (PULMICORT) 0.25 MG/2ML nebulizer solution TAKE 2 MILLILITERS VIA NEBULIZATION DAILY AS DIRECTED (Patient taking differently: Take 0.25 mg by nebulization every 4 (four) hours as needed (wheezing). ) 60 mL 11  . calcium carbonate (OS-CAL) 600 MG TABS Take 600 mg by mouth 2 (two) times daily with a meal.    . citalopram (CELEXA) 10 MG tablet TAKE 1 TABLET ONCE DAILY. (Patient taking differently: Take 10 mg by mouth daily. ) 90 tablet 3  . collagenase (SANTYL) ointment Apply 1 application topically daily. Apply to affected area 90 g 0  . donepezil (ARICEPT) 10 MG tablet TAKE ONE TABLET AT BEDTIME. (Patient taking differently: Take 10 mg by mouth at bedtime. ) 90 tablet 3  . famotidine (PEPCID) 20 MG tablet TAKE ONE TABLET TWICE A DAY AS NEEDED FOR HEARTBURN OR INDIGESTION. (Patient taking differently: Take 20 mg by mouth 2 (two) times daily as needed for heartburn or indigestion. ) 120 tablet 3  . ferrous sulfate 325 (65 FE) MG EC tablet Take 325 mg by mouth daily with breakfast.     . K Phos Mono-Sod Phos Di & Mono (PHOSPHA 250 NEUTRAL) [id number] MG TABS TAKE 1 TABLET BY MOUTH DAILY. (Patient taking differently: Take 1 tablet by mouth daily. ) 30 tablet 0  . memantine (NAMENDA) 10 MG tablet TAKE 1 TABLET TWICE DAILY FOR MEMORY. (Patient taking differently: Take 10 mg by mouth 2 (two) times daily. ) 180 tablet 3  . metFORMIN  (GLUCOPHAGE) 500 MG tablet Take 1 tablet (500 mg total) by mouth 2 (two) times daily with a meal. 180 tablet 1  . Multiple Vitamin (MULTIVITAMIN WITH MINERALS) TABS tablet Take 1 tablet by mouth daily.    . QUEtiapine (SEROQUEL) 25 MG tablet Take 0.5-1 tablets (12.5-25 mg total) by mouth at bedtime. As needed for sleep and agitation if not able to sleep for 24-48 hours. 15 tablet 1  . senna-docusate (SENOKOT-S) 8.6-50 MG tablet Take 1 tablet by mouth 2 (two) times daily between meals as needed for mild constipation. 60 tablet 0  . traMADol (ULTRAM) 50 MG tablet Take 25 mg by mouth every 8 (eight) hours as needed for moderate pain.      No current facility-administered medications for this visit.     Objective:  BP 121/67 Comment: yesterday by home health.  Pulse 83 Comment: yesterday by home health.  Temp 97.9 F (36.6  C) Comment: yesterday by home health.  Ht 5\' 6"  (1.676 m)   Wt 117 lb (53.1 kg) Comment: at last visit  LMP  (LMP Unknown)   SpO2 92% Comment: yesterday by home health.  BMI 18.88 kg/m  self reported vitals Gen: Resting comfortably in bed.  Does not wake up during our visit     Assessment and Plan   #Alzheimer's disease S: Patient with advanced dementia.  Apparently when [facility] came out to recertify patient after 6 weeks they noted a progressive deterioration.  Family states memory has also substantially worsened.  For example-Doesn't remember parents have passed A/P: With deterioration of memory and physical conditioning as well as 2 hospitalizations this year family is asking if we should consider transition to hospice.  I think this is very reasonable to pursue-at least getting hospice opinion but I do think she would qualify based on decline.   Rash on lower back and buttocks S:Started last week. Has gotten worse. Looks like rash aroudn her waistline- seems to go around band of adult diaper and goes down onto the cheeks (some red bumps and blotches  of red- not very raised). Thinks may be heat rash from adult diaper. It has moved to all over her bottom.   A/P: Sounds like potential heat rash/double diaper dermatitis.  Recommended thick cream like Boudreau's Butt paste.When changing family would be able to fan her and possibly give her time to air dry.  Depending on results may need to add antifungal  [facility] came out to re certify- states had worsening of condition/deterioration in the last 6 weeks.   Recommended follow up: As needed for acute concern.  If transitions to hospice we discussed I am certainly happy to be involved in her care still but they will have a lot of support from there services and may need less regular follow-up with me Future Appointments  Date Time Provider [place]  05/17/2020 11:00 AM Barbaraann Faster, RN [facility] None   Lab/Order associations:   ICD-10-CM   1. Rash  R21   2. Alzheimer's dementia with behavioral disturbance, unspecified timing of dementia onset ([place])  G30.9    F02.81    Time Spent: 21 minutes of total time (4:42 PM-5:03 PM) was spent on the date of the encounter performing the following actions: chart review prior to seeing the patient, obtaining history, performing a medically necessary exam, counseling on the treatment plan, placing orders, and documenting in our EHR.   Return precautions advised.  Garret Reddish, MD

## 2020-05-02 NOTE — Telephone Encounter (Signed)
Pt scheduled for virtual today.

## 2020-05-03 DIAGNOSIS — F0280 Dementia in other diseases classified elsewhere without behavioral disturbance: Secondary | ICD-10-CM | POA: Diagnosis not present

## 2020-05-03 DIAGNOSIS — G309 Alzheimer's disease, unspecified: Secondary | ICD-10-CM | POA: Diagnosis not present

## 2020-05-03 DIAGNOSIS — U071 COVID-19: Secondary | ICD-10-CM | POA: Diagnosis not present

## 2020-05-03 DIAGNOSIS — E1165 Type 2 diabetes mellitus with hyperglycemia: Secondary | ICD-10-CM | POA: Diagnosis not present

## 2020-05-03 DIAGNOSIS — M6281 Muscle weakness (generalized): Secondary | ICD-10-CM | POA: Diagnosis not present

## 2020-05-03 DIAGNOSIS — S3091XD Unspecified superficial injury of lower back and pelvis, subsequent encounter: Secondary | ICD-10-CM | POA: Diagnosis not present

## 2020-05-06 DIAGNOSIS — E46 Unspecified protein-calorie malnutrition: Secondary | ICD-10-CM | POA: Diagnosis not present

## 2020-05-06 DIAGNOSIS — E1165 Type 2 diabetes mellitus with hyperglycemia: Secondary | ICD-10-CM | POA: Diagnosis not present

## 2020-05-06 DIAGNOSIS — G309 Alzheimer's disease, unspecified: Secondary | ICD-10-CM | POA: Diagnosis not present

## 2020-05-06 DIAGNOSIS — Z86718 Personal history of other venous thrombosis and embolism: Secondary | ICD-10-CM | POA: Diagnosis not present

## 2020-05-06 DIAGNOSIS — F0280 Dementia in other diseases classified elsewhere without behavioral disturbance: Secondary | ICD-10-CM | POA: Diagnosis not present

## 2020-05-06 DIAGNOSIS — Z7984 Long term (current) use of oral hypoglycemic drugs: Secondary | ICD-10-CM | POA: Diagnosis not present

## 2020-05-06 DIAGNOSIS — J45909 Unspecified asthma, uncomplicated: Secondary | ICD-10-CM | POA: Diagnosis not present

## 2020-05-06 DIAGNOSIS — L89152 Pressure ulcer of sacral region, stage 2: Secondary | ICD-10-CM | POA: Diagnosis not present

## 2020-05-06 DIAGNOSIS — I739 Peripheral vascular disease, unspecified: Secondary | ICD-10-CM | POA: Diagnosis not present

## 2020-05-06 DIAGNOSIS — I2510 Atherosclerotic heart disease of native coronary artery without angina pectoris: Secondary | ICD-10-CM | POA: Diagnosis not present

## 2020-05-06 DIAGNOSIS — M810 Age-related osteoporosis without current pathological fracture: Secondary | ICD-10-CM | POA: Diagnosis not present

## 2020-05-06 DIAGNOSIS — E1151 Type 2 diabetes mellitus with diabetic peripheral angiopathy without gangrene: Secondary | ICD-10-CM | POA: Diagnosis not present

## 2020-05-06 DIAGNOSIS — M6281 Muscle weakness (generalized): Secondary | ICD-10-CM | POA: Diagnosis not present

## 2020-05-06 DIAGNOSIS — I10 Essential (primary) hypertension: Secondary | ICD-10-CM | POA: Diagnosis not present

## 2020-05-09 ENCOUNTER — Ambulatory Visit: Payer: Self-pay | Admitting: *Deleted

## 2020-05-10 DIAGNOSIS — E46 Unspecified protein-calorie malnutrition: Secondary | ICD-10-CM | POA: Diagnosis not present

## 2020-05-10 DIAGNOSIS — M6281 Muscle weakness (generalized): Secondary | ICD-10-CM | POA: Diagnosis not present

## 2020-05-10 DIAGNOSIS — L89152 Pressure ulcer of sacral region, stage 2: Secondary | ICD-10-CM | POA: Diagnosis not present

## 2020-05-10 DIAGNOSIS — G309 Alzheimer's disease, unspecified: Secondary | ICD-10-CM | POA: Diagnosis not present

## 2020-05-10 DIAGNOSIS — E1165 Type 2 diabetes mellitus with hyperglycemia: Secondary | ICD-10-CM | POA: Diagnosis not present

## 2020-05-10 DIAGNOSIS — F0280 Dementia in other diseases classified elsewhere without behavioral disturbance: Secondary | ICD-10-CM | POA: Diagnosis not present

## 2020-05-12 ENCOUNTER — Telehealth: Payer: Self-pay | Admitting: Family Medicine

## 2020-05-12 DIAGNOSIS — E1165 Type 2 diabetes mellitus with hyperglycemia: Secondary | ICD-10-CM | POA: Diagnosis not present

## 2020-05-12 DIAGNOSIS — F0280 Dementia in other diseases classified elsewhere without behavioral disturbance: Secondary | ICD-10-CM | POA: Diagnosis not present

## 2020-05-12 DIAGNOSIS — G309 Alzheimer's disease, unspecified: Secondary | ICD-10-CM | POA: Diagnosis not present

## 2020-05-12 DIAGNOSIS — E46 Unspecified protein-calorie malnutrition: Secondary | ICD-10-CM | POA: Diagnosis not present

## 2020-05-12 DIAGNOSIS — L89152 Pressure ulcer of sacral region, stage 2: Secondary | ICD-10-CM | POA: Diagnosis not present

## 2020-05-12 DIAGNOSIS — M6281 Muscle weakness (generalized): Secondary | ICD-10-CM | POA: Diagnosis not present

## 2020-05-12 NOTE — Telephone Encounter (Signed)
FYI

## 2020-05-12 NOTE — Telephone Encounter (Signed)
Erica from [facility] called stating pt has a new open wound on her right heel. Wound is currently covered with a foam pad. Case Manager will be out to evaluate pt within the next week.

## 2020-05-15 NOTE — Telephone Encounter (Signed)
Agree with home evaluation-can refer to wound care if needed

## 2020-05-17 ENCOUNTER — Ambulatory Visit: Payer: Self-pay | Admitting: *Deleted

## 2020-05-17 ENCOUNTER — Telehealth: Payer: Self-pay | Admitting: Family Medicine

## 2020-05-17 DIAGNOSIS — L89152 Pressure ulcer of sacral region, stage 2: Secondary | ICD-10-CM | POA: Diagnosis not present

## 2020-05-17 DIAGNOSIS — F0280 Dementia in other diseases classified elsewhere without behavioral disturbance: Secondary | ICD-10-CM | POA: Diagnosis not present

## 2020-05-17 DIAGNOSIS — M6281 Muscle weakness (generalized): Secondary | ICD-10-CM | POA: Diagnosis not present

## 2020-05-17 DIAGNOSIS — E46 Unspecified protein-calorie malnutrition: Secondary | ICD-10-CM | POA: Diagnosis not present

## 2020-05-17 DIAGNOSIS — G309 Alzheimer's disease, unspecified: Secondary | ICD-10-CM | POA: Diagnosis not present

## 2020-05-17 DIAGNOSIS — E1165 Type 2 diabetes mellitus with hyperglycemia: Secondary | ICD-10-CM | POA: Diagnosis not present

## 2020-05-17 NOTE — Telephone Encounter (Signed)
[name] from [facility] made a visit and has talked with the husband; [name] is barely eating -a 1/3 of grits, and maybe a few bites of jello a day, has decided to move ahead with Hospice care.

## 2020-05-17 NOTE — Telephone Encounter (Signed)
That sounds very appropriate-please check in with family and see if they need anything from us-I believe the referral is already in place for hospice but I am happy to sign anything that is needed-please double check and make sure referral is in the system

## 2020-05-17 NOTE — Telephone Encounter (Signed)
Called to f/u with family and lm for pt them to cb if anything is needed from [place].

## 2020-05-17 NOTE — Telephone Encounter (Signed)
FYI

## 2020-05-19 ENCOUNTER — Other Ambulatory Visit: Payer: Self-pay | Admitting: Family Medicine

## 2020-05-23 DIAGNOSIS — G309 Alzheimer's disease, unspecified: Secondary | ICD-10-CM | POA: Diagnosis not present

## 2020-05-23 DIAGNOSIS — F0280 Dementia in other diseases classified elsewhere without behavioral disturbance: Secondary | ICD-10-CM | POA: Diagnosis not present

## 2020-05-23 DIAGNOSIS — L89152 Pressure ulcer of sacral region, stage 2: Secondary | ICD-10-CM | POA: Diagnosis not present

## 2020-05-23 DIAGNOSIS — E46 Unspecified protein-calorie malnutrition: Secondary | ICD-10-CM | POA: Diagnosis not present

## 2020-05-23 DIAGNOSIS — M6281 Muscle weakness (generalized): Secondary | ICD-10-CM | POA: Diagnosis not present

## 2020-05-23 DIAGNOSIS — E1165 Type 2 diabetes mellitus with hyperglycemia: Secondary | ICD-10-CM | POA: Diagnosis not present

## 2020-05-23 NOTE — Telephone Encounter (Signed)
Have you received any orders or do I need to start referral?

## 2020-05-23 NOTE — Telephone Encounter (Signed)
Start referral please-my apologies I thought it was already in place

## 2020-05-23 NOTE — Telephone Encounter (Signed)
Lattie Haw from [facility] is calling in to follow up as the family has not heard anything about hospice care, did not see referral in system.

## 2020-05-24 ENCOUNTER — Other Ambulatory Visit: Payer: Self-pay

## 2020-05-24 ENCOUNTER — Telehealth: Payer: Self-pay | Admitting: Family Medicine

## 2020-05-24 DIAGNOSIS — F0280 Dementia in other diseases classified elsewhere without behavioral disturbance: Secondary | ICD-10-CM | POA: Diagnosis not present

## 2020-05-24 DIAGNOSIS — G309 Alzheimer's disease, unspecified: Secondary | ICD-10-CM | POA: Diagnosis not present

## 2020-05-24 DIAGNOSIS — E46 Unspecified protein-calorie malnutrition: Secondary | ICD-10-CM | POA: Diagnosis not present

## 2020-05-24 DIAGNOSIS — E1165 Type 2 diabetes mellitus with hyperglycemia: Secondary | ICD-10-CM | POA: Diagnosis not present

## 2020-05-24 DIAGNOSIS — H409 Unspecified glaucoma: Secondary | ICD-10-CM | POA: Diagnosis not present

## 2020-05-24 DIAGNOSIS — Z741 Need for assistance with personal care: Secondary | ICD-10-CM | POA: Diagnosis not present

## 2020-05-24 DIAGNOSIS — R159 Full incontinence of feces: Secondary | ICD-10-CM | POA: Diagnosis not present

## 2020-05-24 DIAGNOSIS — Z8616 Personal history of COVID-19: Secondary | ICD-10-CM | POA: Diagnosis not present

## 2020-05-24 DIAGNOSIS — K219 Gastro-esophageal reflux disease without esophagitis: Secondary | ICD-10-CM | POA: Diagnosis not present

## 2020-05-24 DIAGNOSIS — J449 Chronic obstructive pulmonary disease, unspecified: Secondary | ICD-10-CM | POA: Diagnosis not present

## 2020-05-24 DIAGNOSIS — M6281 Muscle weakness (generalized): Secondary | ICD-10-CM | POA: Diagnosis not present

## 2020-05-24 DIAGNOSIS — M533 Sacrococcygeal disorders, not elsewhere classified: Secondary | ICD-10-CM

## 2020-05-24 DIAGNOSIS — L89152 Pressure ulcer of sacral region, stage 2: Secondary | ICD-10-CM | POA: Diagnosis not present

## 2020-05-24 DIAGNOSIS — I10 Essential (primary) hypertension: Secondary | ICD-10-CM | POA: Diagnosis not present

## 2020-05-24 DIAGNOSIS — Z681 Body mass index (BMI) 19 or less, adult: Secondary | ICD-10-CM | POA: Diagnosis not present

## 2020-05-24 DIAGNOSIS — E118 Type 2 diabetes mellitus with unspecified complications: Secondary | ICD-10-CM | POA: Diagnosis not present

## 2020-05-24 DIAGNOSIS — F329 Major depressive disorder, single episode, unspecified: Secondary | ICD-10-CM | POA: Diagnosis not present

## 2020-05-24 DIAGNOSIS — R32 Unspecified urinary incontinence: Secondary | ICD-10-CM | POA: Diagnosis not present

## 2020-05-24 NOTE — Telephone Encounter (Signed)
Spoke to husband gave information.

## 2020-05-24 NOTE — Telephone Encounter (Signed)
Called and gave ok  

## 2020-05-24 NOTE — Telephone Encounter (Signed)
Referral started l/m to call office to give information

## 2020-05-24 NOTE — Telephone Encounter (Signed)
Crystal from [facility] is requesting dr.hunter to be hospice attending provider for patient. [phone]

## 2020-05-25 DIAGNOSIS — Z8616 Personal history of COVID-19: Secondary | ICD-10-CM | POA: Diagnosis not present

## 2020-05-25 DIAGNOSIS — L89152 Pressure ulcer of sacral region, stage 2: Secondary | ICD-10-CM | POA: Diagnosis not present

## 2020-05-25 DIAGNOSIS — F329 Major depressive disorder, single episode, unspecified: Secondary | ICD-10-CM | POA: Diagnosis not present

## 2020-05-25 DIAGNOSIS — E118 Type 2 diabetes mellitus with unspecified complications: Secondary | ICD-10-CM | POA: Diagnosis not present

## 2020-05-25 DIAGNOSIS — F0280 Dementia in other diseases classified elsewhere without behavioral disturbance: Secondary | ICD-10-CM | POA: Diagnosis not present

## 2020-05-25 DIAGNOSIS — G309 Alzheimer's disease, unspecified: Secondary | ICD-10-CM | POA: Diagnosis not present

## 2020-05-26 ENCOUNTER — Other Ambulatory Visit: Payer: Self-pay | Admitting: Family Medicine

## 2020-05-26 DIAGNOSIS — G309 Alzheimer's disease, unspecified: Secondary | ICD-10-CM | POA: Diagnosis not present

## 2020-05-26 DIAGNOSIS — L89152 Pressure ulcer of sacral region, stage 2: Secondary | ICD-10-CM | POA: Diagnosis not present

## 2020-05-26 DIAGNOSIS — Z8616 Personal history of COVID-19: Secondary | ICD-10-CM | POA: Diagnosis not present

## 2020-05-26 DIAGNOSIS — F329 Major depressive disorder, single episode, unspecified: Secondary | ICD-10-CM | POA: Diagnosis not present

## 2020-05-26 DIAGNOSIS — E118 Type 2 diabetes mellitus with unspecified complications: Secondary | ICD-10-CM | POA: Diagnosis not present

## 2020-05-26 DIAGNOSIS — F0280 Dementia in other diseases classified elsewhere without behavioral disturbance: Secondary | ICD-10-CM | POA: Diagnosis not present

## 2020-05-31 ENCOUNTER — Telehealth: Payer: Self-pay | Admitting: Family Medicine

## 2020-05-31 NOTE — Telephone Encounter (Signed)
Patients husband is returning phone call

## 2020-06-07 ENCOUNTER — Other Ambulatory Visit: Payer: Self-pay | Admitting: Family Medicine

## 2020-06-07 DIAGNOSIS — L89152 Pressure ulcer of sacral region, stage 2: Secondary | ICD-10-CM | POA: Diagnosis not present

## 2020-06-07 DIAGNOSIS — Z8616 Personal history of COVID-19: Secondary | ICD-10-CM | POA: Diagnosis not present

## 2020-06-07 DIAGNOSIS — E118 Type 2 diabetes mellitus with unspecified complications: Secondary | ICD-10-CM | POA: Diagnosis not present

## 2020-06-07 DIAGNOSIS — F329 Major depressive disorder, single episode, unspecified: Secondary | ICD-10-CM | POA: Diagnosis not present

## 2020-06-07 DIAGNOSIS — F0280 Dementia in other diseases classified elsewhere without behavioral disturbance: Secondary | ICD-10-CM | POA: Diagnosis not present

## 2020-06-07 DIAGNOSIS — G309 Alzheimer's disease, unspecified: Secondary | ICD-10-CM | POA: Diagnosis not present

## 2020-06-08 ENCOUNTER — Telehealth: Payer: Self-pay | Admitting: Family Medicine

## 2020-06-08 NOTE — Telephone Encounter (Signed)
Mura from [facility] is calling in with a few questions for Dr.Hunter.   [place] sent standing orders sent over earlier this month and has not heard anything -fax #: [phone]   2.On standing orders there is a medication, hydroxyzine for itching, patient has been complaining about being itchy.    3.They also wanted to know if they are able to use wound care for 2 wounds on tailbone and right heel   4. Patient's husband is wondering if she needs to receive her covid vaccine, nurse did state that she is pretty much home bound.

## 2020-06-09 NOTE — Telephone Encounter (Signed)
Mura called back requesting orders for patient. Patient is uncomfortable and itchy.

## 2020-06-09 NOTE — Telephone Encounter (Signed)
Have you seen orders?

## 2020-06-12 DIAGNOSIS — E118 Type 2 diabetes mellitus with unspecified complications: Secondary | ICD-10-CM | POA: Diagnosis not present

## 2020-06-12 DIAGNOSIS — L89152 Pressure ulcer of sacral region, stage 2: Secondary | ICD-10-CM | POA: Diagnosis not present

## 2020-06-12 DIAGNOSIS — F329 Major depressive disorder, single episode, unspecified: Secondary | ICD-10-CM | POA: Diagnosis not present

## 2020-06-12 DIAGNOSIS — Z8616 Personal history of COVID-19: Secondary | ICD-10-CM | POA: Diagnosis not present

## 2020-06-12 DIAGNOSIS — G309 Alzheimer's disease, unspecified: Secondary | ICD-10-CM | POA: Diagnosis not present

## 2020-06-12 DIAGNOSIS — F0280 Dementia in other diseases classified elsewhere without behavioral disturbance: Secondary | ICD-10-CM | POA: Diagnosis not present

## 2020-06-12 NOTE — Telephone Encounter (Signed)
I feel like I have seen orders for her and they were signed and faxed.  I am happy to sign again if needed.  I think hydroxyzine will be reasonable.  May make her sleepier though.  I am okay with wound care.  With her being on hospice I am okay on holding off on COVID-19 vaccination particularly with her being homebound

## 2020-06-12 NOTE — Telephone Encounter (Signed)
Called and l/m on Mura's v/m with information. I have d/c covid in [facility]. Hold to see if Jobie Quaker gets message. Follow up at later time.

## 2020-06-13 DIAGNOSIS — G309 Alzheimer's disease, unspecified: Secondary | ICD-10-CM | POA: Diagnosis not present

## 2020-06-13 DIAGNOSIS — E118 Type 2 diabetes mellitus with unspecified complications: Secondary | ICD-10-CM | POA: Diagnosis not present

## 2020-06-13 DIAGNOSIS — F0280 Dementia in other diseases classified elsewhere without behavioral disturbance: Secondary | ICD-10-CM | POA: Diagnosis not present

## 2020-06-13 DIAGNOSIS — F329 Major depressive disorder, single episode, unspecified: Secondary | ICD-10-CM | POA: Diagnosis not present

## 2020-06-13 DIAGNOSIS — L89152 Pressure ulcer of sacral region, stage 2: Secondary | ICD-10-CM | POA: Diagnosis not present

## 2020-06-13 DIAGNOSIS — Z8616 Personal history of COVID-19: Secondary | ICD-10-CM | POA: Diagnosis not present

## 2020-06-14 DIAGNOSIS — G309 Alzheimer's disease, unspecified: Secondary | ICD-10-CM | POA: Diagnosis not present

## 2020-06-14 DIAGNOSIS — F0280 Dementia in other diseases classified elsewhere without behavioral disturbance: Secondary | ICD-10-CM | POA: Diagnosis not present

## 2020-06-14 DIAGNOSIS — Z8616 Personal history of COVID-19: Secondary | ICD-10-CM | POA: Diagnosis not present

## 2020-06-14 DIAGNOSIS — F329 Major depressive disorder, single episode, unspecified: Secondary | ICD-10-CM | POA: Diagnosis not present

## 2020-06-14 DIAGNOSIS — E118 Type 2 diabetes mellitus with unspecified complications: Secondary | ICD-10-CM | POA: Diagnosis not present

## 2020-06-14 DIAGNOSIS — L89152 Pressure ulcer of sacral region, stage 2: Secondary | ICD-10-CM | POA: Diagnosis not present

## 2020-06-16 DIAGNOSIS — E118 Type 2 diabetes mellitus with unspecified complications: Secondary | ICD-10-CM | POA: Diagnosis not present

## 2020-06-16 DIAGNOSIS — Z8616 Personal history of COVID-19: Secondary | ICD-10-CM | POA: Diagnosis not present

## 2020-06-16 DIAGNOSIS — F329 Major depressive disorder, single episode, unspecified: Secondary | ICD-10-CM | POA: Diagnosis not present

## 2020-06-16 DIAGNOSIS — G309 Alzheimer's disease, unspecified: Secondary | ICD-10-CM | POA: Diagnosis not present

## 2020-06-16 DIAGNOSIS — F0280 Dementia in other diseases classified elsewhere without behavioral disturbance: Secondary | ICD-10-CM | POA: Diagnosis not present

## 2020-06-16 DIAGNOSIS — L89152 Pressure ulcer of sacral region, stage 2: Secondary | ICD-10-CM | POA: Diagnosis not present

## 2020-06-20 DIAGNOSIS — F0280 Dementia in other diseases classified elsewhere without behavioral disturbance: Secondary | ICD-10-CM | POA: Diagnosis not present

## 2020-06-20 DIAGNOSIS — L89152 Pressure ulcer of sacral region, stage 2: Secondary | ICD-10-CM | POA: Diagnosis not present

## 2020-06-20 DIAGNOSIS — Z8616 Personal history of COVID-19: Secondary | ICD-10-CM | POA: Diagnosis not present

## 2020-06-20 DIAGNOSIS — F329 Major depressive disorder, single episode, unspecified: Secondary | ICD-10-CM | POA: Diagnosis not present

## 2020-06-20 DIAGNOSIS — E118 Type 2 diabetes mellitus with unspecified complications: Secondary | ICD-10-CM | POA: Diagnosis not present

## 2020-06-20 DIAGNOSIS — G309 Alzheimer's disease, unspecified: Secondary | ICD-10-CM | POA: Diagnosis not present

## 2020-06-21 DIAGNOSIS — J449 Chronic obstructive pulmonary disease, unspecified: Secondary | ICD-10-CM | POA: Diagnosis not present

## 2020-06-21 DIAGNOSIS — I10 Essential (primary) hypertension: Secondary | ICD-10-CM | POA: Diagnosis not present

## 2020-06-21 DIAGNOSIS — F329 Major depressive disorder, single episode, unspecified: Secondary | ICD-10-CM | POA: Diagnosis not present

## 2020-06-21 DIAGNOSIS — R159 Full incontinence of feces: Secondary | ICD-10-CM | POA: Diagnosis not present

## 2020-06-21 DIAGNOSIS — F0280 Dementia in other diseases classified elsewhere without behavioral disturbance: Secondary | ICD-10-CM | POA: Diagnosis not present

## 2020-06-21 DIAGNOSIS — H409 Unspecified glaucoma: Secondary | ICD-10-CM | POA: Diagnosis not present

## 2020-06-21 DIAGNOSIS — L89152 Pressure ulcer of sacral region, stage 2: Secondary | ICD-10-CM | POA: Diagnosis not present

## 2020-06-21 DIAGNOSIS — E118 Type 2 diabetes mellitus with unspecified complications: Secondary | ICD-10-CM | POA: Diagnosis not present

## 2020-06-21 DIAGNOSIS — Z8616 Personal history of COVID-19: Secondary | ICD-10-CM | POA: Diagnosis not present

## 2020-06-21 DIAGNOSIS — Z741 Need for assistance with personal care: Secondary | ICD-10-CM | POA: Diagnosis not present

## 2020-06-21 DIAGNOSIS — Z681 Body mass index (BMI) 19 or less, adult: Secondary | ICD-10-CM | POA: Diagnosis not present

## 2020-06-21 DIAGNOSIS — G309 Alzheimer's disease, unspecified: Secondary | ICD-10-CM | POA: Diagnosis not present

## 2020-06-21 DIAGNOSIS — R32 Unspecified urinary incontinence: Secondary | ICD-10-CM | POA: Diagnosis not present

## 2020-06-21 DIAGNOSIS — K219 Gastro-esophageal reflux disease without esophagitis: Secondary | ICD-10-CM | POA: Diagnosis not present

## 2020-06-22 DIAGNOSIS — Z8616 Personal history of COVID-19: Secondary | ICD-10-CM | POA: Diagnosis not present

## 2020-06-22 DIAGNOSIS — G309 Alzheimer's disease, unspecified: Secondary | ICD-10-CM | POA: Diagnosis not present

## 2020-06-22 DIAGNOSIS — F0280 Dementia in other diseases classified elsewhere without behavioral disturbance: Secondary | ICD-10-CM | POA: Diagnosis not present

## 2020-06-22 DIAGNOSIS — F329 Major depressive disorder, single episode, unspecified: Secondary | ICD-10-CM | POA: Diagnosis not present

## 2020-06-22 DIAGNOSIS — L89152 Pressure ulcer of sacral region, stage 2: Secondary | ICD-10-CM | POA: Diagnosis not present

## 2020-06-22 DIAGNOSIS — E118 Type 2 diabetes mellitus with unspecified complications: Secondary | ICD-10-CM | POA: Diagnosis not present

## 2020-06-23 DIAGNOSIS — E118 Type 2 diabetes mellitus with unspecified complications: Secondary | ICD-10-CM | POA: Diagnosis not present

## 2020-06-23 DIAGNOSIS — G309 Alzheimer's disease, unspecified: Secondary | ICD-10-CM | POA: Diagnosis not present

## 2020-06-23 DIAGNOSIS — Z8616 Personal history of COVID-19: Secondary | ICD-10-CM | POA: Diagnosis not present

## 2020-06-23 DIAGNOSIS — L89152 Pressure ulcer of sacral region, stage 2: Secondary | ICD-10-CM | POA: Diagnosis not present

## 2020-06-23 DIAGNOSIS — F0280 Dementia in other diseases classified elsewhere without behavioral disturbance: Secondary | ICD-10-CM | POA: Diagnosis not present

## 2020-06-23 DIAGNOSIS — F329 Major depressive disorder, single episode, unspecified: Secondary | ICD-10-CM | POA: Diagnosis not present

## 2020-06-27 ENCOUNTER — Other Ambulatory Visit: Payer: Self-pay | Admitting: *Deleted

## 2020-06-27 DIAGNOSIS — G309 Alzheimer's disease, unspecified: Secondary | ICD-10-CM | POA: Diagnosis not present

## 2020-06-27 DIAGNOSIS — E118 Type 2 diabetes mellitus with unspecified complications: Secondary | ICD-10-CM | POA: Diagnosis not present

## 2020-06-27 DIAGNOSIS — F329 Major depressive disorder, single episode, unspecified: Secondary | ICD-10-CM | POA: Diagnosis not present

## 2020-06-27 DIAGNOSIS — Z8616 Personal history of COVID-19: Secondary | ICD-10-CM | POA: Diagnosis not present

## 2020-06-27 DIAGNOSIS — F0280 Dementia in other diseases classified elsewhere without behavioral disturbance: Secondary | ICD-10-CM | POA: Diagnosis not present

## 2020-06-27 DIAGNOSIS — L89152 Pressure ulcer of sacral region, stage 2: Secondary | ICD-10-CM | POA: Diagnosis not present

## 2020-06-27 NOTE — Patient Outreach (Signed)
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 From reading the chart, nurse discovered that Hospice has been referred to care for the patient.   Plan: RN Health Coach will close case.  Emelia Loron RN, [place] [phone] [name]

## 2020-06-30 DIAGNOSIS — F329 Major depressive disorder, single episode, unspecified: Secondary | ICD-10-CM | POA: Diagnosis not present

## 2020-06-30 DIAGNOSIS — F0280 Dementia in other diseases classified elsewhere without behavioral disturbance: Secondary | ICD-10-CM | POA: Diagnosis not present

## 2020-06-30 DIAGNOSIS — G309 Alzheimer's disease, unspecified: Secondary | ICD-10-CM | POA: Diagnosis not present

## 2020-06-30 DIAGNOSIS — Z8616 Personal history of COVID-19: Secondary | ICD-10-CM | POA: Diagnosis not present

## 2020-06-30 DIAGNOSIS — E118 Type 2 diabetes mellitus with unspecified complications: Secondary | ICD-10-CM | POA: Diagnosis not present

## 2020-06-30 DIAGNOSIS — L89152 Pressure ulcer of sacral region, stage 2: Secondary | ICD-10-CM | POA: Diagnosis not present

## 2020-07-04 DIAGNOSIS — F0280 Dementia in other diseases classified elsewhere without behavioral disturbance: Secondary | ICD-10-CM | POA: Diagnosis not present

## 2020-07-04 DIAGNOSIS — Z8616 Personal history of COVID-19: Secondary | ICD-10-CM | POA: Diagnosis not present

## 2020-07-04 DIAGNOSIS — G309 Alzheimer's disease, unspecified: Secondary | ICD-10-CM | POA: Diagnosis not present

## 2020-07-04 DIAGNOSIS — L89152 Pressure ulcer of sacral region, stage 2: Secondary | ICD-10-CM | POA: Diagnosis not present

## 2020-07-04 DIAGNOSIS — E118 Type 2 diabetes mellitus with unspecified complications: Secondary | ICD-10-CM | POA: Diagnosis not present

## 2020-07-04 DIAGNOSIS — F329 Major depressive disorder, single episode, unspecified: Secondary | ICD-10-CM | POA: Diagnosis not present

## 2020-07-05 ENCOUNTER — Telehealth: Payer: Self-pay

## 2020-07-05 NOTE — Telephone Encounter (Signed)
Spoke with Mickel Baas, RN from Hospice and she stated that the patient's sacral and right heel wound are causing her a lot of pain. Due to the patient being bed bound she stated that her sacral wound is starting to tunnel, she is controlling the odor with Flagyl. Mr. Danser gave her Tramadol 50 mg by mouth Monday night and that seemed to help the patient with her pain. She stated that she did advise the family to schedule Tylenol 3-4 times. RN stated that if we send a script to the patient's pharmacy to write "Hospice Patient" that way the cost is covered.

## 2020-07-05 NOTE — Telephone Encounter (Signed)
Are they asking for tramadol refill? Couldn't quite tell from note

## 2020-07-05 NOTE — Telephone Encounter (Signed)
Hospice nurse is wondering would you be willing to refill the patient's Tramadol.

## 2020-07-06 MED ORDER — TRAMADOL HCL 50 MG PO TABS: 25.0000 mg | ORAL_TABLET | Freq: Four times a day (QID) | ORAL | 0 refills | Status: AC | PRN

## 2020-07-06 NOTE — Addendum Note (Signed)
Addended by: Marin Olp on: 07/06/2020 07:53 AM   Modules accepted: Orders

## 2020-07-06 NOTE — Telephone Encounter (Signed)
I sent in tramadol

## 2020-07-08 DIAGNOSIS — F0280 Dementia in other diseases classified elsewhere without behavioral disturbance: Secondary | ICD-10-CM | POA: Diagnosis not present

## 2020-07-08 DIAGNOSIS — E118 Type 2 diabetes mellitus with unspecified complications: Secondary | ICD-10-CM | POA: Diagnosis not present

## 2020-07-08 DIAGNOSIS — G309 Alzheimer's disease, unspecified: Secondary | ICD-10-CM | POA: Diagnosis not present

## 2020-07-08 DIAGNOSIS — Z8616 Personal history of COVID-19: Secondary | ICD-10-CM | POA: Diagnosis not present

## 2020-07-08 DIAGNOSIS — F329 Major depressive disorder, single episode, unspecified: Secondary | ICD-10-CM | POA: Diagnosis not present

## 2020-07-08 DIAGNOSIS — L89152 Pressure ulcer of sacral region, stage 2: Secondary | ICD-10-CM | POA: Diagnosis not present

## 2020-07-11 DIAGNOSIS — F0280 Dementia in other diseases classified elsewhere without behavioral disturbance: Secondary | ICD-10-CM | POA: Diagnosis not present

## 2020-07-11 DIAGNOSIS — Z8616 Personal history of COVID-19: Secondary | ICD-10-CM | POA: Diagnosis not present

## 2020-07-11 DIAGNOSIS — G309 Alzheimer's disease, unspecified: Secondary | ICD-10-CM | POA: Diagnosis not present

## 2020-07-11 DIAGNOSIS — E118 Type 2 diabetes mellitus with unspecified complications: Secondary | ICD-10-CM | POA: Diagnosis not present

## 2020-07-11 DIAGNOSIS — F329 Major depressive disorder, single episode, unspecified: Secondary | ICD-10-CM | POA: Diagnosis not present

## 2020-07-11 DIAGNOSIS — L89152 Pressure ulcer of sacral region, stage 2: Secondary | ICD-10-CM | POA: Diagnosis not present

## 2020-07-12 ENCOUNTER — Other Ambulatory Visit: Payer: Self-pay | Admitting: Family Medicine

## 2020-07-12 ENCOUNTER — Other Ambulatory Visit: Payer: Self-pay | Admitting: *Deleted

## 2020-07-14 ENCOUNTER — Ambulatory Visit: Payer: Medicare Other | Admitting: *Deleted

## 2020-07-14 DIAGNOSIS — E118 Type 2 diabetes mellitus with unspecified complications: Secondary | ICD-10-CM | POA: Diagnosis not present

## 2020-07-14 DIAGNOSIS — F0280 Dementia in other diseases classified elsewhere without behavioral disturbance: Secondary | ICD-10-CM | POA: Diagnosis not present

## 2020-07-14 DIAGNOSIS — F329 Major depressive disorder, single episode, unspecified: Secondary | ICD-10-CM | POA: Diagnosis not present

## 2020-07-14 DIAGNOSIS — L89152 Pressure ulcer of sacral region, stage 2: Secondary | ICD-10-CM | POA: Diagnosis not present

## 2020-07-14 DIAGNOSIS — G309 Alzheimer's disease, unspecified: Secondary | ICD-10-CM | POA: Diagnosis not present

## 2020-07-14 DIAGNOSIS — Z8616 Personal history of COVID-19: Secondary | ICD-10-CM | POA: Diagnosis not present

## 2020-07-17 ENCOUNTER — Telehealth: Payer: Self-pay | Admitting: Family Medicine

## 2020-07-17 DIAGNOSIS — E118 Type 2 diabetes mellitus with unspecified complications: Secondary | ICD-10-CM | POA: Diagnosis not present

## 2020-07-17 DIAGNOSIS — F329 Major depressive disorder, single episode, unspecified: Secondary | ICD-10-CM | POA: Diagnosis not present

## 2020-07-17 DIAGNOSIS — L89152 Pressure ulcer of sacral region, stage 2: Secondary | ICD-10-CM | POA: Diagnosis not present

## 2020-07-17 DIAGNOSIS — Z8616 Personal history of COVID-19: Secondary | ICD-10-CM | POA: Diagnosis not present

## 2020-07-17 DIAGNOSIS — F0280 Dementia in other diseases classified elsewhere without behavioral disturbance: Secondary | ICD-10-CM | POA: Diagnosis not present

## 2020-07-17 DIAGNOSIS — G309 Alzheimer's disease, unspecified: Secondary | ICD-10-CM | POA: Diagnosis not present

## 2020-07-17 MED ORDER — MORPHINE SULFATE 10 MG/5ML PO SOLN: 2.5000 mg | ORAL | 0 refills | Status: AC | PRN

## 2020-07-17 NOTE — Telephone Encounter (Signed)
Spoke with [name] and she stated that she did talk to [name] and he feels that she is in the process of dying. [name] just left the house and she stated that [name] was eating ice cream when she left. Family does not want to take her to the hospital at this time and they would like for her to be comfortable. [name] and the family were hoping that liquid morphine could be prescribed due to her not being able to swallow a bunch of pills. No signs of infection that [name] could see. Please advise

## 2020-07-17 NOTE — Telephone Encounter (Signed)
I sent in morphine- please coordinate with hospice. If hospice has another preference on rx let me know- please have them guide family on suggested dosing  Do they still want to consider antibiotics? If in process of dying and for her overall comfort prefer not to stop that process- reasonable to stop antibiotics as well

## 2020-07-17 NOTE — Telephone Encounter (Signed)
Patient could be septic and in the process of dying from sepsis/potentially treatable condition.  I assume in requesting this that family/hospice does not want to take her to the hospital to evaluate further.  I do not recall her family has signed a MOST form stating they want to keep her out of the hospital and would need to know that first.   If choose home treatment Also any direction on where potential infection could be- rash, respiratory issues, burning with peeing/UTI?

## 2020-07-17 NOTE — Telephone Encounter (Signed)
Called and lm on Maura vm tcb, please provide below message when she calls back.

## 2020-07-17 NOTE — Telephone Encounter (Signed)
Spoke with Cristela Blue and gave her the instructions for Morphine that Dr. Yong Channel has ordered for the patient. She stated that she would keep [place] posted and if any other medication suggestions come up that she would give our office a call.

## 2020-07-17 NOTE — Telephone Encounter (Signed)
Maura from [facility] called regarding a change in pt condition. Pt has sudden and concerning changes including: fever (axillary temp 109 degrees), blood sugar was 255, lethargic & minimally responsive, heart rate 151, respirations 24. Cristela Blue is asking if Dr. Yong Channel can prescribe a liquid antibiotic or medication to help bring down pt heart rate and relieve symptoms. Please return call ASAP.

## 2020-07-18 DIAGNOSIS — F329 Major depressive disorder, single episode, unspecified: Secondary | ICD-10-CM | POA: Diagnosis not present

## 2020-07-18 DIAGNOSIS — E118 Type 2 diabetes mellitus with unspecified complications: Secondary | ICD-10-CM | POA: Diagnosis not present

## 2020-07-18 DIAGNOSIS — G309 Alzheimer's disease, unspecified: Secondary | ICD-10-CM | POA: Diagnosis not present

## 2020-07-18 DIAGNOSIS — Z8616 Personal history of COVID-19: Secondary | ICD-10-CM | POA: Diagnosis not present

## 2020-07-18 DIAGNOSIS — F0280 Dementia in other diseases classified elsewhere without behavioral disturbance: Secondary | ICD-10-CM | POA: Diagnosis not present

## 2020-07-18 DIAGNOSIS — L89152 Pressure ulcer of sacral region, stage 2: Secondary | ICD-10-CM | POA: Diagnosis not present

## 2020-07-20 DIAGNOSIS — F0280 Dementia in other diseases classified elsewhere without behavioral disturbance: Secondary | ICD-10-CM | POA: Diagnosis not present

## 2020-07-20 DIAGNOSIS — L89152 Pressure ulcer of sacral region, stage 2: Secondary | ICD-10-CM | POA: Diagnosis not present

## 2020-07-20 DIAGNOSIS — F329 Major depressive disorder, single episode, unspecified: Secondary | ICD-10-CM | POA: Diagnosis not present

## 2020-07-20 DIAGNOSIS — E118 Type 2 diabetes mellitus with unspecified complications: Secondary | ICD-10-CM | POA: Diagnosis not present

## 2020-07-20 DIAGNOSIS — G309 Alzheimer's disease, unspecified: Secondary | ICD-10-CM | POA: Diagnosis not present

## 2020-07-20 DIAGNOSIS — Z8616 Personal history of COVID-19: Secondary | ICD-10-CM | POA: Diagnosis not present

## 2020-07-21 DIAGNOSIS — R32 Unspecified urinary incontinence: Secondary | ICD-10-CM | POA: Diagnosis not present

## 2020-07-21 DIAGNOSIS — F329 Major depressive disorder, single episode, unspecified: Secondary | ICD-10-CM | POA: Diagnosis not present

## 2020-07-21 DIAGNOSIS — K219 Gastro-esophageal reflux disease without esophagitis: Secondary | ICD-10-CM | POA: Diagnosis not present

## 2020-07-21 DIAGNOSIS — R159 Full incontinence of feces: Secondary | ICD-10-CM | POA: Diagnosis not present

## 2020-07-21 DIAGNOSIS — J449 Chronic obstructive pulmonary disease, unspecified: Secondary | ICD-10-CM | POA: Diagnosis not present

## 2020-07-21 DIAGNOSIS — L89152 Pressure ulcer of sacral region, stage 2: Secondary | ICD-10-CM | POA: Diagnosis not present

## 2020-07-21 DIAGNOSIS — E118 Type 2 diabetes mellitus with unspecified complications: Secondary | ICD-10-CM | POA: Diagnosis not present

## 2020-07-21 DIAGNOSIS — Z741 Need for assistance with personal care: Secondary | ICD-10-CM | POA: Diagnosis not present

## 2020-07-21 DIAGNOSIS — G309 Alzheimer's disease, unspecified: Secondary | ICD-10-CM | POA: Diagnosis not present

## 2020-07-21 DIAGNOSIS — Z8616 Personal history of COVID-19: Secondary | ICD-10-CM | POA: Diagnosis not present

## 2020-07-21 DIAGNOSIS — I10 Essential (primary) hypertension: Secondary | ICD-10-CM | POA: Diagnosis not present

## 2020-07-21 DIAGNOSIS — Z681 Body mass index (BMI) 19 or less, adult: Secondary | ICD-10-CM | POA: Diagnosis not present

## 2020-07-21 DIAGNOSIS — F0280 Dementia in other diseases classified elsewhere without behavioral disturbance: Secondary | ICD-10-CM | POA: Diagnosis not present

## 2020-07-21 DIAGNOSIS — H409 Unspecified glaucoma: Secondary | ICD-10-CM | POA: Diagnosis not present

## 2020-07-23 DIAGNOSIS — Z8616 Personal history of COVID-19: Secondary | ICD-10-CM | POA: Diagnosis not present

## 2020-07-23 DIAGNOSIS — E118 Type 2 diabetes mellitus with unspecified complications: Secondary | ICD-10-CM | POA: Diagnosis not present

## 2020-07-23 DIAGNOSIS — L89152 Pressure ulcer of sacral region, stage 2: Secondary | ICD-10-CM | POA: Diagnosis not present

## 2020-07-23 DIAGNOSIS — F0280 Dementia in other diseases classified elsewhere without behavioral disturbance: Secondary | ICD-10-CM | POA: Diagnosis not present

## 2020-07-23 DIAGNOSIS — F329 Major depressive disorder, single episode, unspecified: Secondary | ICD-10-CM | POA: Diagnosis not present

## 2020-07-23 DIAGNOSIS — G309 Alzheimer's disease, unspecified: Secondary | ICD-10-CM | POA: Diagnosis not present

## 2020-07-24 ENCOUNTER — Telehealth: Payer: Self-pay

## 2020-07-24 NOTE — Telephone Encounter (Signed)
Received death certificate from [place] for pt. Gave DC to CMA.

## 2020-07-24 NOTE — Telephone Encounter (Signed)
Noted  

## 2020-08-21 DEATH — deceased

## 2021-06-17 IMAGING — DX DG CHEST 2V
2 series · 2 of 2 positions shown · non-contrast
Comparison: 07/21/2018

CLINICAL DATA: Altered mental status.

EXAM:
CHEST - 2 VIEW

[chest lat]
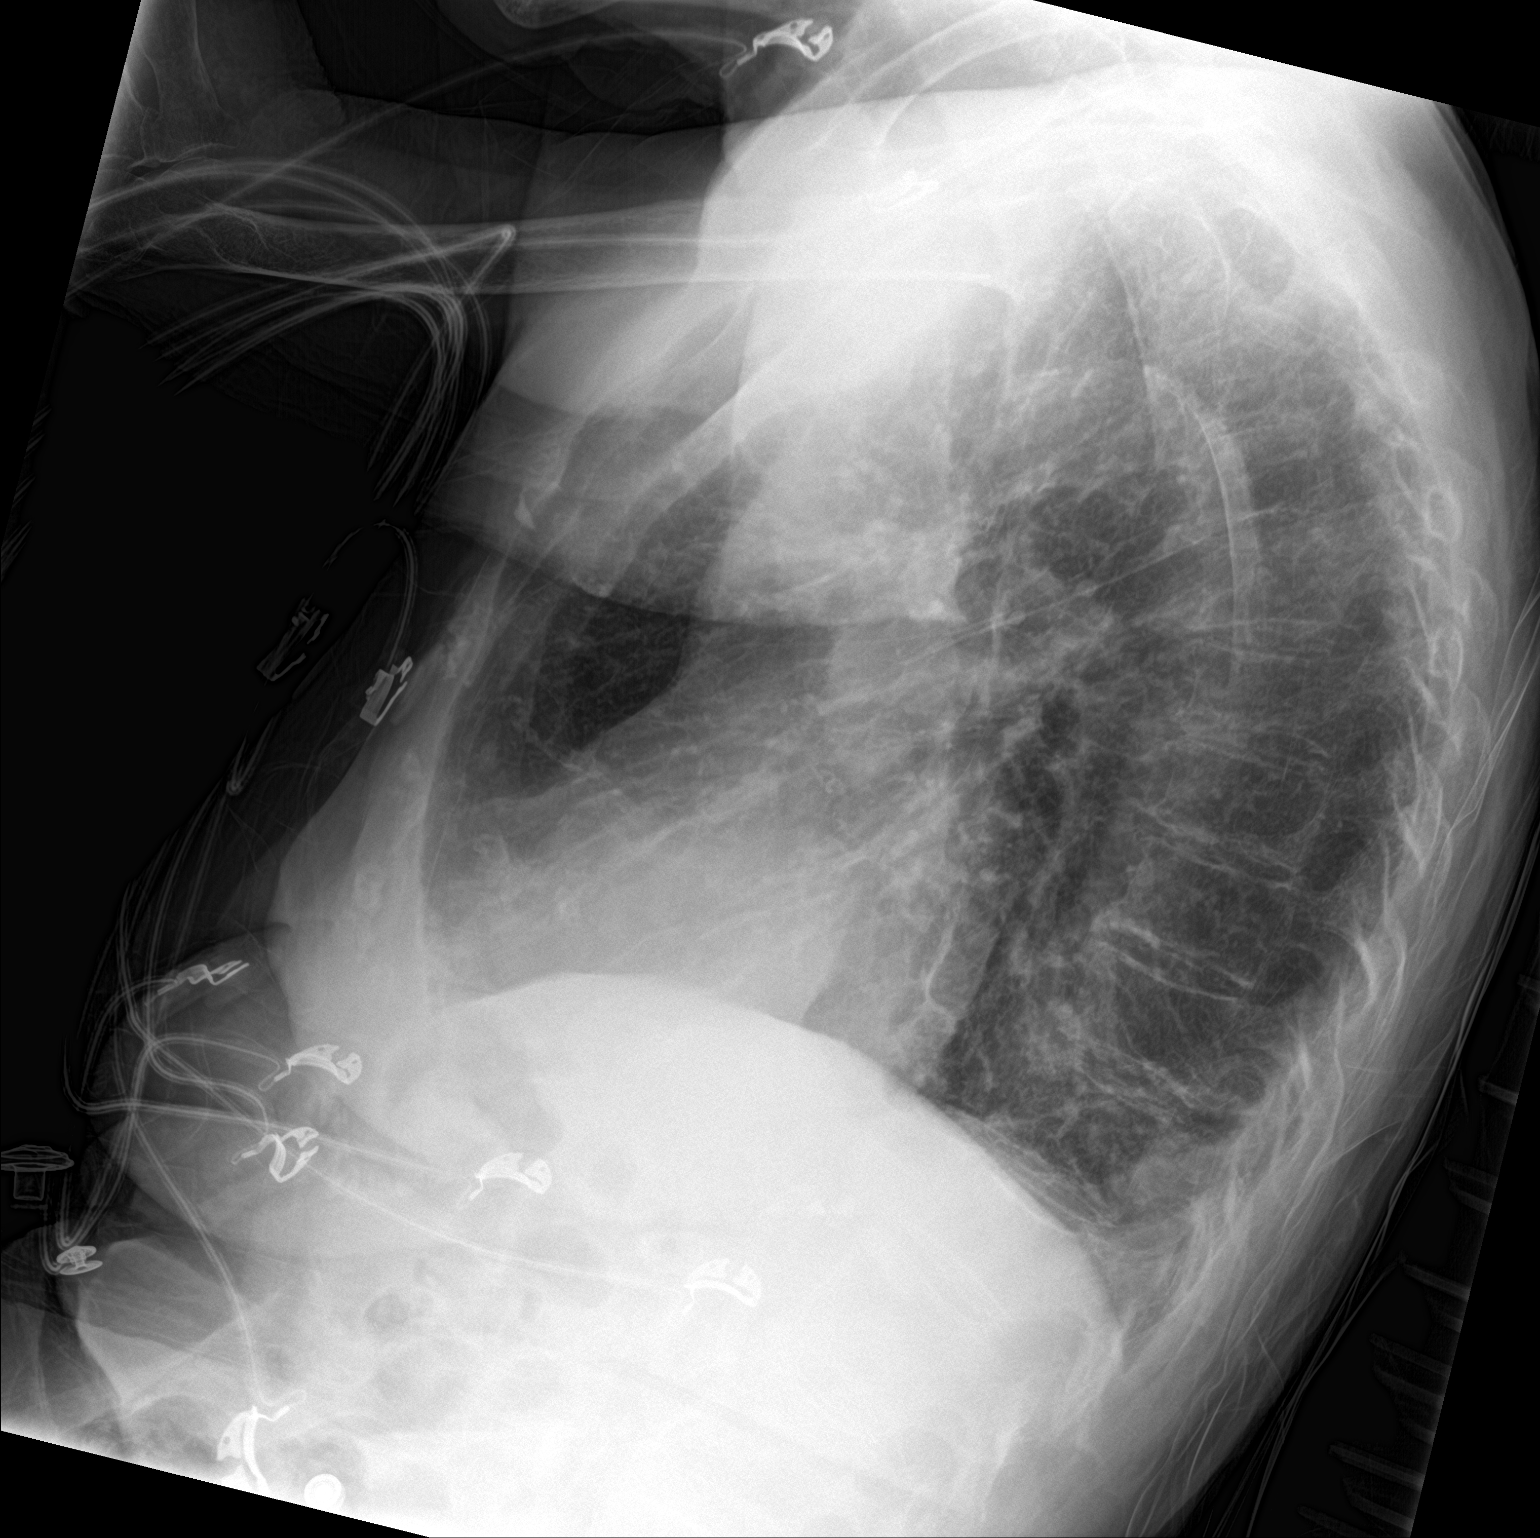

[chest ap strecther]
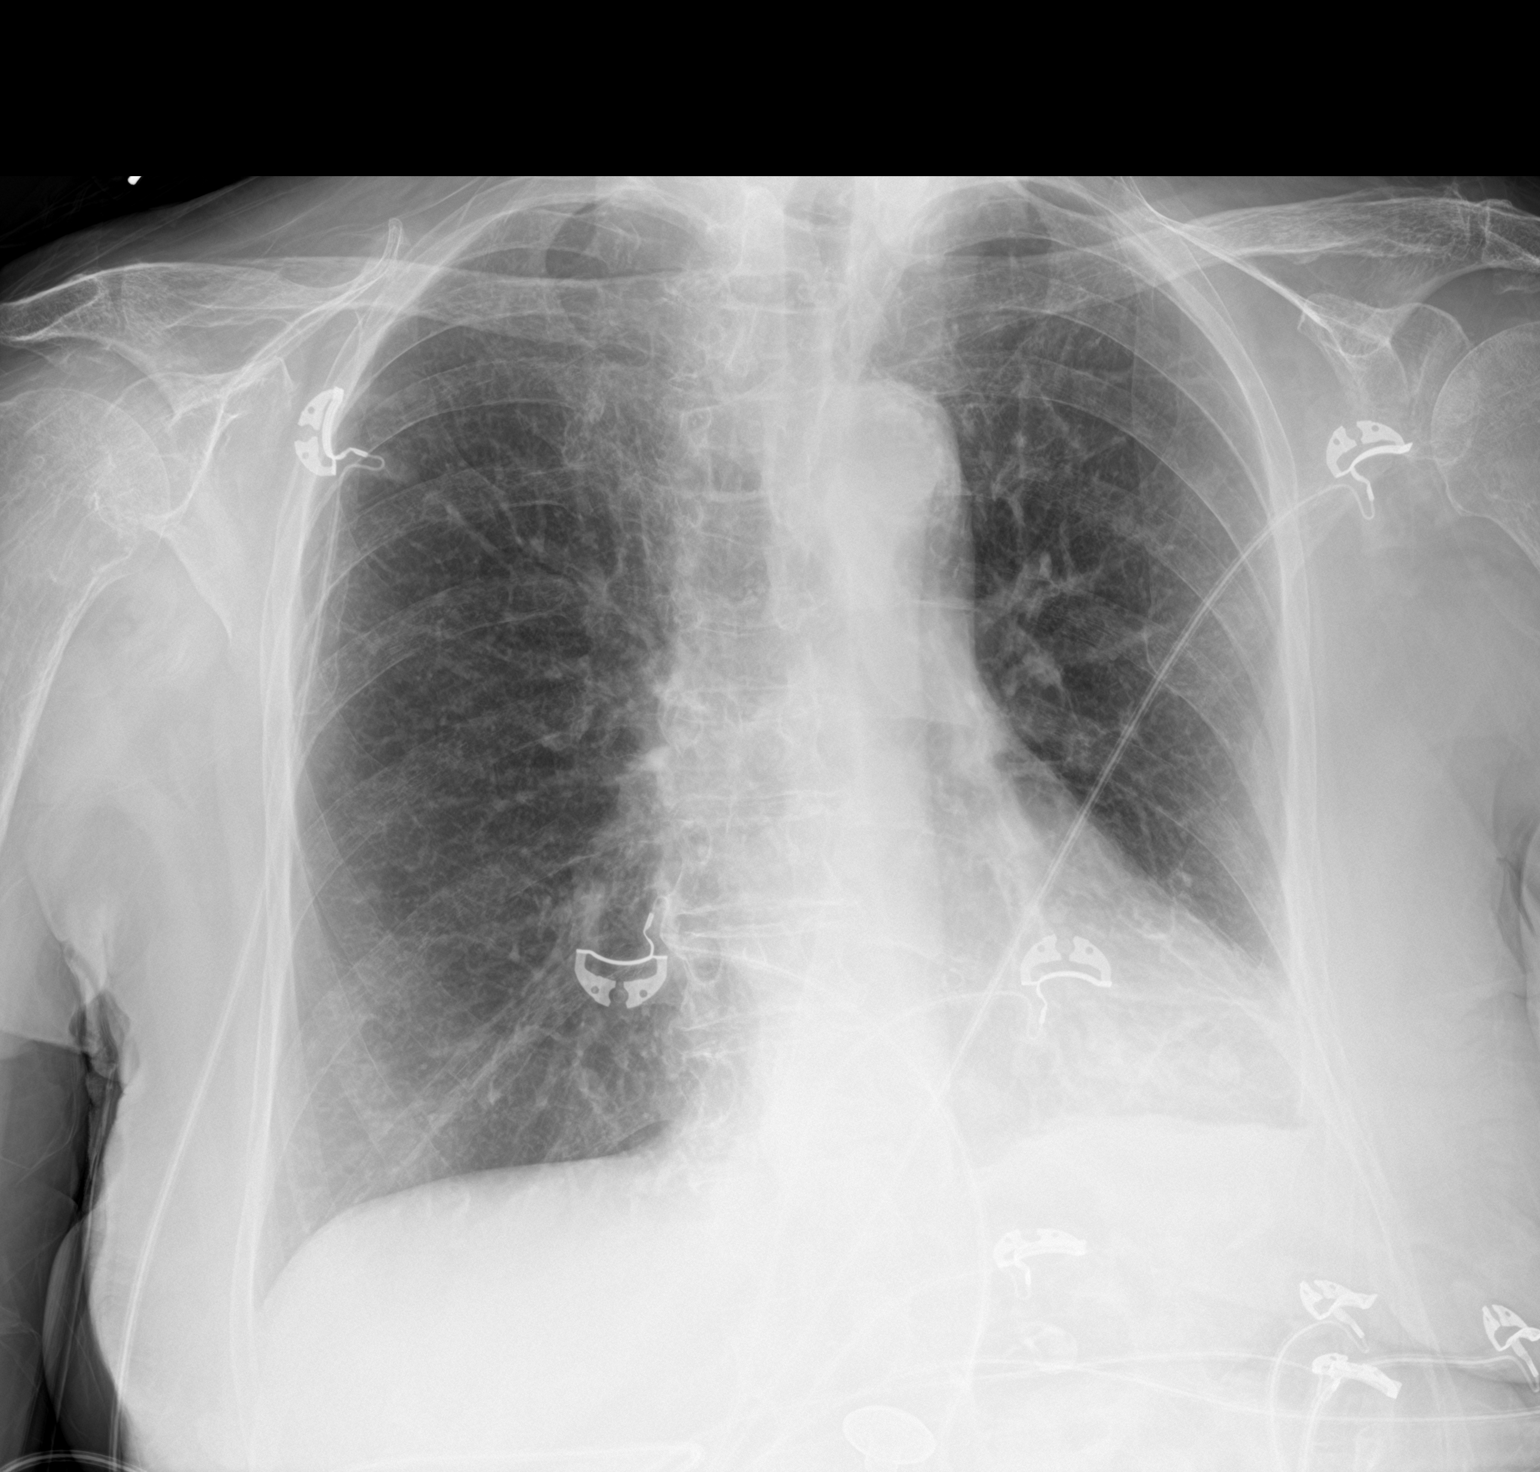

[2 of 2 positions shown; findings below may reference images not displayed]

FINDINGS: The cardiomediastinal silhouette is unchanged with borderline
cardiomegaly. The lungs remain hyperinflated with chronic
interstitial coarsening. Mild asymmetric opacities at the left lung
base are similar to the prior study and likely reflect scarring.
There is a persistent small left pleural effusion. No acute airspace
consolidation, edema, or pneumothorax is identified. No acute
osseous abnormality is seen.
IMPRESSION: COPD with an unchanged small left pleural effusion. No evidence of
acute airspace disease.
